# Patient Record
Sex: Female | Born: 1950
Health system: Southern US, Community
[De-identification: ages and names within clinical notes are randomized; demographics above are authoritative.]

## PROBLEM LIST (undated history)

## (undated) DIAGNOSIS — E669 Obesity, unspecified: Secondary | ICD-10-CM

## (undated) DIAGNOSIS — Z720 Tobacco use: Secondary | ICD-10-CM

## (undated) DIAGNOSIS — Z7189 Other specified counseling: Secondary | ICD-10-CM

## (undated) DIAGNOSIS — M549 Dorsalgia, unspecified: Secondary | ICD-10-CM

## (undated) DIAGNOSIS — K219 Gastro-esophageal reflux disease without esophagitis: Secondary | ICD-10-CM

## (undated) DIAGNOSIS — M4800 Spinal stenosis, site unspecified: Secondary | ICD-10-CM

## (undated) DIAGNOSIS — I878 Other specified disorders of veins: Secondary | ICD-10-CM

## (undated) DIAGNOSIS — R1310 Dysphagia, unspecified: Secondary | ICD-10-CM

## (undated) DIAGNOSIS — R51 Headache: Secondary | ICD-10-CM

## (undated) DIAGNOSIS — I2699 Other pulmonary embolism without acute cor pulmonale: Secondary | ICD-10-CM

## (undated) DIAGNOSIS — Z7901 Long term (current) use of anticoagulants: Secondary | ICD-10-CM

## (undated) DIAGNOSIS — C801 Malignant (primary) neoplasm, unspecified: Secondary | ICD-10-CM

## (undated) DIAGNOSIS — I480 Paroxysmal atrial fibrillation: Secondary | ICD-10-CM

## (undated) DIAGNOSIS — I82409 Acute embolism and thrombosis of unspecified deep veins of unspecified lower extremity: Secondary | ICD-10-CM

## (undated) DIAGNOSIS — R112 Nausea with vomiting, unspecified: Secondary | ICD-10-CM

## (undated) DIAGNOSIS — G4730 Sleep apnea, unspecified: Secondary | ICD-10-CM

## (undated) DIAGNOSIS — Z9889 Other specified postprocedural states: Secondary | ICD-10-CM

## (undated) DIAGNOSIS — F419 Anxiety disorder, unspecified: Secondary | ICD-10-CM

## (undated) DIAGNOSIS — M1990 Unspecified osteoarthritis, unspecified site: Secondary | ICD-10-CM

## (undated) DIAGNOSIS — IMO0001 Reserved for inherently not codable concepts without codable children: Secondary | ICD-10-CM

## (undated) DIAGNOSIS — G8929 Other chronic pain: Secondary | ICD-10-CM

## (undated) DIAGNOSIS — E2839 Other primary ovarian failure: Secondary | ICD-10-CM

## (undated) HISTORY — PX: TUBAL LIGATION: SHX77

## (undated) HISTORY — PX: APPENDECTOMY: SHX54

## (undated) HISTORY — DX: Dysphagia, unspecified: R13.10

## (undated) HISTORY — DX: Reserved for inherently not codable concepts without codable children: IMO0001

## (undated) HISTORY — DX: Other specified counseling: Z71.89

## (undated) HISTORY — DX: Headache: R51

## (undated) HISTORY — PX: CHOLECYSTECTOMY: SHX55

## (undated) HISTORY — DX: Gastro-esophageal reflux disease without esophagitis: K21.9

## (undated) HISTORY — PX: ROTATOR CUFF REPAIR: SHX139

## (undated) HISTORY — DX: Paroxysmal atrial fibrillation: I48.0

## (undated) HISTORY — PX: CARPAL TUNNEL RELEASE: SHX101

## (undated) HISTORY — DX: Other specified disorders of veins: I87.8

## (undated) HISTORY — PX: OVARIAN CYST REMOVAL: SHX89

## (undated) HISTORY — DX: Sleep apnea, unspecified: G47.30

## (undated) HISTORY — DX: Other primary ovarian failure: E28.39

## (undated) HISTORY — PX: ANKLE SURGERY: SHX546

---

## 1998-12-27 ENCOUNTER — Ambulatory Visit (HOSPITAL_COMMUNITY): Admission: RE | Admit: 1998-12-27 | Discharge: 1998-12-27 | Payer: Self-pay | Admitting: Orthopaedic Surgery

## 2000-10-19 ENCOUNTER — Ambulatory Visit (HOSPITAL_COMMUNITY): Admission: RE | Admit: 2000-10-19 | Discharge: 2000-10-19 | Payer: Self-pay | Admitting: Family Medicine

## 2000-10-19 ENCOUNTER — Encounter: Payer: Self-pay | Admitting: Family Medicine

## 2001-03-25 ENCOUNTER — Ambulatory Visit (HOSPITAL_COMMUNITY): Admission: RE | Admit: 2001-03-25 | Discharge: 2001-03-25 | Payer: Self-pay | Admitting: Family Medicine

## 2001-03-25 ENCOUNTER — Encounter: Payer: Self-pay | Admitting: Family Medicine

## 2001-04-08 ENCOUNTER — Ambulatory Visit (HOSPITAL_COMMUNITY): Admission: RE | Admit: 2001-04-08 | Discharge: 2001-04-08 | Payer: Self-pay | Admitting: Family Medicine

## 2001-04-08 ENCOUNTER — Encounter: Payer: Self-pay | Admitting: Family Medicine

## 2001-04-17 ENCOUNTER — Other Ambulatory Visit: Admission: RE | Admit: 2001-04-17 | Discharge: 2001-04-17 | Payer: Self-pay | Admitting: Radiology

## 2001-05-07 ENCOUNTER — Encounter: Admission: RE | Admit: 2001-05-07 | Discharge: 2001-05-07 | Payer: Self-pay | Admitting: General Surgery

## 2001-05-07 ENCOUNTER — Ambulatory Visit (HOSPITAL_BASED_OUTPATIENT_CLINIC_OR_DEPARTMENT_OTHER): Admission: RE | Admit: 2001-05-07 | Discharge: 2001-05-07 | Payer: Self-pay | Admitting: General Surgery

## 2001-05-07 ENCOUNTER — Encounter (INDEPENDENT_AMBULATORY_CARE_PROVIDER_SITE_OTHER): Payer: Self-pay | Admitting: *Deleted

## 2001-05-07 ENCOUNTER — Encounter: Payer: Self-pay | Admitting: General Surgery

## 2001-07-12 ENCOUNTER — Encounter (INDEPENDENT_AMBULATORY_CARE_PROVIDER_SITE_OTHER): Payer: Self-pay | Admitting: Specialist

## 2001-07-12 ENCOUNTER — Ambulatory Visit (HOSPITAL_BASED_OUTPATIENT_CLINIC_OR_DEPARTMENT_OTHER): Admission: RE | Admit: 2001-07-12 | Discharge: 2001-07-12 | Payer: Self-pay | Admitting: General Surgery

## 2001-09-12 ENCOUNTER — Encounter: Payer: Self-pay | Admitting: Family Medicine

## 2001-09-12 ENCOUNTER — Ambulatory Visit (HOSPITAL_COMMUNITY): Admission: RE | Admit: 2001-09-12 | Discharge: 2001-09-12 | Payer: Self-pay | Admitting: Family Medicine

## 2001-11-26 ENCOUNTER — Ambulatory Visit (HOSPITAL_COMMUNITY): Admission: RE | Admit: 2001-11-26 | Discharge: 2001-11-26 | Payer: Self-pay | Admitting: Internal Medicine

## 2001-12-11 ENCOUNTER — Encounter: Payer: Self-pay | Admitting: Orthopaedic Surgery

## 2001-12-11 ENCOUNTER — Ambulatory Visit (HOSPITAL_COMMUNITY): Admission: RE | Admit: 2001-12-11 | Discharge: 2001-12-11 | Payer: Self-pay | Admitting: Orthopaedic Surgery

## 2002-04-07 ENCOUNTER — Encounter: Payer: Self-pay | Admitting: Family Medicine

## 2002-04-07 ENCOUNTER — Ambulatory Visit (HOSPITAL_COMMUNITY): Admission: RE | Admit: 2002-04-07 | Discharge: 2002-04-07 | Payer: Self-pay | Admitting: Family Medicine

## 2003-01-22 ENCOUNTER — Ambulatory Visit (HOSPITAL_COMMUNITY): Admission: RE | Admit: 2003-01-22 | Discharge: 2003-01-22 | Payer: Self-pay | Admitting: Family Medicine

## 2003-01-22 ENCOUNTER — Encounter: Payer: Self-pay | Admitting: Family Medicine

## 2003-02-05 ENCOUNTER — Encounter (HOSPITAL_COMMUNITY): Admission: RE | Admit: 2003-02-05 | Discharge: 2003-03-07 | Payer: Self-pay | Admitting: Family Medicine

## 2003-05-08 ENCOUNTER — Ambulatory Visit (HOSPITAL_COMMUNITY): Admission: RE | Admit: 2003-05-08 | Discharge: 2003-05-08 | Payer: Self-pay | Admitting: Family Medicine

## 2004-10-04 ENCOUNTER — Ambulatory Visit (HOSPITAL_COMMUNITY): Admission: RE | Admit: 2004-10-04 | Discharge: 2004-10-04 | Payer: Self-pay | Admitting: Family Medicine

## 2005-06-21 ENCOUNTER — Ambulatory Visit (HOSPITAL_COMMUNITY): Admission: RE | Admit: 2005-06-21 | Discharge: 2005-06-21 | Payer: Self-pay | Admitting: Family Medicine

## 2006-06-15 ENCOUNTER — Encounter: Admission: RE | Admit: 2006-06-15 | Discharge: 2006-06-15 | Payer: Self-pay | Admitting: Orthopaedic Surgery

## 2006-10-03 ENCOUNTER — Encounter: Admission: RE | Admit: 2006-10-03 | Discharge: 2006-10-03 | Payer: Self-pay | Admitting: Orthopaedic Surgery

## 2006-10-19 ENCOUNTER — Encounter: Admission: RE | Admit: 2006-10-19 | Discharge: 2006-10-19 | Payer: Self-pay | Admitting: Orthopaedic Surgery

## 2006-10-30 ENCOUNTER — Ambulatory Visit (HOSPITAL_COMMUNITY): Admission: RE | Admit: 2006-10-30 | Discharge: 2006-10-30 | Payer: Self-pay | Admitting: Family Medicine

## 2006-11-12 ENCOUNTER — Inpatient Hospital Stay (HOSPITAL_COMMUNITY): Admission: RE | Admit: 2006-11-12 | Discharge: 2006-11-16 | Payer: Self-pay | Admitting: Family Medicine

## 2006-12-11 ENCOUNTER — Ambulatory Visit (HOSPITAL_COMMUNITY): Payer: Self-pay | Admitting: Oncology

## 2007-07-17 ENCOUNTER — Ambulatory Visit (HOSPITAL_COMMUNITY): Admission: RE | Admit: 2007-07-17 | Discharge: 2007-07-17 | Payer: Self-pay | Admitting: Otolaryngology

## 2007-10-09 ENCOUNTER — Ambulatory Visit (HOSPITAL_COMMUNITY): Admission: RE | Admit: 2007-10-09 | Discharge: 2007-10-09 | Payer: Self-pay | Admitting: Family Medicine

## 2007-11-15 ENCOUNTER — Ambulatory Visit (HOSPITAL_COMMUNITY): Admission: RE | Admit: 2007-11-15 | Discharge: 2007-11-15 | Payer: Self-pay | Admitting: Family Medicine

## 2010-09-13 NOTE — H&P (Signed)
Sarah [name], Sarah [name]               ACCOUNT NO.:  [id number]   MEDICAL RECORD NO.:  [id number]          PATIENT TYPE:  INP   LOCATION:  A206                          FACILITY:  [facility]   PHYSICIAN:  Donna Bernard, M.D.DATE OF BIRTH:  [birth year]   DATE OF ADMISSION:  11/12/2006  DATE OF DISCHARGE:  LH                              HISTORY & PHYSICAL   CHIEF COMPLAINT:  Leg pain.   SUBJECTIVE:  This patient is a 60 year old white female with a history  of arthritis, chronic left leg venous stasis, reflux, old DVT  progressing to pulmonary embolus at age 78, who presents to the office  the day of admission with ongoing leg symptoms.  The patient was seen a  couple weeks prior.  She had left leg swelling and tenderness at that  time.  A D-dimer was elevated.  The patient had an ultrasound done.  This showed no evidence of clot.  She was placed on antibiotics to cover  for the potential for cellulitis.  The patient returns to the office  today, states she is having further discomfort, the swelling does not  seem to be improved at all.  She has finished her course of antibiotics.  No chest pain, no shortness of breath, no cough.  The patient claims  compliance with all her medicines which include:  1. Darvocet one b.i.d. p.r.n. for pain.  2. Vicodin one q.6h. p.r.n. for pain.  3. Nexium 40 mg daily.  4. Relafen 750 one b.i.d.  5. Wellbutrin SR 150 one b.i.d.  6. Tums p.r.n.   PRIOR MEDICAL HISTORY:  Significant for chronic problems as noted above  plus reflux.   PRIOR SURGERIES:  1. Remote cholecystectomy.  2. Remote appendectomy.  3. Multiple ankle surgeries.   FAMILY HISTORY:  Positive for colon cancer, coronary artery disease.   ALLERGIES:  None.   SOCIAL:  The patient is a dialysis tech.  Does smoke approximately half  a pack a day.  No alcohol abuse.  Two children.   REVIEW OF SYSTEMS:  Otherwise negative.   BP 130/60.  Alert, no acute distress.  HEENT:  Normal.  NECK:   Supple.  LUNGS:  Clear.  No tachypnea.  HEART:  Regular rate and rhythm.  ABDOMEN:  Soft.  No tenderness, no rebound, no guarding.  LEFT LEG:  Impressive swelling, some distal erythema, moderate  tenderness posteriorly.  Denna Haggard' sign compromised by chronic ankle  tightness due to old fracture.   SIGNIFICANT LABORATORIES:  CBC, liver enzymes, MET-7, INR all normal.  Ultrasound:  DVT extending to midthigh.   IMPRESSION:  Deep venous thrombosis, progressive in nature, with history  of prior pulmonary embolus and likely hypercoagulable tendencies with  history of smoking, etcetera, along with prior deep venous thrombosis  many years ago.  I think the patient warrants inpatient management.   PLAN:  Lovenox injections, initiate Coumadin, strict bedrest, elevate  the leg.  Further orders as noted in the chart.      Donna Bernard, M.D.  Electronically Signed     WSL/MEDQ  D:  11/13/2006  T:  11/13/2006  Job:  [id number]

## 2010-09-13 NOTE — Discharge Summary (Signed)
NAMEHEIDE, Sarah [name]               ACCOUNT NO.:  [id number]   MEDICAL RECORD NO.:  [id number]           PATIENT TYPE:  INP   LOCATION:  A206                          FACILITY:  [facility]   PHYSICIAN:  Donna Bernard, M.D.DATE OF BIRTH:  [birth year]   DATE OF ADMISSION:  11/12/2006  DATE OF DISCHARGE:  07/18/2008LH                               DISCHARGE SUMMARY   FINAL DIAGNOSES:  1. Deep venous thrombosis.  2. History of pulmonary embolus.  3. Chronic arthritis.  4. Reflux.   FINAL DISPOSITION:  1. Patient discharged to home.  2. Maintain usual chronic medicines plus Coumadin 5 mg 1/2 daily.  3. Lovenox injection this evening.  4. Return to the office in several days for reevaluation and check of      INR.   INITIAL HISTORY AND PHYSICAL:  Please see H&P as dictated.   HOSPITAL COURSE:  This patient is a 60 year old white female with  history of prior pulmonary embolus who presented to the office the day  of admission with persistent leg swelling.  She had been seen a couple  weeks previous, had an elevated D-dimer at that time and negative  ultrasound.  The patient had ongoing pain and swelling.  We did the  proper studies, and DVT was evident.  The patient was admitted to  hospital.  She was started on Lovenox subcutaneous at appropriate doses.  Coumadin was also initiated.  Over the next several days with leg  elevation, the patient noted diminishment in overall swelling.  She  continued to improve in terms of discomfort in her leg.  The day of  discharge, the patient was approaching therapeutic level.  Therefore,  she was sent home with diagnosis and disposition as noted above.      Donna Bernard, M.D.  Electronically Signed     WSL/MEDQ  D:  12/19/2006  T:  12/20/2006  Job:  [id number]

## 2010-09-16 NOTE — Op Note (Signed)
[place]. [facility]  Patient:    Sarah [name], Sarah [name] Visit Number: [id number] MRN: [id number]          Service Type: OUT Location: RAD Attending Physician:  Harlow Asa Dictated by:   Lorne Skeens Hoxworth, M.D. Proc. Date: 07/12/01                             Operative Report  PREOPERATIVE DIAGNOSES:  Recurrent areolar abscess and skin sinus.  POSTOPERATIVE DIAGNOSES:  Recurrent areolar abscess and skin sinus.  SURGICAL PROCEDURE:  Excision of chronic areolar abscess.  SURGEON:  Lorne Skeens. Hoxworth, M.D.  ANESTHESIA:  Local with IV sedation.  BRIEF HISTORY:  [name] is a 60 year old white female who has undergone I&D and debridement of an areolar abscess about two months ago. She now has presented with recurrent abscess requiring I&D in the office and has a small draining sinus at the inferior aspect of the left areolar. Excision of this area has been recommended and accepted. The nature of the procedure, indications, and risk of bleeding, infection and need to sacrifice some areolar skin have been discussed and understood. She is now brought to the operating room for this procedure.  DESCRIPTION OF PROCEDURE:  The patient was brought to the operating room, placed in supine position on the operating table and IV sedation was administered. She received preoperative antibiotics. The left breast was sterilely prepped and draped. In the inferior aspect of the left areolar just underneath the nipple was a chronic small draining sinus tract. The skin underlying tissue and nipple areolar complex were anesthetized. A curvilinear excision of the sinus tract back to healthy appearing skin up to the edge of the nipple and down toward the bottom of the areolar was performed. Dissection was deepened into the subcutaneous tissue. Dissection was carried down sharply into normal soft tissue deeply. Superiorly the nipple was undermined slightly and the ducts  taken up towards the nipple as well. This appeared to remove all abnormal tissue. The cavity was irrigated with antibiotic solution. The skin was then closed with interrupted 4-0 nylon mattress sutures. Sponge, needle and instrument counts were correct. Dry sterile dressing was applied and the patient taken to the recovery room in good condition. Dictated by:   Lorne Skeens. Hoxworth, M.D. Attending Physician:  Harlow Asa DD:  07/12/01 TD:  07/15/01 Job: [id number]

## 2010-09-16 NOTE — Op Note (Signed)
   NAME:  Sarah [name], Sarah [name]                         ACCOUNT NO.:  [id number]   MEDICAL RECORD NO.:  [id number]                   PATIENT TYPE:  AMB   LOCATION:  DAY                                  FACILITY:  [facility]   PHYSICIAN:  Gerrit Friends. Rourk, M.D.               DATE OF BIRTH:  [birth year]   DATE OF PROCEDURE:  11/26/2001  DATE OF DISCHARGE:  11/26/2001                                 OPERATIVE REPORT   PROCEDURE:  Screening colonoscopy.   INDICATIONS FOR PROCEDURE:  The patient is a 60 year old lady devoid of any  lower GI tract symptoms whose mother had colorectal cancer.  She never had a  lower GI tract imaging.  Colonoscopy is now being done to further evaluate  her symptoms.  This approach has been discussed with the patient at length.  Potential risks, benefits, and alternatives have been explained and  questions answered.  She is agreeable.  Please see my handwritten H&P for  more information.   DESCRIPTION OF PROCEDURE:  Oxygen saturation, blood pressure, pulse were  monitored during the entire procedure.  Conscious sedation of Versed 2 mg,  IV Demerol 50 mg IV.  The instrument Olympus pediatric colonoscope.  Findings:  Digital rectal examination revealed no abnormalities.  Endoscopic  findings:  The prep was good.  Rectum:  Examination of the rectal mucosa  including retroflexed view in the anal verge revealed no abnormalities.   Colon:  Colonic mucosa was surveyed from the rectosigmoid junction through  the left, transverse, right colon, appendiceal orifice, ileocecal valve, and  cecum.  These structures were well seen and photographed.  The colonic  mucosa in the sigmoid appeared normal from the level of the cecum and  ileocecal valve.  The scope was slowly withdrawn.  All previously mentioned  mucosal surfaces were again seen, and again no abnormalities were observed.  The patient tolerated the procedure well __________ .   IMPRESSION:  Normal rectum, normal colon.   Repeat colonoscopy in five years.                                               Gerrit Friends. Rourk, M.D.    RMR/MEDQ  D:  11/26/2001  T:  11/28/2001  Job:  [id number]   cc:   Gerrit Friends. Rourk, M.D.

## 2010-09-16 NOTE — Op Note (Signed)
[place]. [facility]  Patient:    Sarah [name], Sarah [name] Visit Number: [id number] MRN: [id number]          Service Type: DSU Location: [facility] Attending Physician:  Delsa Bern Dictated by:   Lorne Skeens. Hoxworth, M.D. Proc. Date: 05/07/01 Admit Date:  05/07/2001                             Operative Report  PREOPERATIVE DIAGNOSIS:  Chronic breast abscess.  POSTOPERATIVE DIAGNOSIS:  Chronic breast abscess.  OPERATION PERFORMED:  Excision of left breast abscess.  SURGEON:  Lorne Skeens. Hoxworth, M.D.  ANESTHESIA:  General.  INDICATIONS FOR PROCEDURE:  The patient is a 60 year old white female who has had an area of tenderness and a palpable lump and intermittent erythema in the left breast around the areolar border at the 5 oclock position.  Imaging studies and fine needle aspiration has been performed which is consistent with abscess.  It has not responded well to antibiotics.  Excision of this area has been recommended and accepted.  The nature of the procedure, its indications and risks of bleeding, infection and recurrent abscess were discussed and understood.  Patient is now brought to the operating room for this procedure.  DESCRIPTION OF PROCEDURE:  The patient was brought to the operating room and placed in supine position on the operating table and general endotracheal anesthesia was induced.  The left breast was sterilely prepped and draped. There was about a 1.5 cm palpable slightly fluctuant mass just inside the areolar edge at the 5 oclock position of the left breast.  There was a small skin opening overlying this in the areola.  I made a curvilinear incision directly over the palpable area and encompassed the thinned skin overlying it with the opening in the excision.  Dissection was carried down to the subcutaneous tissues.  The abscess cavity was entered and was cultured.  The small overlying skin and the underlying abscess cavity  was then completely excised down into healthy-appearing subcutaneous tissues sharply and with cautery.  Hemostasis was obtained with cautery.  Either end of the incision was closed with a single 5-0 nylon suture.  The central portion of the incision was left open and the cavity packed with moist saline gauze.  The soft tissue had been infiltrated with Marcaine.  Sponge, needle and instrument counts were correct.  Dry sterile dressings were applied.  The patient was transferred to the recovery room in good condition.. Dictated by:   Lorne Skeens. Hoxworth, M.D. Attending Physician:  Delsa Bern DD:  05/07/01 TD:  05/07/01 Job: [id number]

## 2010-10-03 ENCOUNTER — Other Ambulatory Visit: Payer: Self-pay | Admitting: Family Medicine

## 2010-10-03 DIAGNOSIS — M5430 Sciatica, unspecified side: Secondary | ICD-10-CM

## 2010-10-07 ENCOUNTER — Ambulatory Visit (HOSPITAL_COMMUNITY)
Admission: RE | Admit: 2010-10-07 | Discharge: 2010-10-07 | Disposition: A | Discharge status: Home / Self Care | Payer: Medicare Other | Source: Ambulatory Visit | Attending: Family Medicine | Admitting: Family Medicine

## 2010-10-07 DIAGNOSIS — M5126 Other intervertebral disc displacement, lumbar region: Secondary | ICD-10-CM | POA: Insufficient documentation

## 2010-10-07 DIAGNOSIS — M79609 Pain in unspecified limb: Secondary | ICD-10-CM | POA: Insufficient documentation

## 2010-10-07 DIAGNOSIS — M545 Low back pain, unspecified: Secondary | ICD-10-CM | POA: Insufficient documentation

## 2010-10-07 DIAGNOSIS — M5430 Sciatica, unspecified side: Secondary | ICD-10-CM

## 2010-11-17 ENCOUNTER — Other Ambulatory Visit: Payer: Self-pay | Admitting: Neurosurgery

## 2010-11-17 DIAGNOSIS — M549 Dorsalgia, unspecified: Secondary | ICD-10-CM

## 2010-11-17 DIAGNOSIS — M79606 Pain in leg, unspecified: Secondary | ICD-10-CM

## 2010-11-23 ENCOUNTER — Ambulatory Visit
Admission: RE | Admit: 2010-11-23 | Discharge: 2010-11-23 | Disposition: A | Discharge status: Home / Self Care | Payer: Medicare Other | Source: Ambulatory Visit | Attending: Neurosurgery | Admitting: Neurosurgery

## 2010-11-23 DIAGNOSIS — M549 Dorsalgia, unspecified: Secondary | ICD-10-CM

## 2010-11-23 DIAGNOSIS — M79606 Pain in leg, unspecified: Secondary | ICD-10-CM

## 2010-11-23 MED ORDER — IOHEXOL 180 MG/ML  SOLN
1.0000 mL | Freq: Once | INTRAMUSCULAR | Status: AC | PRN
  Administered 2010-11-23: 1 mL via EPIDURAL

## 2010-11-23 MED ORDER — METHYLPREDNISOLONE ACETATE 40 MG/ML INJ SUSP (RADIOLOG
120.0000 mg | Freq: Once | INTRAMUSCULAR | Status: AC
  Administered 2010-11-23: 120 mg via EPIDURAL

## 2011-01-20 ENCOUNTER — Ambulatory Visit (HOSPITAL_COMMUNITY)
Admission: RE | Admit: 2011-01-20 | Discharge: 2011-01-20 | Disposition: A | Discharge status: Home / Self Care | Payer: Medicare Other | Source: Ambulatory Visit | Attending: Family Medicine | Admitting: Family Medicine

## 2011-01-20 ENCOUNTER — Other Ambulatory Visit: Payer: Self-pay | Admitting: Family Medicine

## 2011-01-20 DIAGNOSIS — M79606 Pain in leg, unspecified: Secondary | ICD-10-CM

## 2011-01-20 DIAGNOSIS — M7989 Other specified soft tissue disorders: Secondary | ICD-10-CM | POA: Insufficient documentation

## 2011-01-20 DIAGNOSIS — M79609 Pain in unspecified limb: Secondary | ICD-10-CM | POA: Insufficient documentation

## 2011-02-13 LAB — CBC
Hemoglobin: 13.3
MCHC: 34.2
RBC: 4.04
RDW: 13.4

## 2011-02-13 LAB — DIFFERENTIAL
Basophils Absolute: 0
Basophils Relative: 0
Lymphocytes Relative: 42
Neutro Abs: 2.3
Neutrophils Relative %: 48

## 2011-02-13 LAB — PROTIME-INR
INR: 0.9
INR: 1
INR: 1.2
INR: 1.7 — ABNORMAL HIGH
Prothrombin Time: 13.1
Prothrombin Time: 20.7 — ABNORMAL HIGH

## 2011-02-14 LAB — BASIC METABOLIC PANEL
BUN: 7
Calcium: 9
Creatinine, Ser: 0.82
GFR calc non Af Amer: >60
Glucose, Bld: 89

## 2011-02-14 LAB — HEPATIC FUNCTION PANEL
ALT: 24
Albumin: 3.6
Total Protein: 6.1

## 2011-02-14 LAB — DIFFERENTIAL
Basophils Absolute: 0
Eosinophils Relative: 1
Lymphocytes Relative: 28
Neutro Abs: 3.8
Neutrophils Relative %: 63

## 2011-02-14 LAB — CBC
HCT: 37.7
Platelets: 200
RDW: 13.5
WBC: 6

## 2011-05-09 DIAGNOSIS — I2699 Other pulmonary embolism without acute cor pulmonale: Secondary | ICD-10-CM | POA: Diagnosis not present

## 2011-05-09 DIAGNOSIS — I82409 Acute embolism and thrombosis of unspecified deep veins of unspecified lower extremity: Secondary | ICD-10-CM | POA: Diagnosis not present

## 2011-05-17 DIAGNOSIS — M5126 Other intervertebral disc displacement, lumbar region: Secondary | ICD-10-CM | POA: Diagnosis not present

## 2011-06-08 DIAGNOSIS — I824Z9 Acute embolism and thrombosis of unspecified deep veins of unspecified distal lower extremity: Secondary | ICD-10-CM | POA: Diagnosis not present

## 2011-07-06 DIAGNOSIS — I824Z9 Acute embolism and thrombosis of unspecified deep veins of unspecified distal lower extremity: Secondary | ICD-10-CM | POA: Diagnosis not present

## 2011-08-09 DIAGNOSIS — L570 Actinic keratosis: Secondary | ICD-10-CM | POA: Diagnosis not present

## 2011-08-09 DIAGNOSIS — I82409 Acute embolism and thrombosis of unspecified deep veins of unspecified lower extremity: Secondary | ICD-10-CM | POA: Diagnosis not present

## 2011-08-09 DIAGNOSIS — D235 Other benign neoplasm of skin of trunk: Secondary | ICD-10-CM | POA: Diagnosis not present

## 2011-08-09 DIAGNOSIS — D689 Coagulation defect, unspecified: Secondary | ICD-10-CM | POA: Diagnosis not present

## 2011-09-19 DIAGNOSIS — I82409 Acute embolism and thrombosis of unspecified deep veins of unspecified lower extremity: Secondary | ICD-10-CM | POA: Diagnosis not present

## 2011-10-26 DIAGNOSIS — I83009 Varicose veins of unspecified lower extremity with ulcer of unspecified site: Secondary | ICD-10-CM | POA: Diagnosis not present

## 2011-10-26 DIAGNOSIS — L97909 Non-pressure chronic ulcer of unspecified part of unspecified lower leg with unspecified severity: Secondary | ICD-10-CM | POA: Diagnosis not present

## 2011-10-26 DIAGNOSIS — G8929 Other chronic pain: Secondary | ICD-10-CM | POA: Diagnosis not present

## 2011-10-26 DIAGNOSIS — I82409 Acute embolism and thrombosis of unspecified deep veins of unspecified lower extremity: Secondary | ICD-10-CM | POA: Diagnosis not present

## 2011-10-26 DIAGNOSIS — M129 Arthropathy, unspecified: Secondary | ICD-10-CM | POA: Diagnosis not present

## 2011-10-26 DIAGNOSIS — K219 Gastro-esophageal reflux disease without esophagitis: Secondary | ICD-10-CM | POA: Diagnosis not present

## 2011-11-07 DIAGNOSIS — R7301 Impaired fasting glucose: Secondary | ICD-10-CM | POA: Diagnosis not present

## 2011-11-07 DIAGNOSIS — Z0000 Encounter for general adult medical examination without abnormal findings: Secondary | ICD-10-CM | POA: Diagnosis not present

## 2011-11-07 DIAGNOSIS — R5383 Other fatigue: Secondary | ICD-10-CM | POA: Diagnosis not present

## 2011-11-07 DIAGNOSIS — R5381 Other malaise: Secondary | ICD-10-CM | POA: Diagnosis not present

## 2011-11-27 DIAGNOSIS — I82409 Acute embolism and thrombosis of unspecified deep veins of unspecified lower extremity: Secondary | ICD-10-CM | POA: Diagnosis not present

## 2011-12-28 ENCOUNTER — Inpatient Hospital Stay (HOSPITAL_COMMUNITY)
Admission: EM | Admit: 2011-12-28 | Discharge: 2011-12-29 | DRG: 309 | Disposition: A | Discharge status: Home / Self Care | Payer: Medicare Other | Attending: Internal Medicine | Admitting: Internal Medicine

## 2011-12-28 ENCOUNTER — Encounter (HOSPITAL_COMMUNITY): Payer: Self-pay | Admitting: Emergency Medicine

## 2011-12-28 ENCOUNTER — Emergency Department (HOSPITAL_COMMUNITY): Payer: Medicare Other

## 2011-12-28 DIAGNOSIS — Z79899 Other long term (current) drug therapy: Secondary | ICD-10-CM | POA: Diagnosis not present

## 2011-12-28 DIAGNOSIS — K219 Gastro-esophageal reflux disease without esophagitis: Secondary | ICD-10-CM | POA: Diagnosis not present

## 2011-12-28 DIAGNOSIS — I4891 Unspecified atrial fibrillation: Principal | ICD-10-CM

## 2011-12-28 DIAGNOSIS — M129 Arthropathy, unspecified: Secondary | ICD-10-CM | POA: Diagnosis present

## 2011-12-28 DIAGNOSIS — Z86711 Personal history of pulmonary embolism: Secondary | ICD-10-CM | POA: Diagnosis not present

## 2011-12-28 DIAGNOSIS — Z86718 Personal history of other venous thrombosis and embolism: Secondary | ICD-10-CM | POA: Diagnosis not present

## 2011-12-28 DIAGNOSIS — I8390 Asymptomatic varicose veins of unspecified lower extremity: Secondary | ICD-10-CM | POA: Diagnosis present

## 2011-12-28 DIAGNOSIS — F17200 Nicotine dependence, unspecified, uncomplicated: Secondary | ICD-10-CM | POA: Diagnosis not present

## 2011-12-28 DIAGNOSIS — R072 Precordial pain: Secondary | ICD-10-CM | POA: Diagnosis present

## 2011-12-28 DIAGNOSIS — Z7901 Long term (current) use of anticoagulants: Secondary | ICD-10-CM

## 2011-12-28 DIAGNOSIS — Z6841 Body Mass Index (BMI) 40.0 and over, adult: Secondary | ICD-10-CM

## 2011-12-28 DIAGNOSIS — E669 Obesity, unspecified: Secondary | ICD-10-CM | POA: Diagnosis present

## 2011-12-28 DIAGNOSIS — F419 Anxiety disorder, unspecified: Secondary | ICD-10-CM

## 2011-12-28 DIAGNOSIS — Z720 Tobacco use: Secondary | ICD-10-CM

## 2011-12-28 DIAGNOSIS — R7989 Other specified abnormal findings of blood chemistry: Secondary | ICD-10-CM | POA: Diagnosis not present

## 2011-12-28 DIAGNOSIS — R079 Chest pain, unspecified: Secondary | ICD-10-CM | POA: Diagnosis not present

## 2011-12-28 DIAGNOSIS — F411 Generalized anxiety disorder: Secondary | ICD-10-CM | POA: Diagnosis not present

## 2011-12-28 DIAGNOSIS — Z23 Encounter for immunization: Secondary | ICD-10-CM

## 2011-12-28 DIAGNOSIS — M4800 Spinal stenosis, site unspecified: Secondary | ICD-10-CM | POA: Diagnosis present

## 2011-12-28 DIAGNOSIS — R000 Tachycardia, unspecified: Secondary | ICD-10-CM | POA: Diagnosis not present

## 2011-12-28 DIAGNOSIS — I517 Cardiomegaly: Secondary | ICD-10-CM | POA: Diagnosis not present

## 2011-12-28 DIAGNOSIS — I499 Cardiac arrhythmia, unspecified: Secondary | ICD-10-CM | POA: Diagnosis not present

## 2011-12-28 DIAGNOSIS — R0789 Other chest pain: Secondary | ICD-10-CM

## 2011-12-28 DIAGNOSIS — I82409 Acute embolism and thrombosis of unspecified deep veins of unspecified lower extremity: Secondary | ICD-10-CM | POA: Diagnosis not present

## 2011-12-28 HISTORY — DX: Tobacco use: Z72.0

## 2011-12-28 HISTORY — DX: Unspecified osteoarthritis, unspecified site: M19.90

## 2011-12-28 HISTORY — DX: Anxiety disorder, unspecified: F41.9

## 2011-12-28 HISTORY — DX: Spinal stenosis, site unspecified: M48.00

## 2011-12-28 HISTORY — DX: Acute embolism and thrombosis of unspecified deep veins of unspecified lower extremity: I82.409

## 2011-12-28 HISTORY — DX: Long term (current) use of anticoagulants: Z79.01

## 2011-12-28 HISTORY — DX: Other pulmonary embolism without acute cor pulmonale: I26.99

## 2011-12-28 HISTORY — DX: Gastro-esophageal reflux disease without esophagitis: K21.9

## 2011-12-28 HISTORY — DX: Dorsalgia, unspecified: M54.9

## 2011-12-28 HISTORY — DX: Other chronic pain: G89.29

## 2011-12-28 HISTORY — DX: Other specified disorders of veins: I87.8

## 2011-12-28 LAB — CBC WITH DIFFERENTIAL/PLATELET
Basophils Relative: 0 % (ref 0–1)
Eosinophils Absolute: 0.1 10*3/uL (ref 0.0–0.7)
Eosinophils Relative: 1 % (ref 0–5)
HCT: 44.7 % (ref 36.0–46.0)
Hemoglobin: 14.4 g/dL (ref 12.0–15.0)
Lymphs Abs: 1.6 10*3/uL (ref 0.7–4.0)
MCH: 30.9 pg (ref 26.0–34.0)
MCHC: 32.2 g/dL (ref 30.0–36.0)
MCV: 95.9 fL (ref 78.0–100.0)
Monocytes Absolute: 0.5 10*3/uL (ref 0.1–1.0)
Monocytes Relative: 6 % (ref 3–12)
Neutrophils Relative %: 71 % (ref 43–77)
RBC: 4.66 MIL/uL (ref 3.87–5.11)

## 2011-12-28 LAB — BASIC METABOLIC PANEL
BUN: 10 mg/dL (ref 6–23)
Creatinine, Ser: 1.08 mg/dL (ref 0.50–1.10)
GFR calc non Af Amer: 55 mL/min — ABNORMAL LOW (ref >90)
Glucose, Bld: 126 mg/dL — ABNORMAL HIGH (ref 70–99)
Potassium: 4.7 mEq/L (ref 3.5–5.1)

## 2011-12-28 LAB — CK TOTAL AND CKMB (NOT AT ARMC)
CK, MB: 2.3 ng/mL (ref 0.3–4.0)
Relative Index: INVALID (ref 0.0–2.5)
Total CK: 56 U/L (ref 7–177)

## 2011-12-28 LAB — MRSA PCR SCREENING: MRSA by PCR: NEGATIVE

## 2011-12-28 LAB — TROPONIN I: Troponin I: 0.34 ng/mL (ref <0.30)

## 2011-12-28 LAB — MAGNESIUM: Magnesium: 2.1 mg/dL (ref 1.5–2.5)

## 2011-12-28 LAB — PROTIME-INR: INR: 2.15 — ABNORMAL HIGH (ref 0.00–1.49)

## 2011-12-28 MED ORDER — PNEUMOCOCCAL VAC POLYVALENT 25 MCG/0.5ML IJ INJ
0.5000 mL | INJECTION | INTRAMUSCULAR | Status: AC
  Administered 2011-12-29: 0.5 mL via INTRAMUSCULAR
  Filled 2011-12-28: qty 0.5

## 2011-12-28 MED ORDER — DILTIAZEM HCL 100 MG IV SOLR
5.0000 mg/h | INTRAVENOUS | Status: DC
  Administered 2011-12-29: 5 mg/h via INTRAVENOUS
  Filled 2011-12-28: qty 100

## 2011-12-28 MED ORDER — DILTIAZEM HCL 100 MG IV SOLR
5.0000 mg/h | Freq: Once | INTRAVENOUS | Status: AC
  Administered 2011-12-28: 5 mg/h via INTRAVENOUS
  Filled 2011-12-28: qty 100

## 2011-12-28 MED ORDER — HYDROCODONE-ACETAMINOPHEN 5-325 MG PO TABS: 1.0000 | ORAL_TABLET | ORAL | Status: DC | PRN

## 2011-12-28 MED ORDER — POTASSIUM CHLORIDE IN NACL 20-0.9 MEQ/L-% IV SOLN
INTRAVENOUS | Status: DC
  Administered 2011-12-28: 19:00:00 via INTRAVENOUS

## 2011-12-28 MED ORDER — SODIUM CHLORIDE 0.9 % IV BOLUS (SEPSIS)
250.0000 mL | Freq: Once | INTRAVENOUS | Status: AC
  Administered 2011-12-28: 250 mL via INTRAVENOUS

## 2011-12-28 MED ORDER — NICOTINE 21 MG/24HR TD PT24
21.0000 mg | MEDICATED_PATCH | Freq: Every day | TRANSDERMAL | Status: DC
  Administered 2011-12-28 – 2011-12-29 (×2): 21 mg via TRANSDERMAL
  Filled 2011-12-28 (×2): qty 1

## 2011-12-28 MED ORDER — WARFARIN SODIUM 2.5 MG PO TABS: 2.5000 mg | ORAL_TABLET | ORAL | Status: DC

## 2011-12-28 MED ORDER — ACETAMINOPHEN 325 MG PO TABS: 650.0000 mg | ORAL_TABLET | Freq: Four times a day (QID) | ORAL | Status: DC | PRN

## 2011-12-28 MED ORDER — WARFARIN SODIUM 5 MG PO TABS
5.0000 mg | ORAL_TABLET | Freq: Once | ORAL | Status: AC
  Administered 2011-12-28: 5 mg via ORAL
  Filled 2011-12-28: qty 1

## 2011-12-28 MED ORDER — ONDANSETRON HCL 4 MG PO TABS: 4.0000 mg | ORAL_TABLET | Freq: Four times a day (QID) | ORAL | Status: DC | PRN

## 2011-12-28 MED ORDER — WARFARIN - PHARMACIST DOSING INPATIENT: Freq: Every day | Status: DC

## 2011-12-28 MED ORDER — PANTOPRAZOLE SODIUM 40 MG PO TBEC
40.0000 mg | DELAYED_RELEASE_TABLET | Freq: Every evening | ORAL | Status: DC
Start: 2011-12-28 — End: 2011-12-29
  Administered 2011-12-28: 40 mg via ORAL
  Filled 2011-12-28: qty 1

## 2011-12-28 MED ORDER — MORPHINE SULFATE 4 MG/ML IJ SOLN: 4.0000 mg | INTRAMUSCULAR | Status: DC | PRN

## 2011-12-28 MED ORDER — BUPROPION HCL ER (SR) 150 MG PO TB12
150.0000 mg | ORAL_TABLET | Freq: Two times a day (BID) | ORAL | Status: DC
  Administered 2011-12-28 – 2011-12-29 (×2): 150 mg via ORAL
  Filled 2011-12-28 (×8): qty 1

## 2011-12-28 MED ORDER — BUPROPION HCL ER (SR) 150 MG PO TB12
ORAL_TABLET | ORAL | Status: AC
  Filled 2011-12-28: qty 1

## 2011-12-28 MED ORDER — ONDANSETRON HCL 4 MG/2ML IJ SOLN: 4.0000 mg | Freq: Four times a day (QID) | INTRAMUSCULAR | Status: DC | PRN

## 2011-12-28 MED ORDER — LEVALBUTEROL HCL 0.63 MG/3ML IN NEBU: 0.6300 mg | INHALATION_SOLUTION | Freq: Four times a day (QID) | RESPIRATORY_TRACT | Status: DC | PRN

## 2011-12-28 MED ORDER — ACETAMINOPHEN 650 MG RE SUPP: 650.0000 mg | Freq: Four times a day (QID) | RECTAL | Status: DC | PRN

## 2011-12-28 MED ORDER — ALUM & MAG HYDROXIDE-SIMETH 200-200-20 MG/5ML PO SUSP: 30.0000 mL | Freq: Four times a day (QID) | ORAL | Status: DC | PRN

## 2011-12-28 NOTE — Progress Notes (Signed)
Paged MD critical troponin 0.34. No new orders received at this time.

## 2011-12-28 NOTE — ED Notes (Signed)
Report given to Pam, RN ICU. Ready to receive patient.  

## 2011-12-28 NOTE — H&P (Signed)
[facility] History and Physical  Sarah [name]:[id number] DOB: [birth year] DOA: 12/28/2011  Referring physician: Dr. Clarene Duke PCP: Harlow Asa, MD   Chief Complaint: Chest pain.  HPI: Sarah [name] is a 61 y.o. female with a history significant for recurrent left lower extremity DVT, pulmonary embolism, chronic warfarin therapy and spinal stenosis, who presents to the emergency department today with a chief complaint of chest pain. It started at approximately 12:45 PM while she was putting away groceries. She had been on the go all morning. She describes the pain as heartburn-like. It is located mid sternally. At the time that it started, it was moderate in intensity. It was associated with nausea but no vomiting. She also had lightheadedness. No associated diaphoresis, pleurisy, radiation of the pain, or palpitations. She is a former EMT. She checked her heart rate, and it was too rapid and too irregular for her to count. EMS was called. When they arrived, her heart rate was greater than 200 beats per minute and irregular.  The EMT gave her adenosine x3 which decreased her heart rate transiently. Finally, she was given 20 mg of diltiazem in route, which decreased her heart rate to the 130s. When she arrived to the emergency department, her heart rate was in the 130s to 160s. Currently, she has no complaints of chest pain or shortness of breath. She has no prior history of atrial fibrillation.  Her lab data are significant for a glucose of 126 (nonfasting), INR 2.15, and normal electrolytes. EKG #1 reveals a heart rate of 140 beats per minute and atrial fibrillation. EKG #2, following a bolus of Cardizem, reveals normal sinus rhythm with a heart rate of 66 beats per minute. She is being admitted for further evaluation and management.  Review of Systems: Chronic swelling in the left ankle, bilateral lower extremity varicose veins, occasional wheezing and arthritic pain. Otherwise review of  systems is negative.  Past Medical History  Diagnosis Date  . DVT (deep venous thrombosis)   . GERD (gastroesophageal reflux disease)   . Arthritis   . Lower extremity venous stasis     LLE, chronic  . Pulmonary embolism   . Spinal stenosis   . Chronic back pain   . Warfarin anticoagulation   . Chronic anxiety     Patient describes as stress  . Tobacco abuse    Past Surgical History  Procedure Date  . Ankle surgery     x2  . Cholecystectomy   . Carpal tunnel release   . Ovarian cyst removal   . Rotator cuff repair   . Tubal ligation   . Appendectomy    Social History: She is married. She has 2 children. She is a retired Museum/gallery exhibitions officer. She smokes cigarettes, one pack or more per day. She has been doing so for orders and 47 years. She denies alcohol and illicit drug use.  Allergies  Allergen Reactions  . Aspirin Nausea And Vomiting   Family history: Her mother died of colon cancer. Her father died of a stroke.  Prior to Admission medications   Medication Sig Start Date End Date Taking? Authorizing Provider  buPROPion (WELLBUTRIN SR) 150 MG 12 hr tablet Take 150 mg by mouth 2 (two) times daily.   Yes Historical Provider, MD  hydrocodone-acetaminophen (LORCET-HD) 5-500 MG per capsule Take 1 capsule by mouth every 6 (six) hours as needed. For pain   Yes Historical Provider, MD  pantoprazole (PROTONIX) 40 MG tablet Take 40 mg by mouth every evening.  Yes Historical Provider, MD  warfarin (COUMADIN) 5 MG tablet Take 2.5-5 mg by mouth as directed. *Take one-half tablet (2.5mg ) by mouth on Mondays and Fridays. Take one tablet (5mg ) on all other days   Yes Historical Provider, MD   Physical Exam: Filed Vitals:   12/28/11 1536 12/28/11 1600 12/28/11 1700 12/28/11 1730  BP: 106/81 101/56 88/34 101/54  Pulse: 159 133 63 67  Temp:      TempSrc:      Resp: 20 16 14 24   Height:      Weight:      SpO2: 92% 93% 95% 94%     General:  Pleasant overweight 61 year old Caucasian woman  sitting up in bed, in no acute distress.  Eyes: Pupils are equal, round, and reactive to light. Extraocular movements are intact. Conjunctivae are clear. Sclerae are white.  ENT: Oropharynx reveals mildly dry mucous membranes. No posterior exudates or erythema.  Neck: Supple, no adenopathy, no thyromegaly, no JVD.  Cardiovascular: S1, S2, with a soft systolic murmur.  Respiratory: Rare/occasional wheeze, breath sounds audible down to the bases. Breathing is nonlabored.  Abdomen: Obese, positive bowel sounds, soft, nontender, nondistended.  Skin: Venous stasis changes noted on the left lower leg. Bilateral varicosities noted around the ankles. Trace of pedal edema on the left leg, none on the right.  Musculoskeletal: No acute hot red joints. Arthritic changes noted in her left greater than right ankles and her knees.  Psychiatric: Alert and oriented x3. Pleasant affect. Speech is clear.  Neurologic: Cranial nerves II through XII are intact. Strength is 5 over 5 throughout. Sensation is intact.  Labs on Admission:  Basic Metabolic Panel:  Lab 12/28/11 1610  NA 140  K 4.7  CL 102  CO2 30  GLUCOSE 126*  BUN 10  CREATININE 1.08  CALCIUM 9.8  MG --  PHOS --   Liver Function Tests: No results found for this basename: AST:5,ALT:5,ALKPHOS:5,BILITOT:5,PROT:5,ALBUMIN:5 in the last 168 hours No results found for this basename: LIPASE:5,AMYLASE:5 in the last 168 hours No results found for this basename: AMMONIA:5 in the last 168 hours CBC:  Lab 12/28/11 1433  WBC 7.5  NEUTROABS 5.3  HGB 14.4  HCT 44.7  MCV 95.9  PLT 202   Cardiac Enzymes:  Lab 12/28/11 1433  CKTOTAL --  CKMB --  CKMBINDEX --  TROPONINI <0.30    BNP (last 3 results) No results found for this basename: PROBNP:3 in the last 8760 hours CBG: No results found for this basename: GLUCAP:5 in the last 168 hours  Radiological Exams on Admission: Dg Chest Portable 1 View  12/28/2011  *RADIOLOGY REPORT*   Clinical Data: Tachycardia  PORTABLE CHEST - 1 VIEW  Comparison: None.  Findings: The heart and pulmonary vascularity are within normal limits.  The lungs are free of acute infiltrate or sizable effusion.  No acute bony abnormality is seen.  IMPRESSION: No acute abnormality is noted.   Original Report Authenticated By: Phillips Odor, M.D.      Assessment/Plan Principal Problem:  *Atrial fibrillation with RVR Active Problems:  Chest pain, mid sternal  History of pulmonary embolism  Tobacco abuse  Chronic anxiety  GERD (gastroesophageal reflux disease)  Warfarin anticoagulation   65. 61 year old woman with a history significant for recurrent VTE, on chronic Coumadin therapy, chronic anxiety, and tobacco abuse, who presents with new onset atrial fibrillation with rapid ventricular response. She is already anticoagulated on Coumadin for chronic treatment of DVT/PE. Her rate is controlled on the Cardizem drip  that has been started. Her rhythm has converted to normal sinus rhythm. She is hemodynamically stable.     Plan: 1. Continue Cardizem drip in the step down unit. Will convert to oral dosing in the morning if she remains in sinus rhythm and if her heart rate is well controlled. 2. Continue warfarin therapy. 3. Tobacco cessation counseling. The patient was strongly advised to stop smoking. We'll place a nicotine patch. 4. We'll continue her chronic medications. 5. Will start gentle IV fluids. 6. For further evaluation, we will order a 2-D echocardiogram, cardiac enzymes, TSH, free T4, magnesium level, and phosphorus level. We'll check a followup EKG in the morning. 7. We'll consult cardiology in the morning.   Code Status: Full code Family Communication: Discussed with husband and daughter Disposition Plan: Discharge to home when clinically improved.  Time spent: One hour.  Elisha Cooksey [facility]   If 7PM-7AM, please contact night-coverage [email or web address] Password  [facility] 12/28/2011, 6:07 PM

## 2011-12-28 NOTE — ED Notes (Signed)
Cardizem drip lowered to 5 mg/hr per Dr Sherrie Mustache verbal order due to BP. Dr Sherrie Mustache at bedside for patient evaluation.

## 2011-12-28 NOTE — ED Provider Notes (Signed)
History     CSN: [id number]  Arrival date & time 12/28/11  1416   First MD Initiated Contact with Patient 12/28/11 1430      Chief Complaint  Patient presents with  . Chest Pain    HPI Pt was seen at 1435.  Per pt, c/o sudden onset and persistence of constant "dull" mid-sternal chest pain that began PTA.  Was associated with N/V.  Upon EMS arrival to scene, monitor with afib with RVR, rate 180's.  EMS gave IV adenosine without change, followed by IV cardizem with improvement of HR to 130-140's.  Pt states her chest discomfort and nausea are starting to improve.  Denies palpitations, no SOB/cough, no back pain, no abd pain.      Past Medical History  Diagnosis Date  . DVT (deep venous thrombosis)   . GERD (gastroesophageal reflux disease)   . Arthritis   . Lower extremity venous stasis     LLE, chronic  . Pulmonary embolism   . Spinal stenosis   . Chronic back pain     Past Surgical History  Procedure Date  . Ankle surgery     x2  . Cholecystectomy   . Carpal tunnel release   . Ovarian cyst removal   . Rotator cuff repair   . Tubal ligation   . Appendectomy     History  Substance Use Topics  . Smoking status: Current Everyday Smoker -- 1.0 packs/day    Types: Cigarettes  . Smokeless tobacco: Not on file  . Alcohol Use: No    Review of Systems ROS: Statement: All systems negative except as marked or noted in the HPI; Constitutional: Negative for fever and chills. ; ; Eyes: Negative for eye pain, redness and discharge. ; ; ENMT: Negative for ear pain, hoarseness, nasal congestion, sinus pressure and sore throat. ; ; Cardiovascular: +CP.  Negative for palpitations, diaphoresis, dyspnea and peripheral edema. ; ; Respiratory: Negative for cough, wheezing and stridor. ; ; Gastrointestinal: +N/V. Negative for diarrhea, abdominal pain, blood in stool, hematemesis, jaundice and rectal bleeding. . ; ; Genitourinary: Negative for dysuria, flank pain and hematuria. ; ;  Musculoskeletal: Negative for back pain and neck pain. Negative for swelling and trauma.; ; Skin: Negative for pruritus, rash, abrasions, blisters, bruising and skin lesion.; ; Neuro: Negative for headache, lightheadedness and neck stiffness. Negative for weakness, altered level of consciousness , altered mental status, extremity weakness, paresthesias, involuntary movement, seizure and syncope.       Allergies  Aspirin  Home Medications   Current Outpatient Rx  Name Route Sig Dispense Refill  . BUPROPION HCL ER (SR) 150 MG PO TB12 Oral Take 150 mg by mouth 2 (two) times daily.    Marland Kitchen HYDROCODONE-ACETAMINOPHEN 5-500 MG PO CAPS Oral Take 1 capsule by mouth every 6 (six) hours as needed. For pain    . PANTOPRAZOLE SODIUM 40 MG PO TBEC Oral Take 40 mg by mouth every evening.    . WARFARIN SODIUM 5 MG PO TABS Oral Take 2.5-5 mg by mouth as directed. *Take one-half tablet (2.5mg ) by mouth on Mondays and Fridays. Take one tablet (5mg ) on all other days      BP 126/69  Pulse 133  Temp 99.4 F (37.4 C) (Oral)  Resp 17  Ht 5\' 7"  (1.702 m)  Wt 254 lb (115.214 kg)  BMI 39.78 kg/m2  SpO2 95%  Physical Exam 1440: Physical examination:  Nursing notes reviewed; Vital signs and O2 SAT reviewed;  Constitutional: Well developed,  Well nourished, Well hydrated, In no acute distress; Head:  Normocephalic, atraumatic; Eyes: EOMI, PERRL, No scleral icterus; ENMT: Mouth and pharynx normal, Mucous membranes moist; Neck: Supple, Full range of motion, No lymphadenopathy; Cardiovascular: Tachycardia, irregular irregular rate and rhythm, No gallop; Respiratory: Breath sounds clear & equal bilaterally, No wheezes.  Speaking full sentences with ease, Normal respiratory effort/excursion; Chest: Nontender, Movement normal; Abdomen: Soft, Nontender, Nondistended, Normal bowel sounds;; Extremities: Pulses normal, No tenderness, +chronic LLE edema per hx.; Neuro: AA&Ox3, Major CN grossly intact.  Speech clear. No gross  focal motor or sensory deficits in extremities.; Skin: Color normal, Warm, Dry.    ED Course  Procedures   MDM  MDM Reviewed: nursing note, vitals and previous chart Interpretation: ECG, labs and x-ray Total time providing critical care: 30-74 minutes. This excludes time spent performing separately reportable procedures and services. Consults: admitting MD   CRITICAL CARE Performed by: Laray Anger Total critical care time: 35 Critical care time was exclusive of separately billable procedures and treating other patients. Critical care was necessary to treat or prevent imminent or life-threatening deterioration. Critical care was time spent personally by me on the following activities: development of treatment plan with patient and/or surrogate as well as nursing, discussions with consultants, evaluation of patient's response to treatment, examination of patient, obtaining history from patient or surrogate, ordering and performing treatments and interventions, ordering and review of laboratory studies, ordering and review of radiographic studies, pulse oximetry and re-evaluation of patient's condition.     Date: 12/28/2011  1st EKG on arrival  Rate: 140  Rhythm: atrial fibrillation  QRS Axis: normal  Intervals: normal  ST/T Wave abnormalities: normal  Conduction Disutrbances:none  Narrative Interpretation:   Old EKG Reviewed: none available.   Date: 12/28/2011  2nd EKG after IV Cardizem bolus and gtt  Rate: 66  Rhythm: normal sinus rhythm  QRS Axis: normal  Intervals: normal  ST/T Wave abnormalities: normal  Conduction Disutrbances:none  Narrative Interpretation:   Old EKG Reviewed: changes noted, now NSR compared to previous EKG today.  Results for orders placed during the hospital encounter of 12/28/11  CBC WITH DIFFERENTIAL      Component Value Range   WBC 7.5  4.0 - 10.5 K/uL   RBC 4.66  3.87 - 5.11 MIL/uL   Hemoglobin 14.4  12.0 - 15.0 g/dL   HCT 96.0  45.4 -  09.8 %   MCV 95.9  78.0 - 100.0 fL   MCH 30.9  26.0 - 34.0 pg   MCHC 32.2  30.0 - 36.0 g/dL   RDW 11.9  14.7 - 82.9 %   Platelets 202  150 - 400 K/uL   Neutrophils Relative 71  43 - 77 %   Neutro Abs 5.3  1.7 - 7.7 K/uL   Lymphocytes Relative 21  12 - 46 %   Lymphs Abs 1.6  0.7 - 4.0 K/uL   Monocytes Relative 6  3 - 12 %   Monocytes Absolute 0.5  0.1 - 1.0 K/uL   Eosinophils Relative 1  0 - 5 %   Eosinophils Absolute 0.1  0.0 - 0.7 K/uL   Basophils Relative 0  0 - 1 %   Basophils Absolute 0.0  0.0 - 0.1 K/uL  BASIC METABOLIC PANEL      Component Value Range   Sodium 140  135 - 145 mEq/L   Potassium 4.7  3.5 - 5.1 mEq/L   Chloride 102  96 - 112 mEq/L   CO2 30  19 - 32 mEq/L   Glucose, Bld 126 (*) 70 - 99 mg/dL   BUN 10  6 - 23 mg/dL   Creatinine, Ser 4.54  0.50 - 1.10 mg/dL   Calcium 9.8  8.4 - 09.8 mg/dL   GFR calc non Af Amer 55 (*) >90 mL/min   GFR calc Af Amer 63 (*) >90 mL/min  TROPONIN I      Component Value Range   Troponin I <0.30  <0.30 ng/mL  PROTIME-INR      Component Value Range   Prothrombin Time 24.4 (*) 11.6 - 15.2 seconds   INR 2.15 (*) 0.00 - 1.49    Dg Chest Portable 1 View 12/28/2011  *RADIOLOGY REPORT*  Clinical Data: Tachycardia  PORTABLE CHEST - 1 VIEW  Comparison: None.  Findings: The heart and pulmonary vascularity are within normal limits.  The lungs are free of acute infiltrate or sizable effusion.  No acute bony abnormality is seen.  IMPRESSION: No acute abnormality is noted.   Original Report Authenticated By: Phillips Odor, M.D.       1645:  Pt feels her symptoms have improved after IV cardizem bolus and gtt.  HR now 60's, monitor NSR.  Sitting up, eating ice, talking with family, resps easy, NAD.  No N/V while in the ED.  Dx testing d/w pt and family.  Questions answered.  Verb understanding, agreeable to admit.  T/C to [facility]. Sherrie Mustache, case discussed, including:  HPI, pertinent PM/SHx, VS/PE, dx testing, ED course and treatment:  Agreeable to  admit, requests to obtain stepdown bed to team 1.             Laray Anger, DO 12/29/11 [phone]

## 2011-12-28 NOTE — ED Notes (Signed)
Patient arrives via EMS with c/o substernal chest pain that started today. Patient noted to be in Afib per EMS. No prior history of Afib.  Adenosine x 3 given, 20 mg cardizem given in route to ED. HR 180s upon EMS arrival prior to medications. HR 140s after medications given.  Family history of a fib and SVT.

## 2011-12-29 ENCOUNTER — Other Ambulatory Visit: Payer: Self-pay | Admitting: *Deleted

## 2011-12-29 ENCOUNTER — Encounter (HOSPITAL_COMMUNITY): Payer: Self-pay | Admitting: Adult Health

## 2011-12-29 DIAGNOSIS — R079 Chest pain, unspecified: Secondary | ICD-10-CM

## 2011-12-29 DIAGNOSIS — R7989 Other specified abnormal findings of blood chemistry: Secondary | ICD-10-CM

## 2011-12-29 DIAGNOSIS — Z7901 Long term (current) use of anticoagulants: Secondary | ICD-10-CM

## 2011-12-29 DIAGNOSIS — I517 Cardiomegaly: Secondary | ICD-10-CM

## 2011-12-29 LAB — BASIC METABOLIC PANEL
GFR calc Af Amer: 85 mL/min — ABNORMAL LOW (ref >90)
GFR calc non Af Amer: 73 mL/min — ABNORMAL LOW (ref >90)
Glucose, Bld: 99 mg/dL (ref 70–99)
Potassium: 3.9 mEq/L (ref 3.5–5.1)
Sodium: 138 mEq/L (ref 135–145)

## 2011-12-29 LAB — CBC
Hemoglobin: 14.1 g/dL (ref 12.0–15.0)
RBC: 4.46 MIL/uL (ref 3.87–5.11)

## 2011-12-29 LAB — TROPONIN I
Troponin I: 0.32 ng/mL (ref <0.30)
Troponin I: <0.3 ng/mL (ref <0.30)

## 2011-12-29 MED ORDER — DILTIAZEM HCL ER COATED BEADS 120 MG PO CP24
120.0000 mg | ORAL_CAPSULE | Freq: Every day | ORAL | Status: DC
  Administered 2011-12-29: 120 mg via ORAL
  Filled 2011-12-29: qty 1

## 2011-12-29 MED ORDER — WARFARIN SODIUM 2.5 MG PO TABS: 2.5000 mg | ORAL_TABLET | Freq: Once | ORAL | Status: DC

## 2011-12-29 MED ORDER — SODIUM CHLORIDE 0.9 % IJ SOLN
INTRAMUSCULAR | Status: AC
  Filled 2011-12-29: qty 3

## 2011-12-29 MED ORDER — DILTIAZEM HCL ER COATED BEADS 120 MG PO CP24: 120.0000 mg | ORAL_CAPSULE | Freq: Every day | ORAL | Status: DC

## 2011-12-29 NOTE — Progress Notes (Signed)
*  PRELIMINARY RESULTS* Echocardiogram 2D Echocardiogram has been performed.  Sarah [name] 12/29/2011, 10:07 AM

## 2011-12-29 NOTE — Clinical Documentation Improvement (Signed)
BMI DOCUMENTATION CLARIFICATION QUERY  THIS DOCUMENT IS NOT A PERMANENT PART OF THE MEDICAL RECORD         12/29/11  Dear Dr. Lendell Caprice,  In an effort to better capture your patient's severity of illness, reflect appropriate length of stay and utilization of resources, a review of the patient medical record has revealed the following indicators.   Based on your clinical judgment, please clarify and document in a progress note and/or discharge summary the clinical condition associated with the following supporting information: In responding to this query please exercise your independent judgment.  The fact that a query is asked, does not imply that any particular answer is desired or expected.   According to the documented Height and Weight in CHL/EPIC, the patients BMI is greater than 40. If your clinical findings/judgment agrees with this,if possible could you please help clarify the suspected diagnosis in the progress note and discharge summary. THANK YOU!    [facility]ACT[facility]: A diagnosis of UNDERWEIGHT or MORBID OBESITY should have the BMI documented along with it.   Possible Clinical Conditions?  - Morbid Obesity  - Other condition (please document in the progress notes and/or discharge summary)  - Cannot Clinically determine at this time    Supporting Information:  Body mass index is 40.43 kg/(m^2). Filed Weights   12/28/11 1424 12/28/11 1730 12/29/11 0530  Weight: 254 lb (115.214 kg) 256 lb (116.121 kg) 258 lb 2.5 oz (117.1 kg)  Height: 5'7" 12/28/11     No additional documentation in chart upon review. SM    Thank You,  Saul Fordyce  Clinical Documentation Specialist: [phone] Pager  Health Information Management [place]

## 2011-12-29 NOTE — Progress Notes (Addendum)
Sarah [name]  Chart reviewed.  Subjective:  No chest pains, palpitations, dyspnea.  Objective: Filed Vitals:   12/29/11 0615 12/29/11 0630 12/29/11 0645 12/29/11 0722  BP: 109/54 116/53 106/55   Pulse: 63 62 63   Temp:    98 F (36.7 C)  TempSrc:    Oral  Resp: 22 22 26    Height:      Weight:      SpO2: 96% 95% 95%    Telemetry: Normal sinus rhythm.  Lungs: Clear to auscultation bilaterally without wheeze rhonchi or rales Cardiovascular regular rate rhythm without murmurs gallops rubs Abdomen obese soft nontender Extremities right leg without edema.  Left leg with nonpitting edema and chronic skin changes.  Troponin 0.34 Thyroid function test pending.  Assessment and plan Principal Problem:  *Atrial fibrillation with RVR Active Problems:  Elevated troponin  Chest pain, mid sternal  History of pulmonary embolism  Tobacco abuse  Chronic anxiety  GERD (gastroesophageal reflux disease)  Warfarin anticoagulation  Will change to oral Cardizem. Discontinue IV fluids. Await echocardiogram. Hopefully discharge later today. Troponin elevation likely related to the tachycardia/atrial fibrillation which has now resolved.  Crista Curb, M.D. [facility]

## 2011-12-29 NOTE — Care Management Note (Signed)
    Page 1 of 1   12/29/2011     1:06:58 PM   CARE MANAGEMENT NOTE 12/29/2011  Patient:  Sarah [name], Sarah [name]   Account Number:  [id number]  Date Initiated:  12/29/2011  Documentation initiated by:  Rosemary Holms  Subjective/Objective Assessment:   Pt admitted from home with A. Fib with RVR. lives w/ spouse.     Action/Plan:   DC home with medically stable   Anticipated DC Date:  12/29/2011   Anticipated DC Plan:  HOME/SELF CARE      DC Planning Services  CM consult      Choice offered to / List presented to:             Status of service:  Completed, signed off Medicare Important Message given?   (If response is "NO", the following Medicare IM given date fields will be blank) Date Medicare IM given:   Date Additional Medicare IM given:    Discharge Disposition:  HOME/SELF CARE  Per UR Regulation:    If discussed at Long Length of Stay Meetings, dates discussed:    Comments:  12/29/11 1200 Stepahnie Campo Leanord Hawking RN BSN CM

## 2011-12-29 NOTE — Consult Note (Signed)
ANTICOAGULATION CONSULT NOTE - Initial Consult  Pharmacy Consult for Warfarin Indication: atrial fibrillation  Allergies  Allergen Reactions  . Aspirin Nausea And Vomiting   Patient Measurements: Height: 5\' 7"  (170.2 cm) Weight: 258 lb 2.5 oz (117.1 kg) IBW/kg (Calculated) : 61.6   Vital Signs: Temp: 98 F (36.7 C) (08/30 0722) Temp src: Oral (08/30 0722) BP: 106/55 mmHg (08/30 0645) Pulse Rate: 63  (08/30 0645)  Labs:  Basename 12/29/11 0826 12/29/11 0221 12/28/11 2001 12/28/11 1433  HGB -- 14.1 -- 14.4  HCT -- 42.3 -- 44.7  PLT -- 200 -- 202  APTT -- -- -- --  LABPROT 25.0* -- -- 24.4*  INR 2.22* -- -- 2.15*  HEPARINUNFRC -- -- -- --  CREATININE -- 0.85 -- 1.08  CKTOTAL -- -- 56 --  CKMB -- -- 2.3 --  TROPONINI <0.30 0.32* 0.34* --   Estimated Creatinine Clearance: 93.1 ml/min (by C-G formula based on Cr of 0.85).  Medical History: Past Medical History  Diagnosis Date  . DVT (deep venous thrombosis)   . GERD (gastroesophageal reflux disease)   . Arthritis   . Lower extremity venous stasis     LLE, chronic  . Pulmonary embolism   . Spinal stenosis   . Chronic back pain   . Warfarin anticoagulation   . Chronic anxiety     Patient describes as stress  . Tobacco abuse    Medications:  Prescriptions prior to admission  Medication Sig Dispense Refill  . buPROPion (WELLBUTRIN SR) 150 MG 12 hr tablet Take 150 mg by mouth 2 (two) times daily.      . hydrocodone-acetaminophen (LORCET-HD) 5-500 MG per capsule Take 1 capsule by mouth every 6 (six) hours as needed. For pain      . pantoprazole (PROTONIX) 40 MG tablet Take 40 mg by mouth every evening.      . warfarin (COUMADIN) 5 MG tablet Take 2.5-5 mg by mouth as directed. *Take one-half tablet (2.5mg ) by mouth on Mondays and Fridays. Take one tablet (5mg ) on all other days       Assessment: 60yo female on chronic anticoagulation for AFib.  INR therapeutic.  Home regimen noted as above.  Goal of Therapy:  INR  2-3 Monitor platelets by anticoagulation protocol: Yes   Plan: Coumadin 2.5mg  po today x 1 INR daily  Lillyn Wieczorek A 12/29/2011,9:12 AM

## 2011-12-29 NOTE — Discharge Summary (Signed)
Physician Discharge Summary  Patient ID: CARRINA SCHOENBERGER MRN: [id number] DOB/AGE: 61-Jun-1952 61 y.o.  Admit date: 12/28/2011 Discharge date: 12/29/2011  Discharge Diagnoses:  Principal Problem:  *Atrial fibrillation with RVR, new Active Problems:  Elevated troponin  Chest pain, mid sternal  History of pulmonary embolism  Tobacco abuse  Chronic anxiety  GERD (gastroesophageal reflux disease)  Warfarin anticoagulation  Tests pending at the time of discharge: TSH.  Medication List  As of 12/29/2011 12:56 PM   TAKE these medications         buPROPion 150 MG 12 hr tablet   Commonly known as: WELLBUTRIN SR   Take 150 mg by mouth 2 (two) times daily.      diltiazem 120 MG 24 hr capsule   Commonly known as: CARDIZEM CD   Take 1 capsule (120 mg total) by mouth daily.      hydrocodone-acetaminophen 5-500 MG per capsule   Commonly known as: LORCET-HD   Take 1 capsule by mouth every 6 (six) hours as needed. For pain      pantoprazole 40 MG tablet   Commonly known as: PROTONIX   Take 40 mg by mouth every evening.      warfarin 5 MG tablet   Commonly known as: COUMADIN   Take 2.5-5 mg by mouth as directed. *Take one-half tablet (2.5mg ) by mouth on Mondays and Fridays. Take one tablet (5mg ) on all other days            Discharge Orders    Future Appointments: Provider: Department: Dept Phone: Center:   01/04/2012 11:00 AM [facility] [phone] [id number]   01/12/2012 2:00 PM Jodelle Gross, NP [facility] [phone] [id number]     Future Orders Please Complete By Expires   Diet - low sodium heart healthy      Activity as tolerated - No restrictions         Follow-up Information    Follow up with Nona Dell, MD. (his office will call you with appointment time)    Contact information:   [address] [address] [phone]       Follow up with Harlow Asa, MD. (to arrange  sleep study)    Contact information:   [address] [place] [address] [phone]         Disposition:  home  Discharged Condition: Stable  Consults:  Nona Dell.  Labs:   Results for orders placed during the hospital encounter of 12/28/11 (from the past 48 hour(s))  CBC WITH DIFFERENTIAL     Status: Normal   Collection Time   12/28/11  2:33 PM      Component Value Range Comment   WBC 7.5  4.0 - 10.5 K/uL    RBC 4.66  3.87 - 5.11 MIL/uL    Hemoglobin 14.4  12.0 - 15.0 g/dL    HCT 35.5  73.2 - 20.2 %    MCV 95.9  78.0 - 100.0 fL    MCH 30.9  26.0 - 34.0 pg    MCHC 32.2  30.0 - 36.0 g/dL    RDW 54.2  70.6 - 23.7 %    Platelets 202  150 - 400 K/uL    Neutrophils Relative 71  43 - 77 %    Neutro Abs 5.3  1.7 - 7.7 K/uL    Lymphocytes Relative 21  12 - 46 %    Lymphs Abs 1.6  0.7 - 4.0 K/uL  Monocytes Relative 6  3 - 12 %    Monocytes Absolute 0.5  0.1 - 1.0 K/uL    Eosinophils Relative 1  0 - 5 %    Eosinophils Absolute 0.1  0.0 - 0.7 K/uL    Basophils Relative 0  0 - 1 %    Basophils Absolute 0.0  0.0 - 0.1 K/uL   BASIC METABOLIC PANEL     Status: Abnormal   Collection Time   12/28/11  2:33 PM      Component Value Range Comment   Sodium 140  135 - 145 mEq/L    Potassium 4.7  3.5 - 5.1 mEq/L    Chloride 102  96 - 112 mEq/L    CO2 30  19 - 32 mEq/L    Glucose, Bld 126 (*) 70 - 99 mg/dL    BUN 10  6 - 23 mg/dL    Creatinine, Ser 1.61  0.50 - 1.10 mg/dL    Calcium 9.8  8.4 - 09.6 mg/dL    GFR calc non Af Amer 55 (*) >90 mL/min    GFR calc Af Amer 63 (*) >90 mL/min   TROPONIN I     Status: Normal   Collection Time   12/28/11  2:33 PM      Component Value Range Comment   Troponin I <0.30  <0.30 ng/mL   PROTIME-INR     Status: Abnormal   Collection Time   12/28/11  2:33 PM      Component Value Range Comment   Prothrombin Time 24.4 (*) 11.6 - 15.2 seconds    INR 2.15 (*) 0.00 - 1.49   MRSA PCR SCREENING     Status: Normal    Collection Time   12/28/11  6:18 PM      Component Value Range Comment   MRSA by PCR NEGATIVE  NEGATIVE   MAGNESIUM     Status: Normal   Collection Time   12/28/11  8:01 PM      Component Value Range Comment   Magnesium 2.1  1.5 - 2.5 mg/dL   PHOSPHORUS     Status: Normal   Collection Time   12/28/11  8:01 PM      Component Value Range Comment   Phosphorus 2.8  2.3 - 4.6 mg/dL   TROPONIN I     Status: Abnormal   Collection Time   12/28/11  8:01 PM      Component Value Range Comment   Troponin I 0.34 (*) <0.30 ng/mL   CK TOTAL AND CKMB     Status: Normal   Collection Time   12/28/11  8:01 PM      Component Value Range Comment   Total CK 56  7 - 177 U/L    CK, MB 2.3  0.3 - 4.0 ng/mL    Relative Index RELATIVE INDEX IS INVALID  0.0 - 2.5   BASIC METABOLIC PANEL     Status: Abnormal   Collection Time   12/29/11  2:21 AM      Component Value Range Comment   Sodium 138  135 - 145 mEq/L    Potassium 3.9  3.5 - 5.1 mEq/L DELTA CHECK NOTED   Chloride 103  96 - 112 mEq/L    CO2 27  19 - 32 mEq/L    Glucose, Bld 99  70 - 99 mg/dL    BUN 9  6 - 23 mg/dL    Creatinine, Ser 0.45  0.50 - 1.10 mg/dL  Calcium 9.0  8.4 - 10.5 mg/dL    GFR calc non Af Amer 73 (*) >90 mL/min    GFR calc Af Amer 85 (*) >90 mL/min   CBC     Status: Normal   Collection Time   12/29/11  2:21 AM      Component Value Range Comment   WBC 7.7  4.0 - 10.5 K/uL    RBC 4.46  3.87 - 5.11 MIL/uL    Hemoglobin 14.1  12.0 - 15.0 g/dL    HCT 16.1  09.6 - 04.5 %    MCV 94.8  78.0 - 100.0 fL    MCH 31.6  26.0 - 34.0 pg    MCHC 33.3  30.0 - 36.0 g/dL    RDW 40.9  81.1 - 91.4 %    Platelets 200  150 - 400 K/uL   TROPONIN I     Status: Abnormal   Collection Time   12/29/11  2:21 AM      Component Value Range Comment   Troponin I 0.32 (*) <0.30 ng/mL   TROPONIN I     Status: Normal   Collection Time   12/29/11  8:26 AM      Component Value Range Comment   Troponin I <0.30  <0.30 ng/mL   PROTIME-INR     Status:  Abnormal   Collection Time   12/29/11  8:26 AM      Component Value Range Comment   Prothrombin Time 25.0 (*) 11.6 - 15.2 seconds    INR 2.22 (*) 0.00 - 1.49    Diagnostics:  Dg Chest Portable 1 View  12/28/2011  *RADIOLOGY REPORT*  Clinical Data: Tachycardia  PORTABLE CHEST - 1 VIEW  Comparison: None.  Findings: The heart and pulmonary vascularity are within normal limits.  The lungs are free of acute infiltrate or sizable effusion.  No acute bony abnormality is seen.  IMPRESSION: No acute abnormality is noted.   Original Report Authenticated By: Phillips Odor, M.D.    Echocardiogram  - Left ventricle: The cavity size was normal. Wall thickness was increased in a pattern of mild LVH. Systolic function was vigorous. The estimated ejection fraction was in the range of 75% to 80%. Wall motion was normal; there were no regional wall motion abnormalities. Doppler parameters are consistent with abnormal left ventricular relaxation (grade 1 diastolic dysfunction). - Mitral valve: Trivial regurgitation. - Left atrium: The atrium was mildly dilated. - Right ventricle: Hypertrophy was present. Systolic function was normal. - Tricuspid valve: Trivial regurgitation. - Pulmonary arteries: Systolic pressure could not be accurately estimated. - Pericardium, extracardiac: There was no pericardial effusion.  Hospital Course: See H&P for complete admission details. Ms. Emelda Fear is a pleasant 61 year old white female who presented with chest pain. She took her heart rate at home and it was fast. When EMS arrived, her heart rate was over 200. She received adenosine and subsequently IV Cardizem. She has no previous history of heart problems. In the emergency room, she was noted to have a heart rate in the 140s, atrial fibrillation.  She was submitted to the hospitalist service overnight. She converted to normal sinus rhythm. Cardizem drip was transitioned to oral Cardizem. TSH is pending. Echocardiogram  shows normal ejection fraction. Cardiology was consulted and will perform an outpatient stress test. Her troponins were slightly elevated but likely related to tachycardia, not acute coronary syndrome. Patient is on Coumadin for previous history of thromboembolic disease. She has had no recurrence of symptoms or dysrhythmia.  She's been cleared by cardiology for discharge. She snores, is obese and would benefit from an outpatient polysomnogram which will be deferred to her primary care physician. Total time on the day of discharge greater than 30 minutes.    Discharge Exam:  See progress note from today.  SignedChristiane Ha 12/29/2011, 12:56 PM

## 2011-12-29 NOTE — Progress Notes (Signed)
UR Chart Review Completed  

## 2011-12-29 NOTE — Consult Note (Signed)
CARDIOLOGY CONSULT NOTE  Patient ID: Sarah [name] MRN: [id number] DOB/AGE: Sep 21, 1950 60 y.o.  Admit date: 12/28/2011 Referring Physician: [facility] Primary PhysicianLUKING,W S, MD Consulting Cardiologist: Ellis Parents) Diona Browner Reason for Consultation: New onset PAF with RVR and positive CE.  Principal Problem:  *Atrial fibrillation with RVR Active Problems:  Chest pain, mid sternal  History of pulmonary embolism  Tobacco abuse  Chronic anxiety  GERD (gastroesophageal reflux disease)  Warfarin anticoagulation  Elevated troponin  HPI: Sarah [name] is a very friendly 61 year old female patient who presented to the emergency room with A. fib RVR. She has no prior history of cardiac disease or workup. She was in her usual state of health, running a lot of errands yesterday. When she returned home and unloaded groceries she began to feel very weak, nauseated, and having indigestion symptoms. She is a Immunologist, and a retired Insurance account manager. She checked her pulse and found it to be very rapid and irregular. She called her daughter who also is an EMT, who also brought her brother-in-law who is a paramedic, to the patient's home and called 911. On EMS arrival the patient was found to be in A. fib RVR and was given adenosine 6 mg x2 and then 12 mg. There was a transient return to normal sinus rhythm but quickly returned to A. fib with RVR. She was given IV Cardizem bolus and brought to ER. The patient's blood pressure on arrival was 126/69 with a heart rate 133 beats per minute. Initial troponin 0.34. She was again given a 10 mg bolus of diltiazem and started on a drip. She did convert to normal sinus rhythm within 2 hours of infusion.    The patient has a history of tobacco abuse, DVT x2 with PE currently on Coumadin, GERD, and chronic anxiety. She is currently feeling well without recurrence of indigestion like symptoms. Heart rate has remained in normal sinus rhythm. Followup cardiac enzymes reveal  a troponin 0.34 and 0.32 respectively. Potassium currently is 3.9. She is not anemic. Her husband does state that she snores heavily at night but cannot confirm that she stops breathing. She has not been worked up for sleep apnea. TSH is pending.  Review of systems complete and found to be negative unless listed above   Past Medical History  Diagnosis Date  . DVT (deep venous thrombosis)   . GERD (gastroesophageal reflux disease)   . Arthritis   . Lower extremity venous stasis     LLE, chronic  . Pulmonary embolism   . Spinal stenosis   . Chronic back pain   . Warfarin anticoagulation   . Chronic anxiety     Patient describes as stress  . Tobacco abuse     Family History  Problem Relation Age of Onset  . Arrhythmia Mother   . Stroke Father     Deceased    History   Social History  . Marital Status: Married    Spouse Name: N/A    Number of Children: N/A  . Years of Education: N/A   Occupational History  . EMT     Volunteer  . Dialysis Tech     Retired   Social History Main Topics  . Smoking status: Current Everyday Smoker -- 1.0 packs/day for 40 years    Types: Cigarettes  . Smokeless tobacco: Not on file  . Alcohol Use: No  . Drug Use: Not on file  . Sexually Active: Not on file   Other Topics Concern  . Not  on file   Social History Narrative  . No narrative on file    Past Surgical History  Procedure Date  . Ankle surgery     x2  . Cholecystectomy   . Carpal tunnel release   . Ovarian cyst removal   . Rotator cuff repair   . Tubal ligation   . Appendectomy      Prescriptions prior to admission  Medication Sig Dispense Refill  . buPROPion (WELLBUTRIN SR) 150 MG 12 hr tablet Take 150 mg by mouth 2 (two) times daily.      . hydrocodone-acetaminophen (LORCET-HD) 5-500 MG per capsule Take 1 capsule by mouth every 6 (six) hours as needed. For pain      . pantoprazole (PROTONIX) 40 MG tablet Take 40 mg by mouth every evening.      . warfarin (COUMADIN)  5 MG tablet Take 2.5-5 mg by mouth as directed. *Take one-half tablet (2.5mg ) by mouth on Mondays and Fridays. Take one tablet (5mg ) on all other days        Physical Exam: Blood pressure 106/55, pulse 63, temperature 98 F (36.7 C), temperature source Oral, resp. rate 26, height 5\' 7"  (1.702 m), weight 258 lb 2.5 oz (117.1 kg), SpO2 95.00%.   General: Well developed, well nourished, in no acute distress, obese Head: Eyes PERRLA, No xanthomas.   Normal cephalic and atramatic  Lungs: Clear bilaterally to auscultation and percussion. Heart: HRRR S1 S2, without MRG.  Pulses are 2+ & equal.            No carotid bruit. No JVD.  No abdominal bruits. No femoral bruits. Abdomen: Bowel sounds are positive, abdomen soft and non-tender without masses or                  Hernia's noted. Msk:  Back normal, normal gait. Normal strength and tone for age. Extremities: No clubbing, cyanosis, left leg with mild chronic edema, warm to touch. .  DP +1 Neuro: Alert and oriented X 3. Psych:  Good affect, responds appropriately    Lab Results  Component Value Date   WBC 7.7 12/29/2011   HGB 14.1 12/29/2011   HCT 42.3 12/29/2011   MCV 94.8 12/29/2011   PLT 200 12/29/2011     Lab 12/29/11 0221  NA 138  K 3.9  CL 103  CO2 27  BUN 9  CREATININE 0.85  CALCIUM 9.0  PROT --  BILITOT --  ALKPHOS --  ALT --  AST --  GLUCOSE 99   Lab Results  Component Value Date   CKTOTAL 56 12/28/2011   CKMB 2.3 12/28/2011   TROPONINI 0.32* 12/29/2011        Radiology: Dg Chest Portable 1 View  12/28/2011  *RADIOLOGY REPORT*  Clinical Data: Tachycardia  PORTABLE CHEST - 1 VIEW  Comparison: None.  Findings: The heart and pulmonary vascularity are within normal limits.  The lungs are free of acute infiltrate or sizable effusion.  No acute bony abnormality is seen.  IMPRESSION: No acute abnormality is noted.   Original Report Authenticated By: Phillips Odor, M.D.    EKG:(Admission AFib with RVR rate of 140bpm)    Current : NSR rate of 60 bpm  ASSESSMENT AND PLAN:   1. Newly diagnosed atrial fibrillation: The patient was admitted with A. fib RVR and converted to normal sinus rhythm with rate control on Cardizem drip. Has been transitioned to by mouth Cardizem 120 mg po.  Blood pressure is low normal, will need  to be watched carefully. She admits to drinking a good bit of caffeine the day of admission, which she states she normally does not do, drinking decaffeinated coffee and tea normally. She states that her mother had arrhythmias which were often triggered by caffeine as well.      Borderline positive cardiac enzymes in flat pattern are probably related to tachycardia with mild demand ischemia. Possibility of followup stress testing is to be considered. She is already on chronic anticoagulation,  secondary to history of DVT with PE and will remain on Coumadin. There is no evidence of anemia. Her PT is 24.4 with an INR of 2.15. Echocardiogram has been ordered with more recommendations after results are obtained.    Incidentally, it is recommended that the patient have a sleep study with history of snoring, and questionable apnea during sleep hours. She has not had a sleep study in the past. With large body habitus and recent atrial fibrillation, this can be considered to avoid reoccurrence of atrial fibrillation as sleep apnea does contribute to this. This can be pursued as an outpatient at the discretion of her primary care physician.  2. Tobacco abuse: She is advised on smoking cessation, to include cardiovascular risk factors. Present at bedside states that he wants her to quit. She is seriously considering doing so. I have encouraged her in this endeavor.  Signed: Bettey Mare. Lyman Bishop NP [facility] 12/29/2011, 8:53 AM Co-Sign MD   Attending note:  Patient seen and examined. Reviewed records and database as recorded above. Presentation with newly diagnosed atrial fibrillation with rapid  ventricular response, symptomatic as noted. Rate was controlled with Cardizem with patient spontaneously converting to sinus rhythm where she has remained. She is already on Coumadin with history of DVT and pulmonary embolus. Minimal elevation in troponin, noted in flat pattern, is most likely associated with rapid heart rate and increased myocardial demand rather than true ACS. ECG reviewed with no acute ST segment changes in setting of atrial fibrillation or subsequent sinus rhythm.  She is comfortable on examination, afebrile with heart rate in the 60s, blood pressure 106/55. Weight approximately 250 pounds. Lungs are clear and nonlabored, cardiac exam with regular rate and rhythm, no gallop or rub. Additional lab work reviewed with normal potassium and renal function, normal hemoglobin and platelet count, no TSH obtained. Chest x-ray without acute abnormality.  We plan to followup on the echocardiogram. If LV function is normal and there are no other major valvular abnormalities, would anticipate continuing current course including Coumadin and oral Cardizem, with plan for a followup 2-day Lexiscan Myoview on medical therapy to exclude any significant degree of underlying ischemic heart disease. She can then followup with [place] in the office over the next few weeks. In addition, I would agree that a sleep study is indicated with recent diagnosis of atrial fibrillation and reported snoring. This can be arranged by her primary care physician.  Jonelle Sidle, M.D., F.A.C.C.

## 2012-01-02 DIAGNOSIS — R5381 Other malaise: Secondary | ICD-10-CM | POA: Diagnosis not present

## 2012-01-02 DIAGNOSIS — R5383 Other fatigue: Secondary | ICD-10-CM | POA: Diagnosis not present

## 2012-01-02 DIAGNOSIS — G4730 Sleep apnea, unspecified: Secondary | ICD-10-CM | POA: Diagnosis not present

## 2012-01-04 ENCOUNTER — Encounter (HOSPITAL_COMMUNITY): Payer: Self-pay | Admitting: Cardiology

## 2012-01-04 ENCOUNTER — Encounter (HOSPITAL_COMMUNITY)
Admission: RE | Admit: 2012-01-04 | Discharge: 2012-01-04 | Disposition: A | Discharge status: Home / Self Care | Payer: Medicare Other | Source: Ambulatory Visit | Attending: Adult Health | Admitting: Adult Health

## 2012-01-04 ENCOUNTER — Ambulatory Visit (INDEPENDENT_AMBULATORY_CARE_PROVIDER_SITE_OTHER): Payer: Medicare Other

## 2012-01-04 DIAGNOSIS — R079 Chest pain, unspecified: Secondary | ICD-10-CM

## 2012-01-04 DIAGNOSIS — I4891 Unspecified atrial fibrillation: Secondary | ICD-10-CM | POA: Diagnosis not present

## 2012-01-04 DIAGNOSIS — R0789 Other chest pain: Secondary | ICD-10-CM

## 2012-01-04 DIAGNOSIS — R072 Precordial pain: Secondary | ICD-10-CM | POA: Diagnosis not present

## 2012-01-04 MED ORDER — TECHNETIUM TC 99M TETROFOSMIN IV KIT
30.0000 | PACK | Freq: Once | INTRAVENOUS | Status: AC | PRN
  Administered 2012-01-04: 30 via INTRAVENOUS

## 2012-01-04 NOTE — Progress Notes (Signed)
Stress Lab Nurses Notes - Sarah [name] 01/04/2012 Reason for doing test: Arrhythmia/Palpitations and Chest Pain Type of test: [name] Nurse performing test: Parke Poisson, RN Nuclear Medicine Tech: Lyndel Pleasure Echo Tech: Not Applicable MD performing test: R. Dietrich Pates Family MD: [name] Test explained and consent signed: yes IV started: 22g jelco, Saline lock flushed and No redness or edema Symptoms: lightheaded & brief SOB Treatment/Intervention: None Reason test stopped: protocol completed After recovery IV was: Discontinued via X-ray tech and No redness or edema Patient to return to Nuc. Med at :11:45 Patient discharged: Home Patient's Condition upon discharge was: stable Comments: During test BP 128/60 & HR 90.  Recovery BP 124/60 & HR 78.  Symptoms resolved in recovery. [name]

## 2012-01-05 ENCOUNTER — Encounter (HOSPITAL_COMMUNITY)
Admission: RE | Admit: 2012-01-05 | Discharge: 2012-01-05 | Disposition: A | Discharge status: Home / Self Care | Payer: Medicare Other | Source: Ambulatory Visit | Attending: Adult Health | Admitting: Adult Health

## 2012-01-05 ENCOUNTER — Encounter (HOSPITAL_COMMUNITY): Payer: Self-pay

## 2012-01-05 DIAGNOSIS — I4891 Unspecified atrial fibrillation: Secondary | ICD-10-CM | POA: Insufficient documentation

## 2012-01-05 DIAGNOSIS — R079 Chest pain, unspecified: Secondary | ICD-10-CM | POA: Insufficient documentation

## 2012-01-05 MED ORDER — TECHNETIUM TC 99M TETROFOSMIN IV KIT
25.0000 | PACK | Freq: Once | INTRAVENOUS | Status: AC | PRN
  Administered 2012-01-05: 25 via INTRAVENOUS

## 2012-01-11 ENCOUNTER — Encounter: Payer: Self-pay | Admitting: Adult Health

## 2012-01-12 ENCOUNTER — Ambulatory Visit (INDEPENDENT_AMBULATORY_CARE_PROVIDER_SITE_OTHER): Payer: Medicare Other | Admitting: Adult Health

## 2012-01-12 ENCOUNTER — Encounter: Payer: Self-pay | Admitting: Adult Health

## 2012-01-12 VITALS — BP 128/72 | HR 86 | Ht 67.0 in | Wt 259.8 lb

## 2012-01-12 DIAGNOSIS — Z7901 Long term (current) use of anticoagulants: Secondary | ICD-10-CM | POA: Diagnosis not present

## 2012-01-12 DIAGNOSIS — I4891 Unspecified atrial fibrillation: Secondary | ICD-10-CM

## 2012-01-12 NOTE — Assessment & Plan Note (Signed)
Do to thromboembolic disease. She is followed by her primary care physician, Dr. Gerda Diss for dosage adjustments based upon PT/INR. Recommend Hemoccult stools periodically.

## 2012-01-12 NOTE — Progress Notes (Signed)
HPI: Sarah [name], is a 61 year old morbidly obese female patient of Dr. Dietrich Pates we are seeing on hospital followup after consultation for atrial fibrillation with RVR. The patient was given gentamicin and IV Cardizem and returned to normal sinus rhythm. She remains on Coumadin with a history of thromboembolic disease. She is followed by Dr. Gerda Diss for this. Post discharge the patient had a echocardiogram and a nuclear medicine stress Myoview. She is here to discuss results. She is without complaint of chest pain rapid heart rhythm dizziness or bleeding issues.  Allergies  Allergen Reactions  . Aspirin Nausea And Vomiting    Current Outpatient Prescriptions  Medication Sig Dispense Refill  . buPROPion (WELLBUTRIN SR) 150 MG 12 hr tablet Take 150 mg by mouth 2 (two) times daily.      Marland Kitchen diltiazem (CARDIZEM CD) 120 MG 24 hr capsule Take 1 capsule (120 mg total) by mouth daily.  30 capsule  0  . hydrocodone-acetaminophen (LORCET-HD) 5-500 MG per capsule Take 1 capsule by mouth every 6 (six) hours as needed. For pain      . pantoprazole (PROTONIX) 40 MG tablet Take 40 mg by mouth every evening.      . warfarin (COUMADIN) 5 MG tablet Take 2.5-5 mg by mouth as directed. *Take one-half tablet (2.5mg ) by mouth on Mondays and Fridays. Take one tablet (5mg ) on all other days        Past Medical History  Diagnosis Date  . DVT (deep venous thrombosis)   . GERD (gastroesophageal reflux disease)   . Arthritis   . Lower extremity venous stasis     LLE, chronic  . Pulmonary embolism   . Spinal stenosis   . Chronic back pain   . Warfarin anticoagulation   . Chronic anxiety     Patient describes as stress  . Tobacco abuse     Past Surgical History  Procedure Date  . Ankle surgery     x2  . Cholecystectomy   . Carpal tunnel release   . Ovarian cyst removal   . Rotator cuff repair   . Tubal ligation   . Appendectomy     Family History  Problem Relation Age of Onset  . Arrhythmia Mother    . Stroke Father     Deceased    History   Social History  . Marital Status: Married    Spouse Name: N/A    Number of Children: N/A  . Years of Education: N/A   Occupational History  . EMT     Volunteer  . Dialysis Tech     Retired   Social History Main Topics  . Smoking status: Current Every Day Smoker -- 1.0 packs/day for 40 years    Types: Cigarettes  . Smokeless tobacco: Not on file  . Alcohol Use: No  . Drug Use: Not on file  . Sexually Active: Not on file   Other Topics Concern  . Not on file   Social History Narrative  . No narrative on file    ZOX:WRUEAV of systems complete and found to be negative unless listed above   PHYSICAL EXAM BP 128/72  Pulse 86  Ht 5\' 7"  (1.702 m)  Wt 259 lb 12.8 oz (117.845 kg)  BMI 40.69 kg/m2  General: Well developed, well nourished, in no acute distress Lungs: Clear bilaterally to auscultation and percussion. Heart: HRRR S1 S2, without MRG.  Pulses are 2+ & equal.            No  carotid bruit. No JVD.  No abdominal bruits. No femoral bruits. Abdomen: Bowel sounds are positive, abdomen soft and non-tender without masses or                  Hernia's noted. Msk:  Back normal, normal gait. Normal strength and tone for age. Neuro: Alert and oriented X 3. Psych:  Good affect, responds appropriately    ASSESSMENT AND PLAN

## 2012-01-12 NOTE — Patient Instructions (Addendum)
Your physician recommends that you continue on your current medications as directed. Please refer to the Current Medication list given to you today.  Your physician wants you to follow-up in: 1 year with [facility]. You will receive a reminder letter in the mail two months in advance. If you don't receive a letter, please call our office to schedule the follow-up appointment.

## 2012-01-12 NOTE — Assessment & Plan Note (Signed)
Patient remains in normal sinus rhythm on Cardizem dose. She is tolerating the medication well without complaints. She has no dizziness or presyncope. Echocardiogram completed during hospitalization revealed systolic function vigorous with an estimated ejection fraction of 75-80% while in atrial fibrillation. She had grade 1 diastolic dysfunction. Left atrium was mildly dilated. RV hypertrophy was present with systolic function normal.  Nuclear pharmacologic stress myocardial study revealed normal LV systolic function with no stress-induced EKG abnormalities normal myocardial perfusion except for the present of breast attenuation artifact no other findings were found.  This is been explained to her. Questions have been answered. We will see her again in one year unless she becomes symptomatic. She will continue to follow with her primary care physician and we will be happy to see her sooner should he find that this is necessary.

## 2012-01-26 DIAGNOSIS — I82409 Acute embolism and thrombosis of unspecified deep veins of unspecified lower extremity: Secondary | ICD-10-CM | POA: Diagnosis not present

## 2012-01-26 DIAGNOSIS — I2699 Other pulmonary embolism without acute cor pulmonale: Secondary | ICD-10-CM | POA: Diagnosis not present

## 2012-02-01 ENCOUNTER — Ambulatory Visit: Payer: Medicare Other | Attending: Family Medicine | Admitting: Sleep Medicine

## 2012-02-01 DIAGNOSIS — G4733 Obstructive sleep apnea (adult) (pediatric): Secondary | ICD-10-CM | POA: Diagnosis not present

## 2012-02-01 DIAGNOSIS — G4700 Insomnia, unspecified: Secondary | ICD-10-CM

## 2012-02-01 DIAGNOSIS — G4710 Hypersomnia, unspecified: Secondary | ICD-10-CM | POA: Diagnosis not present

## 2012-02-01 DIAGNOSIS — Z6841 Body Mass Index (BMI) 40.0 and over, adult: Secondary | ICD-10-CM | POA: Insufficient documentation

## 2012-02-05 NOTE — Procedures (Signed)
[facility] Takyra Cantrall A. Gerilyn Pilgrim, MD     [email or web address]        NAMEQUINLYNN, CUTHBERT               ACCOUNT NO.:  [id number]  MEDICAL RECORD NO.:  [id number]          PATIENT TYPE:  OUT  LOCATION:  SLEEP LAB                     FACILITY:  [facility]  PHYSICIAN:  Ramesha Poster A. Gerilyn Pilgrim, M.D. DATE OF BIRTH:  1951-03-19  DATE OF STUDY:  02/01/2012                           NOCTURNAL POLYSOMNOGRAM  REFERRING PHYSICIAN:  Merlyn Albert  INDICATION:  This is a 61 year old who presents with loud snoring, witnessed apnea, and hypersomnia.  MEDICATIONS:  Wellbutrin, Coumadin, Protonix, Cardizem, hydrocodone.  EPWORTH SLEEPINESS SCALE:  9.  BMI 41.  ARCHITECTURAL SUMMARY:  This is a full night recording diagnostic study. The total recording time is 419 minutes.  Sleep efficiency low at 50%. Sleep latency 48 minutes.  REM latency 142 minutes.  Stage N1 90%, N2 62%, N3 0.5%, and REM sleep 19%.  RESPIRATORY SUMMARY:  Baseline oxygen saturation is 96, lowest saturation 85 during REM sleep.  Diagnostic AHI is 32 and RDI 35.  Most of the events occurred during the last 2 hours of sleep and therefore the patient could not be titrated.  The apneic events are somewhat worse during REM sleep with a REM index being 66.  LIMB MOVEMENT SUMMARY:  PLM index 5.4.  ELECTROCARDIOGRAM SUMMARY:  Average heart rate 69 with no significant dysrhythmias observed.  IMPRESSION:  Moderate obstructive sleep apnea syndrome worse during REM sleep.  The patient could not be titrated because most of the events occurring during the last 2 hours of sleep.  RECOMMENDATION:  Formal CPAP titration recording.  Thanks for this referral.   Prisilla Kocsis A. Gerilyn Pilgrim, M.D.    KAD/MEDQ  D:  02/05/2012 08:47:55  T:  02/05/2012 11:47:46  Job:  [id number]

## 2012-02-16 DIAGNOSIS — I82409 Acute embolism and thrombosis of unspecified deep veins of unspecified lower extremity: Secondary | ICD-10-CM | POA: Diagnosis not present

## 2012-02-26 DIAGNOSIS — G4730 Sleep apnea, unspecified: Secondary | ICD-10-CM | POA: Diagnosis not present

## 2012-02-26 DIAGNOSIS — Z23 Encounter for immunization: Secondary | ICD-10-CM | POA: Diagnosis not present

## 2012-03-15 DIAGNOSIS — I82409 Acute embolism and thrombosis of unspecified deep veins of unspecified lower extremity: Secondary | ICD-10-CM | POA: Diagnosis not present

## 2012-04-12 DIAGNOSIS — I824Z9 Acute embolism and thrombosis of unspecified deep veins of unspecified distal lower extremity: Secondary | ICD-10-CM | POA: Diagnosis not present

## 2012-05-11 ENCOUNTER — Encounter (HOSPITAL_COMMUNITY): Payer: Self-pay

## 2012-05-11 ENCOUNTER — Inpatient Hospital Stay (HOSPITAL_COMMUNITY)
Admission: EM | Admit: 2012-05-11 | Discharge: 2012-05-13 | DRG: 309 | Disposition: A | Discharge status: Home / Self Care | Payer: Medicare Other | Attending: Internal Medicine | Admitting: Internal Medicine

## 2012-05-11 ENCOUNTER — Emergency Department (HOSPITAL_COMMUNITY): Payer: Medicare Other

## 2012-05-11 DIAGNOSIS — F419 Anxiety disorder, unspecified: Secondary | ICD-10-CM

## 2012-05-11 DIAGNOSIS — I2782 Chronic pulmonary embolism: Secondary | ICD-10-CM | POA: Diagnosis present

## 2012-05-11 DIAGNOSIS — I4891 Unspecified atrial fibrillation: Secondary | ICD-10-CM | POA: Diagnosis not present

## 2012-05-11 DIAGNOSIS — Z86711 Personal history of pulmonary embolism: Secondary | ICD-10-CM | POA: Diagnosis not present

## 2012-05-11 DIAGNOSIS — F411 Generalized anxiety disorder: Secondary | ICD-10-CM

## 2012-05-11 DIAGNOSIS — M4800 Spinal stenosis, site unspecified: Secondary | ICD-10-CM | POA: Diagnosis present

## 2012-05-11 DIAGNOSIS — F17200 Nicotine dependence, unspecified, uncomplicated: Secondary | ICD-10-CM | POA: Diagnosis present

## 2012-05-11 DIAGNOSIS — Z79899 Other long term (current) drug therapy: Secondary | ICD-10-CM

## 2012-05-11 DIAGNOSIS — I82509 Chronic embolism and thrombosis of unspecified deep veins of unspecified lower extremity: Secondary | ICD-10-CM | POA: Diagnosis present

## 2012-05-11 DIAGNOSIS — Z720 Tobacco use: Secondary | ICD-10-CM

## 2012-05-11 DIAGNOSIS — K219 Gastro-esophageal reflux disease without esophagitis: Secondary | ICD-10-CM | POA: Diagnosis not present

## 2012-05-11 DIAGNOSIS — R0789 Other chest pain: Secondary | ICD-10-CM

## 2012-05-11 DIAGNOSIS — M549 Dorsalgia, unspecified: Secondary | ICD-10-CM | POA: Diagnosis present

## 2012-05-11 DIAGNOSIS — Z7901 Long term (current) use of anticoagulants: Secondary | ICD-10-CM

## 2012-05-11 DIAGNOSIS — R072 Precordial pain: Secondary | ICD-10-CM | POA: Diagnosis not present

## 2012-05-11 DIAGNOSIS — I872 Venous insufficiency (chronic) (peripheral): Secondary | ICD-10-CM | POA: Diagnosis present

## 2012-05-11 DIAGNOSIS — G8929 Other chronic pain: Secondary | ICD-10-CM | POA: Diagnosis present

## 2012-05-11 LAB — CBC WITH DIFFERENTIAL/PLATELET
Basophils Absolute: 0 10*3/uL (ref 0.0–0.1)
Basophils Relative: 1 % (ref 0–1)
Eosinophils Absolute: 0.1 10*3/uL (ref 0.0–0.7)
Eosinophils Relative: 1 % (ref 0–5)
MCH: 31.6 pg (ref 26.0–34.0)
MCHC: 33.5 g/dL (ref 30.0–36.0)
MCV: 94.4 fL (ref 78.0–100.0)
Platelets: 198 10*3/uL (ref 150–400)
RDW: 13.2 % (ref 11.5–15.5)
WBC: 5.2 10*3/uL (ref 4.0–10.5)

## 2012-05-11 LAB — TROPONIN I
Troponin I: <0.3 ng/mL (ref <0.30)
Troponin I: <0.3 ng/mL (ref <0.30)
Troponin I: <0.3 ng/mL (ref <0.30)

## 2012-05-11 LAB — BASIC METABOLIC PANEL
Calcium: 9.1 mg/dL (ref 8.4–10.5)
GFR calc non Af Amer: 75 mL/min — ABNORMAL LOW (ref >90)
Sodium: 137 mEq/L (ref 135–145)

## 2012-05-11 LAB — PROTIME-INR: INR: 3.73 — ABNORMAL HIGH (ref 0.00–1.49)

## 2012-05-11 MED ORDER — WARFARIN SODIUM 2.5 MG PO TABS: 2.5000 mg | ORAL_TABLET | Freq: Every day | ORAL | Status: DC

## 2012-05-11 MED ORDER — SODIUM CHLORIDE 0.9 % IJ SOLN
3.0000 mL | Freq: Two times a day (BID) | INTRAMUSCULAR | Status: DC
  Administered 2012-05-11 – 2012-05-12 (×2): 3 mL via INTRAVENOUS

## 2012-05-11 MED ORDER — HYDROCODONE-ACETAMINOPHEN 5-325 MG PO TABS: 1.0000 | ORAL_TABLET | ORAL | Status: DC | PRN

## 2012-05-11 MED ORDER — SODIUM CHLORIDE 0.9 % IV BOLUS (SEPSIS)
500.0000 mL | Freq: Once | INTRAVENOUS | Status: AC
  Administered 2012-05-11: 500 mL via INTRAVENOUS

## 2012-05-11 MED ORDER — ONDANSETRON HCL 4 MG/2ML IJ SOLN
4.0000 mg | Freq: Four times a day (QID) | INTRAMUSCULAR | Status: DC | PRN
  Administered 2012-05-12: 4 mg via INTRAVENOUS
  Filled 2012-05-11: qty 2

## 2012-05-11 MED ORDER — BUPROPION HCL ER (SR) 150 MG PO TB12
ORAL_TABLET | ORAL | Status: AC
  Filled 2012-05-11: qty 2

## 2012-05-11 MED ORDER — METOPROLOL TARTRATE 25 MG PO TABS
25.0000 mg | ORAL_TABLET | Freq: Two times a day (BID) | ORAL | Status: DC
  Administered 2012-05-11 (×2): 25 mg via ORAL
  Filled 2012-05-11 (×2): qty 1

## 2012-05-11 MED ORDER — ONDANSETRON HCL 4 MG PO TABS: 4.0000 mg | ORAL_TABLET | Freq: Four times a day (QID) | ORAL | Status: DC | PRN

## 2012-05-11 MED ORDER — DILTIAZEM HCL 50 MG/10ML IV SOLN
10.0000 mg | Freq: Once | INTRAVENOUS | Status: AC
  Administered 2012-05-11: 10 mg via INTRAVENOUS

## 2012-05-11 MED ORDER — BUPROPION HCL ER (SR) 150 MG PO TB12
150.0000 mg | ORAL_TABLET | Freq: Two times a day (BID) | ORAL | Status: DC
  Administered 2012-05-11 – 2012-05-13 (×4): 150 mg via ORAL
  Filled 2012-05-11 (×9): qty 1

## 2012-05-11 MED ORDER — PANTOPRAZOLE SODIUM 40 MG PO TBEC
40.0000 mg | DELAYED_RELEASE_TABLET | Freq: Every day | ORAL | Status: DC
  Administered 2012-05-12 – 2012-05-13 (×2): 40 mg via ORAL
  Filled 2012-05-11 (×2): qty 1

## 2012-05-11 MED ORDER — WARFARIN - PHARMACIST DOSING INPATIENT: Freq: Every day | Status: DC

## 2012-05-11 MED ORDER — DILTIAZEM HCL 100 MG IV SOLR
5.0000 mg/h | INTRAVENOUS | Status: DC
  Administered 2012-05-11: 10 mg/h via INTRAVENOUS
  Administered 2012-05-11: 5 mg/h via INTRAVENOUS

## 2012-05-11 NOTE — Progress Notes (Signed)
ANTICOAGULATION CONSULT NOTE - Initial Consult  Pharmacy Consult for Coumadin Indication: history of DVT/PE  Allergies  Allergen Reactions  . Aspirin Nausea And Vomiting    Patient Measurements: Height: 5\' 7"  (170.2 cm) Weight: 259 lb 7.7 oz (117.7 kg) IBW/kg (Calculated) : 61.6   Vital Signs: Temp: 99.1 F (37.3 C) (01/11 1603) Temp src: Oral (01/11 1603) BP: 136/124 mmHg (01/11 1700) Pulse Rate: 84  (01/11 1500)  Labs:  Basename 05/11/12 1648 05/11/12 1508 05/11/12 1138  HGB -- -- 14.7  HCT -- -- 43.9  PLT -- -- 198  APTT -- -- --  LABPROT 34.7* -- --  INR 3.73* -- --  HEPARINUNFRC -- -- --  CREATININE -- -- 0.83  CKTOTAL -- -- --  CKMB -- -- --  TROPONINI -- <0.30 <0.30    Estimated Creatinine Clearance: 94.4 ml/min (by C-G formula based on Cr of 0.83).   Medical History: Past Medical History  Diagnosis Date  . DVT (deep venous thrombosis)   . GERD (gastroesophageal reflux disease)   . Arthritis   . Lower extremity venous stasis     LLE, chronic  . Pulmonary embolism   . Spinal stenosis   . Chronic back pain   . Warfarin anticoagulation   . Chronic anxiety     Patient describes as stress  . Tobacco abuse   . Atrial fibrillation     Medications:  Scheduled:    . buPROPion  150 mg Oral BID  . [COMPLETED] diltiazem  10 mg Intravenous Once  . metoprolol tartrate  25 mg Oral BID  . pantoprazole  40 mg Oral Daily  . [COMPLETED] sodium chloride  500 mL Intravenous Once  . sodium chloride  3 mL Intravenous Q12H  . Warfarin - Pharmacist Dosing Inpatient   Does not apply q1800  . [DISCONTINUED] warfarin  2.5-5 mg Oral Daily    Assessment: 62 yo F on chronic warfarin 5mg  daily except 2.5mg  on MWF for hx of VTE.  INR is supra-therapeutic on admission. No bleeding noted.   Goal of Therapy:  INR 2-3   Plan:  1) Hold Coumadin tonight 2) Daily INR  Elson Clan 05/11/2012,5:36 PM

## 2012-05-11 NOTE — ED Provider Notes (Signed)
History    Scribed for Donnetta Hutching, MD, the patient was seen in room APA05/APA05. This chart was scribed by Katha Cabal.  CSN: [id number]  Arrival date & time 05/11/12  1111   First MD Initiated Contact with Patient 05/11/12 1132      Chief Complaint  Patient presents with  . Tachycardia    (Consider location/radiation/quality/duration/timing/severity/associated sxs/prior treatment) HPI Donnetta Hutching, MD entered patient's room at 11:36 AM   KAYELA HUMPHRES is a 62 y.o. female with a history of A Fib presents to the Emergency Department complaining of persistence of constant fast palpitations that begun about an prior to arrival while the patient was sitting on the couch.  Patient reports similar symptoms of previously in August and was hospitalized for 23 hours for atrial fibrillation.  Patient states that symptoms are similar to previous episode of atrial fibrillation.   Symptoms are not associated with shortness of breath or dizziness.    PCP Harlow Asa, MD [facility]       Past Medical History  Diagnosis Date  . DVT (deep venous thrombosis)   . GERD (gastroesophageal reflux disease)   . Arthritis   . Lower extremity venous stasis     LLE, chronic  . Pulmonary embolism   . Spinal stenosis   . Chronic back pain   . Warfarin anticoagulation   . Chronic anxiety     Patient describes as stress  . Tobacco abuse   . Atrial fibrillation     Past Surgical History  Procedure Date  . Ankle surgery     x2  . Cholecystectomy   . Carpal tunnel release   . Ovarian cyst removal   . Rotator cuff repair   . Tubal ligation   . Appendectomy     Family History  Problem Relation Age of Onset  . Arrhythmia Mother   . Stroke Father     Deceased    History  Substance Use Topics  . Smoking status: Current Every Day Smoker -- 1.0 packs/day for 40 years    Types: Cigarettes  . Smokeless tobacco: Not on file  . Alcohol Use: No    OB History    Grav Para Term  Preterm Abortions TAB SAB Ect Mult Living                  Review of Systems  All other systems reviewed and are negative.   Remaining review of systems negative except as noted in the HPI.   Allergies  Aspirin  Home Medications   Current Outpatient Rx  Name  Route  Sig  Dispense  Refill  . BUPROPION HCL ER (SR) 150 MG PO TB12   Oral   Take 150 mg by mouth 2 (two) times daily.         Marland Kitchen DILTIAZEM HCL ER COATED BEADS 120 MG PO CP24   Oral   Take 1 capsule (120 mg total) by mouth daily.   30 capsule   0   . HYDROCODONE-ACETAMINOPHEN 5-500 MG PO CAPS   Oral   Take 1 capsule by mouth every 6 (six) hours as needed. For pain         . PANTOPRAZOLE SODIUM 40 MG PO TBEC   Oral   Take 40 mg by mouth every evening.         . WARFARIN SODIUM 5 MG PO TABS   Oral   Take 2.5-5 mg by mouth as directed. *Take one-half tablet (2.5mg ) by  mouth on Mondays and Fridays. Take one tablet (5mg ) on all other days           BP 117/73  Pulse 142  Temp 97.9 F (36.6 C) (Oral)  Resp 17  SpO2 92%  Physical Exam  Nursing note and vitals reviewed. Constitutional: She is oriented to person, place, and time. She appears well-developed and well-nourished.  HENT:  Head: Normocephalic and atraumatic.  Eyes: Conjunctivae normal and EOM are normal. Pupils are equal, round, and reactive to light.  Neck: Normal range of motion. Neck supple.  Cardiovascular: Normal rate and normal heart sounds.  An irregularly irregular rhythm present.  Pulmonary/Chest: Effort normal and breath sounds normal.  Abdominal: Soft. Bowel sounds are normal.  Musculoskeletal: Normal range of motion.  Neurological: She is alert and oriented to person, place, and time.  Skin: Skin is warm and dry.  Psychiatric: She has a normal mood and affect.    ED Course  Procedures (including critical care time)    DIAGNOSTIC STUDIES: Oxygen Saturation is 92% on adequate by my interpretation.     COORDINATION OF  CARE: 11:41 AM  Physical exam complete.  Will order CXR and Troponin and continue to observe patient.   11:45 AM  Cardizem and IV fluids ordered.       LABS / RADIOLOGY:   Labs Reviewed  BASIC METABOLIC PANEL - Abnormal; Notable for the following:    Glucose, Bld 105 (*)     GFR calc non Af Amer 75 (*)     GFR calc Af Amer 86 (*)     All other components within normal limits  CBC WITH DIFFERENTIAL  TROPONIN I   Dg Chest Port 1 View  05/11/2012  *RADIOLOGY REPORT*  Clinical Data: Tachycardia, antral fibrillation  PORTABLE CHEST - 1 VIEW  Comparison: 12/28/2011  Findings: Monitor leads overlie the chest.  Stable heart size and vascularity.  Negative for CHF or pneumonia.  No effusion or pneumothorax.  Trachea is midline.  Previous right rotator cuff repair evident.  IMPRESSION: Stable exam.  No acute process.   Original Report Authenticated By: Judie Petit. Miles Costain, M.D.         IMPRESSION: No diagnosis found.   NEW MEDICATIONS: New Prescriptions   No medications on file      Date: 05/11/2012  Rate: 145  Rhythm: atrial fibrillation  QRS Axis: normal  Intervals: normal  ST/T Wave abnormalities: normal  Conduction Disutrbances:none  Narrative Interpretation:   Old EKG Reviewed: changes noted  CRITICAL CARE Performed by: Donnetta Hutching   Total critical care time: 30  Critical care time was exclusive of separately billable procedures and treating other patients.  Critical care was necessary to treat or prevent imminent or life-threatening deterioration.  Critical care was time spent personally by me on the following activities: development of treatment plan with patient and/or surrogate as well as nursing, discussions with consultants, evaluation of patient's response to treatment, examination of patient, obtaining history from patient or surrogate, ordering and performing treatments and interventions, ordering and review of laboratory studies, ordering and review of radiographic  studies, pulse oximetry and re-evaluation of patient's condition.      MDM  Patient has been in atrial fibrillation in the past, but it was corrected. Today rate is in the 140-150 range.  EKG shows atrial fib.  Start IV Cardizem.    Admit     I personally performed the services described in this documentation, which was scribed in my presence. The recorded information has  been reviewed and is accurate.   Donnetta Hutching, MD 05/11/12 1416

## 2012-05-11 NOTE — ED Notes (Signed)
Report given to Marcelino Duster, RN ICU. Ready to receive patient.

## 2012-05-11 NOTE — H&P (Signed)
[facility] History and Physical  Sarah [name] VWU:[id number] DOB: 02-16-1951 DOA: 05/11/2012  Referring physician: Dr. Donnetta Hutching, ER physician. PCP: Harlow Asa, MD  Specialists: Dr. Dietrich Pates, cardiology.  Chief Complaint: Palpitations, burning in her chest.  HPI: Sarah [name] is a 62 y.o. female describes similar symptoms that she had in August of last year, which consists of palpitations, irregular, associated with burning in her chest. These symptoms started at 10 AM this morning. There has been no diaphoresis associated with them or lightheadedness or loss of consciousness. In August of 2013 patient presented with similar symptoms and was found to have atrial fibrillation with rapid ventricular response. She cardioverted within 24 hours spontaneously and was able to be discharged home. She was seen in the outpatient cardiology clinic and further studies, including echocardiogram and nuclear stress test showed showed ejection fraction 75-80%, grade 1 diastolic dysfunction and mild dilatation of the left atrium and normal LV systolic function with no ischemia. She has been on anticoagulation with Coumadin for several years secondary to thromboembolic disease.  Review of Systems:   Apart from history of present illness other systems negative.  Past Medical History  Diagnosis Date  . DVT (deep venous thrombosis)   . GERD (gastroesophageal reflux disease)   . Arthritis   . Lower extremity venous stasis     LLE, chronic  . Pulmonary embolism   . Spinal stenosis   . Chronic back pain   . Warfarin anticoagulation   . Chronic anxiety     Patient describes as stress  . Tobacco abuse   . Atrial fibrillation    Past Surgical History  Procedure Date  . Ankle surgery     x2  . Cholecystectomy   . Carpal tunnel release   . Ovarian cyst removal   . Rotator cuff repair   . Tubal ligation   . Appendectomy    Social History:  She is married and lives with her husband. She  does not drink alcohol. She is a retired Museum/gallery exhibitions officer. She apparently smokes cigarettes one pack or more per day.  Allergies  Allergen Reactions  . Aspirin Nausea And Vomiting    Family History  Problem Relation Age of Onset  . Arrhythmia Mother   . Stroke Father     Deceased      Prior to Admission medications   Medication Sig Start Date End Date Taking? Authorizing Provider  buPROPion (WELLBUTRIN SR) 150 MG 12 hr tablet Take 150 mg by mouth 2 (two) times daily.   Yes Historical Provider, MD  diltiazem (CARDIZEM CD) 120 MG 24 hr capsule Take 1 capsule (120 mg total) by mouth daily. 12/29/11 12/28/12 Yes Christiane Ha, MD  hydrocodone-acetaminophen (LORCET-HD) 5-500 MG per capsule Take 1 capsule by mouth every 6 (six) hours as needed. For pain   Yes Historical Provider, MD  pantoprazole (PROTONIX) 40 MG tablet Take 40 mg by mouth daily.    Yes Historical Provider, MD  warfarin (COUMADIN) 5 MG tablet Take 2.5-5 mg by mouth daily. *Take one-half tablet (2.5mg ) by mouth on Mondays, Wednesday, and Fridays. Take one tablet (5mg ) on all other days   Yes Historical Provider, MD   Physical Exam: Filed Vitals:   05/11/12 1317 05/11/12 1330 05/11/12 1345 05/11/12 1400  BP: 87/63 105/67 115/80 108/64  Pulse: 138 150 80 75  Temp:      TempSrc:      Resp: 19 17 18 22   SpO2: 94% 93% 95% 95%  General:  She looks systemically well. She is not in any pain.  Eyes: No jaundice. No pallor.  ENT: No abnormalities.  Neck: No lymphadenopathy.  Cardiovascular: Heart sounds are irregularly irregular. There are no murmurs.  Respiratory: Lung fields are clear.  Abdomen: Soft, nontender.  Skin: No rash.  Musculoskeletal: No joint abnormalities.  Psychiatric: Appropriate affect.  Neurologic: Alert and orientated without any focal neurological signs.  Labs on Admission:  Basic Metabolic Panel:  Lab 05/11/12 4782  NA 137  K 3.8  CL 102  CO2 27  GLUCOSE 105*  BUN 9  CREATININE 0.83    CALCIUM 9.1  MG --  PHOS --       CBC:  Lab 05/11/12 1138  WBC 5.2  NEUTROABS 3.1  HGB 14.7  HCT 43.9  MCV 94.4  PLT 198   Cardiac Enzymes:  Lab 05/11/12 1138  CKTOTAL --  CKMB --  CKMBINDEX --  TROPONINI <0.30       Radiological Exams on Admission: Dg Chest Port 1 View  05/11/2012  *RADIOLOGY REPORT*  Clinical Data: Tachycardia, antral fibrillation  PORTABLE CHEST - 1 VIEW  Comparison: 12/28/2011  Findings: Monitor leads overlie the chest.  Stable heart size and vascularity.  Negative for CHF or pneumonia.  No effusion or pneumothorax.  Trachea is midline.  Previous right rotator cuff repair evident.  IMPRESSION: Stable exam.  No acute process.   Original Report Authenticated By: Judie Petit. Miles Costain, M.D.     EKG: Independently reviewed. Atrial fibrillation. No acute ST-T wave changes.  Assessment/Plan   1. Atrial fibrillation with rapid ventricular response. 2. History of thromboembolic disease, DVT and pulmonary embolism in the past. 3. Chronic anticoagulation with warfarin. 4. Chronic anxiety.  Plan: 1. Admit to step down unit. 2. Cardizem drip. 3. Add metoprolol orally. 4. Serial troponin levels. 5. Continue with Coumadin. Further recommendations will depend on patient's hospital progress.  Code Status: Full code.   Family Communication: Discussed plan with patient at bedside.   Disposition Plan: Home when medically stable.   Time spent: 45 minutes.  Wilson Singer [facility] Pager [phone].  If 7PM-7AM, please contact night-coverage [email or web address] Password [email or web address] 05/11/2012, 3:10 PM

## 2012-05-11 NOTE — ED Notes (Signed)
Pt reports approx 1 hour ago was sitting on the couch and felt heart racing, c/o pain in throat and neck.  Reports history of rapid afib last August.  Reports BP was 138/78.  Denies any dizziness or SOB.

## 2012-05-11 NOTE — ED Notes (Signed)
Patient ambulatory to restroom with steady gait. Urine specimen obtained. 

## 2012-05-12 LAB — CBC
MCH: 31.4 pg (ref 26.0–34.0)
MCHC: 32.9 g/dL (ref 30.0–36.0)
Platelets: 211 10*3/uL (ref 150–400)
RBC: 4.43 MIL/uL (ref 3.87–5.11)
RDW: 13.3 % (ref 11.5–15.5)

## 2012-05-12 LAB — COMPREHENSIVE METABOLIC PANEL
AST: 18 U/L (ref 0–37)
CO2: 32 mEq/L (ref 19–32)
Chloride: 103 mEq/L (ref 96–112)
Creatinine, Ser: 1.02 mg/dL (ref 0.50–1.10)
GFR calc non Af Amer: 58 mL/min — ABNORMAL LOW (ref >90)
Total Bilirubin: 0.3 mg/dL (ref 0.3–1.2)

## 2012-05-12 LAB — PROTIME-INR: Prothrombin Time: 22.1 seconds — ABNORMAL HIGH (ref 11.6–15.2)

## 2012-05-12 MED ORDER — WARFARIN SODIUM 5 MG PO TABS
5.0000 mg | ORAL_TABLET | Freq: Once | ORAL | Status: AC
  Administered 2012-05-12: 5 mg via ORAL
  Filled 2012-05-12: qty 1

## 2012-05-12 MED ORDER — GUAIFENESIN ER 600 MG PO TB12
600.0000 mg | ORAL_TABLET | Freq: Two times a day (BID) | ORAL | Status: DC | PRN
  Administered 2012-05-12: 600 mg via ORAL
  Filled 2012-05-12: qty 1

## 2012-05-12 MED ORDER — DILTIAZEM HCL ER COATED BEADS 180 MG PO CP24
180.0000 mg | ORAL_CAPSULE | Freq: Every day | ORAL | Status: DC
  Administered 2012-05-12 – 2012-05-13 (×2): 180 mg via ORAL
  Filled 2012-05-12 (×2): qty 1

## 2012-05-12 NOTE — Progress Notes (Signed)
ANTICOAGULATION CONSULT NOTE  Pharmacy Consult for Coumadin Indication: history of DVT/PE  Allergies  Allergen Reactions  . Aspirin Nausea And Vomiting    Patient Measurements: Height: 5\' 7"  (170.2 cm) Weight: 260 lb 2.3 oz (118 kg) IBW/kg (Calculated) : 61.6   Vital Signs: Temp: 98.5 F (36.9 C) (01/12 0400) Temp src: Oral (01/12 0400) BP: 98/67 mmHg (01/12 0600)  Labs:  Basename 05/12/12 0356 05/12/12 0355 05/11/12 2100 05/11/12 1648 05/11/12 1508 05/11/12 1138  HGB -- 13.9 -- -- -- 14.7  HCT -- 42.3 -- -- -- 43.9  PLT -- 211 -- -- -- 198  APTT -- -- -- -- -- --  LABPROT 22.1* -- -- 34.7* -- --  INR 2.03* -- -- 3.73* -- --  HEPARINUNFRC -- -- -- -- -- --  CREATININE -- 1.02 -- -- -- 0.83  CKTOTAL -- -- -- -- -- --  CKMB -- -- -- -- -- --  TROPONINI -- <0.30 <0.30 -- <0.30 --    Estimated Creatinine Clearance: 77 ml/min (by C-G formula based on Cr of 1.02).   Medical History: Past Medical History  Diagnosis Date  . DVT (deep venous thrombosis)   . GERD (gastroesophageal reflux disease)   . Arthritis   . Lower extremity venous stasis     LLE, chronic  . Pulmonary embolism   . Spinal stenosis   . Chronic back pain   . Warfarin anticoagulation   . Chronic anxiety     Patient describes as stress  . Tobacco abuse   . Atrial fibrillation     Medications:  Scheduled:     . buPROPion  150 mg Oral BID  . diltiazem  180 mg Oral Daily  . [COMPLETED] diltiazem  10 mg Intravenous Once  . pantoprazole  40 mg Oral Daily  . [COMPLETED] sodium chloride  500 mL Intravenous Once  . sodium chloride  3 mL Intravenous Q12H  . Warfarin - Pharmacist Dosing Inpatient   Does not apply q1800  . [DISCONTINUED] metoprolol tartrate  25 mg Oral BID  . [DISCONTINUED] warfarin  2.5-5 mg Oral Daily    Assessment: 62 yo F on chronic warfarin 5mg  daily except 2.5mg  on MWF for hx of VTE.  INR was supra-therapeutic on admission but within goal range today after dose held. No  bleeding noted.   Goal of Therapy:  INR 2-3   Plan:  1) Coumadin 5mg  po x1 tonight 2) Daily INR  Bode Pieper, [facility] 05/12/2012,8:49 AM

## 2012-05-12 NOTE — Progress Notes (Signed)
Chart reviewed.  Subjective: Feels nauseated. No chest pain or palpitations. No shortness of breath.  Objective: Vital signs in last 24 hours: Filed Vitals:   05/12/12 0300 05/12/12 0400 05/12/12 0500 05/12/12 0600  BP: 104/59 87/46 100/54 98/67  Pulse:      Temp:  98.5 F (36.9 C)    TempSrc:  Oral    Resp: 20 24 28 23   Height:      Weight:   118 kg (260 lb 2.3 oz)   SpO2:       Weight change:   Intake/Output Summary (Last 24 hours) at 05/12/12 0817 Last data filed at 05/12/12 0600  Gross per 24 hour  Intake 641.75 ml  Output      0 ml  Net 641.75 ml   Lungs clear to auscultation bilaterally without wheezes rhonchi or rales Cardiovascular irregularly irregular without murmurs gallops rubs Abdomen soft nontender nondistended Extremities no clubbing cyanosis or edema  Lab Results: Basic Metabolic Panel:  Lab 05/12/12 1308 05/11/12 1138  NA 141 137  K 4.0 3.8  CL 103 102  CO2 32 27  GLUCOSE 107* 105*  BUN 7 9  CREATININE 1.02 0.83  CALCIUM 8.9 9.1  MG -- --  PHOS -- --   Liver Function Tests:  Lab 05/12/12 0355  AST 18  ALT 20  ALKPHOS 76  BILITOT 0.3  PROT 6.4  ALBUMIN 3.4*   No results found for this basename: LIPASE:2,AMYLASE:2 in the last 168 hours No results found for this basename: AMMONIA:2 in the last 168 hours CBC:  Lab 05/12/12 0355 05/11/12 1138  WBC 6.5 5.2  NEUTROABS -- 3.1  HGB 13.9 14.7  HCT 42.3 43.9  MCV 95.5 94.4  PLT 211 198   Cardiac Enzymes:  Lab 05/12/12 0355 05/11/12 2100 05/11/12 1508  CKTOTAL -- -- --  CKMB -- -- --  CKMBINDEX -- -- --  TROPONINI <0.30 <0.30 <0.30  Thyroid Function Tests:  Lab 05/11/12 1508  TSH 1.202  T4TOTAL --  FREET4 --  T3FREE --  THYROIDAB --   Coagulation:  Lab 05/12/12 0356 05/11/12 1648  LABPROT 22.1* 34.7*  INR 2.03* 3.73*   Micro Results: Recent Results (from the past 240 hour(s))  MRSA PCR SCREENING     Status: Normal   Collection Time   05/11/12  4:02 PM      Component  Value Range Status Comment   MRSA by PCR NEGATIVE  NEGATIVE Final    Studies/Results: Dg Chest Port 1 View  05/11/2012  *RADIOLOGY REPORT*  Clinical Data: Tachycardia, antral fibrillation  PORTABLE CHEST - 1 VIEW  Comparison: 12/28/2011  Findings: Monitor leads overlie the chest.  Stable heart size and vascularity.  Negative for CHF or pneumonia.  No effusion or pneumothorax.  Trachea is midline.  Previous right rotator cuff repair evident.  IMPRESSION: Stable exam.  No acute process.   Original Report Authenticated By: Judie Petit. Miles Costain, M.D.    Scheduled Meds:   . buPROPion  150 mg Oral BID  . metoprolol tartrate  25 mg Oral BID  . pantoprazole  40 mg Oral Daily  . sodium chloride  3 mL Intravenous Q12H  . Warfarin - Pharmacist Dosing Inpatient   Does not apply q1800   Continuous Infusions:   . diltiazem (CARDIZEM) infusion 5 mg/hr (05/12/12 0600)   PRN Meds:.HYDROcodone-acetaminophen, ondansetron (ZOFRAN) IV, ondansetron Assessment/Plan: Active Problems:  Atrial fibrillation with RVR: Rate is now controlled. Will discontinue Cardizem drip. Stop metoprolol. Increase diltiazem to 180 mg  daily and monitor for further tachycardia or hypotension.  History of pulmonary embolism  Chronic anxiety  Warfarin anticoagulation Nausea: Zofran as needed.  Move to telemetry today if she remained stable   LOS: 1 day   Sarah [name] 05/12/2012, 8:17 AM

## 2012-05-12 NOTE — Progress Notes (Signed)
Report called to RN. Pt to be transferred to room 305 per MD order. Pt to be transferred via wheelchair with personal belongings. Pt currently finishing dinner, then will be transferred. Family at bedside.

## 2012-05-12 NOTE — Progress Notes (Signed)
Dr Lendell Caprice paged and made aware that pts BP and HR have been WNL. MD voiced this am that if pts VS stay stable should could transfer out of ICU.

## 2012-05-12 NOTE — Progress Notes (Signed)
Dr Lendell Caprice paged and notified that pt has a cough and is requesting something. States she normally takes Mucinex at home.

## 2012-05-13 DIAGNOSIS — K219 Gastro-esophageal reflux disease without esophagitis: Secondary | ICD-10-CM

## 2012-05-13 LAB — PROTIME-INR: INR: 1.73 — ABNORMAL HIGH (ref 0.00–1.49)

## 2012-05-13 MED ORDER — DILTIAZEM HCL ER COATED BEADS 180 MG PO CP24: 180.0000 mg | ORAL_CAPSULE | Freq: Every day | ORAL | Status: DC

## 2012-05-13 MED ORDER — WARFARIN SODIUM 5 MG PO TABS: 5.0000 mg | ORAL_TABLET | Freq: Once | ORAL | Status: DC

## 2012-05-13 NOTE — Progress Notes (Signed)
ANTICOAGULATION CONSULT NOTE  Pharmacy Consult for Coumadin Indication: history of DVT/PE  Allergies  Allergen Reactions  . Aspirin Nausea And Vomiting    Patient Measurements: Height: 5\' 7"  (170.2 cm) Weight: 260 lb 2.3 oz (118 kg) IBW/kg (Calculated) : 61.6   Vital Signs: Temp: 98.3 F (36.8 C) (01/13 0504) Temp src: Oral (01/13 0504) BP: 127/72 mmHg (01/13 0504) Pulse Rate: 80  (01/13 0504)  Labs:  Alvira Philips 05/13/12 0443 05/12/12 0356 05/12/12 0355 05/11/12 2100 05/11/12 1648 05/11/12 1508 05/11/12 1138  HGB -- -- 13.9 -- -- -- 14.7  HCT -- -- 42.3 -- -- -- 43.9  PLT -- -- 211 -- -- -- 198  APTT -- -- -- -- -- -- --  LABPROT 19.7* 22.1* -- -- 34.7* -- --  INR 1.73* 2.03* -- -- 3.73* -- --  HEPARINUNFRC -- -- -- -- -- -- --  CREATININE -- -- 1.02 -- -- -- 0.83  CKTOTAL -- -- -- -- -- -- --  CKMB -- -- -- -- -- -- --  TROPONINI -- -- <0.30 <0.30 -- <0.30 --    Estimated Creatinine Clearance: 77 ml/min (by C-G formula based on Cr of 1.02).   Medical History: Past Medical History  Diagnosis Date  . DVT (deep venous thrombosis)   . GERD (gastroesophageal reflux disease)   . Arthritis   . Lower extremity venous stasis     LLE, chronic  . Pulmonary embolism   . Spinal stenosis   . Chronic back pain   . Warfarin anticoagulation   . Chronic anxiety     Patient describes as stress  . Tobacco abuse   . Atrial fibrillation     Medications:  Scheduled:     . buPROPion  150 mg Oral BID  . diltiazem  180 mg Oral Daily  . pantoprazole  40 mg Oral Daily  . sodium chloride  3 mL Intravenous Q12H  . [COMPLETED] warfarin  5 mg Oral ONCE-1800  . Warfarin - Pharmacist Dosing Inpatient   Does not apply q1800  . [DISCONTINUED] metoprolol tartrate  25 mg Oral BID    Assessment: 62 yo F on chronic warfarin 5mg  daily except 2.5mg  on MWF for hx of VTE.  INR was supra-therapeutic on admission but within goal range today after dose held x1. INR is sub-therapeutic today.  No bleeding noted.   Goal of Therapy:  INR 2-3   Plan:  1) Coumadin 5mg  po x1 tonight 2) Daily INR  Elson Clan 05/13/2012,8:13 AM

## 2012-05-13 NOTE — Discharge Summary (Signed)
Physician Discharge Summary  Patient ID: Sarah [name] MRN: [id number] DOB/AGE: July 14, 1950 62 y.o.  Admit date: 05/11/2012 Discharge date: 05/13/2012  Discharge Diagnoses:  Active Problems:  Atrial fibrillation with RVR  History of pulmonary embolism  Chronic anxiety  Warfarin anticoagulation     Medication List     As of 05/13/2012  9:54 AM    TAKE these medications         buPROPion 150 MG 12 hr tablet   Commonly known as: WELLBUTRIN SR   Take 150 mg by mouth 2 (two) times daily.      diltiazem 180 MG 24 hr capsule   Commonly known as: CARDIZEM CD   Take 1 capsule (180 mg total) by mouth daily.      hydrocodone-acetaminophen 5-500 MG per capsule   Commonly known as: LORCET-HD   Take 1 capsule by mouth every 6 (six) hours as needed. For pain      pantoprazole 40 MG tablet   Commonly known as: PROTONIX   Take 40 mg by mouth daily.      warfarin 5 MG tablet   Commonly known as: COUMADIN   Take 2.5-5 mg by mouth daily. *Take one-half tablet (2.5mg ) by mouth on Mondays, Wednesday, and Fridays. Take one tablet (5mg ) on all other days        Disposition: 01-Home or Self Care  Discharged Condition: stable  Consults:  none  Labs:   Results for orders placed during the hospital encounter of 05/11/12 (from the past 48 hour(s))  CBC WITH DIFFERENTIAL     Status: Normal   Collection Time   05/11/12 11:38 AM      Component Value Range Comment   WBC 5.2  4.0 - 10.5 K/uL    RBC 4.65  3.87 - 5.11 MIL/uL    Hemoglobin 14.7  12.0 - 15.0 g/dL    HCT 32.4  40.1 - 02.7 %    MCV 94.4  78.0 - 100.0 fL    MCH 31.6  26.0 - 34.0 pg    MCHC 33.5  30.0 - 36.0 g/dL    RDW 25.3  66.4 - 40.3 %    Platelets 198  150 - 400 K/uL    Neutrophils Relative 59  43 - 77 %    Neutro Abs 3.1  1.7 - 7.7 K/uL    Lymphocytes Relative 30  12 - 46 %    Lymphs Abs 1.5  0.7 - 4.0 K/uL    Monocytes Relative 9  3 - 12 %    Monocytes Absolute 0.5  0.1 - 1.0 K/uL    Eosinophils Relative 1  0 - 5 %     Eosinophils Absolute 0.1  0.0 - 0.7 K/uL    Basophils Relative 1  0 - 1 %    Basophils Absolute 0.0  0.0 - 0.1 K/uL   BASIC METABOLIC PANEL     Status: Abnormal   Collection Time   05/11/12 11:38 AM      Component Value Range Comment   Sodium 137  135 - 145 mEq/L    Potassium 3.8  3.5 - 5.1 mEq/L    Chloride 102  96 - 112 mEq/L    CO2 27  19 - 32 mEq/L    Glucose, Bld 105 (*) 70 - 99 mg/dL    BUN 9  6 - 23 mg/dL    Creatinine, Ser 4.74  0.50 - 1.10 mg/dL    Calcium 9.1  8.4 - 25.9 mg/dL  GFR calc non Af Amer 75 (*) >90 mL/min    GFR calc Af Amer 86 (*) >90 mL/min   TROPONIN I     Status: Normal   Collection Time   05/11/12 11:38 AM      Component Value Range Comment   Troponin I <0.30  <0.30 ng/mL   TSH     Status: Normal   Collection Time   05/11/12  3:08 PM      Component Value Range Comment   TSH 1.202  0.350 - 4.500 uIU/mL   TROPONIN I     Status: Normal   Collection Time   05/11/12  3:08 PM      Component Value Range Comment   Troponin I <0.30  <0.30 ng/mL   MRSA PCR SCREENING     Status: Normal   Collection Time   05/11/12  4:02 PM      Component Value Range Comment   MRSA by PCR NEGATIVE  NEGATIVE   PROTIME-INR     Status: Abnormal   Collection Time   05/11/12  4:48 PM      Component Value Range Comment   Prothrombin Time 34.7 (*) 11.6 - 15.2 seconds    INR 3.73 (*) 0.00 - 1.49   TROPONIN I     Status: Normal   Collection Time   05/11/12  9:00 PM      Component Value Range Comment   Troponin I <0.30  <0.30 ng/mL   TROPONIN I     Status: Normal   Collection Time   05/12/12  3:55 AM      Component Value Range Comment   Troponin I <0.30  <0.30 ng/mL   COMPREHENSIVE METABOLIC PANEL     Status: Abnormal   Collection Time   05/12/12  3:55 AM      Component Value Range Comment   Sodium 141  135 - 145 mEq/L    Potassium 4.0  3.5 - 5.1 mEq/L    Chloride 103  96 - 112 mEq/L    CO2 32  19 - 32 mEq/L    Glucose, Bld 107 (*) 70 - 99 mg/dL    BUN 7  6 - 23 mg/dL      Creatinine, Ser 1.91  0.50 - 1.10 mg/dL    Calcium 8.9  8.4 - 47.8 mg/dL    Total Protein 6.4  6.0 - 8.3 g/dL    Albumin 3.4 (*) 3.5 - 5.2 g/dL    AST 18  0 - 37 U/L    ALT 20  0 - 35 U/L    Alkaline Phosphatase 76  39 - 117 U/L    Total Bilirubin 0.3  0.3 - 1.2 mg/dL    GFR calc non Af Amer 58 (*) >90 mL/min    GFR calc Af Amer 67 (*) >90 mL/min   CBC     Status: Normal   Collection Time   05/12/12  3:55 AM      Component Value Range Comment   WBC 6.5  4.0 - 10.5 K/uL    RBC 4.43  3.87 - 5.11 MIL/uL    Hemoglobin 13.9  12.0 - 15.0 g/dL    HCT 29.5  62.1 - 30.8 %    MCV 95.5  78.0 - 100.0 fL    MCH 31.4  26.0 - 34.0 pg    MCHC 32.9  30.0 - 36.0 g/dL    RDW 65.7  84.6 - 96.2 %    Platelets 211  150 - 400 K/uL   PROTIME-INR     Status: Abnormal   Collection Time   05/12/12  3:56 AM      Component Value Range Comment   Prothrombin Time 22.1 (*) 11.6 - 15.2 seconds    INR 2.03 (*) 0.00 - 1.49   PROTIME-INR     Status: Abnormal   Collection Time   05/13/12  4:43 AM      Component Value Range Comment   Prothrombin Time 19.7 (*) 11.6 - 15.2 seconds    INR 1.73 (*) 0.00 - 1.49     Diagnostics:  Dg Chest Port 1 View  05/11/2012  *RADIOLOGY REPORT*  Clinical Data: Tachycardia, antral fibrillation  PORTABLE CHEST - 1 VIEW  Comparison: 12/28/2011  Findings: Monitor leads overlie the chest.  Stable heart size and vascularity.  Negative for CHF or pneumonia.  No effusion or pneumothorax.  Trachea is midline.  Previous right rotator cuff repair evident.  IMPRESSION: Stable exam.  No acute process.   Original Report Authenticated By: Judie Petit. Shick, M.D.    EKG: atrial fibrillation  Full Code   Hospital Course: See H&P for complete admission details. The patient is a 62 year old white female with previous history of atrial fibrillation. She presented with palpitations. She was found to be in H. fibrillation with rapid ventricular response. She was started on a Cardizem drip. She had been on  long-acting Cardizem prior to admission. She converted back to sinus rhythm. She was converted back to oral long-acting Cardizem at higher dose. She had no chest pain or shortness of breath. She may followup with Dr. Dietrich Pates as an outpatient. She remains on Coumadin for thromboembolic disease. Total time on the day of discharge greater than 30 minutes.  Discharge Exam:  Blood pressure 127/72, pulse 80, temperature 98.3 F (36.8 C), temperature source Oral, resp. rate 18, height 5\' 7"  (1.702 m), weight 118 kg (260 lb 2.3 oz), SpO2 91.00%. Telemetry: Normal sinus rhythm  Lungs clear to auscultation bilaterally without wheeze rhonchi or rales Cardiovascular regular rate rhythm without murmurs gallops rubs  Signed: Jood Retana L 05/13/2012, 9:54 AM

## 2012-05-13 NOTE — Progress Notes (Signed)
Patient states understanding of discharge instructions, prescription given. 

## 2012-05-15 DIAGNOSIS — I82409 Acute embolism and thrombosis of unspecified deep veins of unspecified lower extremity: Secondary | ICD-10-CM | POA: Diagnosis not present

## 2012-05-15 DIAGNOSIS — D68318 Other hemorrhagic disorder due to intrinsic circulating anticoagulants, antibodies, or inhibitors: Secondary | ICD-10-CM | POA: Diagnosis not present

## 2012-05-15 DIAGNOSIS — A088 Other specified intestinal infections: Secondary | ICD-10-CM | POA: Diagnosis not present

## 2012-05-23 ENCOUNTER — Encounter: Payer: Self-pay | Admitting: Adult Health

## 2012-05-23 ENCOUNTER — Ambulatory Visit (INDEPENDENT_AMBULATORY_CARE_PROVIDER_SITE_OTHER): Payer: Medicare Other | Admitting: Adult Health

## 2012-05-23 VITALS — BP 110/60 | HR 69 | Ht 67.0 in | Wt 260.0 lb

## 2012-05-23 DIAGNOSIS — G4733 Obstructive sleep apnea (adult) (pediatric): Secondary | ICD-10-CM | POA: Diagnosis not present

## 2012-05-23 DIAGNOSIS — I10 Essential (primary) hypertension: Secondary | ICD-10-CM

## 2012-05-23 DIAGNOSIS — I4891 Unspecified atrial fibrillation: Secondary | ICD-10-CM

## 2012-05-23 DIAGNOSIS — F17200 Nicotine dependence, unspecified, uncomplicated: Secondary | ICD-10-CM

## 2012-05-23 DIAGNOSIS — I480 Paroxysmal atrial fibrillation: Secondary | ICD-10-CM

## 2012-05-23 DIAGNOSIS — Z720 Tobacco use: Secondary | ICD-10-CM

## 2012-05-23 NOTE — Assessment & Plan Note (Signed)
Recently diagnosed by Dr. Gerda Diss. She is to have CPAP fitted. Will watch BP once she is routinely using device for need to change dosing if she becomes hypotensive. Repeat echo in one year.

## 2012-05-23 NOTE — Progress Notes (Signed)
   HPI: Sarah [name] is a 62 y/o patient of Dr. Dietrich Pates we are seeing for ongoing assessment and treatment of PAF with RVR in the setting of pneumonia. She remains on coumadin with a history of thromboembolic disease followed by Dr. Gerda Diss. She has recently been diagnosed with OSA by Dr. Gerda Diss and is being planned for CPAP therapy. She comes today without complaints. She is medically compliant   Allergies  Allergen Reactions  . Aspirin Nausea And Vomiting    Current Outpatient Prescriptions  Medication Sig Dispense Refill  . buPROPion (WELLBUTRIN SR) 150 MG 12 hr tablet Take 150 mg by mouth 2 (two) times daily.      Marland Kitchen diltiazem (CARDIZEM CD) 180 MG 24 hr capsule Take 1 capsule (180 mg total) by mouth daily.  30 capsule  0  . hydrocodone-acetaminophen (LORCET-HD) 5-500 MG per capsule Take 1 capsule by mouth every 6 (six) hours as needed. For pain      . pantoprazole (PROTONIX) 40 MG tablet Take 40 mg by mouth daily.       Marland Kitchen warfarin (COUMADIN) 5 MG tablet Take 2.5-5 mg by mouth daily. *Take one-half tablet (2.5mg ) by mouth on Mondays, Wednesday, and Fridays. Take one tablet (5mg ) on all other days        Past Medical History  Diagnosis Date  . DVT (deep venous thrombosis)   . GERD (gastroesophageal reflux disease)   . Arthritis   . Lower extremity venous stasis     LLE, chronic  . Pulmonary embolism   . Spinal stenosis   . Chronic back pain   . Warfarin anticoagulation   . Chronic anxiety     Patient describes as stress  . Tobacco abuse   . Atrial fibrillation     Past Surgical History  Procedure Date  . Ankle surgery     x2  . Cholecystectomy   . Carpal tunnel release   . Ovarian cyst removal   . Rotator cuff repair   . Tubal ligation   . Appendectomy     UJW:JXBJYN of systems complete and found to be negative unless listed above   PHYSICAL EXAM BP 110/60  Pulse 69  Ht 5\' 7"  (1.702 m)  Wt 260 lb (117.935 kg)  BMI 40.72 kg/m2  General: Well developed, well  nourished, in no acute distress, obese Head: Eyes PERRLA, No xanthomas.   Normal cephalic and atramatic  Lungs: Clear bilaterally to auscultation and percussion. Heart: HRRR S1 S2, without MRG.  Pulses are 2+ & equal.            No carotid bruit. No JVD.  No abdominal bruits. No femoral bruits. Abdomen: Bowel sounds are positive, abdomen soft and non-tender without masses or                  Hernia's noted. Msk:  Back normal, normal gait. Normal strength and tone for age. Extremities: No clubbing, cyanosis or edema.  DP +1 Neuro: Alert and oriented X 3. Psych:  Good affect, responds appropriately   ASSESSMENT AND PLAN

## 2012-05-23 NOTE — Assessment & Plan Note (Signed)
Smoking cessation recommended 

## 2012-05-23 NOTE — Patient Instructions (Addendum)
Your physician recommends that you schedule a follow-up appointment in: 6 MONTHS WITH KL   

## 2012-05-23 NOTE — Progress Notes (Deleted)
Name: Sarah [name]    DOB: [birth year]  Age: 62 y.o.  MR#: [id number]       PCP:  Harlow Asa, MD      Insurance: @PAYORNAME @   CC:   No chief complaint on file.   VS BP 110/60  Pulse 69  Ht 5\' 7"  (1.702 m)  Wt 260 lb (117.935 kg)  BMI 40.72 kg/m2  Weights Current Weight  05/23/12 260 lb (117.935 kg)  05/12/12 260 lb 2.3 oz (118 kg)  01/12/12 259 lb 12.8 oz (117.845 kg)    Blood Pressure  BP Readings from Last 3 Encounters:  05/23/12 110/60  05/13/12 127/72  01/12/12 128/72     Admit date:  (Not on file) Last encounter with RMR:  01/12/2012   Allergy Allergies  Allergen Reactions  . Aspirin Nausea And Vomiting    Current Outpatient Prescriptions  Medication Sig Dispense Refill  . buPROPion (WELLBUTRIN SR) 150 MG 12 hr tablet Take 150 mg by mouth 2 (two) times daily.      Marland Kitchen diltiazem (CARDIZEM CD) 180 MG 24 hr capsule Take 1 capsule (180 mg total) by mouth daily.  30 capsule  0  . hydrocodone-acetaminophen (LORCET-HD) 5-500 MG per capsule Take 1 capsule by mouth every 6 (six) hours as needed. For pain      . pantoprazole (PROTONIX) 40 MG tablet Take 40 mg by mouth daily.       Marland Kitchen warfarin (COUMADIN) 5 MG tablet Take 2.5-5 mg by mouth daily. *Take one-half tablet (2.5mg ) by mouth on Mondays, Wednesday, and Fridays. Take one tablet (5mg ) on all other days        Discontinued Meds:   There are no discontinued medications.  Patient Active Problem List  Diagnosis  . Atrial fibrillation with RVR  . Chest pain, mid sternal  . History of pulmonary embolism  . Tobacco abuse  . Chronic anxiety  . GERD (gastroesophageal reflux disease)  . Warfarin anticoagulation  . Elevated troponin    LABS Admission on 05/11/2012, Discharged on 05/13/2012  Component Date Value  . WBC 05/11/2012 5.2   . RBC 05/11/2012 4.65   . Hemoglobin 05/11/2012 14.7   . HCT 05/11/2012 43.9   . MCV 05/11/2012 94.4   . [facility] 05/11/2012 31.6   . MCHC 05/11/2012 33.5   . RDW 05/11/2012 13.2   .  Platelets 05/11/2012 198   . Neutrophils Relative 05/11/2012 59   . Neutro Abs 05/11/2012 3.1   . Lymphocytes Relative 05/11/2012 30   . Lymphs Abs 05/11/2012 1.5   . Monocytes Relative 05/11/2012 9   . Monocytes Absolute 05/11/2012 0.5   . Eosinophils Relative 05/11/2012 1   . Eosinophils Absolute 05/11/2012 0.1   . Basophils Relative 05/11/2012 1   . Basophils Absolute 05/11/2012 0.0   . Sodium 05/11/2012 137   . Potassium 05/11/2012 3.8   . Chloride 05/11/2012 102   . CO2 05/11/2012 27   . Glucose, Bld 05/11/2012 105*  . BUN 05/11/2012 9   . Creatinine, Ser 05/11/2012 0.83   . Calcium 05/11/2012 9.1   . GFR calc non Af Amer 05/11/2012 75*  . GFR calc Af Amer 05/11/2012 86*  . Troponin I 05/11/2012 <0.30   . TSH 05/11/2012 1.202   . Troponin I 05/11/2012 <0.30   . Troponin I 05/11/2012 <0.30   . Troponin I 05/12/2012 <0.30   . MRSA by PCR 05/11/2012 NEGATIVE   . Prothrombin Time 05/11/2012 34.7*  . INR 05/11/2012 3.73*  .  Sodium 05/12/2012 141   . Potassium 05/12/2012 4.0   . Chloride 05/12/2012 103   . CO2 05/12/2012 32   . Glucose, Bld 05/12/2012 107*  . BUN 05/12/2012 7   . Creatinine, Ser 05/12/2012 1.02   . Calcium 05/12/2012 8.9   . Total Protein 05/12/2012 6.4   . Albumin 05/12/2012 3.4*  . AST 05/12/2012 18   . ALT 05/12/2012 20   . Alkaline Phosphatase 05/12/2012 76   . Total Bilirubin 05/12/2012 0.3   . GFR calc non Af Amer 05/12/2012 58*  . GFR calc Af Amer 05/12/2012 67*  . WBC 05/12/2012 6.5   . RBC 05/12/2012 4.43   . Hemoglobin 05/12/2012 13.9   . HCT 05/12/2012 42.3   . MCV 05/12/2012 95.5   . [facility] 05/12/2012 31.4   . MCHC 05/12/2012 32.9   . RDW 05/12/2012 13.3   . Platelets 05/12/2012 211   . Prothrombin Time 05/12/2012 22.1*  . INR 05/12/2012 2.03*  . Prothrombin Time 05/13/2012 19.7*  . INR 05/13/2012 1.73*     Results for this Opt Visit:     Results for orders placed during the hospital encounter of 05/11/12  CBC WITH DIFFERENTIAL        Component Value Range   WBC 5.2  4.0 - 10.5 K/uL   RBC 4.65  3.87 - 5.11 MIL/uL   Hemoglobin 14.7  12.0 - 15.0 g/dL   HCT 16.1  09.6 - 04.5 %   MCV 94.4  78.0 - 100.0 fL   MCH 31.6  26.0 - 34.0 pg   MCHC 33.5  30.0 - 36.0 g/dL   RDW 40.9  81.1 - 91.4 %   Platelets 198  150 - 400 K/uL   Neutrophils Relative 59  43 - 77 %   Neutro Abs 3.1  1.7 - 7.7 K/uL   Lymphocytes Relative 30  12 - 46 %   Lymphs Abs 1.5  0.7 - 4.0 K/uL   Monocytes Relative 9  3 - 12 %   Monocytes Absolute 0.5  0.1 - 1.0 K/uL   Eosinophils Relative 1  0 - 5 %   Eosinophils Absolute 0.1  0.0 - 0.7 K/uL   Basophils Relative 1  0 - 1 %   Basophils Absolute 0.0  0.0 - 0.1 K/uL  BASIC METABOLIC PANEL      Component Value Range   Sodium 137  135 - 145 mEq/L   Potassium 3.8  3.5 - 5.1 mEq/L   Chloride 102  96 - 112 mEq/L   CO2 27  19 - 32 mEq/L   Glucose, Bld 105 (*) 70 - 99 mg/dL   BUN 9  6 - 23 mg/dL   Creatinine, Ser 7.82  0.50 - 1.10 mg/dL   Calcium 9.1  8.4 - 95.6 mg/dL   GFR calc non Af Amer 75 (*) >90 mL/min   GFR calc Af Amer 86 (*) >90 mL/min  TROPONIN I      Component Value Range   Troponin I <0.30  <0.30 ng/mL  TSH      Component Value Range   TSH 1.202  0.350 - 4.500 uIU/mL  TROPONIN I      Component Value Range   Troponin I <0.30  <0.30 ng/mL  TROPONIN I      Component Value Range   Troponin I <0.30  <0.30 ng/mL  TROPONIN I      Component Value Range   Troponin I <0.30  <0.30 ng/mL  MRSA  PCR SCREENING      Component Value Range   MRSA by PCR NEGATIVE  NEGATIVE  PROTIME-INR      Component Value Range   Prothrombin Time 34.7 (*) 11.6 - 15.2 seconds   INR 3.73 (*) 0.00 - 1.49  COMPREHENSIVE METABOLIC PANEL      Component Value Range   Sodium 141  135 - 145 mEq/L   Potassium 4.0  3.5 - 5.1 mEq/L   Chloride 103  96 - 112 mEq/L   CO2 32  19 - 32 mEq/L   Glucose, Bld 107 (*) 70 - 99 mg/dL   BUN 7  6 - 23 mg/dL   Creatinine, Ser 4.09  0.50 - 1.10 mg/dL   Calcium 8.9  8.4 - 81.1  mg/dL   Total Protein 6.4  6.0 - 8.3 g/dL   Albumin 3.4 (*) 3.5 - 5.2 g/dL   AST 18  0 - 37 U/L   ALT 20  0 - 35 U/L   Alkaline Phosphatase 76  39 - 117 U/L   Total Bilirubin 0.3  0.3 - 1.2 mg/dL   GFR calc non Af Amer 58 (*) >90 mL/min   GFR calc Af Amer 67 (*) >90 mL/min  CBC      Component Value Range   WBC 6.5  4.0 - 10.5 K/uL   RBC 4.43  3.87 - 5.11 MIL/uL   Hemoglobin 13.9  12.0 - 15.0 g/dL   HCT 91.4  78.2 - 95.6 %   MCV 95.5  78.0 - 100.0 fL   MCH 31.4  26.0 - 34.0 pg   MCHC 32.9  30.0 - 36.0 g/dL   RDW 21.3  08.6 - 57.8 %   Platelets 211  150 - 400 K/uL  PROTIME-INR      Component Value Range   Prothrombin Time 22.1 (*) 11.6 - 15.2 seconds   INR 2.03 (*) 0.00 - 1.49  PROTIME-INR      Component Value Range   Prothrombin Time 19.7 (*) 11.6 - 15.2 seconds   INR 1.73 (*) 0.00 - 1.49    EKG Orders placed in visit on 05/23/12  . EKG 12-LEAD     Prior Assessment and Plan Problem List as of 05/23/2012          History of pulmonary embolism   Tobacco abuse   GERD (gastroesophageal reflux disease)   Warfarin anticoagulation   Last Assessment & Plan Note   01/12/2012 Office Visit Signed 01/12/2012  2:22 PM by Jodelle Gross, NP    Do to thromboembolic disease. She is followed by her primary care physician, Dr. Gerda Diss for dosage adjustments based upon PT/INR. Recommend Hemoccult stools periodically.    Atrial fibrillation with RVR   Last Assessment & Plan Note   01/12/2012 Office Visit Signed 01/12/2012  2:21 PM by Jodelle Gross, NP    Patient remains in normal sinus rhythm on Cardizem dose. She is tolerating the medication well without complaints. She has no dizziness or presyncope. Echocardiogram completed during hospitalization revealed systolic function vigorous with an estimated ejection fraction of 75-80% while in atrial fibrillation. She had grade 1 diastolic dysfunction. Left atrium was mildly dilated. RV hypertrophy was present with systolic function  normal.  Nuclear pharmacologic stress myocardial study revealed normal LV systolic function with no stress-induced EKG abnormalities normal myocardial perfusion except for the present of breast attenuation artifact no other findings were found.  This is been explained to her. Questions have been answered. We will  see her again in one year unless she becomes symptomatic. She will continue to follow with her primary care physician and we will be happy to see her sooner should he find that this is necessary.    Chest pain, mid sternal   Chronic anxiety   Elevated troponin       Imaging: Dg Chest Port 1 View  05/11/2012  *RADIOLOGY REPORT*  Clinical Data: Tachycardia, antral fibrillation  PORTABLE CHEST - 1 VIEW  Comparison: 12/28/2011  Findings: Monitor leads overlie the chest.  Stable heart size and vascularity.  Negative for CHF or pneumonia.  No effusion or pneumothorax.  Trachea is midline.  Previous right rotator cuff repair evident.  IMPRESSION: Stable exam.  No acute process.   Original Report Authenticated By: Judie Petit. Miles Costain, M.D.      [facility] Calculation: Score not calculated. Missing: Total Cholesterol, HDL

## 2012-05-23 NOTE — Assessment & Plan Note (Signed)
She has no recurrence of symptoms. She remains on diltiazem at 180 mg daily with good heart rate and BP  control. . She continues on coumadin due to history of PE. Will not stop this. Echo revealed Grade I diastolic dysfunction. Will repeat the echo in one year.

## 2012-06-17 DIAGNOSIS — I82409 Acute embolism and thrombosis of unspecified deep veins of unspecified lower extremity: Secondary | ICD-10-CM | POA: Diagnosis not present

## 2012-06-21 NOTE — Progress Notes (Signed)
UR Chart Review Completed  

## 2012-07-12 DIAGNOSIS — I4891 Unspecified atrial fibrillation: Secondary | ICD-10-CM | POA: Diagnosis not present

## 2012-07-12 DIAGNOSIS — I8310 Varicose veins of unspecified lower extremity with inflammation: Secondary | ICD-10-CM | POA: Diagnosis not present

## 2012-07-12 DIAGNOSIS — M545 Low back pain: Secondary | ICD-10-CM | POA: Diagnosis not present

## 2012-07-15 DIAGNOSIS — I82409 Acute embolism and thrombosis of unspecified deep veins of unspecified lower extremity: Secondary | ICD-10-CM | POA: Diagnosis not present

## 2012-08-03 ENCOUNTER — Encounter: Payer: Self-pay | Admitting: *Deleted

## 2012-08-03 ENCOUNTER — Other Ambulatory Visit: Payer: Self-pay | Admitting: *Deleted

## 2012-08-03 DIAGNOSIS — Z7901 Long term (current) use of anticoagulants: Secondary | ICD-10-CM | POA: Insufficient documentation

## 2012-08-06 ENCOUNTER — Other Ambulatory Visit: Payer: Self-pay | Admitting: Family Medicine

## 2012-08-15 ENCOUNTER — Ambulatory Visit (INDEPENDENT_AMBULATORY_CARE_PROVIDER_SITE_OTHER): Payer: Medicare Other | Admitting: Family Medicine

## 2012-08-15 ENCOUNTER — Encounter: Payer: Self-pay | Admitting: Family Medicine

## 2012-08-15 VITALS — BP 152/66 | HR 70 | Ht 66.0 in | Wt 269.4 lb

## 2012-08-15 DIAGNOSIS — R10817 Generalized abdominal tenderness: Secondary | ICD-10-CM

## 2012-08-15 DIAGNOSIS — R5381 Other malaise: Secondary | ICD-10-CM | POA: Diagnosis not present

## 2012-08-15 DIAGNOSIS — Z7901 Long term (current) use of anticoagulants: Secondary | ICD-10-CM

## 2012-08-15 NOTE — Progress Notes (Signed)
  Subjective:    Patient ID: Sarah [name], female    DOB: [birth year], 62 y.o.   MRN: [id number]  Gastrophageal Reflux She complains of abdominal pain and belching. This is a recurrent problem. The current episode started in the past 7 days. The problem occurs occasionally. The problem has been gradually worsening. The symptoms are aggravated by certain foods. Associated symptoms include fatigue. She has tried an antacid and a PPI for the symptoms. The treatment provided moderate relief. Past procedures do not include esophageal manometry.      Review of Systems  Constitutional: Positive for fatigue.  Gastrointestinal: Positive for abdominal pain.   Results for orders placed in visit on 08/15/12  POCT INR      Result Value Range   INR 2.8         Objective:   Physical Exam Acute distress. Vitals reviewed. HEENT normal. Lungs clear. Heart regular rate and rhythm. Abdominal exam benign.      Assessment & Plan:  Impression #1 reflux discussed. #2 chronic anticoagulation good control discussed. Plan. Maintain same dose of Coumadin. The reflux therapy discussed. WSL

## 2012-08-30 ENCOUNTER — Encounter (HOSPITAL_COMMUNITY): Payer: Self-pay | Admitting: *Deleted

## 2012-08-30 ENCOUNTER — Observation Stay (HOSPITAL_COMMUNITY)
Admission: EM | Admit: 2012-08-30 | Discharge: 2012-08-30 | Disposition: A | Discharge status: Home / Self Care | Payer: Medicare Other | Attending: Internal Medicine | Admitting: Internal Medicine

## 2012-08-30 DIAGNOSIS — I4891 Unspecified atrial fibrillation: Secondary | ICD-10-CM | POA: Diagnosis not present

## 2012-08-30 DIAGNOSIS — F419 Anxiety disorder, unspecified: Secondary | ICD-10-CM

## 2012-08-30 DIAGNOSIS — G4733 Obstructive sleep apnea (adult) (pediatric): Secondary | ICD-10-CM | POA: Diagnosis not present

## 2012-08-30 DIAGNOSIS — I480 Paroxysmal atrial fibrillation: Secondary | ICD-10-CM

## 2012-08-30 DIAGNOSIS — Z7901 Long term (current) use of anticoagulants: Secondary | ICD-10-CM | POA: Diagnosis not present

## 2012-08-30 DIAGNOSIS — Z720 Tobacco use: Secondary | ICD-10-CM

## 2012-08-30 DIAGNOSIS — I482 Chronic atrial fibrillation, unspecified: Secondary | ICD-10-CM | POA: Diagnosis present

## 2012-08-30 DIAGNOSIS — K219 Gastro-esophageal reflux disease without esophagitis: Secondary | ICD-10-CM | POA: Diagnosis present

## 2012-08-30 DIAGNOSIS — E876 Hypokalemia: Secondary | ICD-10-CM | POA: Diagnosis present

## 2012-08-30 DIAGNOSIS — E669 Obesity, unspecified: Secondary | ICD-10-CM | POA: Diagnosis present

## 2012-08-30 DIAGNOSIS — Z86711 Personal history of pulmonary embolism: Secondary | ICD-10-CM | POA: Diagnosis not present

## 2012-08-30 LAB — CBC WITH DIFFERENTIAL/PLATELET
Basophils Relative: 1 % (ref 0–1)
Eosinophils Absolute: 0.2 10*3/uL (ref 0.0–0.7)
Hemoglobin: 15.7 g/dL — ABNORMAL HIGH (ref 12.0–15.0)
MCH: 32.1 pg (ref 26.0–34.0)
MCHC: 34.6 g/dL (ref 30.0–36.0)
Monocytes Absolute: 0.7 10*3/uL (ref 0.1–1.0)
Monocytes Relative: 10 % (ref 3–12)
Neutrophils Relative %: 57 % (ref 43–77)

## 2012-08-30 LAB — COMPREHENSIVE METABOLIC PANEL
Albumin: 4.2 g/dL (ref 3.5–5.2)
BUN: 13 mg/dL (ref 6–23)
Calcium: 9.7 mg/dL (ref 8.4–10.5)
Creatinine, Ser: 1 mg/dL (ref 0.50–1.10)
Total Protein: 7.4 g/dL (ref 6.0–8.3)

## 2012-08-30 LAB — TROPONIN I: Troponin I: <0.3 ng/mL (ref <0.30)

## 2012-08-30 LAB — MRSA PCR SCREENING: MRSA by PCR: NEGATIVE

## 2012-08-30 LAB — PROTIME-INR: Prothrombin Time: 23.6 seconds — ABNORMAL HIGH (ref 11.6–15.2)

## 2012-08-30 MED ORDER — WARFARIN SODIUM 7.5 MG PO TABS
7.5000 mg | ORAL_TABLET | Freq: Every day | ORAL | Status: DC
Start: 2012-08-30 — End: 2012-08-30

## 2012-08-30 MED ORDER — DILTIAZEM LOAD VIA INFUSION
10.0000 mg | Freq: Once | INTRAVENOUS | Status: DC
  Filled 2012-08-30: qty 10

## 2012-08-30 MED ORDER — DILTIAZEM HCL ER COATED BEADS 180 MG PO CP24
180.0000 mg | ORAL_CAPSULE | Freq: Every day | ORAL | Status: DC
  Administered 2012-08-30: 180 mg via ORAL
  Filled 2012-08-30: qty 1

## 2012-08-30 MED ORDER — POTASSIUM CHLORIDE CRYS ER 20 MEQ PO TBCR
40.0000 meq | EXTENDED_RELEASE_TABLET | Freq: Three times a day (TID) | ORAL | Status: DC
  Administered 2012-08-30: 40 meq via ORAL
  Filled 2012-08-30: qty 2

## 2012-08-30 MED ORDER — BUPROPION HCL ER (SR) 150 MG PO TB12
150.0000 mg | ORAL_TABLET | Freq: Two times a day (BID) | ORAL | Status: DC
  Administered 2012-08-30: 150 mg via ORAL
  Filled 2012-08-30 (×7): qty 1

## 2012-08-30 MED ORDER — DILTIAZEM HCL 100 MG IV SOLR
5.0000 mg/h | INTRAVENOUS | Status: DC
  Administered 2012-08-30: 5 mg/h via INTRAVENOUS
  Filled 2012-08-30: qty 100

## 2012-08-30 MED ORDER — PANTOPRAZOLE SODIUM 40 MG PO TBEC
40.0000 mg | DELAYED_RELEASE_TABLET | Freq: Every day | ORAL | Status: DC
  Administered 2012-08-30: 40 mg via ORAL
  Filled 2012-08-30: qty 1

## 2012-08-30 MED ORDER — WARFARIN - PHYSICIAN DOSING INPATIENT: Freq: Every day | Status: DC

## 2012-08-30 MED ORDER — METOPROLOL TARTRATE 25 MG PO TABS: ORAL_TABLET | ORAL | Status: DC

## 2012-08-30 MED ORDER — HYDROCODONE-ACETAMINOPHEN 5-325 MG PO TABS: 1.0000 | ORAL_TABLET | Freq: Four times a day (QID) | ORAL | Status: DC | PRN

## 2012-08-30 NOTE — Progress Notes (Signed)
Discharge instructions given. Appointment made with cardiology on 09/03/12  At 1:40 pm. Vss. Hr 60's in nsr. nsl in rt arm d/c'd. D/c via w/c accompained by husband.

## 2012-08-30 NOTE — Progress Notes (Signed)
PT UP WALKING AROUND NURSES STATION X3. REMAINS IN NSR. NO SOB.

## 2012-08-30 NOTE — Consult Note (Signed)
CARDIOLOGY CONSULT NOTE   Patient ID: Sarah [name] MRN: [id number] DOB/AGE: Jan 05, 1951 62 y.o.  Admit date: 08/30/2012 Referring Physician: Crista Curb Primary Physician Harlow Asa, MD Primary Cardiologist: Bethesda Bing Reason for Consultation: Atrial fibrillation management.  HPI: Sarah [name] is a 62 year old patient admitted with recurrent atrial fibrillation with RVR, heart rate 140 beats per minute, with known history of paroxysmal atrial fibrillation, DVT, on anticoagulation therapy with Coumadin. The patient was in her usual state of health and awoke around 1 AM to use the bathroom but could not go back to sleep. Sat up for an hour, on return to bed she felt her heart racing with associated shortness of breath. She is very aware of her heart rhythm when it becomes abnormal. She therefore presented to the ER. She was placed on a Cardizem drip and converted to normal sinus rhythm within 3 hours of administration (06:57am per telemetry). Troponin was found to be negative, potassium was 3.4, magnesium 2.1. INR 2.21.  She was not found to be anemic TSH was checked in January of 2014 and found to be WNL.  The patient has had 3 admissions since August of 2013  due to recurrent atrial fibrillation with RVR. She has sleep study in October 2013 demonstrating obstructive sleep apnea. Now wears BiPAP and has been compliant.  Nuclear stress test completed in September 2013 was negative for ischemia. Most recent echocardiogram was completed in August of 2013 demonstrating a normal EF of 75-80%, grade 1 diastolic dysfunction, mild LVH, right ventricular hypertrophy was present with normal systolic function, left atrium was mildly dilated.  Review of systems complete and found to be negative unless listed above   Past Medical History  Diagnosis Date  . DVT (deep venous thrombosis) age 36  . GERD (gastroesophageal reflux disease)   . Arthritis   . Lower extremity venous stasis      LLE, chronic  . Pulmonary embolism   . Spinal stenosis   . Chronic back pain   . Warfarin anticoagulation   . Chronic anxiety     Patient describes as stress  . Tobacco abuse   . Atrial fibrillation   . Hypoestrogenism   . Headache   . Venous stasis   . Reflux   . Sleep apnea     Family History  Problem Relation Age of Onset  . Arrhythmia Mother   . Cancer Mother     colon  . Heart disease Mother   . Stroke Father     Deceased    History   Social History  . Marital Status: Married    Spouse Name: N/A    Number of Children: N/A  . Years of Education: N/A   Occupational History  . EMT     Volunteer  . Dialysis Tech     Retired   Social History Main Topics  . Smoking status: Current Every Day Smoker -- 1.00 packs/day for 40 years    Types: Cigarettes  . Smokeless tobacco: Not on file     Comment: smokes about 2 cigarettes a day now.   . Alcohol Use: No  . Drug Use: No  . Sexually Active: Not on file   Other Topics Concern  . Not on file   Social History Narrative  . No narrative on file    Past Surgical History  Procedure Laterality Date  . Ankle surgery      x2  . Cholecystectomy    . Carpal tunnel release    .  Ovarian cyst removal    . Rotator cuff repair    . Tubal ligation    . Appendectomy       Prescriptions prior to admission  Medication Sig Dispense Refill  . buPROPion (WELLBUTRIN SR) 150 MG 12 hr tablet Take 150 mg by mouth 2 (two) times daily.      . calcium carbonate (TUMS - DOSED IN MG ELEMENTAL CALCIUM) 500 MG chewable tablet Chew 1 tablet by mouth 3 (three) times daily as needed for heartburn.      . diltiazem (CARDIZEM CD) 180 MG 24 hr capsule Take 1 capsule (180 mg total) by mouth daily.  30 capsule  0  . HYDROcodone-acetaminophen (NORCO/VICODIN) 5-325 MG per tablet Take 1 tablet by mouth every 6 (six) hours as needed for pain.      . pantoprazole (PROTONIX) 40 MG tablet Take 40 mg by mouth daily.       Marland Kitchen warfarin (COUMADIN) 5 MG  tablet Take 2.5-5 mg by mouth daily. Takes 2.5 on Mon, Wed and Fri. Takes 5 mg on all other days.        Physical Exam: Blood pressure 100/55, pulse 136, temperature 98.2 F (36.8 C), temperature source Oral, resp. rate 18, height 5\' 8"  (1.727 m), weight 263 lb 0.1 oz (119.3 kg), SpO2 95.00%.   General: Well developed, well nourished, in no acute distress, obese. Head: Eyes PERRLA, No xanthomas.   Normal cephalic and atramatic  Lungs: Clear bilaterally to auscultation and percussion. Heart: HRRR S1 S2, without MRG.  Pulses are 2+ & equal.            No carotid bruit. No JVD.  Abdomen: Bowel sounds are positive, abdomen soft and non-tender.             Msk:  Back normal, normal gait. Normal strength and tone for age. Extremities: No clubbing, cyanosis or edema.  DP +1 Neuro: Alert and oriented X 3. Psych:  Good affect, responds appropriately  Labs:   Lab Results  Component Value Date   WBC 6.9 08/30/2012   HGB 15.7* 08/30/2012   HCT 45.4 08/30/2012   MCV 92.8 08/30/2012   PLT 214 08/30/2012    Recent Labs Lab 08/30/12 0332  NA 143  K 3.4*  CL 105  CO2 27  BUN 13  CREATININE 1.00  CALCIUM 9.7  PROT 7.4  BILITOT 0.3  ALKPHOS 107  ALT 25  AST 20  GLUCOSE 117*   Lab Results  Component Value Date   CKTOTAL 56 12/28/2011   CKMB 2.3 12/28/2011   TROPONINI <0.30 08/30/2012     Radiology:CXR results are pending  EKG: Atrial fibrillation with nonspecific ST-T changes in the inferolateral leads, resolved by subsequent tracing in sinus rhythm.  ASSESSMENT AND PLAN:   1. Atrial fibrillation with RVR: Now converted to normal sinus rhythm on IV Cardizem drip and has been converted to by mouth Cardizem 180 mg daily. The patient was last seen in our office in January 2014 status post discharge for atrial fibrillation with RVR at that time. She was in normal sinus rhythm at that time on Cardizem 180 mg daily. Blood pressure was 110/60 with a heart rate of 69. Therefore Cardizem dose was not  adjusted.  No evidence on nuclear study completed in September 2013 for ischemia and LVEF normal with only mild LVH by echocardiogram. At this time, would continue Cardizem and Coumadin. Patient wishes to return home, Dr. Lendell Caprice would like to discharge her after approval of  cardiology. She will need close followup as an outpatient and consideration for an antiarrhythmic.  2. Hypokalemia: Admission potassium 3.4. She has been given by mouth potassium replacement. She is not on potassium replacement at home. Review of home medications, does not reveal any potassium depleting medications. She is on a PPI, Protonix 40 mg daily which can deplete magnesium, but this was found to be normal on admission.  3. OSA: She continues on BiPAP at home and has been compliant with this.  Signed: Bettey Mare. Lyman Bishop NP [facility] 08/30/2012, 8:57 AM Co-Sign MD   Attending note:   Patient seen and examined. Modified above note by Ms. Lawrence NP. Sarah [name] has paroxysmal atrial fibrillation, 3 episodes since August 2013, low thromboembolic risk score, but on Coumadin with history of DVT and pulmonary embolus. She has had a negative ischemic workup without major evidence of structural heart disease, normal LVEF only mild LVH by echocardiogram. She has easily converted back to sinus rhythm with rate control.  No clear evidence of ACS, nonspecific ST-T wave changes while in rapid AF, normal ECG in sinus rhythm. She is hemodynamically stable, would like to go home today. Plan will be early office followup to discuss antiarrhythmic, likely consider initiating flecainide either as a standing dose or "pill in the pocket" technique. Otherwise maintain current regimen, compliance with CPAP.  Jonelle Sidle, M.D., F.A.C.C.

## 2012-08-30 NOTE — ED Notes (Signed)
Patient ambulatory to bathroom; gait steady.  Patient continues to deny chest pain.  HR increased to 144.

## 2012-08-30 NOTE — Discharge Summary (Signed)
Physician Discharge Summary  Patient ID: TYKERRIA MCCUBBINS MRN: [id number] DOB/AGE: 11/17/1950 62 y.o.  Admit date: 08/30/2012 Discharge date: 08/30/2012  Discharge Diagnoses:  Principal Problem:   Atrial fibrillation with RVR Active Problems:   History of pulmonary embolism   GERD (gastroesophageal reflux disease)   OSA (obstructive sleep apnea)   Long term (current) use of anticoagulants   Obesity, unspecified   Hypokalemia     Medication List    TAKE these medications       buPROPion 150 MG 12 hr tablet  Commonly known as:  WELLBUTRIN SR  Take 150 mg by mouth 2 (two) times daily.     calcium carbonate 500 MG chewable tablet  Commonly known as:  TUMS - dosed in mg elemental calcium  Chew 1 tablet by mouth 3 (three) times daily as needed for heartburn.     diltiazem 180 MG 24 hr capsule  Commonly known as:  CARDIZEM CD  Take 1 capsule (180 mg total) by mouth daily.     HYDROcodone-acetaminophen 5-325 MG per tablet  Commonly known as:  NORCO/VICODIN  Take 1 tablet by mouth every 6 (six) hours as needed for pain.     metoprolol tartrate 25 MG tablet  Commonly known as:  LOPRESSOR  1 tablet as needed for heart racing     pantoprazole 40 MG tablet  Commonly known as:  PROTONIX  Take 40 mg by mouth daily.     warfarin 5 MG tablet  Commonly known as:  COUMADIN  Take 2.5-5 mg by mouth daily. Takes 2.5 on Mon, Wed and Fri. Takes 5 mg on all other days.            Discharge Orders   Future Appointments Provider Department Dept Phone   09/17/2012 9:00 AM [facility] [phone]   Future Orders Complete By Expires     Activity as tolerated - No restrictions  As directed     Diet - low sodium heart healthy  As directed        Follow-up Information   Follow up with Rio Dell Bing, MD.   Contact information:   [address] [place] [address] [phone]       Disposition: 01-Home or Self Care  Discharged Condition:  Stable  Consults: Treatment Team:  Jonelle Sidle, MD  Labs:   Results for orders placed during the hospital encounter of 08/30/12 (from the past 48 hour(s))  CBC WITH DIFFERENTIAL     Status: Abnormal   Collection Time    08/30/12  3:32 AM      Result Value Range   WBC 6.9  4.0 - 10.5 K/uL   RBC 4.89  3.87 - 5.11 MIL/uL   Hemoglobin 15.7 (*) 12.0 - 15.0 g/dL   HCT 69.6  29.5 - 28.4 %   MCV 92.8  78.0 - 100.0 fL   MCH 32.1  26.0 - 34.0 pg   MCHC 34.6  30.0 - 36.0 g/dL   RDW 13.2  44.0 - 10.2 %   Platelets 214  150 - 400 K/uL   Neutrophils Relative 57  43 - 77 %   Neutro Abs 4.0  1.7 - 7.7 K/uL   Lymphocytes Relative 30  12 - 46 %   Lymphs Abs 2.1  0.7 - 4.0 K/uL   Monocytes Relative 10  3 - 12 %   Monocytes Absolute 0.7  0.1 - 1.0 K/uL   Eosinophils Relative 2  0 - 5 %  Eosinophils Absolute 0.2  0.0 - 0.7 K/uL   Basophils Relative 1  0 - 1 %   Basophils Absolute 0.1  0.0 - 0.1 K/uL  COMPREHENSIVE METABOLIC PANEL     Status: Abnormal   Collection Time    08/30/12  3:32 AM      Result Value Range   Sodium 143  135 - 145 mEq/L   Potassium 3.4 (*) 3.5 - 5.1 mEq/L   Chloride 105  96 - 112 mEq/L   CO2 27  19 - 32 mEq/L   Glucose, Bld 117 (*) 70 - 99 mg/dL   BUN 13  6 - 23 mg/dL   Creatinine, Ser 1.61  0.50 - 1.10 mg/dL   Calcium 9.7  8.4 - 09.6 mg/dL   Total Protein 7.4  6.0 - 8.3 g/dL   Albumin 4.2  3.5 - 5.2 g/dL   AST 20  0 - 37 U/L   ALT 25  0 - 35 U/L   Alkaline Phosphatase 107  39 - 117 U/L   Total Bilirubin 0.3  0.3 - 1.2 mg/dL   GFR calc non Af Amer 60 (*) >90 mL/min   GFR calc Af Amer 69 (*) >90 mL/min   Comment:            The eGFR has been calculated     using the CKD EPI equation.     This calculation has not been     validated in all clinical     situations.     eGFR's persistently     <90 mL/min signify     possible Chronic Kidney Disease.  TROPONIN I     Status: None   Collection Time    08/30/12  3:32 AM      Result Value Range   Troponin I  <0.30  <0.30 ng/mL   Comment:            Due to the release kinetics of cTnI,     a negative result within the first hours     of the onset of symptoms does not rule out     myocardial infarction with certainty.     If myocardial infarction is still suspected,     repeat the test at appropriate intervals.  PROTIME-INR     Status: Abnormal   Collection Time    08/30/12  3:32 AM      Result Value Range   Prothrombin Time 23.6 (*) 11.6 - 15.2 seconds   INR 2.21 (*) 0.00 - 1.49  MAGNESIUM     Status: None   Collection Time    08/30/12  4:32 AM      Result Value Range   Magnesium 2.1  1.5 - 2.5 mg/dL  MRSA PCR SCREENING     Status: None   Collection Time    08/30/12  6:44 AM      Result Value Range   MRSA by PCR NEGATIVE  NEGATIVE   Comment:            The GeneXpert MRSA Assay (FDA     approved for NASAL specimens     only), is one component of a     comprehensive MRSA colonization     surveillance program. It is not     intended to diagnose MRSA     infection nor to guide or     monitor treatment for     MRSA infections.    Diagnostics:  No results found.  EKG: Atrial fibrillation with rapid ventricular response and nonspecific ST and T-wave changes.  Prior   Hospital Course: See H&P for complete admission details. Ms. Emelda Fear is a 62 year old white female with several admissions over the past 6 months for palpitations and atrial fibrillation with rapid ventricular response. She was started on a Cardizem drip. She quickly converted to normal sinus rhythm. Her potassium was borderline low at 3.4 and this was repleted. Cardiology was consulted and recommended discharge and close outpatient followup. Patient will be sent home with her usual medications plus metoprolol to take as needed for palpitations. Patient has had extensive workup on previous hospitalizations. She was artery on Coumadin for history of thromboembolic disease.  Discharge Exam:  Blood pressure 124/72, pulse  136, temperature 98.2 F (36.8 C), temperature source Oral, resp. rate 20, height 5\' 8"  (1.727 m), weight 119.3 kg (263 lb 0.1 oz), SpO2 95.00%.  General: Comfortable. Lungs clear to auscultation bilaterally without wheeze rhonchi or rales Cardiovascular regular rate rhythm without murmurs gallops rub  Signed: Aum Caggiano L 08/30/2012, 12:50 PM

## 2012-08-30 NOTE — Progress Notes (Signed)
Pt converted from A-fib to NSR @ 0657. Cardizem reduced from 15mg /hr to 7.5mg /hr. A copy of the EKG strip at time of conversion is in the chart.

## 2012-08-30 NOTE — ED Notes (Signed)
Pt reporting she woke with "rapid irregular heartbeat".  Reports she has history of same, last episode in January.

## 2012-08-30 NOTE — H&P (Signed)
Hospital Admission Note Date: 08/30/2012  Patient name: Sarah [name] Medical record number: [id number] Date of birth: 1951/04/08 Age: 62 y.o. Gender: female PCP: Harlow Asa, MD  Attending physician: Christiane Ha, MD  Chief Complaint: palpitations  History of Present Illness:  Sarah [name] is an 62 y.o. female who presents for the third time in about 9 months with palpitations, atrial fibrillation with rapid ventricular response. In the past, she has converted quickly on a Cardizem drip. Patient was started on a Cardizem drip this morning. The patient was accepted by Dr. Orvan Falconer who spoke with Dr. Judd Lien. Patient is currently in the step down on Cardizem drip. She is converted to normal sinus rhythm. She had no chest pain. She has been compliant with medications. She was previously on Coumadin for history of venous thromboembolism. She has taken no new medications, prescription or over-the-counter. Her caffeine intake is minimal. She has no chest pain. She was recently diagnosed with obstructive sleep apnea and remains on BiPAP nightly. She was hoping this would help with her recurrence of atrial fibrillation. Her ejection fraction is normal. During the last hospitalization, her Cardizem was increased. She has followed up with [facility] and has been otherwise doing well.  Past Medical History  Diagnosis Date  . DVT (deep venous thrombosis) age 17  . GERD (gastroesophageal reflux disease)   . Arthritis   . Lower extremity venous stasis     LLE, chronic  . Pulmonary embolism   . Spinal stenosis   . Chronic back pain   . Warfarin anticoagulation   . Chronic anxiety     Patient describes as stress  . Tobacco abuse   . Atrial fibrillation   . Hypoestrogenism   . Headache   . Venous stasis   . Reflux   . Sleep apnea     Meds: Prescriptions prior to admission  Medication Sig Dispense Refill  . buPROPion (WELLBUTRIN SR) 150 MG 12 hr tablet Take 150 mg by mouth 2 (two)  times daily.      . Calcium Carbonate Antacid (TUMS PO) Take by mouth. Take 3-4 every day as needed      . diltiazem (CARDIZEM CD) 180 MG 24 hr capsule Take 1 capsule (180 mg total) by mouth daily.  30 capsule  0  . HYDROcodone-acetaminophen (NORCO/VICODIN) 5-325 MG per tablet Take 1 tablet by mouth every 6 (six) hours as needed for pain.      . pantoprazole (PROTONIX) 40 MG tablet Take 40 mg by mouth daily.       Marland Kitchen warfarin (COUMADIN) 5 MG tablet TAKE 1&1/2 TABLETS EVERYDAY  45 tablet  2    Allergies: Aspirin History   Social History  . Marital Status: Married    Spouse Name: N/A    Number of Children: N/A  . Years of Education: N/A   Occupational History  . EMT     Volunteer  . Dialysis Tech     Retired   Social History Main Topics  . Smoking status: Current Every Day Smoker -- 1.00 packs/day for 40 years    Types: Cigarettes  . Smokeless tobacco: Not on file     Comment: smokes about 2 cigarettes a day now.   . Alcohol Use: No  . Drug Use: No  . Sexually Active: Not on file   Other Topics Concern  . Not on file   Social History Narrative  . No narrative on file   Family History  Problem Relation Age  of Onset  . Arrhythmia Mother   . Cancer Mother     colon  . Heart disease Mother   . Stroke Father     Deceased   Past Surgical History  Procedure Laterality Date  . Ankle surgery      x2  . Cholecystectomy    . Carpal tunnel release    . Ovarian cyst removal    . Rotator cuff repair    . Tubal ligation    . Appendectomy      Review of Systems: Systems reviewed and as per HPI, otherwise negative.  Physical Exam: Blood pressure 100/55, pulse 136, temperature 98.2 F (36.8 C), temperature source Oral, resp. rate 18, height 5\' 8"  (1.727 m), weight 119.3 kg (263 lb 0.1 oz), SpO2 95.00%. BP 100/55  Pulse 136  Temp(Src) 98.2 F (36.8 C) (Oral)  Resp 18  Ht 5\' 8"  (1.727 m)  Wt 119.3 kg (263 lb 0.1 oz)  BMI 40 kg/m2  SpO2 95%  General Appearance:     Alert, cooperative, no distress, appears stated age, obese  Head:    Normocephalic, without obvious abnormality, atraumatic  Eyes:    PERRL, conjunctiva/corneas clear, EOM's intact, fundi    benign, both eyes     Nose:   Nares normal, septum midline, mucosa normal, no drainage    or sinus tenderness  Throat:   Lips, mucosa, and tongue normal; teeth and gums normal  Neck:   Supple, symmetrical, trachea midline, no adenopathy;    thyroid:  no enlargement/tenderness/nodules; no carotid   bruit or JVD  Back:     Symmetric, no curvature, ROM normal, no CVA tenderness  Lungs:     Clear to auscultation bilaterally, respirations unlabored  Chest Wall:    No tenderness or deformity   Heart:    Regular rate and rhythm, S1 and S2 normal, no murmur, rub   or gallop     Abdomen:     Soft, non-tender, bowel sounds active all four quadrants,    no masses, no organomegaly  Genitalia:   deferred   Rectal:   deferred   Extremities:   Extremities normal, atraumatic, no cyanosis or edema  Pulses:   2+ and symmetric all extremities  Skin:   Skin color, texture, turgor normal, no rashes or lesions  Lymph nodes:   Cervical, supraclavicular, and axillary nodes normal  Neurologic:   CNII-XII intact, normal strength, sensation and reflexes    throughout    Psychiatric: Normal affect  Lab results: Basic Metabolic Panel:  Recent Labs  45/40/98 0332 08/30/12 0432  NA 143  --   K 3.4*  --   CL 105  --   CO2 27  --   GLUCOSE 117*  --   BUN 13  --   CREATININE 1.00  --   CALCIUM 9.7  --   MG  --  2.1   Liver Function Tests:  Recent Labs  08/30/12 0332  AST 20  ALT 25  ALKPHOS 107  BILITOT 0.3  PROT 7.4  ALBUMIN 4.2   No results found for this basename: LIPASE, AMYLASE,  in the last 72 hours No results found for this basename: AMMONIA,  in the last 72 hours CBC:  Recent Labs  08/30/12 0332  WBC 6.9  NEUTROABS 4.0  HGB 15.7*  HCT 45.4  MCV 92.8  PLT 214   Cardiac  Enzymes:  Recent Labs  08/30/12 0332  TROPONINI <0.30    Recent Labs  08/30/12  0332  LABPROT 23.6*  INR 2.21*   Urine Drug Screen: Drugs of Abuse  No results found for this basename: labopia, cocainscrnur, labbenz, amphetmu, thcu, labbarb     EKG:  Atrial fibrillation with rapid ventricular response Nonspecific ST abnormality  Imaging results:  No results found.  Assessment & Plan: Principal Problem:   Atrial fibrillation with RVR, recurrent. Potassium slightly low. Will replete. Magnesium is normal. Will stop the Cardizem drip and resume her oral long-acting Cardizem. This is patient's third admission within the past year. Cardiology will consult and make recommendations. If stable, she can do later on today. Active Problems:   History of pulmonary embolism, Coumadin therapeutic.   GERD (gastroesophageal reflux disease)   OSA (obstructive sleep apnea)   Long term (current) use of anticoagulants   Obesity, unspecified   Hypokalemia   Krishika Bugge L 08/30/2012, 8:36 AM

## 2012-08-30 NOTE — ED Provider Notes (Signed)
History     CSN: [id number]  Arrival date & time 08/30/12  0321   First MD Initiated Contact with Patient 08/30/12 [phone]      Chief Complaint  Patient presents with  . Irregular Heart Beat    (Consider location/radiation/quality/duration/timing/severity/associated sxs/prior treatment) HPI Comments: Patient with history of paroxysmal afib.  Woke this morning with palpitations, rapid irregular heartbeat.  No chest pain or shortness of breath.  She denies caffeine intake.  She has been admitted twice for the same in the past and workup has been negative.  She has seen cardiology for this and no cause has been found.  She is on cardizem for this.  Patient is a 62 y.o. female presenting with palpitations. The history is provided by the patient.  Palpitations  This is a recurrent problem. The current episode started less than 1 hour ago. The problem occurs constantly. The problem has not changed since onset.The problem is associated with an unknown factor. On average, each episode lasts 1 hour. Associated symptoms include irregular heartbeat. Pertinent negatives include no diaphoresis, no fever and no shortness of breath. She has tried nothing for the symptoms. The treatment provided no relief. There are no known risk factors.    Past Medical History  Diagnosis Date  . DVT (deep venous thrombosis) age 73  . GERD (gastroesophageal reflux disease)   . Arthritis   . Lower extremity venous stasis     LLE, chronic  . Pulmonary embolism   . Spinal stenosis   . Chronic back pain   . Warfarin anticoagulation   . Chronic anxiety     Patient describes as stress  . Tobacco abuse   . Atrial fibrillation   . Hypoestrogenism   . Headache   . Venous stasis   . Reflux   . Sleep apnea     Past Surgical History  Procedure Laterality Date  . Ankle surgery      x2  . Cholecystectomy    . Carpal tunnel release    . Ovarian cyst removal    . Rotator cuff repair    . Tubal ligation    .  Appendectomy      Family History  Problem Relation Age of Onset  . Arrhythmia Mother   . Cancer Mother     colon  . Heart disease Mother   . Stroke Father     Deceased    History  Substance Use Topics  . Smoking status: Current Every Day Smoker -- 1.00 packs/day for 40 years    Types: Cigarettes  . Smokeless tobacco: Not on file     Comment: smokes about 2 cigarettes a day now.   . Alcohol Use: No    OB History   Grav Para Term Preterm Abortions TAB SAB Ect Mult Living                  Review of Systems  Constitutional: Negative for fever and diaphoresis.  Respiratory: Negative for shortness of breath.   Cardiovascular: Positive for palpitations.  All other systems reviewed and are negative.    Allergies  Aspirin  Home Medications   Current Outpatient Rx  Name  Route  Sig  Dispense  Refill  . buPROPion (WELLBUTRIN SR) 150 MG 12 hr tablet   Oral   Take 150 mg by mouth 2 (two) times daily.         . Calcium Carbonate Antacid (TUMS PO)   Oral   Take by mouth. Take  3-4 every day as needed         . diltiazem (CARDIZEM CD) 180 MG 24 hr capsule   Oral   Take 1 capsule (180 mg total) by mouth daily.   30 capsule   0   . HYDROcodone-acetaminophen (NORCO/VICODIN) 5-325 MG per tablet   Oral   Take 1 tablet by mouth every 6 (six) hours as needed for pain.         . pantoprazole (PROTONIX) 40 MG tablet   Oral   Take 40 mg by mouth daily.          Marland Kitchen warfarin (COUMADIN) 5 MG tablet      TAKE 1&1/2 TABLETS EVERYDAY   45 tablet   2     BP 134/43  Pulse 140  Temp(Src) 98.3 F (36.8 C) (Oral)  Resp 18  Ht 5\' 7"  (1.702 m)  Wt 260 lb (117.935 kg)  BMI 40.71 kg/m2  SpO2 96%  Physical Exam  Nursing note and vitals reviewed. Constitutional: She is oriented to person, place, and time. She appears well-developed and well-nourished. No distress.  HENT:  Head: Normocephalic and atraumatic.  Neck: Normal range of motion. Neck supple.   Cardiovascular:  No murmur heard. The heart is irregularly irregular and rapid.    Pulmonary/Chest: Effort normal and breath sounds normal. No respiratory distress. She has no wheezes.  Abdominal: Soft. Bowel sounds are normal. She exhibits no distension. There is no tenderness.  Musculoskeletal: Normal range of motion. She exhibits no edema.  Neurological: She is alert and oriented to person, place, and time.  Skin: Skin is warm and dry. She is not diaphoretic.    ED Course  Procedures (including critical care time)  Labs Reviewed  CBC WITH DIFFERENTIAL  COMPREHENSIVE METABOLIC PANEL  TROPONIN I  PROTIME-INR   No results found.   No diagnosis found.   Date: 08/30/2012  Rate: 130  Rhythm: atrial fibrillation  QRS Axis: normal  Intervals: normal  ST/T Wave abnormalities: normal  Conduction Disutrbances:none  Narrative Interpretation:   Old EKG Reviewed: unchanged    MDM  Patient presents with recurrent afib.  She was given cardizem, but has not slowed down much.  She is now on a drip at a rate of 15 but remains with rapid ventricular response.  Labs are unremarkable.  I have spoken with Dr. Orvan Falconer who agrees to admit.     CRITICAL CARE Performed by: Geoffery Lyons   Total critical care time: 30 minutes  Critical care time was exclusive of separately billable procedures and treating other patients.  Critical care was necessary to treat or prevent imminent or life-threatening deterioration.  Critical care was time spent personally by me on the following activities: development of treatment plan with patient and/or surrogate as well as nursing, discussions with consultants, evaluation of patient's response to treatment, examination of patient, obtaining history from patient or surrogate, ordering and performing treatments and interventions, ordering and review of laboratory studies, ordering and review of radiographic studies, pulse oximetry and re-evaluation of  patient's condition.         Geoffery Lyons, MD 08/30/12 [phone]

## 2012-09-03 ENCOUNTER — Ambulatory Visit (INDEPENDENT_AMBULATORY_CARE_PROVIDER_SITE_OTHER): Payer: Medicare Other | Admitting: Adult Health

## 2012-09-03 ENCOUNTER — Encounter: Payer: Self-pay | Admitting: Adult Health

## 2012-09-03 VITALS — BP 108/60 | HR 74 | Ht 68.0 in | Wt 264.0 lb

## 2012-09-03 DIAGNOSIS — I10 Essential (primary) hypertension: Secondary | ICD-10-CM

## 2012-09-03 DIAGNOSIS — I4891 Unspecified atrial fibrillation: Secondary | ICD-10-CM | POA: Diagnosis not present

## 2012-09-03 DIAGNOSIS — F17200 Nicotine dependence, unspecified, uncomplicated: Secondary | ICD-10-CM | POA: Diagnosis not present

## 2012-09-03 DIAGNOSIS — Z720 Tobacco use: Secondary | ICD-10-CM

## 2012-09-03 MED ORDER — FLECAINIDE ACETATE 50 MG PO TABS: 50.0000 mg | ORAL_TABLET | Freq: Two times a day (BID) | ORAL | Status: DC

## 2012-09-03 NOTE — Assessment & Plan Note (Signed)
She remains in normal sinus rhythm. After discussed with Dr. Diona Browner while she was in the hospital  it was decided then to place her on flecainide 50 mg by mouth twice a day. I will see her again in a couple of weeks with an EKG. Consider stress test on followup although with low-dose gout need to do so per Dr. Diona Browner.

## 2012-09-03 NOTE — Patient Instructions (Addendum)
Your physician has recommended you make the following change in your medication: START FLECAINIDE ( 50 MG ) TWICE A DAY  Your physician recommends that you schedule a follow-up appointment in: 2 WEEKS WITH KATHRYN LAWRENCE,NP WITH A FOLLOW UP EKG AND DISCUSS STRESS TEST

## 2012-09-03 NOTE — Assessment & Plan Note (Signed)
She has stopped smoking. Has no plans to restart. I have encouraged her in this endeavor.

## 2012-09-03 NOTE — Progress Notes (Signed)
HPI Sarah [name] is an [age] year old patient of Dr. Dietrich Pates we are seeing for ongoing assessment treatment of PAF with RVR in the setting of pneumonia. She was last seen in the office in January 2014. Chest remains on Coumadin with a history of thromboembolic disease followed by Dr.Luking. She has recently been diagnosed with obstructive sleep apnea and is on CPAP.   She has not had any further palpitations. She is very well aware of any irregular rhythm.   Allergies  Allergen Reactions  . Aspirin Nausea And Vomiting    Current Outpatient Prescriptions  Medication Sig Dispense Refill  . buPROPion (WELLBUTRIN SR) 150 MG 12 hr tablet Take 150 mg by mouth 2 (two) times daily.      . calcium carbonate (TUMS - DOSED IN MG ELEMENTAL CALCIUM) 500 MG chewable tablet Chew 1 tablet by mouth 3 (three) times daily as needed for heartburn.      . diltiazem (CARDIZEM CD) 180 MG 24 hr capsule Take 1 capsule (180 mg total) by mouth daily.  30 capsule  0  . HYDROcodone-acetaminophen (NORCO/VICODIN) 5-325 MG per tablet Take 1 tablet by mouth every 6 (six) hours as needed for pain.      . metoprolol (LOPRESSOR) 25 MG tablet 1 tablet as needed for heart racing  10 tablet  0  . pantoprazole (PROTONIX) 40 MG tablet Take 40 mg by mouth daily.       Marland Kitchen warfarin (COUMADIN) 5 MG tablet Take 2.5-5 mg by mouth daily. Takes 2.5 on Mon, Wed and Fri. Takes 5 mg on all other days.       No current facility-administered medications for this visit.    Past Medical History  Diagnosis Date  . DVT (deep venous thrombosis) age 62  . GERD (gastroesophageal reflux disease)   . Arthritis   . Lower extremity venous stasis     LLE, chronic  . Pulmonary embolism   . Spinal stenosis   . Chronic back pain   . Warfarin anticoagulation   . Chronic anxiety     Patient describes as stress  . Tobacco abuse   . Atrial fibrillation   . Hypoestrogenism   . Headache   . Venous stasis   . Reflux   . Sleep apnea     Past  Surgical History  Procedure Laterality Date  . Ankle surgery      x2  . Cholecystectomy    . Carpal tunnel release    . Ovarian cyst removal    . Rotator cuff repair    . Tubal ligation    . Appendectomy      QMV:HQIONG of systems complete and found to be negative unless listed above  PHYSICAL EXAM BP 108/60  Pulse 74  Ht 5\' 8"  (1.727 m)  Wt 264 lb (119.75 kg)  BMI 40.15 kg/m2 General: Well developed, well nourished, in no acute distress, obese.  Head: Eyes PERRLA, No xanthomas.   Normal cephalic and atramatic  Lungs: Clear bilaterally to auscultation and percussion. Heart: HRRR S1 S2, without MRG.  Pulses are 2+ & equal.            No carotid bruit. No JVD.  No abdominal bruits. No femoral bruits. Abdomen: Bowel sounds are positive, abdomen soft and non-tender without masses or                  Hernia's noted. Msk:  Back normal, normal gait. Normal strength and tone for age. Extremities: No clubbing, cyanosis  or edema.  DP +1 Neuro: Alert and oriented X 3. Psych:  Good affect, responds appropriately  EKG:NSR  ASSESSMENT AND PLAN

## 2012-09-09 ENCOUNTER — Other Ambulatory Visit: Payer: Self-pay | Admitting: Family Medicine

## 2012-09-17 ENCOUNTER — Ambulatory Visit (INDEPENDENT_AMBULATORY_CARE_PROVIDER_SITE_OTHER): Payer: Medicare Other | Admitting: *Deleted

## 2012-09-17 ENCOUNTER — Encounter: Payer: Self-pay | Admitting: Adult Health

## 2012-09-17 ENCOUNTER — Ambulatory Visit (INDEPENDENT_AMBULATORY_CARE_PROVIDER_SITE_OTHER): Payer: Medicare Other | Admitting: Adult Health

## 2012-09-17 VITALS — BP 118/50 | HR 74 | Ht 67.0 in | Wt 264.0 lb

## 2012-09-17 DIAGNOSIS — R079 Chest pain, unspecified: Secondary | ICD-10-CM

## 2012-09-17 DIAGNOSIS — Z7901 Long term (current) use of anticoagulants: Secondary | ICD-10-CM

## 2012-09-17 DIAGNOSIS — I4891 Unspecified atrial fibrillation: Secondary | ICD-10-CM | POA: Diagnosis not present

## 2012-09-17 NOTE — Patient Instructions (Addendum)
The current medical regimen is effective;  continue present plan and medications.  Follow up in 6 months with Dr Joni Reining.  You will receive a letter in the mail 2 months before you are due.  Please call us when you receive this letter to schedule your follow up appointment.

## 2012-09-17 NOTE — Assessment & Plan Note (Signed)
Continue treatment regimen ?

## 2012-09-17 NOTE — Progress Notes (Signed)
HPI [name] is a 62 year old patient we are following for ongoing assessment and treatment of atrial fibrillation. She was last seen in the office 09/03/2012 after being placed on flecainide during recent hospitalization for atrial fibrillation with RVR. She comes today for ongoing evaluation and discussion and need for stress test with new medication.   She is tolerating the medication without recurrence of tachyarrhthmia's. No chest pain or dyspnea. She remains active with her grandchildren.   Allergies  Allergen Reactions  . Aspirin Nausea And Vomiting    Current Outpatient Prescriptions  Medication Sig Dispense Refill  . buPROPion (WELLBUTRIN SR) 150 MG 12 hr tablet TAKE 1 TABLET TWICE DAILY  60 tablet  6  . calcium carbonate (TUMS - DOSED IN MG ELEMENTAL CALCIUM) 500 MG chewable tablet Chew 1 tablet by mouth 3 (three) times daily as needed for heartburn.      . diltiazem (CARDIZEM CD) 180 MG 24 hr capsule Take 1 capsule (180 mg total) by mouth daily.  30 capsule  0  . flecainide (TAMBOCOR) 50 MG tablet Take 1 tablet (50 mg total) by mouth 2 (two) times daily.  60 tablet  3  . HYDROcodone-acetaminophen (NORCO/VICODIN) 5-325 MG per tablet Take 1 tablet by mouth every 6 (six) hours as needed for pain.      . pantoprazole (PROTONIX) 40 MG tablet TAKE 1 TABLET EVERY MORNING  30 tablet  2  . warfarin (COUMADIN) 5 MG tablet Take 2.5-5 mg by mouth daily. Takes 2.5 on Mon, Wed and Fri. Takes 5 mg on all other days.      . metoprolol (LOPRESSOR) 25 MG tablet 1 tablet as needed for heart racing  10 tablet  0   No current facility-administered medications for this visit.    Past Medical History  Diagnosis Date  . DVT (deep venous thrombosis) age 80  . GERD (gastroesophageal reflux disease)   . Arthritis   . Lower extremity venous stasis     LLE, chronic  . Pulmonary embolism   . Spinal stenosis   . Chronic back pain   . Warfarin anticoagulation   . Chronic anxiety     Patient  describes as stress  . Tobacco abuse   . Atrial fibrillation   . Hypoestrogenism   . Headache   . Venous stasis   . Reflux   . Sleep apnea     Past Surgical History  Procedure Laterality Date  . Ankle surgery      x2  . Cholecystectomy    . Carpal tunnel release    . Ovarian cyst removal    . Rotator cuff repair    . Tubal ligation    . Appendectomy      ZOX:WRUEAV of systems complete and found to be negative unless listed above  PHYSICAL EXAM BP 118/50  Pulse 74  Ht 5\' 7"  (1.702 m)  Wt 264 lb (119.75 kg)  BMI 41.34 kg/m2 General: Well developed, well nourished, in no acute distress Head: Eyes PERRLA, No xanthomas.   Normal cephalic and atramatic  Lungs: Clear bilaterally to auscultation and percussion. Heart: HRRR S1 S2, without MRG.  Pulses are 2+ & equal.            No carotid bruit. No JVD.  No abdominal bruits. No femoral bruits. Abdomen: Bowel sounds are positive, abdomen soft and non-tender without masses or                  Hernia's noted. Msk:  Back normal, normal gait. Normal strength and tone for age. Extremities: No clubbing, cyanosis or edema.  DP +1 Neuro: Alert and oriented X 3. Psych:  Good affect, responds appropriately  EKG:NSR  ASSESSMENT AND PLAN

## 2012-09-17 NOTE — Assessment & Plan Note (Signed)
She has had no recurrence of these symptoms. Will continue flecainide. She refuses stress test as she cannot walk on treadmill due to bad back. Due to low dose, will continue to monitor her response. She will be seen in 6 months unless symptomatic.

## 2012-09-28 ENCOUNTER — Other Ambulatory Visit: Payer: Self-pay | Admitting: *Deleted

## 2012-09-28 DIAGNOSIS — Z7901 Long term (current) use of anticoagulants: Secondary | ICD-10-CM

## 2012-09-30 DIAGNOSIS — M48061 Spinal stenosis, lumbar region without neurogenic claudication: Secondary | ICD-10-CM | POA: Diagnosis not present

## 2012-09-30 DIAGNOSIS — Z79899 Other long term (current) drug therapy: Secondary | ICD-10-CM | POA: Diagnosis not present

## 2012-09-30 DIAGNOSIS — G894 Chronic pain syndrome: Secondary | ICD-10-CM | POA: Diagnosis not present

## 2012-10-02 ENCOUNTER — Ambulatory Visit (INDEPENDENT_AMBULATORY_CARE_PROVIDER_SITE_OTHER): Payer: Medicare Other | Admitting: Family Medicine

## 2012-10-02 ENCOUNTER — Encounter: Payer: Self-pay | Admitting: Family Medicine

## 2012-10-02 VITALS — BP 138/79 | HR 68 | Wt 267.2 lb

## 2012-10-02 DIAGNOSIS — Z86711 Personal history of pulmonary embolism: Secondary | ICD-10-CM

## 2012-10-02 DIAGNOSIS — F419 Anxiety disorder, unspecified: Secondary | ICD-10-CM

## 2012-10-02 DIAGNOSIS — Z7901 Long term (current) use of anticoagulants: Secondary | ICD-10-CM | POA: Diagnosis not present

## 2012-10-02 DIAGNOSIS — G4733 Obstructive sleep apnea (adult) (pediatric): Secondary | ICD-10-CM | POA: Diagnosis not present

## 2012-10-02 DIAGNOSIS — F411 Generalized anxiety disorder: Secondary | ICD-10-CM | POA: Diagnosis not present

## 2012-10-07 NOTE — Progress Notes (Signed)
  Subjective:    Patient ID: Sarah [name], female    DOB: [birth year], 62 y.o.   MRN: [id number]  HPI Discussion. She she he having therapeutic steroid injections 2 to chronic neuropathic back pain. In order to do this, her specialist has says she has to stop the Coumadin. Patient has a history of DVT x2 in the past. Unfortunately she still smokes.  Patient is on chronic anti-coagulation. She maintains her INR well. Review of Systems No chest pain no abdominal pain no shortness of breath no leg pain.    Objective:   Physical Exam Alert no acute distress. HEENT normal. Lungs clear. Heart regular rate and rhythm. Ankles without edema.       Assessment & Plan:  Impression coagulase and status. Very long discussion held. Do to history of DVT x2 in the past. Along with the fact that specialist recommended lifelong therapy, we will recommend bridge therapy. Multiple questions answered. 25 minutes spent with patient most in discussion. Plan Will authorize tests. Will need bridge therapy. This involves stopping the Coumadin 5 days before the test. At that point we will initiate therapeutic low molecular weight heparin. This will be maintained until 24 hours before the injection. Low molecular weight heparin will be resumed 24 hours after the injection and Coumadin will be initiated with appropriate bridge therapy. WSL

## 2012-10-08 ENCOUNTER — Other Ambulatory Visit: Payer: Self-pay | Admitting: *Deleted

## 2012-10-08 ENCOUNTER — Telehealth: Payer: Self-pay | Admitting: Family Medicine

## 2012-10-08 DIAGNOSIS — I4891 Unspecified atrial fibrillation: Secondary | ICD-10-CM

## 2012-10-08 DIAGNOSIS — I10 Essential (primary) hypertension: Secondary | ICD-10-CM

## 2012-10-08 MED ORDER — FLECAINIDE ACETATE 50 MG PO TABS: 50.0000 mg | ORAL_TABLET | Freq: Two times a day (BID) | ORAL | Status: DC

## 2012-10-08 NOTE — Telephone Encounter (Signed)
Patient wants to know when you want her to start Lovanox. Dr Eduard Clos is going to do the injections for her on June 17,2014 for her back.

## 2012-10-09 ENCOUNTER — Telehealth: Payer: Self-pay | Admitting: *Deleted

## 2012-10-09 DIAGNOSIS — I10 Essential (primary) hypertension: Secondary | ICD-10-CM

## 2012-10-09 DIAGNOSIS — I4891 Unspecified atrial fibrillation: Secondary | ICD-10-CM

## 2012-10-09 MED ORDER — PANTOPRAZOLE SODIUM 40 MG PO TBEC: 40.0000 mg | DELAYED_RELEASE_TABLET | Freq: Every day | ORAL | Status: DC

## 2012-10-09 MED ORDER — FLECAINIDE ACETATE 50 MG PO TABS: 50.0000 mg | ORAL_TABLET | Freq: Two times a day (BID) | ORAL | Status: DC

## 2012-10-09 MED ORDER — WARFARIN SODIUM 5 MG PO TABS: ORAL_TABLET | ORAL | Status: DC

## 2012-10-09 MED ORDER — DILTIAZEM HCL ER COATED BEADS 180 MG PO CP24: 180.0000 mg | ORAL_CAPSULE | Freq: Every day | ORAL | Status: DC

## 2012-10-09 MED ORDER — BUPROPION HCL ER (SR) 150 MG PO TB12: 150.0000 mg | ORAL_TABLET | Freq: Two times a day (BID) | ORAL | Status: DC

## 2012-10-09 NOTE — Telephone Encounter (Signed)
Medication sent via escribe.  

## 2012-10-09 NOTE — Telephone Encounter (Signed)
Discussed with patient and lovenox called into [facility].

## 2012-10-09 NOTE — Telephone Encounter (Signed)
Start lovenox 120 mg q 12 hrs tomorrow a m. stop taking coumadin tomorrow. The day before surgery, take lovenox that morning then stop. The day following surgery, resume twice per day loveno and daily coumadin and come see [place] around 2 days after the surgery.    Rx: Lovenox 120 mg #20 doses

## 2012-10-17 DIAGNOSIS — M48062 Spinal stenosis, lumbar region with neurogenic claudication: Secondary | ICD-10-CM | POA: Diagnosis not present

## 2012-10-17 DIAGNOSIS — G894 Chronic pain syndrome: Secondary | ICD-10-CM | POA: Diagnosis not present

## 2012-10-17 DIAGNOSIS — IMO0002 Reserved for concepts with insufficient information to code with codable children: Secondary | ICD-10-CM | POA: Diagnosis not present

## 2012-10-17 DIAGNOSIS — M545 Low back pain: Secondary | ICD-10-CM | POA: Diagnosis not present

## 2012-10-18 ENCOUNTER — Ambulatory Visit: Payer: Medicare Other

## 2012-10-18 DIAGNOSIS — Z7901 Long term (current) use of anticoagulants: Secondary | ICD-10-CM | POA: Diagnosis not present

## 2012-10-19 LAB — PROTIME-INR: INR: 1.06 (ref <1.50)

## 2012-10-23 ENCOUNTER — Telehealth: Payer: Self-pay | Admitting: *Deleted

## 2012-10-23 ENCOUNTER — Other Ambulatory Visit: Payer: Self-pay | Admitting: *Deleted

## 2012-10-23 DIAGNOSIS — Z7901 Long term (current) use of anticoagulants: Secondary | ICD-10-CM

## 2012-10-23 NOTE — Telephone Encounter (Signed)
Called home and mobile numbers, neither phone number available. Left message on husbands cell, to have pt call office.

## 2012-10-25 DIAGNOSIS — Z7901 Long term (current) use of anticoagulants: Secondary | ICD-10-CM | POA: Diagnosis not present

## 2012-10-26 LAB — PROTIME-INR: INR: 1.42 (ref <1.50)

## 2012-11-08 ENCOUNTER — Other Ambulatory Visit: Payer: Self-pay | Admitting: Family Medicine

## 2012-11-08 DIAGNOSIS — Z7901 Long term (current) use of anticoagulants: Secondary | ICD-10-CM | POA: Diagnosis not present

## 2012-11-09 LAB — PROTIME-INR: Prothrombin Time: 22.5 seconds — ABNORMAL HIGH (ref 11.6–15.2)

## 2012-11-21 ENCOUNTER — Telehealth: Payer: Self-pay | Admitting: Family Medicine

## 2012-11-21 NOTE — Telephone Encounter (Signed)
Please call patient regarding her medication.  She has an appointment for injection soon.  States it is complicated.  Thanks. :)

## 2012-11-21 NOTE — Telephone Encounter (Signed)
Pt is going to have another injection in her back. She has not scheduled yet. She wants to know what dose and schedule of lovenox she should start on before procedure

## 2012-11-21 NOTE — Telephone Encounter (Signed)
Steve to see 

## 2012-11-22 NOTE — Telephone Encounter (Signed)
Calton Golds, nurse to research exactly how we did this last time, do same regimen--either in ov notes or telephone notes

## 2012-11-23 NOTE — Telephone Encounter (Signed)
No record of this in office or telephone notes. Please see INR log in chart (page is folded). Please advise.

## 2012-11-25 NOTE — Telephone Encounter (Signed)
Lovenox 80mg  as directed last filled 7/12

## 2012-11-25 NOTE — Telephone Encounter (Signed)
Call [facility] and see what the last dose of twice daily lovenox was.  Refill this.  Look at pt instruction sheet from our last office visit together to see how we recommended it.

## 2012-12-19 ENCOUNTER — Ambulatory Visit (INDEPENDENT_AMBULATORY_CARE_PROVIDER_SITE_OTHER): Payer: Medicare Other | Admitting: Family Medicine

## 2012-12-19 ENCOUNTER — Encounter: Payer: Self-pay | Admitting: Family Medicine

## 2012-12-19 VITALS — BP 122/78 | Ht 67.0 in | Wt 269.0 lb

## 2012-12-19 DIAGNOSIS — K219 Gastro-esophageal reflux disease without esophagitis: Secondary | ICD-10-CM

## 2012-12-19 DIAGNOSIS — I4891 Unspecified atrial fibrillation: Secondary | ICD-10-CM | POA: Diagnosis not present

## 2012-12-19 DIAGNOSIS — G4733 Obstructive sleep apnea (adult) (pediatric): Secondary | ICD-10-CM | POA: Diagnosis not present

## 2012-12-19 DIAGNOSIS — Z7901 Long term (current) use of anticoagulants: Secondary | ICD-10-CM

## 2012-12-19 DIAGNOSIS — Z86711 Personal history of pulmonary embolism: Secondary | ICD-10-CM

## 2012-12-19 LAB — POCT INR: INR: 3

## 2012-12-19 MED ORDER — HYDROCODONE-ACETAMINOPHEN 5-325 MG PO TABS: 1.5000 | ORAL_TABLET | Freq: Every day | ORAL | Status: DC

## 2012-12-19 NOTE — Progress Notes (Signed)
  Subjective:    Patient ID: Sarah [name], female    DOB: Aug 19, 1950, 62 y.o.   MRN: [id number]  HPIHere for a check up. Pt has no concerns. INR today 3.0. She takes 5mg  everyday except 1/2 tab on Monday and Wednesday.   Not exercising much at all  Watching diet fairly well,  wellbutrin no helping a lot.  Results for orders placed in visit on 12/19/12  POCT INR      Result Value Range   INR 3.0      Review of Systems No chest pain no back pain no abdominal pain. Chronic pain from arthritis. Uses hydrocodone when necessary. Notices no rapid heart rate.    Objective:   Physical Exam  Alert no acute distress. Lungs clear. Heart irregular but controlled rhythm. HEENT normal. Significant crepitations. Ankles without significant edema.      Assessment & Plan:  Impression arthritis severe and ongoing. #2 atrial fib good control. #3 chronic anticoagulation INR in good rate. #4 history reflux. Plan hydrocodone refilled. Maintain same dose Coumadin. Diet exercise discussed. Patient may need further procedures with further bridge therapy discussed. 25 minutes spent most in discussion. WSL

## 2012-12-19 NOTE — Patient Instructions (Addendum)
Five d before surg/injec, stop coumadin  That day, start twice per day injec of 120 mg lovenox.   24 hr before injec, stop lovenox.  24 hrs after inject, resume lovenox and coumadin  'ck inr three or four d after starting coum

## 2013-01-14 ENCOUNTER — Ambulatory Visit (INDEPENDENT_AMBULATORY_CARE_PROVIDER_SITE_OTHER): Payer: Medicare Other | Admitting: *Deleted

## 2013-01-14 DIAGNOSIS — Z7901 Long term (current) use of anticoagulants: Secondary | ICD-10-CM | POA: Diagnosis not present

## 2013-01-14 LAB — POCT INR: INR: 1

## 2013-01-15 DIAGNOSIS — M545 Low back pain: Secondary | ICD-10-CM | POA: Diagnosis not present

## 2013-01-15 DIAGNOSIS — M48062 Spinal stenosis, lumbar region with neurogenic claudication: Secondary | ICD-10-CM | POA: Diagnosis not present

## 2013-01-15 DIAGNOSIS — G894 Chronic pain syndrome: Secondary | ICD-10-CM | POA: Diagnosis not present

## 2013-01-15 DIAGNOSIS — IMO0002 Reserved for concepts with insufficient information to code with codable children: Secondary | ICD-10-CM | POA: Diagnosis not present

## 2013-01-21 ENCOUNTER — Ambulatory Visit (INDEPENDENT_AMBULATORY_CARE_PROVIDER_SITE_OTHER): Payer: Medicare Other | Admitting: *Deleted

## 2013-01-21 DIAGNOSIS — Z7901 Long term (current) use of anticoagulants: Secondary | ICD-10-CM | POA: Diagnosis not present

## 2013-01-31 ENCOUNTER — Other Ambulatory Visit: Payer: Self-pay | Admitting: Family Medicine

## 2013-02-04 ENCOUNTER — Ambulatory Visit (INDEPENDENT_AMBULATORY_CARE_PROVIDER_SITE_OTHER): Payer: Medicare Other

## 2013-02-04 DIAGNOSIS — Z7901 Long term (current) use of anticoagulants: Secondary | ICD-10-CM

## 2013-02-04 NOTE — Patient Instructions (Signed)
Continue same treatment, recheck in one month

## 2013-02-10 ENCOUNTER — Other Ambulatory Visit: Payer: Self-pay | Admitting: Family Medicine

## 2013-03-06 ENCOUNTER — Other Ambulatory Visit: Payer: Self-pay

## 2013-03-07 ENCOUNTER — Ambulatory Visit (INDEPENDENT_AMBULATORY_CARE_PROVIDER_SITE_OTHER): Payer: Medicare Other | Admitting: *Deleted

## 2013-03-07 DIAGNOSIS — Z23 Encounter for immunization: Secondary | ICD-10-CM | POA: Diagnosis not present

## 2013-03-07 DIAGNOSIS — Z7901 Long term (current) use of anticoagulants: Secondary | ICD-10-CM

## 2013-03-11 ENCOUNTER — Ambulatory Visit (INDEPENDENT_AMBULATORY_CARE_PROVIDER_SITE_OTHER): Payer: Medicare Other | Admitting: Adult Health

## 2013-03-11 ENCOUNTER — Encounter: Payer: Self-pay | Admitting: Adult Health

## 2013-03-11 VITALS — BP 122/58 | HR 83 | Ht 67.0 in | Wt 262.0 lb

## 2013-03-11 DIAGNOSIS — F17200 Nicotine dependence, unspecified, uncomplicated: Secondary | ICD-10-CM

## 2013-03-11 DIAGNOSIS — R079 Chest pain, unspecified: Secondary | ICD-10-CM

## 2013-03-11 DIAGNOSIS — Z86711 Personal history of pulmonary embolism: Secondary | ICD-10-CM | POA: Diagnosis not present

## 2013-03-11 DIAGNOSIS — I4891 Unspecified atrial fibrillation: Secondary | ICD-10-CM | POA: Diagnosis not present

## 2013-03-11 DIAGNOSIS — Z720 Tobacco use: Secondary | ICD-10-CM

## 2013-03-11 NOTE — Progress Notes (Signed)
Name: Sarah [name]    DOB: 21-Oct-1950  Age: 62 y.o.  MR#: [id number]       PCP:  Harlow Asa, MD      Insurance: Payor: MEDICARE / Plan: MEDICARE PART A AND B / Product Type: *No Product type* /   CC:    Chief Complaint  Patient presents with  . Atrial Fibrillation    VS Filed Vitals:   03/11/13 1413  BP: 122/58  Pulse: 83  Height: 5\' 7"  (1.702 m)  Weight: 262 lb (118.842 kg)    Weights Current Weight  03/11/13 262 lb (118.842 kg)  12/19/12 269 lb (122.018 kg)  10/02/12 267 lb 3.2 oz (121.201 kg)    Blood Pressure  BP Readings from Last 3 Encounters:  03/11/13 122/58  12/19/12 122/78  10/02/12 138/79     Admit date:  (Not on file) Last encounter with RMR:  09/17/2012   Allergy Aspirin  Current Outpatient Prescriptions  Medication Sig Dispense Refill  . buPROPion (WELLBUTRIN SR) 150 MG 12 hr tablet TAKE 1 TABLET TWICE DAILY  180 tablet  1  . calcium carbonate (TUMS - DOSED IN MG ELEMENTAL CALCIUM) 500 MG chewable tablet Chew 1 tablet by mouth 3 (three) times daily as needed for heartburn.      . diclofenac (VOLTAREN) 75 MG EC tablet       . Diclofenac (ZORVOLEX) 35 MG CAPS Take by mouth.      . diltiazem (CARDIZEM CD) 180 MG 24 hr capsule Take 1 capsule (180 mg total) by mouth daily.  90 capsule  0  . flecainide (TAMBOCOR) 50 MG tablet Take 1 tablet (50 mg total) by mouth 2 (two) times daily.  60 tablet  6  . HYDROcodone-acetaminophen (NORCO/VICODIN) 5-325 MG per tablet Take 1.5 tablets by mouth at bedtime.  45 tablet  5  . metoprolol (LOPRESSOR) 25 MG tablet 1 tablet as needed for heart racing  10 tablet  0  . pantoprazole (PROTONIX) 40 MG tablet TAKE 1 TABLET EVERY MORNING  90 tablet  1  . Topiramate ER (TROKENDI XR) 50 MG CP24 Take by mouth.      . warfarin (COUMADIN) 5 MG tablet TAKE ONE EVERY DAY AS DIRECTED  90 tablet  1   No current facility-administered medications for this visit.    Discontinued Meds:   There are no discontinued medications.  Patient  Active Problem List   Diagnosis Date Noted  . Atrial fibrillation with RVR 08/30/2012  . Obesity, unspecified 08/30/2012  . Hypokalemia 08/30/2012  . Long term (current) use of anticoagulants 08/03/2012  . OSA (obstructive sleep apnea) 05/23/2012  . History of pulmonary embolism 12/28/2011  . Tobacco abuse 12/28/2011  . Chronic anxiety 12/28/2011  . GERD (gastroesophageal reflux disease) 12/28/2011  . Warfarin anticoagulation 12/28/2011    LABS    Component Value Date/Time   NA 143 08/30/2012 0332   NA 141 05/12/2012 0355   NA 137 05/11/2012 1138   K 3.4* 08/30/2012 0332   K 4.0 05/12/2012 0355   K 3.8 05/11/2012 1138   CL 105 08/30/2012 0332   CL 103 05/12/2012 0355   CL 102 05/11/2012 1138   CO2 27 08/30/2012 0332   CO2 32 05/12/2012 0355   CO2 27 05/11/2012 1138   GLUCOSE 117* 08/30/2012 0332   GLUCOSE 107* 05/12/2012 0355   GLUCOSE 105* 05/11/2012 1138   BUN 13 08/30/2012 0332   BUN 7 05/12/2012 0355   BUN 9 05/11/2012 1138   CREATININE  1.00 08/30/2012 0332   CREATININE 1.02 05/12/2012 0355   CREATININE 0.83 05/11/2012 1138   CALCIUM 9.7 08/30/2012 0332   CALCIUM 8.9 05/12/2012 0355   CALCIUM 9.1 05/11/2012 1138   GFRNONAA 60* 08/30/2012 0332   GFRNONAA 58* 05/12/2012 0355   GFRNONAA 75* 05/11/2012 1138   GFRAA 69* 08/30/2012 0332   GFRAA 67* 05/12/2012 0355   GFRAA 86* 05/11/2012 1138   CMP     Component Value Date/Time   NA 143 08/30/2012 0332   K 3.4* 08/30/2012 0332   CL 105 08/30/2012 0332   CO2 27 08/30/2012 0332   GLUCOSE 117* 08/30/2012 0332   BUN 13 08/30/2012 0332   CREATININE 1.00 08/30/2012 0332   CALCIUM 9.7 08/30/2012 0332   PROT 7.4 08/30/2012 0332   ALBUMIN 4.2 08/30/2012 0332   AST 20 08/30/2012 0332   ALT 25 08/30/2012 0332   ALKPHOS 107 08/30/2012 0332   BILITOT 0.3 08/30/2012 0332   GFRNONAA 60* 08/30/2012 0332   GFRAA 69* 08/30/2012 0332       Component Value Date/Time   WBC 6.9 08/30/2012 0332   WBC 6.5 05/12/2012 0355   WBC 5.2 05/11/2012 1138   HGB 15.7* 08/30/2012 0332   HGB 13.9 05/12/2012  0355   HGB 14.7 05/11/2012 1138   HCT 45.4 08/30/2012 0332   HCT 42.3 05/12/2012 0355   HCT 43.9 05/11/2012 1138   MCV 92.8 08/30/2012 0332   MCV 95.5 05/12/2012 0355   MCV 94.4 05/11/2012 1138    Lipid Panel  No results found for this basename: chol, trig, hdl, cholhdl, vldl, ldlcalc    ABG No results found for this basename: phart, pco2, pco2art, po2, po2art, hco3, tco2, acidbasedef, o2sat     Lab Results  Component Value Date   TSH 1.202 05/11/2012   BNP (last 3 results) No results found for this basename: PROBNP,  in the last 8760 hours Cardiac Panel (last 3 results) No results found for this basename: CKTOTAL, CKMB, TROPONINI, RELINDX,  in the last 72 hours  Iron/TIBC/Ferritin No results found for this basename: iron, tibc, ferritin     EKG Orders placed in visit on 09/17/12  . EKG 12-LEAD     Prior Assessment and Plan Problem List as of 03/11/2013   History of pulmonary embolism   Tobacco abuse   Last Assessment & Plan   09/03/2012 Office Visit Written 09/03/2012  2:29 PM by Jodelle Gross, NP     She has stopped smoking. Has no plans to restart. I have encouraged her in this endeavor.    GERD (gastroesophageal reflux disease)   Warfarin anticoagulation   Last Assessment & Plan   09/17/2012 Office Visit Written 09/17/2012 11:44 AM by Jodelle Gross, NP     Continue treatment regimen.    Chronic anxiety   OSA (obstructive sleep apnea)   Last Assessment & Plan   05/23/2012 Office Visit Written 05/23/2012  3:16 PM by Jodelle Gross, NP     Recently diagnosed by Dr. Gerda Diss. She is to have CPAP fitted. Will watch BP once she is routinely using device for need to change dosing if she becomes hypotensive. Repeat echo in one year.    Long term (current) use of anticoagulants   Atrial fibrillation with RVR   Last Assessment & Plan   09/17/2012 Office Visit Written 09/17/2012 11:43 AM by Jodelle Gross, NP     She has had no recurrence of these symptoms. Will  continue flecainide. She refuses  stress test as she cannot walk on treadmill due to bad back. Due to low dose, will continue to monitor her response. She will be seen in 6 months unless symptomatic.    Obesity, unspecified   Hypokalemia       Imaging: No results found.

## 2013-03-11 NOTE — Progress Notes (Signed)
HPI: [name] is a 62 year old patient orally of Dr. Dietrich Pates, we are following for ongoing assessment and management of atrial fibrillation. Last seen in the office on 09/17/2012 after being placed on flecainide during recent hospitalization for atrial fibrillation with RVR. She refused followup stress test, as she states she could not walk on the treadmill due to back problems.   She is without complaints of racing heart rate palpitations or DOE. She is doing well and is medically compliant.   Allergies  Allergen Reactions  . Aspirin Nausea And Vomiting    Current Outpatient Prescriptions  Medication Sig Dispense Refill  . buPROPion (WELLBUTRIN SR) 150 MG 12 hr tablet TAKE 1 TABLET TWICE DAILY  180 tablet  1  . calcium carbonate (TUMS - DOSED IN MG ELEMENTAL CALCIUM) 500 MG chewable tablet Chew 1 tablet by mouth 3 (three) times daily as needed for heartburn.      . diclofenac (VOLTAREN) 75 MG EC tablet       . Diclofenac (ZORVOLEX) 35 MG CAPS Take by mouth.      . diltiazem (CARDIZEM CD) 180 MG 24 hr capsule Take 1 capsule (180 mg total) by mouth daily.  90 capsule  0  . flecainide (TAMBOCOR) 50 MG tablet Take 1 tablet (50 mg total) by mouth 2 (two) times daily.  60 tablet  6  . HYDROcodone-acetaminophen (NORCO/VICODIN) 5-325 MG per tablet Take 1.5 tablets by mouth at bedtime.  45 tablet  5  . metoprolol (LOPRESSOR) 25 MG tablet 1 tablet as needed for heart racing  10 tablet  0  . pantoprazole (PROTONIX) 40 MG tablet TAKE 1 TABLET EVERY MORNING  90 tablet  1  . Topiramate ER (TROKENDI XR) 50 MG CP24 Take by mouth.      . warfarin (COUMADIN) 5 MG tablet TAKE ONE EVERY DAY AS DIRECTED  90 tablet  1   No current facility-administered medications for this visit.    Past Medical History  Diagnosis Date  . DVT (deep venous thrombosis) age 51  . GERD (gastroesophageal reflux disease)   . Arthritis   . Lower extremity venous stasis     LLE, chronic  . Pulmonary embolism   .  Spinal stenosis   . Chronic back pain   . Warfarin anticoagulation   . Chronic anxiety     Patient describes as stress  . Tobacco abuse   . Atrial fibrillation   . Hypoestrogenism   . Headache(784.0)   . Venous stasis   . Reflux   . Sleep apnea     Past Surgical History  Procedure Laterality Date  . Ankle surgery      x2  . Cholecystectomy    . Carpal tunnel release    . Ovarian cyst removal    . Rotator cuff repair    . Tubal ligation    . Appendectomy      ZOX:[id number] of systems complete and found to be negative unless listed above  PHYSICAL EXAM BP 122/58  Pulse 83  Ht 5\' 7"  (1.702 m)  Wt 262 lb (118.842 kg)  BMI 41.03 kg/m2  General: Well developed, well nourished, in no acute distress Head: Eyes PERRLA, No xanthomas.   Normal cephalic and atramatic  Lungs: Clear bilaterally to auscultation and percussion. Heart: HRRR S1 S2, without MRG.  Pulses are 2+ & equal.            No carotid bruit. No JVD.  No abdominal bruits. No femoral bruits. Abdomen:  Bowel sounds are positive, abdomen soft and non-tender without masses or                  Hernia's noted. Msk:  Back normal, normal gait. Normal strength and tone for age. Extremities: No clubbing, cyanosis or edema.  DP +1 Neuro: Alert and oriented X 3. Psych:  Good affect, responds appropriately  EKG: NSR rate of 70 bpm.   ASSESSMENT AND PLAN

## 2013-03-11 NOTE — Progress Notes (Signed)
Name: Sarah [name]    DOB: [birth year]  Age: 62 y.o.  MR#: [id number]       PCP:  Harlow Asa, MD      Insurance: Payor: MEDICARE / Plan: MEDICARE PART A AND B / Product Type: *No Product type* /   CC:    Chief Complaint  Patient presents with  . Atrial Fibrillation    VS Filed Vitals:   03/11/13 1413  BP: 122/58  Pulse: 83  Height: 5\' 7"  (1.702 m)  Weight: 262 lb (118.842 kg)    Weights Current Weight  03/11/13 262 lb (118.842 kg)  12/19/12 269 lb (122.018 kg)  10/02/12 267 lb 3.2 oz (121.201 kg)    Blood Pressure  BP Readings from Last 3 Encounters:  03/11/13 122/58  12/19/12 122/78  10/02/12 138/79     Admit date:  (Not on file) Last encounter with RMR:  09/17/2012   Allergy Aspirin  Current Outpatient Prescriptions  Medication Sig Dispense Refill  . buPROPion (WELLBUTRIN SR) 150 MG 12 hr tablet TAKE 1 TABLET TWICE DAILY  180 tablet  1  . calcium carbonate (TUMS - DOSED IN MG ELEMENTAL CALCIUM) 500 MG chewable tablet Chew 1 tablet by mouth 3 (three) times daily as needed for heartburn.      . diclofenac (VOLTAREN) 75 MG EC tablet       . Diclofenac (ZORVOLEX) 35 MG CAPS Take by mouth.      . diltiazem (CARDIZEM CD) 180 MG 24 hr capsule Take 1 capsule (180 mg total) by mouth daily.  90 capsule  0  . flecainide (TAMBOCOR) 50 MG tablet Take 1 tablet (50 mg total) by mouth 2 (two) times daily.  60 tablet  6  . HYDROcodone-acetaminophen (NORCO/VICODIN) 5-325 MG per tablet Take 1.5 tablets by mouth at bedtime.  45 tablet  5  . metoprolol (LOPRESSOR) 25 MG tablet 1 tablet as needed for heart racing  10 tablet  0  . pantoprazole (PROTONIX) 40 MG tablet TAKE 1 TABLET EVERY MORNING  90 tablet  1  . Topiramate ER (TROKENDI XR) 50 MG CP24 Take by mouth.      . warfarin (COUMADIN) 5 MG tablet TAKE ONE EVERY DAY AS DIRECTED  90 tablet  1   No current facility-administered medications for this visit.    Discontinued Meds:   There are no discontinued medications.  Patient  Active Problem List   Diagnosis Date Noted  . Atrial fibrillation with RVR 08/30/2012  . Obesity, unspecified 08/30/2012  . Hypokalemia 08/30/2012  . Long term (current) use of anticoagulants 08/03/2012  . OSA (obstructive sleep apnea) 05/23/2012  . History of pulmonary embolism 12/28/2011  . Tobacco abuse 12/28/2011  . Chronic anxiety 12/28/2011  . GERD (gastroesophageal reflux disease) 12/28/2011  . Warfarin anticoagulation 12/28/2011    LABS    Component Value Date/Time   NA 143 08/30/2012 0332   NA 141 05/12/2012 0355   NA 137 05/11/2012 1138   K 3.4* 08/30/2012 0332   K 4.0 05/12/2012 0355   K 3.8 05/11/2012 1138   CL 105 08/30/2012 0332   CL 103 05/12/2012 0355   CL 102 05/11/2012 1138   CO2 27 08/30/2012 0332   CO2 32 05/12/2012 0355   CO2 27 05/11/2012 1138   GLUCOSE 117* 08/30/2012 0332   GLUCOSE 107* 05/12/2012 0355   GLUCOSE 105* 05/11/2012 1138   BUN 13 08/30/2012 0332   BUN 7 05/12/2012 0355   BUN 9 05/11/2012 1138   CREATININE  1.00 08/30/2012 0332   CREATININE 1.02 05/12/2012 0355   CREATININE 0.83 05/11/2012 1138   CALCIUM 9.7 08/30/2012 0332   CALCIUM 8.9 05/12/2012 0355   CALCIUM 9.1 05/11/2012 1138   GFRNONAA 60* 08/30/2012 0332   GFRNONAA 58* 05/12/2012 0355   GFRNONAA 75* 05/11/2012 1138   GFRAA 69* 08/30/2012 0332   GFRAA 67* 05/12/2012 0355   GFRAA 86* 05/11/2012 1138   CMP     Component Value Date/Time   NA 143 08/30/2012 0332   K 3.4* 08/30/2012 0332   CL 105 08/30/2012 0332   CO2 27 08/30/2012 0332   GLUCOSE 117* 08/30/2012 0332   BUN 13 08/30/2012 0332   CREATININE 1.00 08/30/2012 0332   CALCIUM 9.7 08/30/2012 0332   PROT 7.4 08/30/2012 0332   ALBUMIN 4.2 08/30/2012 0332   AST 20 08/30/2012 0332   ALT 25 08/30/2012 0332   ALKPHOS 107 08/30/2012 0332   BILITOT 0.3 08/30/2012 0332   GFRNONAA 60* 08/30/2012 0332   GFRAA 69* 08/30/2012 0332       Component Value Date/Time   WBC 6.9 08/30/2012 0332   WBC 6.5 05/12/2012 0355   WBC 5.2 05/11/2012 1138   HGB 15.7* 08/30/2012 0332   HGB 13.9 05/12/2012  0355   HGB 14.7 05/11/2012 1138   HCT 45.4 08/30/2012 0332   HCT 42.3 05/12/2012 0355   HCT 43.9 05/11/2012 1138   MCV 92.8 08/30/2012 0332   MCV 95.5 05/12/2012 0355   MCV 94.4 05/11/2012 1138    Lipid Panel  No results found for this basename: chol, trig, hdl, cholhdl, vldl, ldlcalc    ABG No results found for this basename: phart, pco2, pco2art, po2, po2art, hco3, tco2, acidbasedef, o2sat     Lab Results  Component Value Date   TSH 1.202 05/11/2012   BNP (last 3 results) No results found for this basename: PROBNP,  in the last 8760 hours Cardiac Panel (last 3 results) No results found for this basename: CKTOTAL, CKMB, TROPONINI, RELINDX,  in the last 72 hours  Iron/TIBC/Ferritin No results found for this basename: iron, tibc, ferritin     EKG Orders placed in visit on 09/17/12  . EKG 12-LEAD     Prior Assessment and Plan Problem List as of 03/11/2013   History of pulmonary embolism   Tobacco abuse   Last Assessment & Plan   09/03/2012 Office Visit Written 09/03/2012  2:29 PM by Jodelle Gross, NP     She has stopped smoking. Has no plans to restart. I have encouraged her in this endeavor.    Chronic anxiety   GERD (gastroesophageal reflux disease)   Warfarin anticoagulation   Last Assessment & Plan   09/17/2012 Office Visit Written 09/17/2012 11:44 AM by Jodelle Gross, NP     Continue treatment regimen.    OSA (obstructive sleep apnea)   Last Assessment & Plan   05/23/2012 Office Visit Written 05/23/2012  3:16 PM by Jodelle Gross, NP     Recently diagnosed by Dr. Gerda Diss. She is to have CPAP fitted. Will watch BP once she is routinely using device for need to change dosing if she becomes hypotensive. Repeat echo in one year.    Long term (current) use of anticoagulants   Atrial fibrillation with RVR   Last Assessment & Plan   09/17/2012 Office Visit Written 09/17/2012 11:43 AM by Jodelle Gross, NP     She has had no recurrence of these symptoms. Will  continue flecainide. She refuses  stress test as she cannot walk on treadmill due to bad back. Due to low dose, will continue to monitor her response. She will be seen in 6 months unless symptomatic.    Obesity, unspecified   Hypokalemia       Imaging: No results found.

## 2013-03-11 NOTE — Addendum Note (Signed)
Addended by: Marlyn Corporal A on: 03/11/2013 05:25 PM   Modules accepted: Orders

## 2013-03-11 NOTE — Assessment & Plan Note (Signed)
Smoking cessation is strongly recommended.

## 2013-03-11 NOTE — Assessment & Plan Note (Signed)
Continues on coumadin. No changes to medication regimen.  Follow INR and coumadin dosing.

## 2013-03-11 NOTE — Patient Instructions (Signed)
Your physician recommends that you continue on your current medications as directed. Please refer to the Current Medication list given to you today.  Your physician wants you to follow-up in: 6 months. You will receive a reminder letter in the mail two months in advance. If you don't receive a letter, please call our office to schedule the follow-up appointment.  

## 2013-03-11 NOTE — Assessment & Plan Note (Signed)
Continues in NSR without complaints of palpitations. Continue diltiazem as directed.

## 2013-04-08 ENCOUNTER — Ambulatory Visit (INDEPENDENT_AMBULATORY_CARE_PROVIDER_SITE_OTHER): Payer: Medicare Other | Admitting: *Deleted

## 2013-04-08 DIAGNOSIS — Z7901 Long term (current) use of anticoagulants: Secondary | ICD-10-CM

## 2013-04-08 LAB — POCT INR: INR: 3.8

## 2013-04-22 ENCOUNTER — Ambulatory Visit (INDEPENDENT_AMBULATORY_CARE_PROVIDER_SITE_OTHER): Payer: Medicare Other | Admitting: *Deleted

## 2013-04-22 DIAGNOSIS — Z7901 Long term (current) use of anticoagulants: Secondary | ICD-10-CM | POA: Diagnosis not present

## 2013-04-22 LAB — POCT INR: INR: 2.4

## 2013-04-22 NOTE — Patient Instructions (Signed)
2.5mg Wednesday, Friday, and Sunday. 5mg all other days. Recheck in 1 month.  

## 2013-04-28 ENCOUNTER — Telehealth: Payer: Self-pay | Admitting: Family Medicine

## 2013-04-28 MED ORDER — HYDROCODONE-ACETAMINOPHEN 5-325 MG PO TABS: 1.5000 | ORAL_TABLET | Freq: Every day | ORAL | Status: DC

## 2013-04-28 NOTE — Telephone Encounter (Signed)
Pt needs refill on her Hydrocodone, please call when done [phone]

## 2013-04-28 NOTE — Telephone Encounter (Signed)
Rx printed and up front for patient pick up. Patient notified. 

## 2013-04-28 NOTE — Telephone Encounter (Signed)
Ok times one mo, will need ov soon

## 2013-05-05 ENCOUNTER — Telehealth: Payer: Self-pay | Admitting: Family Medicine

## 2013-05-05 NOTE — Telephone Encounter (Signed)
Patient notified and faxed

## 2013-05-05 NOTE — Telephone Encounter (Signed)
Patients dentist wants to pull patients abscessed tooth tomorrow, but is requesting that she skips tonight's coumadin dose. Patient needs a verbal okay to let dentist know and a written order faxed to (Fax) [phone] giving the OK for patient to skip this coumadin dose.

## 2013-05-05 NOTE — Telephone Encounter (Signed)
Ok wis

## 2013-05-23 ENCOUNTER — Ambulatory Visit (INDEPENDENT_AMBULATORY_CARE_PROVIDER_SITE_OTHER): Payer: Medicare Other | Admitting: *Deleted

## 2013-05-23 DIAGNOSIS — Z7901 Long term (current) use of anticoagulants: Secondary | ICD-10-CM | POA: Diagnosis not present

## 2013-05-23 LAB — POCT INR: INR: 3

## 2013-05-23 NOTE — Patient Instructions (Signed)
2.5mg  Wednesday, Friday, and Sunday. 5mg  all other days. Recheck in 1 month.

## 2013-05-27 ENCOUNTER — Telehealth: Payer: Self-pay | Admitting: Family Medicine

## 2013-05-27 ENCOUNTER — Other Ambulatory Visit: Payer: Self-pay | Admitting: *Deleted

## 2013-05-27 ENCOUNTER — Other Ambulatory Visit: Payer: Self-pay | Admitting: Adult Health

## 2013-05-27 MED ORDER — HYDROCODONE-ACETAMINOPHEN 5-325 MG PO TABS: 1.5000 | ORAL_TABLET | Freq: Every day | ORAL | Status: DC

## 2013-05-27 NOTE — Telephone Encounter (Signed)
Ok times one, will need ov before further

## 2013-05-27 NOTE — Telephone Encounter (Signed)
Patient needs refill on hydrocodone 5/325.

## 2013-05-27 NOTE — Telephone Encounter (Signed)
Script ready for pickup. Pt has an appt in feb. Pt notified.

## 2013-06-23 ENCOUNTER — Ambulatory Visit (INDEPENDENT_AMBULATORY_CARE_PROVIDER_SITE_OTHER): Payer: Medicare Other | Admitting: Family Medicine

## 2013-06-23 ENCOUNTER — Encounter: Payer: Self-pay | Admitting: Family Medicine

## 2013-06-23 VITALS — BP 132/80 | Ht 67.0 in | Wt 270.0 lb

## 2013-06-23 DIAGNOSIS — K219 Gastro-esophageal reflux disease without esophagitis: Secondary | ICD-10-CM

## 2013-06-23 DIAGNOSIS — M1990 Unspecified osteoarthritis, unspecified site: Secondary | ICD-10-CM | POA: Insufficient documentation

## 2013-06-23 DIAGNOSIS — I4891 Unspecified atrial fibrillation: Secondary | ICD-10-CM

## 2013-06-23 DIAGNOSIS — Z7901 Long term (current) use of anticoagulants: Secondary | ICD-10-CM | POA: Diagnosis not present

## 2013-06-23 DIAGNOSIS — M129 Arthropathy, unspecified: Secondary | ICD-10-CM

## 2013-06-23 DIAGNOSIS — Z86711 Personal history of pulmonary embolism: Secondary | ICD-10-CM | POA: Diagnosis not present

## 2013-06-23 LAB — POCT INR: INR: 2.6

## 2013-06-23 MED ORDER — HYDROCODONE-ACETAMINOPHEN 5-325 MG PO TABS: 1.5000 | ORAL_TABLET | Freq: Every day | ORAL | Status: DC

## 2013-06-23 NOTE — Progress Notes (Signed)
   Subjective:    Patient ID: Sarah [name], female    DOB: August 24, 1950, 63 y.o.   MRN: [id number]  Atrial Fibrillation Presents for follow-up visit. Past medical history includes atrial fibrillation.  INR 2.6 today. Patient taking 5mg  coumadin on Monday, tues, thurs, sat and 1/2 tablet on wed, frid, and Sunday.  Patient reports no concerns today. Patient tries not to Miss any doses. Results for orders placed in visit on 06/23/13  POCT INR      Result Value Ref Range   INR 2.6     Tries not to midd ythr fodr  Takes pain med at night, wrking fair to help sleep.  Saw card around oct or nov, went well,   Maintain the usual meds. Reflux overall is stable as long as takes with medicine.  No further spells of rapid heart rate.  Ongoing difficulty with arthritis pain. Primarily in the spine and major joints. Nighttime hydrocodone helps the patient rest.  Reports chronic anxiety overall is stable. Compliant with the Wellbutrin for this.    Review of Systems No chest pain no headache no new back pain no abdominal pain no change in bowel habits ROS otherwise negative    Objective:   Physical Exam  Alert HEENT normal. Blood pressure good on repeat. Lungs clear. Heart rhythm currently regular. Low back some pain to percussion. Ankles without edema.      Assessment & Plan:  Impression 1 chronic anticoagulation discussed numbers good. #2 reflux clinically stable. #3 atrial fibrillation also stable. #4 chronic pain. Discussed. Patient needs hydrocodone nightly. No obvious side effects from medicine. #5 chronic smoker discussed encouraged to stop plan meds refilled. Maintain same dose of Coumadin. Warning signs discussed. Need CEA rules discussed. Monthly INRs. Office visit every 3 months. WSL

## 2013-06-23 NOTE — Patient Instructions (Signed)
Continue the same dose. Recheck in one month.

## 2013-06-23 NOTE — Progress Notes (Deleted)
   Subjective:    Patient ID: Sarah [name], female    DOB: March 15, 1951, 63 y.o.   MRN: [id number]  Anxiety Presents for follow-up visit.   Compliance with medications is 76-100%.      Review of Systems     Objective:   Physical Exam        Assessment & Plan:

## 2013-07-22 ENCOUNTER — Ambulatory Visit (INDEPENDENT_AMBULATORY_CARE_PROVIDER_SITE_OTHER): Payer: Medicare Other

## 2013-07-22 DIAGNOSIS — Z7901 Long term (current) use of anticoagulants: Secondary | ICD-10-CM

## 2013-07-22 LAB — POCT INR: INR: 2.8

## 2013-07-24 ENCOUNTER — Telehealth: Payer: Self-pay | Admitting: Adult Health

## 2013-07-24 NOTE — Telephone Encounter (Signed)
Please see paper in refill bin / tgs  °

## 2013-07-25 MED ORDER — FLECAINIDE ACETATE 50 MG PO TABS: 50.0000 mg | ORAL_TABLET | Freq: Two times a day (BID) | ORAL | Status: DC

## 2013-07-25 NOTE — Telephone Encounter (Signed)
90 day supply flecainide filled

## 2013-08-22 ENCOUNTER — Ambulatory Visit (INDEPENDENT_AMBULATORY_CARE_PROVIDER_SITE_OTHER): Payer: Medicare Other | Admitting: *Deleted

## 2013-08-22 DIAGNOSIS — Z7901 Long term (current) use of anticoagulants: Secondary | ICD-10-CM

## 2013-08-22 LAB — POCT INR: INR: 2.4

## 2013-08-22 NOTE — Patient Instructions (Signed)
Continue the same dose. Take one half tablet sun, wed, Friday and one tablet all other days. Recheck in one month.

## 2013-09-06 ENCOUNTER — Other Ambulatory Visit: Payer: Self-pay | Admitting: Family Medicine

## 2013-09-09 ENCOUNTER — Other Ambulatory Visit: Payer: Self-pay | Admitting: Family Medicine

## 2013-09-23 ENCOUNTER — Encounter: Payer: Self-pay | Admitting: Family Medicine

## 2013-09-23 ENCOUNTER — Ambulatory Visit (INDEPENDENT_AMBULATORY_CARE_PROVIDER_SITE_OTHER): Payer: Medicare Other | Admitting: Family Medicine

## 2013-09-23 VITALS — BP 130/70 | Ht 67.0 in | Wt 270.0 lb

## 2013-09-23 DIAGNOSIS — G4733 Obstructive sleep apnea (adult) (pediatric): Secondary | ICD-10-CM

## 2013-09-23 DIAGNOSIS — Z7901 Long term (current) use of anticoagulants: Secondary | ICD-10-CM

## 2013-09-23 DIAGNOSIS — I4891 Unspecified atrial fibrillation: Secondary | ICD-10-CM

## 2013-09-23 DIAGNOSIS — Z86711 Personal history of pulmonary embolism: Secondary | ICD-10-CM

## 2013-09-23 DIAGNOSIS — M1990 Unspecified osteoarthritis, unspecified site: Secondary | ICD-10-CM

## 2013-09-23 DIAGNOSIS — M129 Arthropathy, unspecified: Secondary | ICD-10-CM

## 2013-09-23 LAB — POCT INR: INR: 4.7

## 2013-09-23 MED ORDER — HYDROCODONE-ACETAMINOPHEN 5-325 MG PO TABS: 1.0000 | ORAL_TABLET | Freq: Two times a day (BID) | ORAL | Status: DC | PRN

## 2013-09-23 NOTE — Progress Notes (Signed)
   Subjective:    Patient ID: Sarah [name], female    DOB: 06-Jan-1951, 63 y.o.   MRN: [id number]  HPI Patient is here today for her follow up visit on her arthritis.   Patient needs new RX for her CPAP machine. Needs ongoing rx for masks and tubing. Definitely helps  Exercise not so good.  Ongoing chronic back pain, saw dr Ace Gins had tow inject, helped a little or not  Minimal rad to the leg  And right foot discomfort  Patient's INR today is 4.7. Please fill in coumadin sheet on front of chart.  Patient states that her back pain is worsening and she would like to discuss this today.    Review of Systems No chest pain and back pain no abdominal pain no change about habits no blood in stool ROS otherwise negative    Objective:   Physical Exam Alert no acute distress. Positive low back pain to percussion. Significant arthritis changes throughout. Trace edema. Heart rhythm irregular but in good control.       Assessment & Plan:  Impression 1 chronic anticoagulation review INR too high. #2 A. fib clinically stable. #3 chronic severe arthritis. #4 reflux clinically stable. #5 sleep apnea responding well to machine. Plan diet exercise discussed. Maintain same medications. Pain medications refilled. Coumadin adjusted. CPAP tubing prescribed.

## 2013-10-07 ENCOUNTER — Ambulatory Visit (INDEPENDENT_AMBULATORY_CARE_PROVIDER_SITE_OTHER): Payer: Medicare Other | Admitting: *Deleted

## 2013-10-07 DIAGNOSIS — Z7901 Long term (current) use of anticoagulants: Secondary | ICD-10-CM

## 2013-10-07 LAB — POCT INR: INR: 3

## 2013-10-07 NOTE — Patient Instructions (Signed)
1/2 tab on Sun, Tues, Wed, Fri and 1 tab all other days

## 2013-10-07 NOTE — Addendum Note (Signed)
Addended byCharolotte Capuchin D on: 10/07/2013 01:29 PM   Modules accepted: Level of Service

## 2013-10-24 ENCOUNTER — Other Ambulatory Visit: Payer: Self-pay | Admitting: Family Medicine

## 2013-11-04 ENCOUNTER — Ambulatory Visit (INDEPENDENT_AMBULATORY_CARE_PROVIDER_SITE_OTHER): Payer: Medicare Other | Admitting: *Deleted

## 2013-11-04 DIAGNOSIS — Z7901 Long term (current) use of anticoagulants: Secondary | ICD-10-CM

## 2013-11-04 LAB — POCT INR: INR: 2.5

## 2013-11-04 NOTE — Addendum Note (Signed)
Addended byCharolotte Capuchin D on: 11/04/2013 10:40 AM   Modules accepted: Level of Service

## 2013-11-04 NOTE — Patient Instructions (Signed)
1/2 tab on Sun, Tues, Wed, Fri and 1 tab all other days. Recheck in 4 weeks.

## 2013-12-02 ENCOUNTER — Ambulatory Visit (INDEPENDENT_AMBULATORY_CARE_PROVIDER_SITE_OTHER): Payer: Medicare Other | Admitting: *Deleted

## 2013-12-02 DIAGNOSIS — Z7901 Long term (current) use of anticoagulants: Secondary | ICD-10-CM | POA: Diagnosis not present

## 2013-12-02 LAB — POCT INR: INR: 3.9

## 2013-12-09 ENCOUNTER — Telehealth: Payer: Self-pay | Admitting: Family Medicine

## 2013-12-09 NOTE — Telephone Encounter (Signed)
Patient said she forgot the instructions on her coumadin and would like someone to call her back.

## 2013-12-09 NOTE — Telephone Encounter (Signed)
Discussed with pt

## 2014-01-01 ENCOUNTER — Ambulatory Visit (INDEPENDENT_AMBULATORY_CARE_PROVIDER_SITE_OTHER): Payer: Medicare Other | Admitting: Family Medicine

## 2014-01-01 ENCOUNTER — Encounter: Payer: Self-pay | Admitting: Family Medicine

## 2014-01-01 VITALS — BP 132/70 | Ht 67.0 in | Wt 271.2 lb

## 2014-01-01 DIAGNOSIS — Z7901 Long term (current) use of anticoagulants: Secondary | ICD-10-CM | POA: Diagnosis not present

## 2014-01-01 DIAGNOSIS — G4733 Obstructive sleep apnea (adult) (pediatric): Secondary | ICD-10-CM | POA: Diagnosis not present

## 2014-01-01 DIAGNOSIS — M129 Arthropathy, unspecified: Secondary | ICD-10-CM | POA: Diagnosis not present

## 2014-01-01 DIAGNOSIS — K219 Gastro-esophageal reflux disease without esophagitis: Secondary | ICD-10-CM | POA: Diagnosis not present

## 2014-01-01 DIAGNOSIS — M1990 Unspecified osteoarthritis, unspecified site: Secondary | ICD-10-CM

## 2014-01-01 MED ORDER — HYDROCODONE-ACETAMINOPHEN 5-325 MG PO TABS: 1.0000 | ORAL_TABLET | Freq: Two times a day (BID) | ORAL | Status: DC | PRN

## 2014-01-01 NOTE — Progress Notes (Signed)
   Subjective:    Patient ID: Sarah [name], female    DOB: Jul 08, 1950, 63 y.o.   MRN: [id number]  HPI Patient is here for her follow up visit on atrial fibrillation/medication check up. Patient is doing well. INR today is 2.4. Patient states that she has no other concerns at this time.   Sleep ap device reg, still helping. No obvious side effects from it.  Pain about the same, still needs med. Occasional constipation pain medicine. Due to severe arthritis feels he definitely needs it.  Not exercising , likely due to both maladies and motiv,  Compliant with coumadin,   Only one spell of rapid heart rate recently.   Review of Systems No headache no chest pain no back pain no shortness breath no abdominal pain ROS otherwise negative    Objective:   Physical Exam  Alert no apparent distress. Lungs clear. Heart regular rate and rhythm. HEENT normal. Diffuse changes of arthritis. Ankles without edema obesity noted vital stable.      Assessment & Plan:  Impression 1 chronic anticoagulation INR excellent discussed #2 chronic pain ongoing need for meds discussed #3 sleep apnea ongoing. #4 atrial fibrillation #5 sleep apnea stable clinically stable plan diet exercise discussed in encourage. Narcotics prescribed for 3 months. Maintain same INR dosage. WSL

## 2014-01-02 ENCOUNTER — Ambulatory Visit: Payer: Self-pay | Admitting: Family Medicine

## 2014-01-30 ENCOUNTER — Ambulatory Visit (INDEPENDENT_AMBULATORY_CARE_PROVIDER_SITE_OTHER): Payer: Medicare Other

## 2014-01-30 ENCOUNTER — Ambulatory Visit: Payer: Medicare Other

## 2014-01-30 DIAGNOSIS — Z7901 Long term (current) use of anticoagulants: Secondary | ICD-10-CM | POA: Diagnosis not present

## 2014-01-30 LAB — POCT INR: INR: 2.4

## 2014-02-27 ENCOUNTER — Ambulatory Visit (INDEPENDENT_AMBULATORY_CARE_PROVIDER_SITE_OTHER): Payer: Medicare Other | Admitting: *Deleted

## 2014-02-27 DIAGNOSIS — Z7901 Long term (current) use of anticoagulants: Secondary | ICD-10-CM | POA: Diagnosis not present

## 2014-02-27 DIAGNOSIS — Z23 Encounter for immunization: Secondary | ICD-10-CM | POA: Diagnosis not present

## 2014-02-27 LAB — POCT INR: INR: 2.1

## 2014-02-27 NOTE — Patient Instructions (Signed)
Take one tablet on Monday and Saturday and take one half tablet all other days. Recheck in 4 weeks.

## 2014-03-25 ENCOUNTER — Ambulatory Visit (INDEPENDENT_AMBULATORY_CARE_PROVIDER_SITE_OTHER): Payer: Medicare Other | Admitting: Family Medicine

## 2014-03-25 ENCOUNTER — Encounter: Payer: Self-pay | Admitting: Family Medicine

## 2014-03-25 VITALS — BP 142/82 | Ht 67.0 in | Wt 273.0 lb

## 2014-03-25 DIAGNOSIS — M1990 Unspecified osteoarthritis, unspecified site: Secondary | ICD-10-CM | POA: Diagnosis not present

## 2014-03-25 DIAGNOSIS — Z7901 Long term (current) use of anticoagulants: Secondary | ICD-10-CM

## 2014-03-25 DIAGNOSIS — K219 Gastro-esophageal reflux disease without esophagitis: Secondary | ICD-10-CM | POA: Diagnosis not present

## 2014-03-25 LAB — POCT INR: INR: 3.1

## 2014-03-25 MED ORDER — HYDROCODONE-ACETAMINOPHEN 5-325 MG PO TABS: 1.0000 | ORAL_TABLET | Freq: Two times a day (BID) | ORAL | Status: DC | PRN

## 2014-03-25 MED ORDER — AMOXICILLIN-POT CLAVULANATE 875-125 MG PO TABS: 1.0000 | ORAL_TABLET | Freq: Two times a day (BID) | ORAL | Status: AC

## 2014-03-25 NOTE — Progress Notes (Signed)
   Subjective:    Patient ID: Sarah [name], female    DOB: 1951-03-08, 63 y.o.   MRN: [id number]  HPI Patient arrives for a follow up on atrial fib/coumadin use.   Patient also having problems with sinus congestion for 3 weeks.started as a cold, with head congestion and bad cough. guny production.  Sore throat in the morn  Chronic pain meds and compliant  Takes the pain meds primarily at night  INr slightly elev at 3.1   Patient also follows up for her chronic pain medication. States definitely needs it. History of chronic back pain. Pain medicine allows her to function. No obvious side effects with it. Compliant with it.  1 spell rapid heart rate last month. No chest pain or shortness of breath.  Review of Systems No headache no chest pain no abdominal pain no change in bowel habits no blood in stool ROS otherwise negative    Objective:   Physical Exam  Alert no acute distress. Mild malaise. HEENT moderate nasal congestion frontal tenderness. Pharynx slight erythema neck supple. Lungs clear. Heart regular in rhythm.      Assessment & Plan:  Impression 1 acute rhinosinusitis. #2 chronic pain discussed #3 atrial fibrillation discussed #4 chronic anticoagulation INR slightly high at 3.1. Normally would not change, however initiating antibiotics discussed plan Augmentin twice a day 10 days. Pain medications refilled. Maintain all other medicines. Diet exercise discussed. Knocked down Coumadin tablet to just one half on Saturdays. Recheck latter part of next week. Chronic visit in 3 months. WSL

## 2014-03-27 ENCOUNTER — Ambulatory Visit: Payer: Medicare Other

## 2014-04-03 ENCOUNTER — Ambulatory Visit (INDEPENDENT_AMBULATORY_CARE_PROVIDER_SITE_OTHER): Payer: Medicare Other

## 2014-04-03 DIAGNOSIS — Z7901 Long term (current) use of anticoagulants: Secondary | ICD-10-CM

## 2014-04-03 LAB — POCT INR: INR: 2

## 2014-04-16 ENCOUNTER — Other Ambulatory Visit: Payer: Self-pay | Admitting: *Deleted

## 2014-04-16 MED ORDER — CIPROFLOXACIN HCL 250 MG PO TABS: 250.0000 mg | ORAL_TABLET | Freq: Two times a day (BID) | ORAL | Status: DC

## 2014-04-30 ENCOUNTER — Ambulatory Visit (INDEPENDENT_AMBULATORY_CARE_PROVIDER_SITE_OTHER): Payer: Medicare Other

## 2014-04-30 DIAGNOSIS — Z7901 Long term (current) use of anticoagulants: Secondary | ICD-10-CM | POA: Diagnosis not present

## 2014-04-30 LAB — POCT INR: INR: 2.5

## 2014-05-02 ENCOUNTER — Other Ambulatory Visit: Payer: Self-pay | Admitting: Family Medicine

## 2014-05-04 ENCOUNTER — Other Ambulatory Visit: Payer: Self-pay | Admitting: Family Medicine

## 2014-05-06 ENCOUNTER — Other Ambulatory Visit: Payer: Self-pay | Admitting: Adult Health

## 2014-05-26 ENCOUNTER — Telehealth: Payer: Self-pay | Admitting: Family Medicine

## 2014-05-26 NOTE — Telephone Encounter (Signed)
Rx faxed as requested

## 2014-05-26 NOTE — Telephone Encounter (Signed)
Pt called requesting a script to be [facility]ed [facility] For her to be able to get her cpap supplies. The fax number is [phone].

## 2014-05-29 ENCOUNTER — Ambulatory Visit (INDEPENDENT_AMBULATORY_CARE_PROVIDER_SITE_OTHER): Payer: Medicare Other | Admitting: *Deleted

## 2014-05-29 DIAGNOSIS — Z7901 Long term (current) use of anticoagulants: Secondary | ICD-10-CM

## 2014-05-29 LAB — POCT INR: INR: 1.9

## 2014-06-25 ENCOUNTER — Ambulatory Visit (INDEPENDENT_AMBULATORY_CARE_PROVIDER_SITE_OTHER): Payer: Medicare Other | Admitting: Family Medicine

## 2014-06-25 ENCOUNTER — Encounter: Payer: Self-pay | Admitting: Family Medicine

## 2014-06-25 VITALS — Ht 67.0 in | Wt 281.0 lb

## 2014-06-25 DIAGNOSIS — M1990 Unspecified osteoarthritis, unspecified site: Secondary | ICD-10-CM

## 2014-06-25 DIAGNOSIS — Z7901 Long term (current) use of anticoagulants: Secondary | ICD-10-CM

## 2014-06-25 DIAGNOSIS — K219 Gastro-esophageal reflux disease without esophagitis: Secondary | ICD-10-CM

## 2014-06-25 LAB — POCT INR: INR: 2

## 2014-06-25 MED ORDER — HYDROCODONE-ACETAMINOPHEN 5-325 MG PO TABS: 1.0000 | ORAL_TABLET | Freq: Two times a day (BID) | ORAL | Status: DC | PRN

## 2014-06-25 NOTE — Patient Instructions (Signed)

## 2014-06-25 NOTE — Progress Notes (Signed)
   Subjective:    Patient ID: Sarah [name], female    DOB: [birth year], 64 y.o.   MRN: [id number] Patient arrives office with multiple concerns   HPI Pt is here today for INR check 2.0. Patient claims compliance with Coumadin. No obvious side effects. No symptoms of DVT no excess bleeding.  This patient was seen today for chronic pain  The medication list was reviewed and updated.   -Compliance with pain medication: Yes  The patient was advised the importance of maintaining medication and not using illegal substances with these.  Refills needed: Yes, last filled 05/31/14  The patient was educated that we can provide 3 monthly scripts for their medication, it is their responsibility to follow the instructions.  Side effects or complications from medications: No  Patient is aware that pain medications are meant to minimize the severity of the pain to allow their pain levels to improve to allow for better function. They are aware of that pain medications cannot totally remove their pain.  Due for UDT ( at least once per year) :      Patient notes ongoing challenges with reflux and heartburn. States definitely needs medication for that.  Notes progressive back pain. Primarily lumbar region. Worse with certain motions.   Review of Systems No headache no chest pain ongoing back pain no change in bowel habits no blood in stool ROS otherwise negative    Objective:   Physical Exam  Alert vital stable HET normal neck supple lungs clear. Heart rare rhythm low back tender to palpation negative straight leg raise abdominal exam benign      Assessment & Plan:  Impression chronic anticoagulation stable #2 intermittent atrial fibrillation clinically stable #3 reflux stable along with medicines. #4 chronic pain ongoing somewhat worse plan medications refilled. Diet exercise discussed. Preventative checkup scheduled. WSL

## 2014-07-21 ENCOUNTER — Other Ambulatory Visit: Payer: Self-pay | Admitting: Adult Health

## 2014-07-24 ENCOUNTER — Ambulatory Visit (INDEPENDENT_AMBULATORY_CARE_PROVIDER_SITE_OTHER): Payer: Medicare Other | Admitting: Nurse Practitioner

## 2014-07-24 ENCOUNTER — Encounter: Payer: Self-pay | Admitting: Nurse Practitioner

## 2014-07-24 VITALS — BP 132/80 | Ht 67.0 in | Wt 274.8 lb

## 2014-07-24 DIAGNOSIS — Z01419 Encounter for gynecological examination (general) (routine) without abnormal findings: Secondary | ICD-10-CM

## 2014-07-24 DIAGNOSIS — Z124 Encounter for screening for malignant neoplasm of cervix: Secondary | ICD-10-CM

## 2014-07-24 DIAGNOSIS — E785 Hyperlipidemia, unspecified: Secondary | ICD-10-CM | POA: Diagnosis not present

## 2014-07-24 DIAGNOSIS — R5383 Other fatigue: Secondary | ICD-10-CM

## 2014-07-24 DIAGNOSIS — Z0000 Encounter for general adult medical examination without abnormal findings: Secondary | ICD-10-CM | POA: Diagnosis not present

## 2014-07-24 DIAGNOSIS — Z7901 Long term (current) use of anticoagulants: Secondary | ICD-10-CM

## 2014-07-24 DIAGNOSIS — J302 Other seasonal allergic rhinitis: Secondary | ICD-10-CM

## 2014-07-24 LAB — POCT INR: INR: 1.9

## 2014-07-25 LAB — BASIC METABOLIC PANEL
BUN/Creatinine Ratio: 10 — ABNORMAL LOW (ref 11–26)
BUN: 10 mg/dL (ref 8–27)
CALCIUM: 9.2 mg/dL (ref 8.7–10.3)
CO2: 23 mmol/L (ref 18–29)
Chloride: 102 mmol/L (ref 97–108)
Creatinine, Ser: 1.02 mg/dL — ABNORMAL HIGH (ref 0.57–1.00)
GFR calc Af Amer: 68 mL/min/{1.73_m2} (ref >59)
GFR, EST NON AFRICAN AMERICAN: 59 mL/min/{1.73_m2} — AB (ref >59)
Glucose: 90 mg/dL (ref 65–99)
Potassium: 4.2 mmol/L (ref 3.5–5.2)
SODIUM: 142 mmol/L (ref 134–144)

## 2014-07-25 LAB — LIPID PANEL
CHOL/HDL RATIO: 3 ratio (ref 0.0–4.4)
CHOLESTEROL TOTAL: 183 mg/dL (ref 100–199)
HDL: 61 mg/dL (ref >39)
LDL Calculated: 94 mg/dL (ref 0–99)
Triglycerides: 138 mg/dL (ref 0–149)
VLDL CHOLESTEROL CAL: 28 mg/dL (ref 5–40)

## 2014-07-25 LAB — HEPATIC FUNCTION PANEL
ALBUMIN: 4.4 g/dL (ref 3.6–4.8)
ALT: 18 IU/L (ref 0–32)
AST: 18 IU/L (ref 0–40)
Alkaline Phosphatase: 112 IU/L (ref 39–117)
Bilirubin Total: 0.3 mg/dL (ref 0.0–1.2)
Bilirubin, Direct: 0.09 mg/dL (ref 0.00–0.40)
TOTAL PROTEIN: 7 g/dL (ref 6.0–8.5)

## 2014-07-25 LAB — TSH: TSH: 1.9 u[IU]/mL (ref 0.450–4.500)

## 2014-07-27 ENCOUNTER — Encounter: Payer: Self-pay | Admitting: Nurse Practitioner

## 2014-07-27 NOTE — Progress Notes (Signed)
Subjective:    Patient ID: Sarah [name], female    DOB: [birth year], 64 y.o.   MRN: [id number]  HPI presents for her wellness exam. Married, same sexual partner. No vaginal bleeding or pelvic pain. Postmenopausal. Regular vision and dental exams. No regular activity. Has not done very well with her diet.    Review of Systems  Constitutional: Positive for fatigue. Negative for fever, activity change and appetite change.  HENT: Negative for dental problem, ear pain, sinus pressure and sore throat.   Respiratory: Positive for cough and wheezing. Negative for chest tightness and shortness of breath.        Cough over the past week, producing clear sputum. Rare wheezing.  Cardiovascular: Negative for chest pain.  Gastrointestinal: Negative for nausea, vomiting, abdominal pain, diarrhea, constipation and abdominal distention.  Genitourinary: Negative for dysuria, urgency, frequency, vaginal discharge, enuresis, difficulty urinating, genital sores, menstrual problem and pelvic pain.       Objective:   Physical Exam  Constitutional: She is oriented to person, place, and time. She appears well-developed. No distress.  HENT:  Right Ear: External ear normal.  Left Ear: External ear normal.  Mouth/Throat: Oropharynx is clear and moist. No oropharyngeal exudate.  TMs clear effusion, no erythema. Pharynx clear. Neck supple with mild anterior adenopathy.  Neck: Normal range of motion. Neck supple. No tracheal deviation present. No thyromegaly present.  Cardiovascular: Normal rate, regular rhythm and normal heart sounds.  Exam reveals no gallop.   No murmur heard. Pulmonary/Chest: Effort normal and breath sounds normal. She has no wheezes.  Abdominal: Soft. She exhibits no distension. There is no tenderness.  Genitourinary: Vagina normal and uterus normal. No vaginal discharge found.  External GU no rashes or lesions. Vagina slightly pale, no discharge. Cervix normal in appearance. No CMT.  Bimanual exam no tenderness or obvious masses, limited due to abdominal girth. Rectal exam no masses, no stool for Hemoccult.  Musculoskeletal: She exhibits no edema.  Lymphadenopathy:    She has cervical adenopathy.  Neurological: She is alert and oriented to person, place, and time.  Skin: Skin is warm and dry. No rash noted.  Psychiatric: She has a normal mood and affect. Her behavior is normal.  Vitals reviewed.  significant central obesity. Breast exam: No masses; axillae no adenopathy. INR 1.9.         Assessment & Plan:   Problem List Items Addressed This Visit      Other   Long term current use of anticoagulant therapy    Other Visit Diagnoses    Well woman exam    -  Primary    Relevant Orders    Pap IG w/ reflex to HPV when ASC-U    Lipid panel (Completed)    Hepatic function panel (Completed)    Basic metabolic panel (Completed)    TSH (Completed)    Screening for cervical cancer        Relevant Orders    Pap IG w/ reflex to HPV when ASC-U    Other fatigue        Relevant Orders    Basic metabolic panel (Completed)    TSH (Completed)    Hyperlipidemia        Relevant Orders    Lipid panel (Completed)    Other seasonal allergic rhinitis          Recommend healthy diet regular activity and weight loss. Given prescription for Zostavax. Strongly consider mammogram. Plans to get her colonoscopy this she her,  her mother had colon cancer. Is overdue for another screening. Continue current dose of Coumadin. Return in about 1 month (around 08/24/2014) for INR. Next physical in 1 year.

## 2014-07-28 LAB — PAP IG W/ RFLX HPV ASCU: PAP Smear Comment: 0

## 2014-08-21 ENCOUNTER — Ambulatory Visit: Payer: Medicare Other

## 2014-08-25 ENCOUNTER — Ambulatory Visit (INDEPENDENT_AMBULATORY_CARE_PROVIDER_SITE_OTHER): Payer: Medicare Other | Admitting: *Deleted

## 2014-08-25 DIAGNOSIS — Z7901 Long term (current) use of anticoagulants: Secondary | ICD-10-CM | POA: Diagnosis not present

## 2014-08-25 LAB — POCT INR: INR: 1.9

## 2014-09-21 ENCOUNTER — Other Ambulatory Visit: Payer: Self-pay | Admitting: Adult Health

## 2014-09-23 ENCOUNTER — Ambulatory Visit: Payer: Medicare Other | Admitting: Family Medicine

## 2014-09-24 ENCOUNTER — Other Ambulatory Visit: Payer: Self-pay | Admitting: Adult Health

## 2014-09-25 ENCOUNTER — Ambulatory Visit (INDEPENDENT_AMBULATORY_CARE_PROVIDER_SITE_OTHER): Payer: Medicare Other | Admitting: Family Medicine

## 2014-09-25 ENCOUNTER — Encounter: Payer: Self-pay | Admitting: Family Medicine

## 2014-09-25 VITALS — BP 128/74 | Ht 65.0 in | Wt 270.0 lb

## 2014-09-25 DIAGNOSIS — M1990 Unspecified osteoarthritis, unspecified site: Secondary | ICD-10-CM

## 2014-09-25 DIAGNOSIS — Z7901 Long term (current) use of anticoagulants: Secondary | ICD-10-CM | POA: Diagnosis not present

## 2014-09-25 DIAGNOSIS — Z79899 Other long term (current) drug therapy: Secondary | ICD-10-CM | POA: Diagnosis not present

## 2014-09-25 DIAGNOSIS — I4891 Unspecified atrial fibrillation: Secondary | ICD-10-CM | POA: Diagnosis not present

## 2014-09-25 LAB — POCT INR: INR: 1.8

## 2014-09-25 MED ORDER — FLECAINIDE ACETATE 50 MG PO TABS: ORAL_TABLET | ORAL | Status: DC

## 2014-09-25 MED ORDER — HYDROCODONE-ACETAMINOPHEN 5-325 MG PO TABS: 1.0000 | ORAL_TABLET | Freq: Two times a day (BID) | ORAL | Status: DC | PRN

## 2014-09-25 NOTE — Progress Notes (Signed)
   Subjective:    Patient ID: Sarah [name], female    DOB: 1950-06-18, 64 y.o.   MRN: [id number]  HPI This patient was seen today for chronic pain  The medication list was reviewed and updated.   -Compliance with pain medication: yes  The patient was advised the importance of maintaining medication and not using illegal substances with these.  Refills needed: yes  The patient was educated that we can provide 3 monthly scripts for their medication, it is their responsibility to follow the instructions.  Side effects or complications from medications: none  Patient is aware that pain medications are meant to minimize the severity of the pain to allow their pain levels to improve to allow for better function. They are aware of that pain medications cannot totally remove their pain.  Due for UDT ( at least once per year) : urine drug screen today  Needs handwritten script for flecainide. Receives this from the cardiologist. With the last prescription a recommended needs follow-up before further prescribed.   [facility] has a store with compr stockings. Wears venous stockings faithfully.  Patient claims compliance with Coumadin. Does not miss a dose. No excess bleeding.   o0  Review of Systems Positive chronic joint pain hip pain back pain. No headache no chest pain no shortness of breath    Objective:   Physical Exam  Alert vitals stable no acute distress HEENT normal. Lungs clear. Heart regular in rhythm. Low back tender to palpation. Legs bilateral venous stasis changes C INR      Assessment & Plan:  Impression chronic anticoagulation Coumadin adjusted #2 chronic pain discussed #3 atrial fibrillation currently stable on flecainide plan flecainide reviewed patient encouraged about with cardiologist him. Coumadin adjusted. Pain medications written. New pain contract given. Urine screen performed WSL

## 2014-09-25 NOTE — Patient Instructions (Signed)
Coumadin. Take one tablet on Wednesday and Saturday and take one half tablet all other days. Recheck in 4 weeks

## 2014-10-02 LAB — TOXASSURE SELECT 13 (MW), URINE: PDF: 0

## 2014-10-19 ENCOUNTER — Ambulatory Visit (INDEPENDENT_AMBULATORY_CARE_PROVIDER_SITE_OTHER): Payer: Medicare Other | Admitting: Adult Health

## 2014-10-19 ENCOUNTER — Other Ambulatory Visit: Payer: Self-pay | Admitting: *Deleted

## 2014-10-19 ENCOUNTER — Encounter: Payer: Self-pay | Admitting: Adult Health

## 2014-10-19 VITALS — BP 126/66 | HR 71 | Ht 67.0 in | Wt 276.0 lb

## 2014-10-19 DIAGNOSIS — I482 Chronic atrial fibrillation, unspecified: Secondary | ICD-10-CM

## 2014-10-19 LAB — EKG 12-LEAD

## 2014-10-19 MED ORDER — FLECAINIDE ACETATE 50 MG PO TABS: ORAL_TABLET | ORAL | Status: DC

## 2014-10-19 NOTE — Progress Notes (Deleted)
Name: Sarah [name]    DOB: [birth year]  Age: 64 y.o.  MR#: [id number]       PCP:  Mickie Hillier, MD      Insurance: Payor: MEDICARE / Plan: MEDICARE PART A AND B / Product Type: *No Product type* /   CC:    Chief Complaint  Patient presents with  . Atrial Fibrillation    VS Filed Vitals:   10/19/14 1351  BP: 126/66  Pulse: 71  Height: '5\' 7"'$  (1.702 m)  Weight: 276 lb (125.193 kg)  SpO2: 95%    Weights Current Weight  10/19/14 276 lb (125.193 kg)  09/25/14 270 lb (122.471 kg)  07/24/14 274 lb 12.8 oz (124.648 kg)    Blood Pressure  BP Readings from Last 3 Encounters:  10/19/14 126/66  09/25/14 128/74  07/24/14 132/80     Admit date:  (Not on file) Last encounter with RMR:  09/24/2014   Allergy Aspirin  Current Outpatient Prescriptions  Medication Sig Dispense Refill  . buPROPion (WELLBUTRIN SR) 150 MG 12 hr tablet TAKE 1 TABLET TWICE DAILY 180 tablet 1  . calcium carbonate (TUMS - DOSED IN MG ELEMENTAL CALCIUM) 500 MG chewable tablet Chew 1 tablet by mouth 3 (three) times daily as needed for heartburn.    . diltiazem (CARDIZEM CD) 180 MG 24 hr capsule TAKE ONE CAPSULE BY MOUTH EVERY DAY 90 capsule 1  . flecainide (TAMBOCOR) 50 MG tablet TAKE 1 TABLET (50 MG TOTAL) BY MOUTH 2 (TWO) TIMES DAILY. 60 tablet 0  . HYDROcodone-acetaminophen (NORCO/VICODIN) 5-325 MG per tablet Take 1 tablet by mouth 2 (two) times daily as needed for moderate pain. 60 tablet 0  . metoprolol (LOPRESSOR) 25 MG tablet 1 tablet as needed for heart racing 10 tablet 0  . pantoprazole (PROTONIX) 40 MG tablet TAKE 1 TABLET EVERY MORNING 90 tablet 1  . warfarin (COUMADIN) 5 MG tablet TAKE ONE EVERY DAY AS DIRECTED 90 tablet 1   No current facility-administered medications for this visit.    Discontinued Meds:   There are no discontinued medications.  Patient Active Problem List   Diagnosis Date Noted  . Arthritis 06/23/2013  . Atrial fibrillation with RVR 08/30/2012  . Obesity, unspecified  08/30/2012  . Hypokalemia 08/30/2012  . Long term current use of anticoagulant therapy 08/03/2012  . OSA (obstructive sleep apnea) 05/23/2012  . History of pulmonary embolism 12/28/2011  . Tobacco abuse 12/28/2011  . Chronic anxiety 12/28/2011  . GERD (gastroesophageal reflux disease) 12/28/2011  . Warfarin anticoagulation 12/28/2011    LABS    Component Value Date/Time   NA 142 07/24/2014 1003   NA 143 08/30/2012 0332   NA 141 05/12/2012 0355   NA 137 05/11/2012 1138   K 4.2 07/24/2014 1003   K 3.4* 08/30/2012 0332   K 4.0 05/12/2012 0355   CL 102 07/24/2014 1003   CL 105 08/30/2012 0332   CL 103 05/12/2012 0355   CO2 23 07/24/2014 1003   CO2 27 08/30/2012 0332   CO2 32 05/12/2012 0355   GLUCOSE 90 07/24/2014 1003   GLUCOSE 117* 08/30/2012 0332   GLUCOSE 107* 05/12/2012 0355   GLUCOSE 105* 05/11/2012 1138   BUN 10 07/24/2014 1003   BUN 13 08/30/2012 0332   BUN 7 05/12/2012 0355   BUN 9 05/11/2012 1138   CREATININE 1.02* 07/24/2014 1003   CREATININE 1.00 08/30/2012 0332   CREATININE 1.02 05/12/2012 0355   CALCIUM 9.2 07/24/2014 1003   CALCIUM 9.7 08/30/2012 0332  CALCIUM 8.9 05/12/2012 0355   GFRNONAA 59* 07/24/2014 1003   GFRNONAA 60* 08/30/2012 0332   GFRNONAA 58* 05/12/2012 0355   GFRAA 68 07/24/2014 1003   GFRAA 69* 08/30/2012 0332   GFRAA 67* 05/12/2012 0355   CMP     Component Value Date/Time   NA 142 07/24/2014 1003   NA 143 08/30/2012 0332   K 4.2 07/24/2014 1003   CL 102 07/24/2014 1003   CO2 23 07/24/2014 1003   GLUCOSE 90 07/24/2014 1003   GLUCOSE 117* 08/30/2012 0332   BUN 10 07/24/2014 1003   BUN 13 08/30/2012 0332   CREATININE 1.02* 07/24/2014 1003   CALCIUM 9.2 07/24/2014 1003   PROT 7.0 07/24/2014 1003   PROT 7.4 08/30/2012 0332   ALBUMIN 4.2 08/30/2012 0332   AST 18 07/24/2014 1003   ALT 18 07/24/2014 1003   ALKPHOS 112 07/24/2014 1003   BILITOT 0.3 07/24/2014 1003   BILITOT 0.3 08/30/2012 0332   GFRNONAA 59* 07/24/2014 1003    GFRAA 68 07/24/2014 1003       Component Value Date/Time   WBC 6.9 08/30/2012 0332   WBC 6.5 05/12/2012 0355   WBC 5.2 05/11/2012 1138   HGB 15.7* 08/30/2012 0332   HGB 13.9 05/12/2012 0355   HGB 14.7 05/11/2012 1138   HCT 45.4 08/30/2012 0332   HCT 42.3 05/12/2012 0355   HCT 43.9 05/11/2012 1138   MCV 92.8 08/30/2012 0332   MCV 95.5 05/12/2012 0355   MCV 94.4 05/11/2012 1138    Lipid Panel     Component Value Date/Time   CHOL 183 07/24/2014 1003   TRIG 138 07/24/2014 1003   HDL 61 07/24/2014 1003   CHOLHDL 3.0 07/24/2014 1003   LDLCALC 94 07/24/2014 1003    ABG No results found for: PHART, PCO2ART, PO2ART, HCO3, TCO2, ACIDBASEDEF, O2SAT   Lab Results  Component Value Date   TSH 1.900 07/24/2014   BNP (last 3 results) No results for input(s): BNP in the last 8760 hours.  ProBNP (last 3 results) No results for input(s): PROBNP in the last 8760 hours.  Cardiac Panel (last 3 results) No results for input(s): CKTOTAL, CKMB, TROPONINI, RELINDX in the last 72 hours.  Iron/TIBC/Ferritin/ %Sat No results found for: IRON, TIBC, FERRITIN, IRONPCTSAT   EKG Orders placed or performed in visit on 10/19/14  . EKG 12-Lead     Prior Assessment and Plan Problem List as of 10/19/2014      Cardiovascular and Mediastinum   Atrial fibrillation with RVR   Last Assessment & Plan 03/11/2013 Office Visit Written 03/11/2013  2:37 PM by Lendon Colonel, NP    Continues in NSR without complaints of palpitations. Continue diltiazem as directed.         Respiratory   OSA (obstructive sleep apnea)   Last Assessment & Plan 05/23/2012 Office Visit Written 05/23/2012  3:16 PM by Lendon Colonel, NP    Recently diagnosed by Dr. Wolfgang Phoenix. She is to have CPAP fitted. Will watch BP once she is routinely using device for need to change dosing if she becomes hypotensive. Repeat echo in one year.        Digestive   GERD (gastroesophageal reflux disease)     Musculoskeletal and  Integument   Arthritis     Other   History of pulmonary embolism   Last Assessment & Plan 03/11/2013 Office Visit Written 03/11/2013  2:36 PM by Lendon Colonel, NP    Continues on coumadin. No changes to medication  regimen.  Follow INR and coumadin dosing.      Tobacco abuse   Last Assessment & Plan 03/11/2013 Office Visit Written 03/11/2013  2:38 PM by Lendon Colonel, NP    Smoking cessation is strongly recommended.       Warfarin anticoagulation   Last Assessment & Plan 09/17/2012 Office Visit Written 09/17/2012 11:44 AM by Lendon Colonel, NP    Continue treatment regimen.      Chronic anxiety   Long term current use of anticoagulant therapy   Obesity, unspecified   Hypokalemia       Imaging: No results found.

## 2014-10-19 NOTE — Progress Notes (Signed)
Cardiology Office Note   Date:  10/19/2014   ID:  Sarah [name], DOB 1951/01/01, MRN [id number]  PCP:  Mickie Hillier, MD  Cardiologist:  To be est/ Jory Sims, NP   Chief Complaint  Patient presents with  . Atrial Fibrillation      History of Present Illness: Sarah [name] is a 64 y.o. female who presents for assessment and management of atrial flutter ablation, former patient of Dr. Jae Dire VASC Score of 2 (female, hypertension). Presents after not being seen since November of 2014.  Other history includes obesity,hypertension, obstructive sleep apnea, tobacco abuse.  She without any cardiac complaints.  She has not been hospitalized, had any surgeries or ER visits.  She was seen by her primary care physician, who was reluctant to refill flecainide until seen by cardiology. She denies any rapid heart rhythm, skipping, shortness of breath or chest pain.  She is followed by Sarah [name] for Coumadin dose, saying and management.   Past Medical History  Diagnosis Date  . DVT (deep venous thrombosis) age 48  . GERD (gastroesophageal reflux disease)   . Arthritis   . Lower extremity venous stasis     LLE, chronic  . Pulmonary embolism   . Spinal stenosis   . Chronic back pain   . Warfarin anticoagulation   . Chronic anxiety     Patient describes as stress  . Tobacco abuse   . Atrial fibrillation   . Hypoestrogenism   . Headache(784.0)   . Venous stasis   . Reflux   . Sleep apnea     Past Surgical History  Procedure Laterality Date  . Ankle surgery      x2  . Cholecystectomy    . Carpal tunnel release    . Ovarian cyst removal    . Rotator cuff repair    . Tubal ligation    . Appendectomy       Current Outpatient Prescriptions  Medication Sig Dispense Refill  . buPROPion (WELLBUTRIN SR) 150 MG 12 hr tablet TAKE 1 TABLET TWICE DAILY 180 tablet 1  . calcium carbonate (TUMS - DOSED IN MG ELEMENTAL CALCIUM) 500 MG chewable tablet Chew 1 tablet by mouth 3  (three) times daily as needed for heartburn.    . diltiazem (CARDIZEM CD) 180 MG 24 hr capsule TAKE ONE CAPSULE BY MOUTH EVERY DAY 90 capsule 1  . flecainide (TAMBOCOR) 50 MG tablet TAKE 1 TABLET (50 MG TOTAL) BY MOUTH 2 (TWO) TIMES DAILY. 60 tablet 0  . HYDROcodone-acetaminophen (NORCO/VICODIN) 5-325 MG per tablet Take 1 tablet by mouth 2 (two) times daily as needed for moderate pain. 60 tablet 0  . metoprolol (LOPRESSOR) 25 MG tablet 1 tablet as needed for heart racing 10 tablet 0  . pantoprazole (PROTONIX) 40 MG tablet TAKE 1 TABLET EVERY MORNING 90 tablet 1  . warfarin (COUMADIN) 5 MG tablet TAKE ONE EVERY DAY AS DIRECTED 90 tablet 1   No current facility-administered medications for this visit.    Allergies:   Aspirin    Social History:  The patient  reports that she has been smoking Cigarettes.  She has a 40 pack-year smoking history. She does not have any smokeless tobacco history on file. She reports that she does not drink alcohol or use illicit drugs.   Family History:  The patient's family history includes Arrhythmia in her mother; Cancer in her mother; Heart disease in her mother; Stroke in her father.    ROS: .   All other  systems are reviewed and negative.Unless otherwise mentioned in H&P above.   PHYSICAL EXAM: VS:  There were no vitals taken for this visit. , BMI There is no weight on file to calculate BMI. GEN: Well nourished, well developed, in no acute distress HEENT: normal Neck: no JVD, carotid bruits, or masses Cardiac: RRR; no murmurs, rubs, or gallops,no edema  Respiratory:  clear to auscultation bilaterally, normal work of breathing GI: soft, nontender, nondistended, + BS MS: no deformity or atrophy Skin: warm and dry, no rash Neuro:  Strength and sensation are intact Psych: euthymic mood, full affect   EKG:  The ekg ordered today demonstrates NSR, rate of 63 bpm. QT interval 414 ms, QTc 419; PR 184.    Recent Labs: 07/24/2014: ALT 18; BUN 10;  Creatinine, Ser 1.02*; Potassium 4.2; Sodium 142; TSH 1.900    Lipid Panel    Component Value Date/Time   CHOL 183 07/24/2014 1003   TRIG 138 07/24/2014 1003   HDL 61 07/24/2014 1003   CHOLHDL 3.0 07/24/2014 1003   LDLCALC 94 07/24/2014 1003      Wt Readings from Last 3 Encounters:  09/25/14 270 lb (122.471 kg)  07/24/14 274 lb 12.8 oz (124.648 kg)  06/25/14 281 lb (127.461 kg)      Other studies Reviewed: Additional studies/ records that were reviewed today include: None. Review of the above records demonstrates: N/A   ASSESSMENT AND PLAN:  1. Atrial fibrillation: The patient's heart rate is well-controlled.  She takes diltiazem 180 mg daily, flecainide 50 mg by mouth twice a day.  Her Toprol 25 mg when necessary rapid heart rhythm, and remains on Coumadin.  CHADS VASC score of 2. She is given refills on flecainide.  We will see her again in one year.  2. Hypertension: blood pressure is well-controlled.  No changes on medications.    Current medicines are reviewed at length with the patient today.    Labs/ tests ordered today include: None No orders of the defined types were placed in this encounter.     Disposition:   FU with 1 year unless symptomatic. Will NEED TO BE ESTABLISHED!  Signed, Jory Sims, NP  10/19/2014 7:30 AM    [facility] [address] [place], [place] [address] Phone: [phone]; Fax: [phone]

## 2014-10-19 NOTE — Patient Instructions (Signed)
Your physician wants you to follow-up in: 1 Year. You will receive a reminder letter in the mail two months in advance. If you don't receive a letter, please call our office to schedule the follow-up appointment.  Your physician recommends that you continue on your current medications as directed. Please refer to the Current Medication list given to you today.  Thank you for choosing [facility]!

## 2014-10-24 ENCOUNTER — Other Ambulatory Visit: Payer: Self-pay | Admitting: Family Medicine

## 2014-10-26 ENCOUNTER — Other Ambulatory Visit: Payer: Self-pay | Admitting: Family Medicine

## 2014-10-27 ENCOUNTER — Ambulatory Visit (INDEPENDENT_AMBULATORY_CARE_PROVIDER_SITE_OTHER): Payer: Medicare Other

## 2014-10-27 DIAGNOSIS — Z7901 Long term (current) use of anticoagulants: Secondary | ICD-10-CM | POA: Diagnosis not present

## 2014-10-27 LAB — POCT INR: INR: 2.5

## 2014-11-09 ENCOUNTER — Other Ambulatory Visit: Payer: Self-pay | Admitting: Family Medicine

## 2014-11-24 ENCOUNTER — Ambulatory Visit (INDEPENDENT_AMBULATORY_CARE_PROVIDER_SITE_OTHER): Payer: Medicare Other | Admitting: *Deleted

## 2014-11-24 DIAGNOSIS — Z7901 Long term (current) use of anticoagulants: Secondary | ICD-10-CM | POA: Diagnosis not present

## 2014-11-24 LAB — POCT INR: INR: 2.6

## 2014-12-28 ENCOUNTER — Ambulatory Visit (INDEPENDENT_AMBULATORY_CARE_PROVIDER_SITE_OTHER): Payer: Medicare Other | Admitting: Family Medicine

## 2014-12-28 ENCOUNTER — Encounter: Payer: Self-pay | Admitting: Family Medicine

## 2014-12-28 VITALS — BP 128/70 | Ht 65.0 in | Wt 270.0 lb

## 2014-12-28 DIAGNOSIS — Z7901 Long term (current) use of anticoagulants: Secondary | ICD-10-CM

## 2014-12-28 LAB — POCT INR: INR: 3

## 2014-12-28 MED ORDER — HYDROCODONE-ACETAMINOPHEN 5-325 MG PO TABS: 1.0000 | ORAL_TABLET | Freq: Two times a day (BID) | ORAL | Status: DC | PRN

## 2014-12-28 NOTE — Patient Instructions (Signed)
Coumadin '5mg'$  take one half tablet every day except on Wednesdays and saturdays take one whole tablet. Recheck in 4 weeks

## 2014-12-28 NOTE — Progress Notes (Signed)
   Subjective:    Patient ID: Sarah [name], female    DOB: 1951/02/13, 63 y.o.   MRN: [id number]  HPI This patient was seen today for chronic pain  The medication list was reviewed and updated.   -Compliance with pain medication: takes as prescribed. Takes for back pain. Med helps somewhat.   The patient was advised the importance of maintaining medication and not using illegal substances with these.  Refills needed: yes  The patient was educated that we can provide 3 monthly scripts for their medication, it is their responsibility to follow the instructions.  Side effects or complications from medications: none  Patient is aware that pain medications are meant to minimize the severity of the pain to allow their pain levels to improve to allow for better function. They are aware of that pain medications cannot totally remove their pain.  Due for UDT ( at least once per year) : was done at last visit.   INR 3.0.   Constant bk pain, no constip as far as meds  Taking   INR at 3.0 , holding its own,  Patient compliant with medication for atrial fibrillation and blood pressure. Overall handling it well. Watching her salt intake. Also sees a cardiologist.  Compliant with reflux medicine. No obvious side effects. Expect  States depression overall is stable  Review of Systems No headache no chest pain positive chronic back pain no change in bowel habits no blood in stool    Objective:   Physical Exam  Alert vitals stable. Lungs clear. Heart rhythm under control ankles without edema  C INR results and recommendations    Assessment & Plan:  Impression 1 chronic pain discussed #2 hypertension good control #3 atrial fibrillation these control #4 depression stable #5 anticoagulation Coumadin assess and adjusted plan as per orders. Diet exercise discussed. Pain medications written out. Recheck in several months. WSL

## 2015-01-28 ENCOUNTER — Ambulatory Visit (INDEPENDENT_AMBULATORY_CARE_PROVIDER_SITE_OTHER): Payer: Medicare Other | Admitting: *Deleted

## 2015-01-28 DIAGNOSIS — Z7901 Long term (current) use of anticoagulants: Secondary | ICD-10-CM

## 2015-01-28 DIAGNOSIS — Z23 Encounter for immunization: Secondary | ICD-10-CM

## 2015-01-28 LAB — POCT INR: INR: 2.4

## 2015-02-25 ENCOUNTER — Ambulatory Visit (INDEPENDENT_AMBULATORY_CARE_PROVIDER_SITE_OTHER): Payer: Medicare Other | Admitting: *Deleted

## 2015-02-25 DIAGNOSIS — Z7901 Long term (current) use of anticoagulants: Secondary | ICD-10-CM | POA: Diagnosis not present

## 2015-02-25 LAB — POCT INR: INR: 2.7

## 2015-03-06 ENCOUNTER — Other Ambulatory Visit: Payer: Self-pay | Admitting: Family Medicine

## 2015-03-23 ENCOUNTER — Encounter: Payer: Self-pay | Admitting: Family Medicine

## 2015-03-23 ENCOUNTER — Ambulatory Visit (INDEPENDENT_AMBULATORY_CARE_PROVIDER_SITE_OTHER): Payer: Medicare Other | Admitting: Family Medicine

## 2015-03-23 VITALS — BP 126/74 | Ht 65.0 in

## 2015-03-23 DIAGNOSIS — M1990 Unspecified osteoarthritis, unspecified site: Secondary | ICD-10-CM | POA: Diagnosis not present

## 2015-03-23 DIAGNOSIS — Z7901 Long term (current) use of anticoagulants: Secondary | ICD-10-CM

## 2015-03-23 DIAGNOSIS — K219 Gastro-esophageal reflux disease without esophagitis: Secondary | ICD-10-CM | POA: Diagnosis not present

## 2015-03-23 DIAGNOSIS — G4733 Obstructive sleep apnea (adult) (pediatric): Secondary | ICD-10-CM

## 2015-03-23 DIAGNOSIS — E785 Hyperlipidemia, unspecified: Secondary | ICD-10-CM

## 2015-03-23 DIAGNOSIS — I4891 Unspecified atrial fibrillation: Secondary | ICD-10-CM

## 2015-03-23 LAB — POCT INR: INR: 3.2

## 2015-03-23 MED ORDER — HYDROCODONE-ACETAMINOPHEN 5-325 MG PO TABS: 1.0000 | ORAL_TABLET | Freq: Two times a day (BID) | ORAL | Status: DC | PRN

## 2015-03-23 NOTE — Patient Instructions (Signed)
Coumadin '5mg'$ . Take one half tablet every day except on Wednesday and Saturday take one whole tablet. Recheck in 4 weeks.

## 2015-03-23 NOTE — Progress Notes (Signed)
   Subjective:    Patient ID: NUR RABOLD, female    DOB: 14-Feb-1951, 64 y.o.   MRN: [id number]  Atrial Fibrillation Presents for follow-up visit. Past medical history includes atrial fibrillation.  INR today 3.2.  Pt states no concerns or problems today.  No headache no chest pain  Exercise going ok, not much   cks b p elsewhere  hydrocod helps some, but not a lot  Patient arrives office stating back continues to her. Hydrocodone helped some but not a lot.   Compliant with medication for atrial fibrillation. Generally does not miss a dose heart rates generally regular some exercise intolerance with this.  Ongoing challenges with reflux compliant with medication also states needs medicine without medicine  Patient notes complete compliance with INR medicine  Review of Systems No headache no chest pain chronic back pain no abdominal pain no change in bowel habits    Objective:   Physical Exam Alert vitals stable HEENT normal lungs clear. Heart regular rate and rhythm. Low back tender to palpation ankles mild bilateral edema knees substantial crepitations       Assessment & Plan:  Impression 1 chronic anticoagulation 3.2 INR slightly elevated discuss will maintain same #2 venous stasis clinically stable #3 arthritis with severe pain ongoing. Needs chronic meds meds refilled. #4 atrial fibrillation clinically stable sticking with meds plan 25 minutes spent most in discussion pain meds written. Other medications refilled. Diet exercise discussed WSL

## 2015-04-19 ENCOUNTER — Other Ambulatory Visit: Payer: Self-pay | Admitting: Family Medicine

## 2015-04-20 ENCOUNTER — Ambulatory Visit (INDEPENDENT_AMBULATORY_CARE_PROVIDER_SITE_OTHER): Payer: Medicare Other

## 2015-04-20 DIAGNOSIS — Z7901 Long term (current) use of anticoagulants: Secondary | ICD-10-CM

## 2015-04-20 LAB — POCT INR: INR: 2.3

## 2015-05-06 ENCOUNTER — Telehealth: Payer: Self-pay | Admitting: Family Medicine

## 2015-05-06 MED ORDER — METOPROLOL TARTRATE 25 MG PO TABS: ORAL_TABLET | ORAL | Status: DC

## 2015-05-06 NOTE — Telephone Encounter (Signed)
Patient notified that refill was sent to pharmacy.

## 2015-05-06 NOTE — Telephone Encounter (Signed)
Ok 6 mo

## 2015-05-06 NOTE — Telephone Encounter (Signed)
metoprolol (LOPRESSOR) 25 MG tablet  Pt would like a refill on this med please, [facility]

## 2015-05-18 ENCOUNTER — Ambulatory Visit (INDEPENDENT_AMBULATORY_CARE_PROVIDER_SITE_OTHER): Payer: Medicare Other

## 2015-05-18 DIAGNOSIS — Z7901 Long term (current) use of anticoagulants: Secondary | ICD-10-CM

## 2015-05-18 LAB — POCT INR: INR: 2

## 2015-06-15 ENCOUNTER — Ambulatory Visit (INDEPENDENT_AMBULATORY_CARE_PROVIDER_SITE_OTHER): Payer: Medicare Other | Admitting: Family Medicine

## 2015-06-15 ENCOUNTER — Encounter: Payer: Self-pay | Admitting: Family Medicine

## 2015-06-15 VITALS — BP 132/80 | Ht 65.0 in | Wt 281.0 lb

## 2015-06-15 DIAGNOSIS — M1990 Unspecified osteoarthritis, unspecified site: Secondary | ICD-10-CM | POA: Diagnosis not present

## 2015-06-15 DIAGNOSIS — F419 Anxiety disorder, unspecified: Secondary | ICD-10-CM

## 2015-06-15 DIAGNOSIS — I4891 Unspecified atrial fibrillation: Secondary | ICD-10-CM | POA: Diagnosis not present

## 2015-06-15 DIAGNOSIS — Z7901 Long term (current) use of anticoagulants: Secondary | ICD-10-CM

## 2015-06-15 LAB — POCT INR: INR: 2.2

## 2015-06-15 MED ORDER — HYDROCODONE-ACETAMINOPHEN 5-325 MG PO TABS: 1.0000 | ORAL_TABLET | Freq: Two times a day (BID) | ORAL | Status: DC | PRN

## 2015-06-15 NOTE — Progress Notes (Signed)
   Subjective:    Patient ID: Sarah [name], female    DOB: May 23, 1950, 65 y.o.   MRN: [id number]  HPI This patient was seen today for chronic pain  The medication list was reviewed and updated.   -Compliance with medication: yes  - Number patient states they take daily: two a day  -when was the last dose patient took? yesterday  The patient was advised the importance of maintaining medication and not using illegal substances with these.  Refills needed: yes  The patient was educated that we can provide 3 monthly scripts for their medication, it is their responsibility to follow the instructions.  Side effects or complications from medications: none  Patient is aware that pain medications are meant to minimize the severity of the pain to allow their pain levels to improve to allow for better function. They are aware of that pain medications cannot totally remove their pain.  Due for UDT ( at least once per year) : May 2016  INR today 2.2. Patient claims compliance with Coumadin medicine review today. No obvious side effects. Does not miss a dose.  Compliant with antidepressant medicine. No obvious side effects. States overall still helping her mood. Next  Not exercising much   Low back pain tends to radiate into right leg   Review of Systems No headache no chest pain no abdominal pain chronic back pain no change in bowel habits ROS otherwise negative    Objective:   Physical Exam Alert no apparent distress. Lungs clear. Heart rare irregular but controlledrhythm H&T normal negative straight leg raise. Low back tender to palpation       Assessment & Plan:  Impression 1H Earl fibrillation with good control. #2 chronic anticoagulation coming good dosing at this current INR discussed #3 depression clinically stable #4 chronic pain with ongoing need for meds discussed and refilledplan all meds refilled diet exercise discussed recheck in several months encouraged to stop  smoking WSL

## 2015-06-15 NOTE — Patient Instructions (Signed)
Coumadin '5mg'$ . Take one half tablet every day except on wed and sat take one whole tablet. Recheck INR in  Weeks.

## 2015-06-18 ENCOUNTER — Other Ambulatory Visit: Payer: Self-pay | Admitting: Family Medicine

## 2015-07-13 ENCOUNTER — Ambulatory Visit (INDEPENDENT_AMBULATORY_CARE_PROVIDER_SITE_OTHER): Payer: Medicare Other

## 2015-07-13 DIAGNOSIS — Z7901 Long term (current) use of anticoagulants: Secondary | ICD-10-CM

## 2015-07-13 LAB — POCT INR: INR: 2.8

## 2015-07-23 DIAGNOSIS — H43393 Other vitreous opacities, bilateral: Secondary | ICD-10-CM | POA: Diagnosis not present

## 2015-07-23 DIAGNOSIS — H16223 Keratoconjunctivitis sicca, not specified as Sjogren's, bilateral: Secondary | ICD-10-CM | POA: Diagnosis not present

## 2015-07-23 DIAGNOSIS — H2513 Age-related nuclear cataract, bilateral: Secondary | ICD-10-CM | POA: Diagnosis not present

## 2015-08-11 ENCOUNTER — Ambulatory Visit (INDEPENDENT_AMBULATORY_CARE_PROVIDER_SITE_OTHER): Payer: Medicare Other | Admitting: *Deleted

## 2015-08-11 DIAGNOSIS — Z7901 Long term (current) use of anticoagulants: Secondary | ICD-10-CM | POA: Diagnosis not present

## 2015-08-11 LAB — POCT INR: INR: 2.3

## 2015-08-18 ENCOUNTER — Other Ambulatory Visit: Payer: Self-pay | Admitting: Family Medicine

## 2015-08-24 ENCOUNTER — Encounter: Payer: Self-pay | Admitting: Cardiology

## 2015-08-27 ENCOUNTER — Encounter: Payer: Self-pay | Admitting: Family Medicine

## 2015-08-27 ENCOUNTER — Ambulatory Visit (INDEPENDENT_AMBULATORY_CARE_PROVIDER_SITE_OTHER): Payer: Medicare Other | Admitting: Family Medicine

## 2015-08-27 VITALS — BP 130/82 | Temp 98.2°F | Ht 65.0 in | Wt 281.0 lb

## 2015-08-27 DIAGNOSIS — H9201 Otalgia, right ear: Secondary | ICD-10-CM | POA: Diagnosis not present

## 2015-08-27 DIAGNOSIS — R6884 Jaw pain: Secondary | ICD-10-CM

## 2015-08-27 MED ORDER — HYDROCODONE-ACETAMINOPHEN 10-325 MG PO TABS: 1.0000 | ORAL_TABLET | ORAL | Status: DC | PRN

## 2015-08-27 NOTE — Progress Notes (Signed)
   Subjective:    Patient ID: Sarah [name], female    DOB: 1950-11-03, 65 y.o.   MRN: [id number]  Otalgia  There is pain in the right ear. This is a new problem. The current episode started in the past 7 days. Associated symptoms include headaches and a sore throat. Associated symptoms comments: fever.  Pate patient relates ear pain on the right side and upper jaw pain she wonders if it could be a tooth she denies high fever chills sweats she denies nausea vomiting she does relate some low-grade fever.    Review of Systems  HENT: Positive for ear pain and sore throat.   Neurological: Positive for headaches.       Objective:   Physical Exam  Throat appears normal neck no masses eardrums normal sinus nontender lungs are clear hearts irregular but rate control      Assessment & Plan:  Right ear pain possible tooth pain I don't find any evidence of bacterial component with her being on Coumadin I do not 1 a put on antibiotics currently I would recommend that we go ahead with a higher dose of pain medication for the weekend she will see the dentist on Monday she will report to [place] if the dentist does not find a reason for her jaw and ear pain then she will need to have some further testing done patient will call us back on Monday

## 2015-09-10 ENCOUNTER — Encounter: Payer: Self-pay | Admitting: Family Medicine

## 2015-09-10 ENCOUNTER — Ambulatory Visit (INDEPENDENT_AMBULATORY_CARE_PROVIDER_SITE_OTHER): Payer: Medicare Other | Admitting: Family Medicine

## 2015-09-10 VITALS — BP 128/70 | Ht 65.0 in | Wt 274.0 lb

## 2015-09-10 DIAGNOSIS — K219 Gastro-esophageal reflux disease without esophagitis: Secondary | ICD-10-CM

## 2015-09-10 DIAGNOSIS — I4891 Unspecified atrial fibrillation: Secondary | ICD-10-CM

## 2015-09-10 DIAGNOSIS — Z7901 Long term (current) use of anticoagulants: Secondary | ICD-10-CM

## 2015-09-10 DIAGNOSIS — F419 Anxiety disorder, unspecified: Secondary | ICD-10-CM | POA: Diagnosis not present

## 2015-09-10 LAB — POCT INR: INR: 2.3

## 2015-09-10 MED ORDER — HYDROCODONE-ACETAMINOPHEN 5-325 MG PO TABS: 1.0000 | ORAL_TABLET | Freq: Two times a day (BID) | ORAL | Status: DC | PRN

## 2015-09-10 NOTE — Patient Instructions (Signed)
Coumadin '5mg'$  tablets. Take one half tablet every day except on Wednesday and saturdays take one whole tablet. Recheck in 6 weeks.

## 2015-09-10 NOTE — Progress Notes (Signed)
   Subjective:    Patient ID: Sarah [name], female    DOB: 12-Nov-1950, 65 y.o.   MRN: [id number]  HPI This patient was seen today for chronic pain. Takes for back pain.   The medication list was reviewed and updated.   -Compliance with medication: yes  - Number patient states they take daily: one bid  -when was the last dose patient took? Yesterday  The patient was advised the importance of maintaining medication and not using illegal substances with these.  Refills needed: yes  The patient was educated that we can provide 3 monthly scripts for their medication, it is their responsibility to follow the instructions.  Side effects or complications from medications: none  Patient is aware that pain medications are meant to minimize the severity of the pain to allow their pain levels to improve to allow for better function. They are aware of that pain medications cannot totally remove their pain.  Due for UDT ( at least once per year) : done 09/25/14  INR 2.3. Patient states compliant with Coumadin. Does not miss a dose. He generally 1 maintain this level. No excessive bleeding.  Atrial fibrillation medicine. Watching salt intake. Does not miss a dose no obvious side effects.    Reflux clinically stable states definitely needs her Protonix  Depression clinically stable no obvious side effects from meds        Review of Systems No headache, no major weight loss or weight gain, no chest pain no back pain abdominal pain no change in bowel habits complete ROS otherwise negative     Objective:   Physical Exam  Alert vital stable blood pressure excellent on repeat HEENT normal lungs clear heart rare rhythm low back tender to percussion abdomen benign  INR analyzed. Recheck      Assessment & Plan:  Impression 1 chronic pain with definitely needs from on meds no excessive side effects 2 atrial fibrillation good control maintain same meds meds reviewed anticoagulation  level excellent plan recheck in 6 weeks for INR start checking every 6 weeks since numbers so stable. Pain medication refill. Office visit in diet exercise discussed WSL

## 2015-10-14 ENCOUNTER — Encounter: Payer: Self-pay | Admitting: Cardiology

## 2015-10-14 ENCOUNTER — Other Ambulatory Visit: Payer: Self-pay | Admitting: Family Medicine

## 2015-10-14 ENCOUNTER — Ambulatory Visit (INDEPENDENT_AMBULATORY_CARE_PROVIDER_SITE_OTHER): Payer: Medicare Other | Admitting: Cardiology

## 2015-10-14 VITALS — BP 128/58 | HR 82 | Ht 67.0 in | Wt 268.0 lb

## 2015-10-14 DIAGNOSIS — Z86711 Personal history of pulmonary embolism: Secondary | ICD-10-CM

## 2015-10-14 DIAGNOSIS — I480 Paroxysmal atrial fibrillation: Secondary | ICD-10-CM | POA: Diagnosis not present

## 2015-10-14 MED ORDER — FLECAINIDE ACETATE 50 MG PO TABS: ORAL_TABLET | ORAL | Status: DC

## 2015-10-14 NOTE — Patient Instructions (Signed)
Your physician wants you to follow-up in: 1 year with Dr McDowell You will receive a reminder letter in the mail two months in advance. If you don't receive a letter, please call our office to schedule the follow-up appointment.    Your physician recommends that you continue on your current medications as directed. Please refer to the Current Medication list given to you today.     If you need a refill on your cardiac medications before your next appointment, please call your pharmacy.     Thank you for choosing [facility] !        

## 2015-10-14 NOTE — Progress Notes (Signed)
Cardiology Office Note  Date: 10/14/2015   ID: Sarah [name], DOB 1950-05-15, MRN [id number]  PCP: Mickie Hillier, MD  Primary Cardiologist: Rozann Lesches, MD   Chief Complaint  Patient presents with  . Atrial Fibrillation    History of Present Illness: Sarah [name] is a 65 y.o. female last seen by Ms. Lawrence NP in June 2016. She is a former patient of Dr. Lattie Haw, this is my first meeting with her in the office. I reviewed her records and updated the chart. She has a history of PAF that has been well controlled over time on flecainide, Cardizem CD, and Coumadin. She states that she had a brief episode around the end of last year that resolved after a few hours. She has never required an electrical cardioversion by her recollection.  She is on Coumadin, followed by Dr. Wolfgang Phoenix. History includes prior diagnosis of pulmonary embolus and also paroxysmal atrial fibrillation. She does not report any unusual bleeding problems.  I reviewed her ECG today which shows normal sinus rhythm with QRS duration 96 ms and normal QTC.  We discussed her medications. She reports compliance. Metoprolol is only used for breakthrough palpitations.  Past Medical History  Diagnosis Date  . DVT (deep venous thrombosis) ([place]) age 69  . GERD (gastroesophageal reflux disease)   . Arthritis   . Lower extremity venous stasis     LLE, chronic  . Pulmonary embolism ([place])   . Spinal stenosis   . Chronic back pain   . Warfarin anticoagulation   . Chronic anxiety     Patient describes as stress  . Tobacco abuse   . Atrial fibrillation ([place])   . Hypoestrogenism   . Headache(784.0)   . Venous stasis   . Reflux   . Sleep apnea     Current Outpatient Prescriptions  Medication Sig Dispense Refill  . buPROPion (WELLBUTRIN SR) 150 MG 12 hr tablet TAKE 1 TABLET TWICE DAILY 180 tablet 0  . calcium carbonate (TUMS - DOSED IN MG ELEMENTAL CALCIUM) 500 MG chewable tablet Chew 1 tablet by mouth 3 (three)  times daily as needed for heartburn.    . diltiazem (CARDIZEM CD) 180 MG 24 hr capsule TAKE ONE CAPSULE BY MOUTH EVERY DAY 90 capsule 1  . flecainide (TAMBOCOR) 50 MG tablet TAKE 1 TABLET (50 MG TOTAL) BY MOUTH 2 (TWO) TIMES DAILY. 60 tablet 11  . HYDROcodone-acetaminophen (NORCO/VICODIN) 5-325 MG tablet Take 1 tablet by mouth 2 (two) times daily as needed for moderate pain. 60 tablet 0  . metoprolol tartrate (LOPRESSOR) 25 MG tablet 1 tablet as needed for heart racing 10 tablet 5  . pantoprazole (PROTONIX) 40 MG tablet TAKE 1 TABLET EVERY MORNING 90 tablet 1  . warfarin (COUMADIN) 5 MG tablet TAKE 1 TABLET BY MOUTH EVERY DAY AS DIRECTED 90 tablet 1   No current facility-administered medications for this visit.   Allergies:  Aspirin   Social History: The patient  reports that she has been smoking Cigarettes.  She has a 40 pack-year smoking history. She does not have any smokeless tobacco history on file. She reports that she does not drink alcohol or use illicit drugs.   ROS:  Please see the history of present illness. Otherwise, complete review of systems is positive for arthritic symptoms.  All other systems are reviewed and negative.   Physical Exam: VS:  BP 128/58 mmHg  Pulse 82  Ht '5\' 7"'$  (1.702 m)  Wt 268 lb (121.564 kg)  BMI  41.96 kg/m2  SpO2 91%, BMI Body mass index is 41.96 kg/(m^2).  Wt Readings from Last 3 Encounters:  10/14/15 268 lb (121.564 kg)  09/10/15 274 lb (124.286 kg)  08/27/15 281 lb (127.461 kg)    General: Morbid obesity obese woman, appears comfortable at rest. HEENT: Conjunctiva and lids normal, oropharynx clear. Neck: Supple, no elevated JVP or carotid bruits, no thyromegaly. Lungs: Clear to auscultation, nonlabored breathing at rest. Cardiac: Regular rate and rhythm, no S3 or significant systolic murmur, no pericardial rub. Abdomen: Soft, nontender, bowel sounds present, no guarding or rebound. Extremities: Trace ankle edema, distal pulses 2+. Skin: Warm  and dry. Musculoskeletal: No kyphosis. Neuropsychiatric: Alert and oriented x3, affect grossly appropriate.  ECG: I personally reviewed the tracing from 10/19/2014 which showed sinus rhythm with borderline low voltage, QRS duration 98 ms, normal QTC.  Recent Labwork:    Component Value Date/Time   CHOL 183 07/24/2014 1003   TRIG 138 07/24/2014 1003   HDL 61 07/24/2014 1003   CHOLHDL 3.0 07/24/2014 1003   LDLCALC 94 07/24/2014 1003    Other Studies Reviewed Today:  Echocardiogram 12/29/2011: Study Conclusions  - Left ventricle: The cavity size was normal. Wall thickness was increased in a pattern of mild LVH. Systolic function was vigorous. The estimated ejection fraction was in the range of 75% to 80%. Wall motion was normal; there were no regional wall motion abnormalities. Doppler parameters are consistent with abnormal left ventricular relaxation (grade 1 diastolic dysfunction). - Mitral valve: Trivial regurgitation. - Left atrium: The atrium was mildly dilated. - Right ventricle: Hypertrophy was present. Systolic function was normal. - Tricuspid valve: Trivial regurgitation. - Pulmonary arteries: Systolic pressure could not be accurately estimated. - Pericardium, extracardiac: There was no pericardial effusion.  Assessment and Plan:  1. Paroxysmal atrial fibrillation, symptomatically well controlled on current regimen as outlined above. ECG reviewed and stable. She follows with Dr. Wolfgang Phoenix regularly, recommend follow-up lab work this year including CBC and BMET. Otherwise will continue observation as before.  2. Prior history of pulmonary embolus and DVT. No obvious recurrences.  Current medicines were reviewed with the patient today.   Orders Placed This Encounter  Procedures  . EKG 12-Lead    Disposition: Follow-up with me in one year.  Signed, Satira Sark, MD, [facility] 10/14/2015 11:23 AM    [place] [facility] [address] [place], [place] [address] Phone: [phone]; Fax: [phone]

## 2015-10-19 ENCOUNTER — Ambulatory Visit: Payer: PRIVATE HEALTH INSURANCE | Admitting: Cardiology

## 2015-10-22 ENCOUNTER — Ambulatory Visit (INDEPENDENT_AMBULATORY_CARE_PROVIDER_SITE_OTHER): Payer: Medicare Other

## 2015-10-22 DIAGNOSIS — Z7901 Long term (current) use of anticoagulants: Secondary | ICD-10-CM | POA: Diagnosis not present

## 2015-10-22 LAB — POCT INR: INR: 3.3

## 2015-10-22 NOTE — Patient Instructions (Signed)
Take 1 whole tablet on Wednesday and 1/2 tablet all other days.

## 2015-11-19 ENCOUNTER — Ambulatory Visit (INDEPENDENT_AMBULATORY_CARE_PROVIDER_SITE_OTHER): Payer: Medicare Other

## 2015-11-19 DIAGNOSIS — Z5181 Encounter for therapeutic drug level monitoring: Secondary | ICD-10-CM

## 2015-11-19 DIAGNOSIS — Z7901 Long term (current) use of anticoagulants: Secondary | ICD-10-CM | POA: Diagnosis not present

## 2015-11-19 LAB — POCT INR: INR: 2.8

## 2015-11-19 NOTE — Patient Instructions (Signed)
Take 1/2 by mouth all days except Wednesday take 1 whole tablet by mouth.

## 2015-12-13 ENCOUNTER — Ambulatory Visit: Payer: Medicare Other | Admitting: Family Medicine

## 2015-12-15 ENCOUNTER — Encounter: Payer: Self-pay | Admitting: Family Medicine

## 2015-12-15 ENCOUNTER — Ambulatory Visit (INDEPENDENT_AMBULATORY_CARE_PROVIDER_SITE_OTHER): Payer: Medicare Other | Admitting: Family Medicine

## 2015-12-15 VITALS — BP 128/60 | Ht 67.0 in | Wt 266.5 lb

## 2015-12-15 DIAGNOSIS — K219 Gastro-esophageal reflux disease without esophagitis: Secondary | ICD-10-CM | POA: Diagnosis not present

## 2015-12-15 DIAGNOSIS — Z7901 Long term (current) use of anticoagulants: Secondary | ICD-10-CM

## 2015-12-15 DIAGNOSIS — F419 Anxiety disorder, unspecified: Secondary | ICD-10-CM

## 2015-12-15 DIAGNOSIS — M1990 Unspecified osteoarthritis, unspecified site: Secondary | ICD-10-CM | POA: Diagnosis not present

## 2015-12-15 DIAGNOSIS — Z79891 Long term (current) use of opiate analgesic: Secondary | ICD-10-CM

## 2015-12-15 DIAGNOSIS — E785 Hyperlipidemia, unspecified: Secondary | ICD-10-CM | POA: Diagnosis not present

## 2015-12-15 LAB — POCT INR: INR: 2.3

## 2015-12-15 MED ORDER — HYDROCODONE-ACETAMINOPHEN 5-325 MG PO TABS: 1.0000 | ORAL_TABLET | Freq: Two times a day (BID) | ORAL | 0 refills | Status: DC | PRN

## 2015-12-15 MED ORDER — SUCRALFATE 1 G PO TABS: 1.0000 g | ORAL_TABLET | Freq: Two times a day (BID) | ORAL | 1 refills | Status: DC

## 2015-12-15 NOTE — Patient Instructions (Signed)
Take 1 whole tablet on Wednesdays and Saturdays. Take 1/2 tablet on other days

## 2015-12-15 NOTE — Progress Notes (Signed)
   Subjective:    Patient ID: Sarah [name], female    DOB: 09-28-1950, 65 y.o.   MRN: [id number]  HPI This patient was seen today for chronic pain  The medication list was reviewed and updated.   -Compliance with medication: Yes  - Number patient states they take daily: Patient states takes 2 tablets daily.  -when was the last dose patient took? Last dose taken last night at 2100  The patient was advised the importance of maintaining medication and not using illegal substances with these.  Refills needed: Yes   The patient was educated that we can provide 3 monthly scripts for their medication, it is their responsibility to follow the instructions.  Side effects or complications from medications: Patient states no side effects.   Patient is aware that pain medications are meant to minimize the severity of the pain to allow their pain levels to improve to allow for better function. They are aware of that pain medications cannot totally remove their pain.  Due for UDT ( at least once per year) : Due today   Has concerns of acid reflux worsening. Metallic tast at night, wakes upo with sore throat, vomiting at night. usez protonix reg , eses it some. Compliant with Protonix flares come and go  Compliant with a chronic anxiety/depression medication. No obvious side effects. Does not miss a dose. States definitely still helps  Day symtoms, and feels churnbing in the abdomen   Pain is primarily in the back although somewhat in the joints. Next  Claims full compliance with Coumadin. Does not miss a dose no excess bleeding Worse in thr eeve   Results for orders placed or performed in visit on 12/15/15  POCT INR  Result Value Ref Range   INR 2.3    Not exercising as much , not   Review of Systems No headache, no major weight loss or weight gain, no chest pain no back pain abdominal pain no change in bowel habits complete ROS otherwise negative     Objective:   Physical  Exam Alert vitals stable lungs clear. Heart regular rate and rhythm. H&T normal. Abdomen benign low back positive tender to percussion.       Assessment & Plan:  Impression 1 chronic anticoagulation good control to maintain same #2 chronic pain with definite need for medications side effects benefits compliance discussed #3 depression/anxiety overall stable on meds maintain same #4 worsening reflux add Carafate would prefer not to increase Prilosec plan as per orders diet exercise discussed encouraged to stop smoking recheck in several months WSL

## 2015-12-17 ENCOUNTER — Ambulatory Visit: Payer: Medicare Other

## 2015-12-24 LAB — TOXASSURE SELECT 13 (MW), URINE: PDF: 0

## 2016-01-11 ENCOUNTER — Other Ambulatory Visit: Payer: Self-pay | Admitting: Family Medicine

## 2016-01-12 ENCOUNTER — Ambulatory Visit (INDEPENDENT_AMBULATORY_CARE_PROVIDER_SITE_OTHER): Payer: Medicare Other

## 2016-01-12 DIAGNOSIS — Z7901 Long term (current) use of anticoagulants: Secondary | ICD-10-CM

## 2016-01-12 LAB — POCT INR: INR: 2.5

## 2016-02-09 ENCOUNTER — Ambulatory Visit (INDEPENDENT_AMBULATORY_CARE_PROVIDER_SITE_OTHER): Payer: Medicare Other

## 2016-02-09 DIAGNOSIS — Z23 Encounter for immunization: Secondary | ICD-10-CM

## 2016-02-09 DIAGNOSIS — Z7901 Long term (current) use of anticoagulants: Secondary | ICD-10-CM | POA: Diagnosis not present

## 2016-02-09 LAB — POCT INR: INR: 2.3

## 2016-02-22 ENCOUNTER — Other Ambulatory Visit: Payer: Self-pay | Admitting: Family Medicine

## 2016-02-22 ENCOUNTER — Other Ambulatory Visit: Payer: Self-pay | Admitting: *Deleted

## 2016-02-22 MED ORDER — SUCRALFATE 1 G PO TABS: 1.0000 g | ORAL_TABLET | Freq: Two times a day (BID) | ORAL | 1 refills | Status: DC

## 2016-02-22 NOTE — Telephone Encounter (Signed)
Ok six mo worth 

## 2016-03-09 ENCOUNTER — Encounter: Payer: Self-pay | Admitting: Family Medicine

## 2016-03-09 ENCOUNTER — Ambulatory Visit (INDEPENDENT_AMBULATORY_CARE_PROVIDER_SITE_OTHER): Payer: Medicare Other | Admitting: Family Medicine

## 2016-03-09 VITALS — BP 128/70 | Ht 67.0 in | Wt 266.1 lb

## 2016-03-09 DIAGNOSIS — M1990 Unspecified osteoarthritis, unspecified site: Secondary | ICD-10-CM

## 2016-03-09 DIAGNOSIS — F419 Anxiety disorder, unspecified: Secondary | ICD-10-CM

## 2016-03-09 DIAGNOSIS — I4891 Unspecified atrial fibrillation: Secondary | ICD-10-CM

## 2016-03-09 DIAGNOSIS — Z7901 Long term (current) use of anticoagulants: Secondary | ICD-10-CM | POA: Diagnosis not present

## 2016-03-09 LAB — POCT INR: INR: 2.8

## 2016-03-09 MED ORDER — HYDROCODONE-ACETAMINOPHEN 5-325 MG PO TABS: 1.0000 | ORAL_TABLET | Freq: Two times a day (BID) | ORAL | 0 refills | Status: DC | PRN

## 2016-03-09 NOTE — Patient Instructions (Signed)
Take 1/2 tablet all days except Wednesday

## 2016-03-09 NOTE — Progress Notes (Signed)
   Subjective:    Patient ID: Sarah [name], female    DOB: 1950/11/07, 65 y.o.   MRN: [id number]  HPI This patient was seen today for chronic pain  The medication list was reviewed and updated.   -Compliance with medication: yes, takes  - Number patient states they take daily: takes daily   -when was the last dose patient took? Last night.  The patient was advised the importance of maintaining medication and not using illegal substances with these.  Refills needed: Yes   The patient was educated that we can provide 3 monthly scripts for their medication, it is their responsibility to follow the instructions.  Side effects or complications from medications: None   Patient is aware that pain medications are meant to minimize the severity of the pain to allow their pain levels to improve to allow for better function. They are aware of that pain medications cannot totally remove their pain.  Due for UDT ( at least once per year) :  August 2017  Patient states no other concerns this visit.   Chronic antiocoag compliant Blood pressure medicine and blood pressure levels reviewed today with patient. Compliant with blood pressure medicine. States does not miss a dose. No obvious side effects. Blood pressure generally good when checked elsewhere. Watching salt intake.  Not much on the exercise   Patient notes ongoing compliance with antidepressant medication. No obvious side effects. Reports does not miss a dose. Overall continues to help depression substantially. No thoughts of homicide or suicide. Would like to maintain medication.        Review of Systems No headache, no major weight loss or weight gain, no chest pain no back pain abdominal pain no change in bowel habits complete ROS otherwise negative     Objective:   Physical Exam Alert vitals stable, NAD. Blood pressure good on repeat. HEENT normal. Lungs clear. Heart Not regular but controlled regular   rhythm. Significant crepitations both knees positive low back pain negative straight leg raise today.      Assessment & Plan:  Impression chronic back pain with ongoing need for meds discussed #2 atrial fibrillation good control on current meds discussed maintain same #3 chronic anticoagulation ongoing discussed maintain same dose of Coumadin. Chronic pain meds refilled. Diet exercise discussed. Flu shot discussed

## 2016-04-06 ENCOUNTER — Ambulatory Visit (INDEPENDENT_AMBULATORY_CARE_PROVIDER_SITE_OTHER): Payer: Medicare Other

## 2016-04-06 ENCOUNTER — Telehealth: Payer: Self-pay | Admitting: Family Medicine

## 2016-04-06 DIAGNOSIS — Z7901 Long term (current) use of anticoagulants: Secondary | ICD-10-CM

## 2016-04-06 LAB — POCT INR: INR: 2.1

## 2016-04-06 NOTE — Telephone Encounter (Signed)
Patient's INR looks good she was instructed to keep as is and follow-up in 4 weeks. Erroneous message put with INR. We will try to have that deleted so I can put correct message.

## 2016-04-06 NOTE — Telephone Encounter (Signed)
Nurse's-please make sure the patient knows to keep on the current dose of Coumadin. Recheck in 4 weeks. Erroneously I dictated a message on this patient's chart in and is trying to be removed regarding holding Coumadin for 2 days

## 2016-04-07 NOTE — Telephone Encounter (Signed)
Patient verbalized understanding  

## 2016-04-07 NOTE — Telephone Encounter (Signed)
Spoke with patient and informed her per Dr.Scott Luking- To continue on same dose of coumadin and recheck in 4 weeks.

## 2016-04-15 ENCOUNTER — Other Ambulatory Visit: Payer: Self-pay | Admitting: Family Medicine

## 2016-04-20 ENCOUNTER — Other Ambulatory Visit: Payer: Self-pay | Admitting: Family Medicine

## 2016-05-04 ENCOUNTER — Ambulatory Visit (INDEPENDENT_AMBULATORY_CARE_PROVIDER_SITE_OTHER): Payer: Medicare Other

## 2016-05-04 DIAGNOSIS — Z7901 Long term (current) use of anticoagulants: Secondary | ICD-10-CM

## 2016-05-04 LAB — POCT INR: INR: 2.2

## 2016-05-06 ENCOUNTER — Other Ambulatory Visit: Payer: Self-pay | Admitting: Family Medicine

## 2016-05-12 ENCOUNTER — Telehealth: Payer: Self-pay | Admitting: Family Medicine

## 2016-05-12 NOTE — Telephone Encounter (Signed)
Please do a prescription these can be signed then sent on

## 2016-05-12 NOTE — Telephone Encounter (Signed)
I do not see these have been sent by this office. Please advise.

## 2016-05-12 NOTE — Telephone Encounter (Signed)
Pt needs a Rx faxed to [facility] for to get some new CPAP supplies   Ph# [phone] Fx# [phone]   Please call pt when done

## 2016-05-25 NOTE — Telephone Encounter (Signed)
[facility] to clarify how order needs to be written  Malena Catholic in [place] -- Order written & given to Dr. Richardson Landry for signature & to forward to me to be faxed  Order is good for 1 year for her supplies per Medicare guidelines   Fax# [phone] Ph$ [phone]

## 2016-05-29 NOTE — Telephone Encounter (Signed)
CPAP supply Rx faxed to [facility] and called to notify pt that order was sent, pt verbalized understanding

## 2016-06-08 ENCOUNTER — Ambulatory Visit (INDEPENDENT_AMBULATORY_CARE_PROVIDER_SITE_OTHER): Payer: Medicare Other | Admitting: Family Medicine

## 2016-06-08 ENCOUNTER — Encounter: Payer: Self-pay | Admitting: Family Medicine

## 2016-06-08 VITALS — BP 118/84 | Ht 67.0 in | Wt 264.2 lb

## 2016-06-08 DIAGNOSIS — E7800 Pure hypercholesterolemia, unspecified: Secondary | ICD-10-CM

## 2016-06-08 DIAGNOSIS — I4891 Unspecified atrial fibrillation: Secondary | ICD-10-CM

## 2016-06-08 DIAGNOSIS — Z7901 Long term (current) use of anticoagulants: Secondary | ICD-10-CM | POA: Diagnosis not present

## 2016-06-08 DIAGNOSIS — F419 Anxiety disorder, unspecified: Secondary | ICD-10-CM | POA: Diagnosis not present

## 2016-06-08 DIAGNOSIS — M1990 Unspecified osteoarthritis, unspecified site: Secondary | ICD-10-CM | POA: Diagnosis not present

## 2016-06-08 LAB — POCT INR: INR: 2.2

## 2016-06-08 MED ORDER — HYDROCODONE-ACETAMINOPHEN 5-325 MG PO TABS: 1.0000 | ORAL_TABLET | Freq: Two times a day (BID) | ORAL | 0 refills | Status: DC | PRN

## 2016-06-08 NOTE — Progress Notes (Signed)
   Subjective:    Patient ID: Sarah [name], female    DOB: [birth year], 66 y.o.   MRN: [id number]  HPI This patient was seen today for chronic pain  The medication list was reviewed and updated.   -Compliance with medication: yes  - Number patient states they take daily: 2  -when was the last dose patient took? yesterday  The patient was advised the importance of maintaining medication and not using illegal substances with these.  Refills needed: yes  The patient was educated that we can provide 3 monthly scripts for their medication, it is their responsibility to follow the instructions.  Side effects or complications from medications: none  Patient is aware that pain medications are meant to minimize the severity of the pain to allow their pain levels to improve to allow for better function. They are aware of that pain medications cannot totally remove their pain.  Due for UDT ( at least once per year) : 11/2015  Results for orders placed or performed in visit on 06/08/16  POCT INR  Result Value Ref Range   INR 2.2       INR 2.2  Headache for the past two weeks. Bad episode of vertigo, unsteady, with it, still residual symptoms plus nausea. Dull frontal heafdache,  Diffuse in nature, no cong or stuffiness   shootin pain left ear, Off-and-on associated with headache. Overall dizziness now has pretty much resolved.  Compliant with chronic anticoagulation. Takes his for history of recurrent clots along with atrial fibrillation. Does not miss a dose.     Review of Systems No headache, no major weight loss or weight gain, no chest pain no back pain abdominal pain no change in bowel habits complete ROS otherwise negative     Objective:   Physical Exam Alert vitals stable, NAD. Blood pressure good on repeat. HEENT normal. Lungs clear. Heart regular rate and rhythm. Neuro exam intact fundi discharge neck supple TMs fine no focal neurological deficits         Assessment & Plan:  Impression nonspecific headaches 2 weeks duration with some associated dizziness though difficult to tell since patient does also have chronic dizziness #2 chronic pain discussed with ongoing need for meds meds refilled #3 chronic anticoagulation discussed with ongoing need for meds INR good at 2. to continue same treatment. Follow-up in 3 months call in 2 weeks if headache persists we'll set up a scan I don't think she needs one at this time. Also recommend preventive wellness women's exam with Hoyle Sauer

## 2016-06-09 ENCOUNTER — Ambulatory Visit: Payer: Medicare Other | Admitting: Family Medicine

## 2016-06-16 ENCOUNTER — Other Ambulatory Visit: Payer: Self-pay | Admitting: Family Medicine

## 2016-07-01 ENCOUNTER — Other Ambulatory Visit: Payer: Self-pay | Admitting: Family Medicine

## 2016-07-06 ENCOUNTER — Ambulatory Visit (INDEPENDENT_AMBULATORY_CARE_PROVIDER_SITE_OTHER): Payer: Medicare Other

## 2016-07-06 DIAGNOSIS — Z7901 Long term (current) use of anticoagulants: Secondary | ICD-10-CM

## 2016-07-06 LAB — POCT INR: INR: 2.5

## 2016-07-06 NOTE — Patient Instructions (Signed)
Take 1 whole tablet on wednesdays and 1/2 all other days.

## 2016-08-01 ENCOUNTER — Telehealth: Payer: Self-pay | Admitting: Family Medicine

## 2016-08-01 MED ORDER — SUCRALFATE 1 G PO TABS: 1.0000 g | ORAL_TABLET | Freq: Two times a day (BID) | ORAL | 5 refills | Status: DC

## 2016-08-01 NOTE — Telephone Encounter (Signed)
Pt is needing refills on sucralfate (CARAFATE) 1 g tablet    [facility]

## 2016-08-01 NOTE — Telephone Encounter (Signed)
Ok six ref 

## 2016-08-01 NOTE — Telephone Encounter (Signed)
Spoke with patient and informed her per Dr.Steve Luking- 6 month of refills was sent into [facility] on [address]. Patient verbalized understanding.

## 2016-08-03 ENCOUNTER — Ambulatory Visit (INDEPENDENT_AMBULATORY_CARE_PROVIDER_SITE_OTHER): Payer: Medicare Other

## 2016-08-03 DIAGNOSIS — Z7901 Long term (current) use of anticoagulants: Secondary | ICD-10-CM

## 2016-08-03 LAB — POCT INR: INR: 3

## 2016-08-03 NOTE — Patient Instructions (Signed)
Take 1/2 tablet all days except Wednesdays take 1 tablet. Return in 4 weeks.

## 2016-08-24 ENCOUNTER — Ambulatory Visit (HOSPITAL_COMMUNITY)
Admission: RE | Admit: 2016-08-24 | Discharge: 2016-08-24 | Disposition: A | Discharge status: Home / Self Care | Payer: Medicare Other | Source: Ambulatory Visit | Attending: Nurse Practitioner | Admitting: Nurse Practitioner

## 2016-08-24 ENCOUNTER — Encounter: Payer: Self-pay | Admitting: Nurse Practitioner

## 2016-08-24 ENCOUNTER — Ambulatory Visit (INDEPENDENT_AMBULATORY_CARE_PROVIDER_SITE_OTHER): Payer: Medicare Other | Admitting: Nurse Practitioner

## 2016-08-24 ENCOUNTER — Telehealth: Payer: Self-pay | Admitting: *Deleted

## 2016-08-24 VITALS — BP 122/72 | Ht 67.0 in | Wt 263.0 lb

## 2016-08-24 DIAGNOSIS — R918 Other nonspecific abnormal finding of lung field: Secondary | ICD-10-CM

## 2016-08-24 DIAGNOSIS — J90 Pleural effusion, not elsewhere classified: Secondary | ICD-10-CM | POA: Diagnosis not present

## 2016-08-24 DIAGNOSIS — R1012 Left upper quadrant pain: Secondary | ICD-10-CM | POA: Insufficient documentation

## 2016-08-24 DIAGNOSIS — R062 Wheezing: Secondary | ICD-10-CM | POA: Diagnosis not present

## 2016-08-24 DIAGNOSIS — J9811 Atelectasis: Secondary | ICD-10-CM | POA: Diagnosis not present

## 2016-08-24 NOTE — Telephone Encounter (Signed)
Incoming call from radiology. Call report on cxr. Report in epic

## 2016-08-24 NOTE — Progress Notes (Signed)
Subjective:  Presents for c/o LUQ abd pain for the past 2 weeks. Occurs everyday, mainly later in the day. Radiates to posterior axillary line. No fever. No specific triggers. Sharp pain in abd that changes to an ache near the back area. Interrupts sleep at times. No relief with position change. Slight relief with Vicodin. No urinary symptoms. Constipation. No blood in her stools. Nausea, no vomiting. On Protonix, reflux stable. Uses Carafate at night about once a week for breakthrough GERD. No CP/ischemic type pain or unusual SOB. Started smoking at age 66.   Objective:   BP 122/72   Ht '5\' 7"'$  (1.702 m)   Wt 263 lb (119.3 kg)   BMI 41.19 kg/m  NAD. Alert, oriented. Lungs one faint expiratory wheeze posterior LUL; mildly diminished BS but otherwise clear. No tachypnea. Heart RRR. Abdomen obese, non distended with active BS. Distinct mid LUQ tenderness to palpation. Radiates slightly into anterior axillary line. No pain with palpation of left lower anterior chest wall. Chest xray shows 9.3 cm mass LML.    Assessment:  Left upper quadrant pain - Plan: US Abdomen Complete, DG Chest 2 View, CBC with Differential/Platelet, Basic metabolic panel, Lipase, Hepatic function panel  Wheezing  Mass of left lung - Plan: CT Chest W Contrast    Plan:  CT scan of chest pending. Labs and [place] pending. Warning signs reviewed. Call or go to ED in the meantime if worse.

## 2016-08-25 LAB — CBC WITH DIFFERENTIAL/PLATELET
BASOS ABS: 0 10*3/uL (ref 0.0–0.2)
Basos: 0 %
EOS (ABSOLUTE): 0.1 10*3/uL (ref 0.0–0.4)
Eos: 1 %
HEMOGLOBIN: 12.8 g/dL (ref 11.1–15.9)
Hematocrit: 40.1 % (ref 34.0–46.6)
IMMATURE GRANS (ABS): 0.1 10*3/uL (ref 0.0–0.1)
Immature Granulocytes: 1 %
LYMPHS: 15 %
Lymphocytes Absolute: 1.3 10*3/uL (ref 0.7–3.1)
MCH: 26.9 pg (ref 26.6–33.0)
MCHC: 31.9 g/dL (ref 31.5–35.7)
MCV: 84 fL (ref 79–97)
MONOCYTES: 10 %
Monocytes Absolute: 0.9 10*3/uL (ref 0.1–0.9)
NEUTROS PCT: 73 %
Neutrophils Absolute: 6.3 10*3/uL (ref 1.4–7.0)
PLATELETS: 347 10*3/uL (ref 150–379)
RBC: 4.75 x10E6/uL (ref 3.77–5.28)
RDW: 15.8 % — ABNORMAL HIGH (ref 12.3–15.4)
WBC: 8.7 10*3/uL (ref 3.4–10.8)

## 2016-08-25 LAB — HEPATIC FUNCTION PANEL
ALT: 15 IU/L (ref 0–32)
AST: 15 IU/L (ref 0–40)
Albumin: 3.9 g/dL (ref 3.6–4.8)
Alkaline Phosphatase: 129 IU/L — ABNORMAL HIGH (ref 39–117)
BILIRUBIN, DIRECT: 0.08 mg/dL (ref 0.00–0.40)
Bilirubin Total: 0.3 mg/dL (ref 0.0–1.2)
TOTAL PROTEIN: 7 g/dL (ref 6.0–8.5)

## 2016-08-25 LAB — BASIC METABOLIC PANEL
BUN / CREAT RATIO: 8 — AB (ref 12–28)
BUN: 8 mg/dL (ref 8–27)
CHLORIDE: 100 mmol/L (ref 96–106)
CO2: 29 mmol/L (ref 18–29)
CREATININE: 1.05 mg/dL — AB (ref 0.57–1.00)
Calcium: 9.4 mg/dL (ref 8.7–10.3)
GFR calc Af Amer: 64 mL/min/{1.73_m2} (ref >59)
GFR calc non Af Amer: 56 mL/min/{1.73_m2} — ABNORMAL LOW (ref >59)
GLUCOSE: 102 mg/dL — AB (ref 65–99)
Potassium: 4.4 mmol/L (ref 3.5–5.2)
Sodium: 143 mmol/L (ref 134–144)

## 2016-08-25 LAB — LIPASE: Lipase: 31 U/L (ref 14–72)

## 2016-08-25 NOTE — Telephone Encounter (Signed)
done

## 2016-08-29 ENCOUNTER — Ambulatory Visit (HOSPITAL_COMMUNITY)
Admission: RE | Admit: 2016-08-29 | Discharge: 2016-08-29 | Disposition: A | Discharge status: Home / Self Care | Payer: Medicare Other | Source: Ambulatory Visit | Attending: Nurse Practitioner | Admitting: Nurse Practitioner

## 2016-08-29 DIAGNOSIS — Z9049 Acquired absence of other specified parts of digestive tract: Secondary | ICD-10-CM | POA: Insufficient documentation

## 2016-08-29 DIAGNOSIS — R1012 Left upper quadrant pain: Secondary | ICD-10-CM | POA: Diagnosis not present

## 2016-08-29 DIAGNOSIS — K769 Liver disease, unspecified: Secondary | ICD-10-CM | POA: Insufficient documentation

## 2016-08-30 ENCOUNTER — Ambulatory Visit (HOSPITAL_COMMUNITY)
Admission: RE | Admit: 2016-08-30 | Discharge: 2016-08-30 | Disposition: A | Discharge status: Home / Self Care | Payer: Medicare Other | Source: Ambulatory Visit | Attending: Nurse Practitioner | Admitting: Nurse Practitioner

## 2016-08-30 DIAGNOSIS — J90 Pleural effusion, not elsewhere classified: Secondary | ICD-10-CM | POA: Insufficient documentation

## 2016-08-30 DIAGNOSIS — J984 Other disorders of lung: Secondary | ICD-10-CM | POA: Insufficient documentation

## 2016-08-30 DIAGNOSIS — J9819 Other pulmonary collapse: Secondary | ICD-10-CM | POA: Diagnosis not present

## 2016-08-30 DIAGNOSIS — R918 Other nonspecific abnormal finding of lung field: Secondary | ICD-10-CM | POA: Insufficient documentation

## 2016-08-30 MED ORDER — IOPAMIDOL (ISOVUE-300) INJECTION 61%: 100.0000 mL | Freq: Once | INTRAVENOUS | Status: DC | PRN

## 2016-08-30 MED ORDER — IOPAMIDOL (ISOVUE-300) INJECTION 61%
75.0000 mL | Freq: Once | INTRAVENOUS | Status: AC | PRN
  Administered 2016-08-30: 75 mL via INTRAVENOUS

## 2016-09-01 ENCOUNTER — Other Ambulatory Visit: Payer: Self-pay

## 2016-09-01 ENCOUNTER — Ambulatory Visit (INDEPENDENT_AMBULATORY_CARE_PROVIDER_SITE_OTHER): Payer: Medicare Other | Admitting: Family Medicine

## 2016-09-01 DIAGNOSIS — R918 Other nonspecific abnormal finding of lung field: Secondary | ICD-10-CM | POA: Diagnosis not present

## 2016-09-01 MED ORDER — HYDROCODONE-ACETAMINOPHEN 7.5-325 MG PO TABS: ORAL_TABLET | ORAL | 0 refills | Status: DC

## 2016-09-01 MED ORDER — BUPROPION HCL ER (SR) 150 MG PO TB12: ORAL_TABLET | ORAL | 3 refills | Status: DC

## 2016-09-01 NOTE — Progress Notes (Signed)
   Subjective:    Patient ID: Sarah [name], female    DOB: 16-Feb-1951, 66 y.o.   MRN: [id number]  HPI Patient arrives to the office for protracted discussion regarding results of her CT scan.  Please see prior notes.  Of note patient has been having some left-sided chest discomfort over the past 4-6 weeks. Hard to pinpoint. Both anterior and posterior chest. Also a bit more of a cough in recent weeks. Generally nonproductive. No hemoptysis no weight loss. No excessive fatigue  Patient had an x-ray which revealed an abnormality in one onto do this chest scan  Patient gives smoking history since age 46 through 10 day   Patient arrive to discuss results of recent CT scan.  Review of Systems No headache, no major weight loss or weight gain, no chest pain no back pain abdominal pain no change in bowel habits complete ROS otherwise negative     Objective:   Physical Exam Alert and oriented, vitals reviewed and stable, NAD ENT-TM's and ext canals WNL bilat via otoscopic exam Soft palate, tonsils and post pharynx WNL via oropharyngeal exam Neck-symmetric, no masses; thyroid nonpalpable and nontender Pulmonary-no tachypnea or accessory muscle use; Clear without wheezes via auscultation Card--no abnrml murmurs, rhythm reg and rate WNL Carotid pulses symmetric, without bruits        Assessment & Plan:  Impression left pulmonary mass centrally located with bronchus obstruction partial collapse of left lower lobe accompanied by enlargement of lymph nodes. Unfortunately this is some ominous scan. Discussed with patient the fairly high likelihood that this represents lung cancer. Need to move on with prompt diagnosis. Discussed with patient. Will make a referral to the cardiothoracic team at [facility], discussed with patient these are top-notch individuals and will do a good job for her  Greater than 50% of this 25 minute face to face visit was spent in counseling and discussion and  coordination of care regarding the above diagnosis/diagnosies

## 2016-09-05 ENCOUNTER — Ambulatory Visit (INDEPENDENT_AMBULATORY_CARE_PROVIDER_SITE_OTHER): Payer: Medicare Other | Admitting: Family Medicine

## 2016-09-05 ENCOUNTER — Encounter: Payer: Self-pay | Admitting: Family Medicine

## 2016-09-05 VITALS — BP 128/70 | Ht 67.0 in | Wt 256.0 lb

## 2016-09-05 DIAGNOSIS — R918 Other nonspecific abnormal finding of lung field: Secondary | ICD-10-CM | POA: Diagnosis not present

## 2016-09-05 DIAGNOSIS — Z7901 Long term (current) use of anticoagulants: Secondary | ICD-10-CM | POA: Diagnosis not present

## 2016-09-05 DIAGNOSIS — R1012 Left upper quadrant pain: Secondary | ICD-10-CM

## 2016-09-05 DIAGNOSIS — I4891 Unspecified atrial fibrillation: Secondary | ICD-10-CM | POA: Diagnosis not present

## 2016-09-05 LAB — POCT INR: INR: 4.4

## 2016-09-05 MED ORDER — HYDROCODONE-ACETAMINOPHEN 7.5-325 MG PO TABS: ORAL_TABLET | ORAL | 0 refills | Status: DC

## 2016-09-05 NOTE — Progress Notes (Signed)
   Subjective:    Patient ID: Sarah [name], female    DOB: [birth year], 66 y.o.   MRN: [id number] Patient arrives office for multiple reasons HPI This patient was seen today for chronic pain  The medication list was reviewed and updated.   -Compliance with medication: yes  - Number patient states they take daily: akes 7.5 maybe twice a day. Takes '5mg'$  2 -3 times a day.  -when was the last dose patient took? today   The patient was advised the importance of maintaining medication and not using illegal substances with these.  Refills needed: yes  The patient was educated that we can provide 3 monthly scripts for their medication, it is their responsibility to follow the instructions.  Side effects or complications from medications: nausea several times since increasing dose of pain med.   Patient is aware that pain medications are meant to minimize the severity of the pain to allow their pain levels to improve to allow for better function. They are aware of that pain medications cannot totally remove their pain.  Due for UDT ( at least once per year) : last one august 2017  INR 4.4.     Further discussion held regarding patient's presumed lung carcinoma. Multiple questions answered  Review of Systems No headache, no major weight loss or weight gain, no chest pain no back pain abdominal pain no change in bowel habits complete ROS otherwise negative     Objective:   Physical Exam  Alert and oriented, vitals reviewed and stable, NAD ENT-TM's and ext canals WNL bilat via otoscopic exam Soft palate, tonsils and post pharynx WNL via oropharyngeal exam Neck-symmetric, no masses; thyroid nonpalpable and nontender Pulmonary-no tachypnea or accessory muscle use; Clear without wheezes via auscultation Card--no abnrml murmurs, rhythm reg and rate WNL Carotid pulses symmetric, without bruits       Assessment & Plan:  Impression chronic pain with element of arthritis now worsening  with deep thoracic pain secondary to presumed carcinoma. Increase hydrocodone patient to maintain rationale discussed #2 anticoagulation INR to high discussed meds adjusted #3 depression understandable flare. Although patient describing an overall good philosophy plan maintain higher dose of hydrocodone. Images Coumadin. Many questions answered.  Greater than 50% of this 25 minute face to face visit was spent in counseling and discussion and coordination of care regarding the above diagnosis/diagnosies

## 2016-09-07 ENCOUNTER — Encounter: Payer: Self-pay | Admitting: Thoracic Surgery (Cardiothoracic Vascular Surgery)

## 2016-09-07 ENCOUNTER — Institutional Professional Consult (permissible substitution) (INDEPENDENT_AMBULATORY_CARE_PROVIDER_SITE_OTHER): Payer: Medicare Other | Admitting: Thoracic Surgery (Cardiothoracic Vascular Surgery)

## 2016-09-07 VITALS — BP 144/81 | HR 68 | Resp 20 | Ht 65.0 in | Wt 256.0 lb

## 2016-09-07 DIAGNOSIS — R918 Other nonspecific abnormal finding of lung field: Secondary | ICD-10-CM | POA: Diagnosis not present

## 2016-09-07 NOTE — Progress Notes (Signed)
PCP is Luking, Grace Bushy, MD Referring Provider is [name], MD  Chief Complaint  Patient presents with  . Lung Mass    Surgical eval, Chest CT 08/30/16    HPI: 66 yo woman sent for consultation re: left lower lobe mass  Sarah [name] is a 66 yo woman who has a past history of morbid obesity, ongoing tobacco abuse (100 py/ 1 ppd currently), paroxysmal atrial fibrillation, DVT, PE, chronic venous stasis, OSA requiring CPAP, GERD, chronic back pain and anxiety. She recently developed left upper quadrant pain. She described this as a sharp pain that begins in her left upper quadrant just below her costal margin and radiates to the back. As part of her evaluation a chest x-ray was done. It showed a left lung mass. A CT of the chest was done, which showed a 7.5 cm left lower lobe mass with partial left lower lobe atelectasis. There were some prominent but are not pathologically enlarged mediastinal lymph nodes. The left lower lobe bronchus did appear to be completely occluded. There was an adrenal mass on the right, but it was felt to be a possible adenoma.  Her physical activities are relatively limited. She has morbid obesity and chronic back pain. She also has chronic venous stasis of her legs due to previous DVT. 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 She started smoking at age 54 and smoked 2 packs per day for most of her adult life. She currently is smoking a pack a day. She denies any chest pain, pressure, or tightness other than the left upper quadrant pain noted above. She does get short of breath with walking up any incline. She's had a dry cough. Denies hemoptysis. She has noted some mild wheezing recently.  Zubrod Score: At the time of surgery this patient's most appropriate activity status/level should be described as: '[]'$     0    Normal activity, no symptoms '[x]'$     1    Restricted in physical strenuous activity but ambulatory, able to do out light work '[]'$     2     Ambulatory and capable of self care, unable to do work activities, up and about >50 % of waking hours                              '[]'$     3    Only limited self care, in bed greater than 50% of waking hours '[]'$     4    Completely disabled, no self care, confined to bed or chair '[]'$     5    Moribund  Past Medical History:  Diagnosis Date  . Arthritis   . Chronic anxiety    Patient describes as stress  . Chronic back pain   . DVT (deep venous thrombosis) ([place]) age 41  . GERD (gastroesophageal reflux disease)   . Headache(784.0)   . Hypoestrogenism   . Lower extremity venous stasis    LLE, chronic  . Paroxysmal atrial fibrillation ([facility])   . Pulmonary embolism ([place])   . Reflux   . Sleep apnea   . Spinal stenosis   . Tobacco abuse   . Venous stasis   . Warfarin anticoagulation     Past Surgical History:  Procedure Laterality Date  . ANKLE SURGERY     x2  . APPENDECTOMY    . CARPAL TUNNEL RELEASE    . CHOLECYSTECTOMY    .  OVARIAN CYST REMOVAL    . ROTATOR CUFF REPAIR    . TUBAL LIGATION      Family History  Problem Relation Age of Onset  . Arrhythmia Mother   . Cancer Mother        colon  . Heart disease Mother   . Stroke Father        Deceased    Social History Social History  Substance Use Topics  . Smoking status: Current Every Day Smoker    Packs/day: 1.00    Years: 40.00    Types: Cigarettes  . Smokeless tobacco: Never Used     Comment: smokes about 2 cigarettes a day now.   . Alcohol use No    Current Outpatient Prescriptions  Medication Sig Dispense Refill  . buPROPion (WELLBUTRIN SR) 150 MG 12 hr tablet TAKE 1 TABLET BY MOUTH TWICE DAILY **KEEP APPT ON 06/09/16** 180 tablet 3  . calcium carbonate (TUMS - DOSED IN MG ELEMENTAL CALCIUM) 500 MG chewable tablet Chew 1 tablet by mouth 3 (three) times daily as needed for heartburn.    . diltiazem (CARDIZEM CD) 180 MG 24 hr capsule TAKE ONE CAPSULE BY MOUTH EVERY DAY 90 capsule 1  . flecainide (TAMBOCOR) 50  MG tablet TAKE 1 TABLET (50 MG TOTAL) BY MOUTH 2 (TWO) TIMES DAILY. 60 tablet 11  . HYDROcodone-acetaminophen (NORCO) 7.5-325 MG tablet One tablet up to four times a day as needed for pain 120 tablet 0  . metoprolol tartrate (LOPRESSOR) 25 MG tablet 1 tablet as needed for heart racing 10 tablet 5  . pantoprazole (PROTONIX) 40 MG tablet TAKE 1 TABLET EVERY MORNING 90 tablet 1  . sucralfate (CARAFATE) 1 g tablet Take 1 tablet (1 g total) by mouth 2 (two) times daily. 60 tablet 5  . warfarin (COUMADIN) 5 MG tablet TAKE 1 TABLET BY MOUTH EVERY DAY AS DIRECTED (Patient taking differently: 2.5 mg daily) 90 tablet 1   No current facility-administered medications for this visit.     Allergies  Allergen Reactions  . Aspirin Nausea And Vomiting    Review of Systems  Constitutional: Negative for activity change, chills, fever and unexpected weight change.  HENT: Negative for trouble swallowing and voice change.   Eyes: Negative for visual disturbance.  Respiratory: Positive for cough, shortness of breath and wheezing.   Cardiovascular: Positive for leg swelling. Negative for chest pain and palpitations.  Gastrointestinal: Positive for abdominal pain (LUQ). Negative for blood in stool.  Genitourinary: Negative for dysuria and hematuria.  Musculoskeletal: Positive for arthralgias and joint swelling.  Neurological: Negative for dizziness, seizures, syncope and weakness.  Hematological: Negative for adenopathy. Bruises/bleeds easily (Coumadin).  All other systems reviewed and are negative.   BP (!) 144/81   Pulse 68   Resp 20   Ht '5\' 5"'$  (1.651 m)   Wt 256 lb (116.1 kg)   SpO2 93% Comment: RA  BMI 42.60 kg/m  Physical Exam  Constitutional: She is oriented to person, place, and time. She appears well-developed and well-nourished. No distress.  Morbidly obese  HENT:  Head: Normocephalic and atraumatic.  Mouth/Throat: No oropharyngeal exudate.  Eyes: Conjunctivae and EOM are normal. Pupils  are equal, round, and reactive to light. No scleral icterus.  Neck: Neck supple. No thyromegaly present.  Cardiovascular: Normal rate, regular rhythm and normal heart sounds.   No murmur heard. Pulmonary/Chest: No respiratory distress. She has wheezes (left > right).  Abdominal: Soft. She exhibits no distension. There is no tenderness.  Musculoskeletal:  She exhibits edema and deformity (chronic venous stasis changes on left).  Lymphadenopathy:    She has no cervical adenopathy.  Neurological: She is alert and oriented to person, place, and time. She exhibits normal muscle tone. Coordination normal.  Skin: Skin is warm and dry.  Vitals reviewed.    Diagnostic Tests: CT CHEST WITH CONTRAST  TECHNIQUE: Multidetector CT imaging of the chest was performed during intravenous contrast administration.  CONTRAST:  59m ISOVUE-300 IOPAMIDOL (ISOVUE-300) INJECTION 61%  COMPARISON:  Chest x-ray 08/24/2016.  FINDINGS: Cardiovascular: Heart is normal size. Aorta is normal caliber. Scattered aortic arch calcifications.  Mediastinum/Nodes: Small borderline sized mediastinal lymph nodes. Prevascular lymph nodes have a short axis diameter of 6 mm. Similarly sized subcarinal and right paratracheal lymph nodes. No visible hilar adenopathy although the left hilum is not well visualized due to surrounding soft tissue/mass. No axillary adenopathy.  Lungs/Pleura: Probable large central mass in the left hilum. Associated collapse of the left lower lobe. The central mass is approximately 7.5 x 7.6 cm on image 60. Obstruction of the left lower lobe bronchi noted. Trace right pleural effusion. Linear scarring in the right middle lobe. Right lung otherwise clear. Biapical scarring.  Upper Abdomen: 2.7 cm nodule in the right adrenal gland which is low-density on this enhanced study and likely reflects adenoma. No acute findings in the upper abdomen.  Musculoskeletal: Chest wall soft tissues  are unremarkable. No acute bony abnormality.  IMPRESSION: Large central mass in the central left lung/left lower lobe with obstruction of the left lower lobe bronchi and left lower lobe collapse. Findings most compatible with central left lung cancer. Recommend cardiothoracic consultation and tissue sampling. PET CT may be beneficial to better characterize the central left lung mass.  Trace left pleural effusion.  Low-density mass in the right adrenal gland. Given its relatively low-density on this contrasted study, I favor this represents adenoma, but recommend attention on follow-up imaging and/or PET CT.   Electronically Signed   By: KRolm BaptiseM.D.   On: 08/31/2016 08:27 I personally reviewed the CT chest and concur with the findings noted above  Impression: Mrs. PWrenis a 66year old woman with multiple medical issues including morbid obesity, sleep apnea, paroxysmal atrial fibrillation, history of DVT and PE, and chronic venous stasis of her lower extremities. She has a long history of heavy tobacco abuse. She initially presented with left upper quadrant pain. During that workup she was found to have a large left lower lobe mass with obstructive atelectasis. Her pain may well be referred pain from pleural irritation.  I long discussion with Sarah [name], her husband, and her daughter. I reviewed the films with them. We discussed the differential diagnosis, which in this case, realistically, is only a new primary bronchogenic carcinoma. We discussed the need for completing diagnostic and staging workup.  I think the best way to establish a diagnosis her case is with bronchoscopy and endobronchial ultrasound for biopsy. The mass itself should be visible on bronchoscopy. They do understand that there is no guarantee a diagnosis will be made as sometimes large fast-growing tumors will have extensive necrosis. I informed him of the general nature of the procedure. We would  plan to do this in the operating room under general anesthesia. We will plan to do it on an outpatient basis. They understand this is endoscopic in nature. I reviewed the indications, risks, benefits, and alternatives. They understand the risks include those of general anesthesia. They understand procedure specific risks include failure to  make a diagnosis, pneumothorax, bleeding, as well as the possibility of other unforeseeable complications such as MI, stroke, blood clot, in rare cases death. She accepts the risks and wishes to proceed.  We also need to do a PET/CT and an MR of the brain to complete her staging workup. We will go ahead and schedule those.  I don't think this will and up being resectable, and would not even consider the possibility of resection if she continues to smoke. Just in case we will check pulmonary function testing to see if that is even an option.  Her Coumadin dose was recently decreased to 2.5 mg a day due to her INR being elevated at 4.4. She currently is holding her Coumadin until she gets her INR rechecked on Monday. I do not want to bridge her with Lovenox with her INR being that elevated. She will follow-up with Dr. Wolfgang Phoenix on Monday after her INR to determine whether she needs to be bridged prior to her procedure on Thursday.  Plan:  PET/CT MR brain PFTs with and without bronchodilators Bronchoscopy and EBUS on Thursday 5/17 Hold coumadin until follow up with Dr. Wolfgang Phoenix on Monday  Melrose Nakayama, MD [facility] [phone]

## 2016-09-08 ENCOUNTER — Other Ambulatory Visit: Payer: Self-pay | Admitting: *Deleted

## 2016-09-08 ENCOUNTER — Telehealth: Payer: Self-pay | Admitting: Family Medicine

## 2016-09-08 ENCOUNTER — Telehealth: Payer: Self-pay | Admitting: *Deleted

## 2016-09-08 DIAGNOSIS — R918 Other nonspecific abnormal finding of lung field: Secondary | ICD-10-CM

## 2016-09-08 MED ORDER — ENOXAPARIN SODIUM 120 MG/0.8ML ~~LOC~~ SOLN: SUBCUTANEOUS | 0 refills | Status: DC

## 2016-09-08 NOTE — Telephone Encounter (Signed)
Can call pt on [phone], or [phone]. Pt and [facility] notified we waiting on prior auth

## 2016-09-08 NOTE — Telephone Encounter (Signed)
PET scan and bronchoscopy scheduled for next Thursday, May 17th. She would like to know what to do about her coumadin.

## 2016-09-08 NOTE — Telephone Encounter (Signed)
Await PA on lovenox.

## 2016-09-08 NOTE — Telephone Encounter (Signed)
Discussed with pt. Pt verbalized understanding. Med sent to pharm.

## 2016-09-08 NOTE — Telephone Encounter (Signed)
My understanding per pt's note may have already stopped coum adin, if not, go ahead and stop to honor surgeon's wishes. Sunday thru wed morn, use lovenox 115 mg q twelve hours. Last dose wed morn (important)  Get inr tue or wed to document decline to accepatable levels  Resume coumadin and lovenox morn after procedure  Get inr the following mon d in office, call in 8 d worth of lovenox  Let pt know this is an aggravation but reduces risk of life threatening clot

## 2016-09-11 NOTE — Telephone Encounter (Signed)
Spoke with patient and verified that she picked up her prescription of lovanox with weekend and was able to start it on Sunday as directed. Pt verified that she started it and was only taking 115 mg of the 120 mg prescription.

## 2016-09-11 NOTE — Telephone Encounter (Signed)
PA never came through fax over the weekend ( plz follow up )

## 2016-09-11 NOTE — Telephone Encounter (Signed)
Spoke with [employer] Prior Authorization Approved. Authorization Number [id number]. Authorized from 09/08/16 to 10/09/16. Called pharmacy ([facility], [place], [place]) they are closed. Will re- attempt and try to speak with patient to verify pick up.

## 2016-09-12 ENCOUNTER — Other Ambulatory Visit: Payer: Self-pay | Admitting: Cardiology

## 2016-09-12 ENCOUNTER — Ambulatory Visit (HOSPITAL_COMMUNITY)
Admission: RE | Admit: 2016-09-12 | Discharge: 2016-09-12 | Disposition: A | Discharge status: Home / Self Care | Payer: Medicare Other | Source: Ambulatory Visit | Attending: Thoracic Surgery (Cardiothoracic Vascular Surgery) | Admitting: Thoracic Surgery (Cardiothoracic Vascular Surgery)

## 2016-09-12 DIAGNOSIS — I6782 Cerebral ischemia: Secondary | ICD-10-CM | POA: Insufficient documentation

## 2016-09-12 DIAGNOSIS — R51 Headache: Secondary | ICD-10-CM | POA: Insufficient documentation

## 2016-09-12 DIAGNOSIS — R918 Other nonspecific abnormal finding of lung field: Secondary | ICD-10-CM | POA: Diagnosis not present

## 2016-09-12 DIAGNOSIS — G319 Degenerative disease of nervous system, unspecified: Secondary | ICD-10-CM | POA: Diagnosis not present

## 2016-09-12 MED ORDER — GADOBENATE DIMEGLUMINE 529 MG/ML IV SOLN
20.0000 mL | Freq: Once | INTRAVENOUS | Status: AC | PRN
  Administered 2016-09-12: 20 mL via INTRAVENOUS

## 2016-09-13 ENCOUNTER — Encounter (HOSPITAL_COMMUNITY): Payer: Self-pay | Admitting: *Deleted

## 2016-09-13 ENCOUNTER — Ambulatory Visit
Admission: RE | Admit: 2016-09-13 | Discharge: 2016-09-13 | Disposition: A | Discharge status: Home / Self Care | Payer: Medicare Other | Source: Ambulatory Visit | Attending: Thoracic Surgery (Cardiothoracic Vascular Surgery) | Admitting: Thoracic Surgery (Cardiothoracic Vascular Surgery)

## 2016-09-13 ENCOUNTER — Telehealth: Payer: Self-pay | Admitting: Family Medicine

## 2016-09-13 ENCOUNTER — Ambulatory Visit (INDEPENDENT_AMBULATORY_CARE_PROVIDER_SITE_OTHER): Payer: Medicare Other

## 2016-09-13 ENCOUNTER — Ambulatory Visit (HOSPITAL_COMMUNITY)
Admission: RE | Admit: 2016-09-13 | Discharge: 2016-09-13 | Disposition: A | Discharge status: Home / Self Care | Payer: Medicare Other | Source: Ambulatory Visit | Attending: Thoracic Surgery (Cardiothoracic Vascular Surgery) | Admitting: Thoracic Surgery (Cardiothoracic Vascular Surgery)

## 2016-09-13 DIAGNOSIS — R918 Other nonspecific abnormal finding of lung field: Secondary | ICD-10-CM | POA: Insufficient documentation

## 2016-09-13 DIAGNOSIS — Z79899 Other long term (current) drug therapy: Secondary | ICD-10-CM | POA: Diagnosis not present

## 2016-09-13 DIAGNOSIS — Z7901 Long term (current) use of anticoagulants: Secondary | ICD-10-CM

## 2016-09-13 DIAGNOSIS — I700 Atherosclerosis of aorta: Secondary | ICD-10-CM | POA: Insufficient documentation

## 2016-09-13 LAB — POCT INR: INR: 1.1

## 2016-09-13 LAB — GLUCOSE, CAPILLARY: GLUCOSE-CAPILLARY: 98 mg/dL (ref 65–99)

## 2016-09-13 IMAGING — PT NM PET TUM IMG INITIAL (PI) SKULL BASE T - THIGH
2 series · 4 of 4 positions shown · non-contrast
Comparison: Chest CT 08/30/2016

CLINICAL DATA: Initial treatment strategy for left lower lobe lung
mass.

EXAM:
NUCLEAR MEDICINE PET SKULL BASE TO THIGH
TECHNIQUE: 13.1 mCi F-18 FDG was injected intravenously. Full-ring PET imaging
was performed from the skull base to thigh after the radiotracer. CT
data was obtained and used for attenuation correction and anatomic
localization.
FASTING BLOOD GLUCOSE:  Value: 98 mg/dl

[Series 1038: results mm oncology reading · 5.0mm · 0.88mm/px · 3 of 3 slices shown (1 of 2)]
[im 1/3]
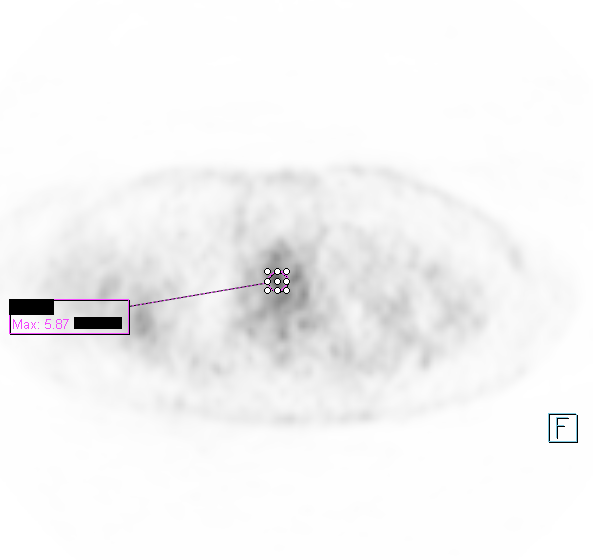
[im 2/3]
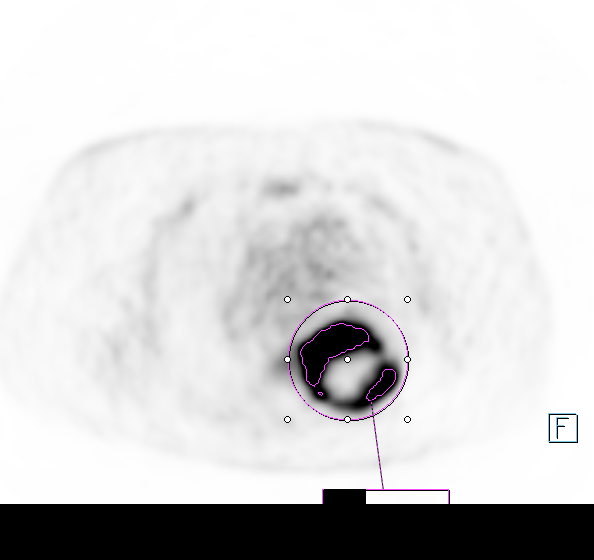
[im 3/3]
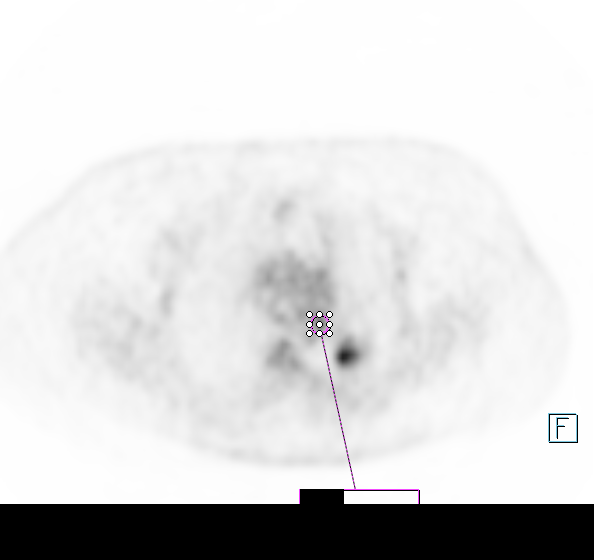

[Series 1040: results mm oncology reading · 5.0mm · 0.78mm/px · 1 of 1 slices shown (2 of 2)]
[im 1/1]
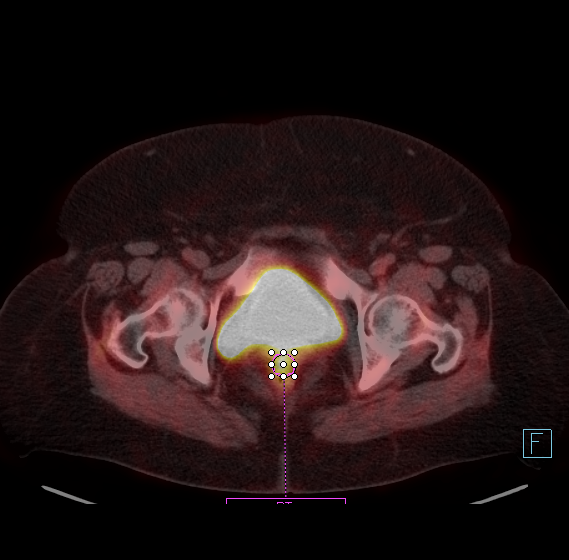

[4 of 4 positions shown; findings below may reference images not displayed]

FINDINGS: NECK

No hypermetabolic lymph nodes in the neck. Probable dilated
perivascular space below the right lentiform nucleus as shown on
recent MRI.

Mildly asymmetric activity along the right posterior glottis,
maximum SUV 5.9. A similar area on the left side has maximum SUV
4.0.

CHEST

Centrally necrotic left lower lobe mass about 8.9 cm in diameter,
maximum SUV 24.7. Calcifications in the adjacent truncated
tracheobronchial tree possibly due to inspissated mucus. I am unsure
whether this mass crosses the major fissure. There is some faint
nodularity posteriorly in the left upper lobe measuring about 0.6 by
0.3 cm, not visibly hypermetabolic but also below sensitive PET-CT
size thresholds.

A small suspected lymph node along the lateral margin of the
descending arch measures 4 mm in short axis on image 88/ 3 and has a
maximum SUV in of 4.7, mildly above background blood pool activity.

There is potentially some nodularity of tumor extending into the
sixth intercostal space on images 101-2 of series 3. This is not
entirely certain and does not constitute definitive chest wall
invasion. No adjacent bony findings.

Scarring or atelectasis in the right upper lobe and right middle
lobe medially.

ABDOMEN/PELVIS

No abnormal hypermetabolic activity within the liver, pancreas,
adrenal glands, or spleen. No hypermetabolic lymph nodes in the
abdomen or pelvis.

Physiologic activity in the right colon. Aortoiliac atherosclerotic
vascular disease.

Prior cholecystectomy.

Ill-defined accentuated activity in the vicinity of the cervix and
upper vagina, maximum SUV approximately 8.7. I am uncertain whether
some of this represents motion artifact related to bladder activity.

SKELETON

No focal hypermetabolic activity to suggest skeletal metastasis.
IMPRESSION: 1. Large centrally necrotic left lower lobe mass, 8.9 cm diameter,
maximum SUV 24.7. Although primarily in the right lower lobe I am
unsure whether this mass invades across the major fissure.
2. Small suspected neoplastic lymph node along the lateral margin of
the descending aortic arch, only 4 mm in short axis but mildly
hypermetabolic.
3. Questionable extension of tumor nodularity into the sixth
intercostal space, although not definitive for chest wall invasion.
4. Small nodule posteriorly in the left upper lobe 0.6 by 0.3 cm,
not visibly hypermetabolic but also below sensitive PET-CT size
thresholds.
5. No findings of metastatic disease to the abdomen, pelvis, or
skeleton. The mass does not appear to directly invade the adjacent
left posterior ribs.
6. Ill-defined accentuated activity in the vicinity of the cervix
and upper vagina, maximum SUV 8.7. Although possibly artifactual,
consider gynecology referral to exclude the unlikely possibility of
cervical malignancy.
7.  Aortic Atherosclerosis ([id number]).
8. Mildly asymmetric activity along the right upper posterior
glottis without definite CT correlate, probably incidental.

## 2016-09-13 MED ORDER — FLUDEOXYGLUCOSE F - 18 (FDG) INJECTION
13.1200 | Freq: Once | INTRAVENOUS | Status: AC | PRN
  Administered 2016-09-13: 13.12 via INTRAVENOUS

## 2016-09-13 MED ORDER — ALBUTEROL SULFATE (2.5 MG/3ML) 0.083% IN NEBU
2.5000 mg | INHALATION_SOLUTION | Freq: Once | RESPIRATORY_TRACT | Status: AC
  Administered 2016-09-13: 2.5 mg via RESPIRATORY_TRACT

## 2016-09-13 NOTE — Telephone Encounter (Signed)
I did call the patient made sure that she was aware not to take Lovenox on Wednesday evening she will have her procedure on Thursday the specialist will guide her on when to restart Lovenox and Coumadin she will recheck INR on Monday

## 2016-09-13 NOTE — Progress Notes (Signed)
Anesthesia Chart Review:  Pt is a same day work up.   Pt is a 66 year old female scheduled for video bronchoscopy with endobronchial ultrasound on 09/14/2016 with Modesto Charon, M.D.  - PCP is Sallee Lange, MD - Cardiologist is Rozann Lesches, MD, last office visit 10/14/15; 1 year f/u recommended.   PMH includes: PAF, PE, DVT, OSA, GERD. Current smoker. BMI 42.5.  Medications include: Diltiazem, Lovenox, flecainide, and metoprolol, Protonix, Coumadin.  Pt stopped coumadin 09/08/16; started lovenox 09/10/16  Labs will be obtained DOS.   CT chest 08/31/16:  - Large central mass in the central left lung/left lower lobe with obstruction of the left lower lobe bronchi and left lower lobe collapse. Findings most compatible with central left lung cancer. - Trace left pleural effusion. - Low-density mass in the right adrenal gland. Given its relatively low-density on this contrasted study, I favor this represents adenoma, but recommend attention on follow-up imaging and/or PET CT.  CXR 08/24/16:  1. Large 9.3 cm mass versus infiltrate noted over the left mid lung posteriorly. This could represent a malignancy. Contrast-enhanced chest CT suggest for further evaluation. 2. Mild lingular subsegmental atelectasis. Small left pleural effusion.  EKG 10/14/15: Sinus Rhythm. Low voltage in precordial leads. Prominent R(V1) -nonspecific.   Nuclear stress test 01/05/12:  - Negative pharmacologic stress nuclear myocardial study revealing normal left ventricular systolic function, no stress induced EKG abnormalities and normal myocardial perfusion except for the presence of breast attenuation artifact.  Other findings as noted.  Echo 12/29/11:  - Left ventricle: The cavity size was normal. Wall thicknesswas increased in a pattern of mild LVH. Systolic functionwas vigorous. The estimated ejection fraction was in therange of 75% to 80%. Wall motion was normal; there were noregional wall motion abnormalities.  Doppler parameters areconsistent with abnormal left ventricular relaxation(grade 1 diastolic dysfunction). - Mitral valve: Trivial regurgitation. - Left atrium: The atrium was mildly dilated. - Right ventricle: Hypertrophy was present. Systolic function was normal. - Tricuspid valve: Trivial regurgitation. - Pulmonary arteries: Systolic pressure could not be accurately estimated. - Pericardium, extracardiac: There was no pericardial effusion.  If labs acceptable DOS, I anticipate pt can proceed as scheduled.   Willeen Cass, FNP-BC [facility]/Anesthesiology Phone: [phone] 09/13/2016 11:13 AM

## 2016-09-13 NOTE — Progress Notes (Signed)
Pt denies SOB and chest pain but is under the care of Dr. Domenic Polite, Cardiology. Pt denies having a cardiac cath. Pt made aware to stop taking  Aspirin ( allergic), vitamins, fish oil and herbal medications. Do not take any NSAIDs ie: Ibuprofen, Advil, Naproxen, BC and Goody Powder or any medication containing Aspirin. Pt stated that last dose of Coumadin was Monday and last dose of Lovenox was this am as instructed by MD. Pt verbalized understanding of all pre-op instructions. Anesthesia asked to review pt history.

## 2016-09-13 NOTE — Telephone Encounter (Signed)
Patient's INR today is 1.1. She is currently on Lovenox for procedure scheduled for tomorrow with Dr. Roxan Hockey for a Bronchoscopy. Please advise when next INR and when patient is to start/ stop Lovenox and Coumadin.

## 2016-09-13 NOTE — Patient Instructions (Signed)
Hold Coumadin and Lovenox on 09/13/16. Resume on Friday if permitted by Surgeon on tomorrow and recheck INR on 09/18/16

## 2016-09-13 NOTE — Telephone Encounter (Signed)
Patient may be reached at her home number [phone] or [phone]

## 2016-09-14 ENCOUNTER — Encounter (HOSPITAL_COMMUNITY)
Admission: RE | Disposition: A | Discharge status: Home / Self Care | Payer: Self-pay | Source: Ambulatory Visit | Attending: Thoracic Surgery (Cardiothoracic Vascular Surgery)

## 2016-09-14 ENCOUNTER — Ambulatory Visit (HOSPITAL_COMMUNITY): Payer: Medicare Other | Admitting: Emergency Medicine

## 2016-09-14 ENCOUNTER — Ambulatory Visit (HOSPITAL_COMMUNITY)
Admission: RE | Admit: 2016-09-14 | Discharge: 2016-09-14 | Disposition: A | Discharge status: Home / Self Care | Payer: Medicare Other | Source: Ambulatory Visit | Attending: Thoracic Surgery (Cardiothoracic Vascular Surgery) | Admitting: Thoracic Surgery (Cardiothoracic Vascular Surgery)

## 2016-09-14 ENCOUNTER — Encounter (HOSPITAL_COMMUNITY): Payer: Self-pay | Admitting: *Deleted

## 2016-09-14 ENCOUNTER — Ambulatory Visit (HOSPITAL_COMMUNITY): Payer: Medicare Other

## 2016-09-14 DIAGNOSIS — Z86718 Personal history of other venous thrombosis and embolism: Secondary | ICD-10-CM | POA: Insufficient documentation

## 2016-09-14 DIAGNOSIS — G8929 Other chronic pain: Secondary | ICD-10-CM | POA: Insufficient documentation

## 2016-09-14 DIAGNOSIS — F17210 Nicotine dependence, cigarettes, uncomplicated: Secondary | ICD-10-CM | POA: Diagnosis not present

## 2016-09-14 DIAGNOSIS — K219 Gastro-esophageal reflux disease without esophagitis: Secondary | ICD-10-CM | POA: Diagnosis not present

## 2016-09-14 DIAGNOSIS — R918 Other nonspecific abnormal finding of lung field: Secondary | ICD-10-CM | POA: Diagnosis not present

## 2016-09-14 DIAGNOSIS — F419 Anxiety disorder, unspecified: Secondary | ICD-10-CM | POA: Insufficient documentation

## 2016-09-14 DIAGNOSIS — C3432 Malignant neoplasm of lower lobe, left bronchus or lung: Secondary | ICD-10-CM | POA: Diagnosis not present

## 2016-09-14 DIAGNOSIS — Z87891 Personal history of nicotine dependence: Secondary | ICD-10-CM | POA: Insufficient documentation

## 2016-09-14 DIAGNOSIS — Z01818 Encounter for other preprocedural examination: Secondary | ICD-10-CM | POA: Diagnosis not present

## 2016-09-14 DIAGNOSIS — R222 Localized swelling, mass and lump, trunk: Secondary | ICD-10-CM | POA: Diagnosis not present

## 2016-09-14 DIAGNOSIS — M549 Dorsalgia, unspecified: Secondary | ICD-10-CM | POA: Insufficient documentation

## 2016-09-14 DIAGNOSIS — Z7901 Long term (current) use of anticoagulants: Secondary | ICD-10-CM | POA: Insufficient documentation

## 2016-09-14 DIAGNOSIS — I480 Paroxysmal atrial fibrillation: Secondary | ICD-10-CM | POA: Diagnosis not present

## 2016-09-14 DIAGNOSIS — Z86711 Personal history of pulmonary embolism: Secondary | ICD-10-CM | POA: Insufficient documentation

## 2016-09-14 DIAGNOSIS — M1990 Unspecified osteoarthritis, unspecified site: Secondary | ICD-10-CM | POA: Insufficient documentation

## 2016-09-14 DIAGNOSIS — Z6841 Body Mass Index (BMI) 40.0 and over, adult: Secondary | ICD-10-CM | POA: Diagnosis not present

## 2016-09-14 DIAGNOSIS — C3402 Malignant neoplasm of left main bronchus: Secondary | ICD-10-CM | POA: Diagnosis not present

## 2016-09-14 DIAGNOSIS — J9811 Atelectasis: Secondary | ICD-10-CM | POA: Diagnosis not present

## 2016-09-14 DIAGNOSIS — G4733 Obstructive sleep apnea (adult) (pediatric): Secondary | ICD-10-CM | POA: Diagnosis not present

## 2016-09-14 DIAGNOSIS — E876 Hypokalemia: Secondary | ICD-10-CM | POA: Diagnosis not present

## 2016-09-14 HISTORY — DX: Obesity, unspecified: E66.9

## 2016-09-14 HISTORY — PX: VIDEO BRONCHOSCOPY WITH ENDOBRONCHIAL ULTRASOUND: SHX6177

## 2016-09-14 HISTORY — DX: Other specified postprocedural states: Z98.890

## 2016-09-14 HISTORY — DX: Nausea with vomiting, unspecified: R11.2

## 2016-09-14 LAB — PULMONARY FUNCTION TEST
DL/VA % pred: 65 %
DL/VA: 3.24 ml/min/mmHg/L
DLCO COR % PRED: 45 %
DLCO COR: 11.6 ml/min/mmHg
DLCO unc % pred: 45 %
DLCO unc: 11.6 ml/min/mmHg
FEF 25-75 POST: 0.92 L/s
FEF 25-75 Pre: 0.81 L/sec
FEF2575-%Change-Post: 14 %
FEF2575-%PRED-POST: 42 %
FEF2575-%Pred-Pre: 37 %
FEV1-%CHANGE-POST: 4 %
FEV1-%PRED-POST: 60 %
FEV1-%Pred-Pre: 58 %
FEV1-Post: 1.51 L
FEV1-Pre: 1.45 L
FEV1FVC-%Change-Post: -4 %
FEV1FVC-%PRED-PRE: 78 %
FEV6-%Change-Post: 8 %
FEV6-%Pred-Post: 82 %
FEV6-%Pred-Pre: 76 %
FEV6-Post: 2.6 L
FEV6-Pre: 2.39 L
FEV6FVC-%Change-Post: 0 %
FEV6FVC-%Pred-Post: 103 %
FEV6FVC-%Pred-Pre: 104 %
FVC-%Change-Post: 9 %
FVC-%PRED-POST: 79 %
FVC-%PRED-PRE: 73 %
FVC-Post: 2.62 L
FVC-Pre: 2.39 L
POST FEV1/FVC RATIO: 58 %
PRE FEV6/FVC RATIO: 100 %
Post FEV6/FVC ratio: 100 %
Pre FEV1/FVC ratio: 61 %
RV % pred: 86 %
RV: 1.86 L
TLC % pred: 89 %
TLC: 4.64 L

## 2016-09-14 LAB — COMPREHENSIVE METABOLIC PANEL
ALBUMIN: 3.2 g/dL — AB (ref 3.5–5.0)
ALK PHOS: 115 U/L (ref 38–126)
ALT: 28 U/L (ref 14–54)
AST: 39 U/L (ref 15–41)
Anion gap: 11 (ref 5–15)
BUN: 5 mg/dL — AB (ref 6–20)
CHLORIDE: 100 mmol/L — AB (ref 101–111)
CO2: 28 mmol/L (ref 22–32)
CREATININE: 0.85 mg/dL (ref 0.44–1.00)
Calcium: 9 mg/dL (ref 8.9–10.3)
GFR calc non Af Amer: >60 mL/min (ref >60)
GLUCOSE: 106 mg/dL — AB (ref 65–99)
Potassium: 3.3 mmol/L — ABNORMAL LOW (ref 3.5–5.1)
SODIUM: 139 mmol/L (ref 135–145)
Total Bilirubin: 0.5 mg/dL (ref 0.3–1.2)
Total Protein: 7.2 g/dL (ref 6.5–8.1)

## 2016-09-14 LAB — CBC
HCT: 40.8 % (ref 36.0–46.0)
HEMOGLOBIN: 12.8 g/dL (ref 12.0–15.0)
MCH: 27 pg (ref 26.0–34.0)
MCHC: 31.4 g/dL (ref 30.0–36.0)
MCV: 86.1 fL (ref 78.0–100.0)
Platelets: 305 10*3/uL (ref 150–400)
RBC: 4.74 MIL/uL (ref 3.87–5.11)
RDW: 15.8 % — ABNORMAL HIGH (ref 11.5–15.5)
WBC: 8.7 10*3/uL (ref 4.0–10.5)

## 2016-09-14 LAB — APTT: aPTT: 30 seconds (ref 24–36)

## 2016-09-14 LAB — PROTIME-INR
INR: 0.99
Prothrombin Time: 13.1 seconds (ref 11.4–15.2)

## 2016-09-14 SURGERY — BRONCHOSCOPY, WITH EBUS
Anesthesia: General

## 2016-09-14 MED ORDER — ROCURONIUM BROMIDE 10 MG/ML (PF) SYRINGE
PREFILLED_SYRINGE | INTRAVENOUS | Status: AC
  Filled 2016-09-14: qty 5

## 2016-09-14 MED ORDER — 0.9 % SODIUM CHLORIDE (POUR BTL) OPTIME
TOPICAL | Status: DC | PRN
  Administered 2016-09-14: 1000 mL

## 2016-09-14 MED ORDER — MIDAZOLAM HCL 5 MG/5ML IJ SOLN
INTRAMUSCULAR | Status: DC | PRN
  Administered 2016-09-14: 2 mg via INTRAVENOUS

## 2016-09-14 MED ORDER — FENTANYL CITRATE (PF) 100 MCG/2ML IJ SOLN: 25.0000 ug | INTRAMUSCULAR | Status: DC | PRN

## 2016-09-14 MED ORDER — LIDOCAINE 2% (20 MG/ML) 5 ML SYRINGE
INTRAMUSCULAR | Status: AC
  Filled 2016-09-14: qty 5

## 2016-09-14 MED ORDER — OXYCODONE HCL 5 MG PO TABS
5.0000 mg | ORAL_TABLET | Freq: Once | ORAL | Status: AC | PRN
  Administered 2016-09-14: 5 mg via ORAL

## 2016-09-14 MED ORDER — ROCURONIUM BROMIDE 100 MG/10ML IV SOLN
INTRAVENOUS | Status: DC | PRN
  Administered 2016-09-14: 40 mg via INTRAVENOUS

## 2016-09-14 MED ORDER — MIDAZOLAM HCL 2 MG/2ML IJ SOLN
INTRAMUSCULAR | Status: AC
  Filled 2016-09-14: qty 2

## 2016-09-14 MED ORDER — FENTANYL CITRATE (PF) 250 MCG/5ML IJ SOLN
INTRAMUSCULAR | Status: AC
  Filled 2016-09-14: qty 5

## 2016-09-14 MED ORDER — SUGAMMADEX SODIUM 200 MG/2ML IV SOLN
INTRAVENOUS | Status: AC
  Filled 2016-09-14: qty 4

## 2016-09-14 MED ORDER — LIDOCAINE HCL 4 % EX SOLN
CUTANEOUS | Status: DC | PRN
  Administered 2016-09-14: 4 mL via TOPICAL

## 2016-09-14 MED ORDER — ONDANSETRON HCL 4 MG/2ML IJ SOLN
INTRAMUSCULAR | Status: DC | PRN
  Administered 2016-09-14: 4 mg via INTRAVENOUS

## 2016-09-14 MED ORDER — FENTANYL CITRATE (PF) 250 MCG/5ML IJ SOLN
INTRAMUSCULAR | Status: DC | PRN
  Administered 2016-09-14 (×3): 50 ug via INTRAVENOUS

## 2016-09-14 MED ORDER — OXYCODONE HCL 5 MG/5ML PO SOLN: 5.0000 mg | Freq: Once | ORAL | Status: AC | PRN

## 2016-09-14 MED ORDER — LIDOCAINE HCL (PF) 2 % IJ SOLN
INTRAMUSCULAR | Status: DC | PRN
  Administered 2016-09-14: 60 mg via INTRADERMAL

## 2016-09-14 MED ORDER — LIDOCAINE HCL (CARDIAC) 20 MG/ML IV SOLN
INTRAVENOUS | Status: DC | PRN
  Administered 2016-09-14: 20 mg via INTRATRACHEAL

## 2016-09-14 MED ORDER — EPINEPHRINE PF 1 MG/ML IJ SOLN
INTRAMUSCULAR | Status: AC
  Filled 2016-09-14: qty 1

## 2016-09-14 MED ORDER — OXYCODONE HCL 5 MG PO TABS
ORAL_TABLET | ORAL | Status: AC
  Filled 2016-09-14: qty 1

## 2016-09-14 MED ORDER — SUCCINYLCHOLINE CHLORIDE 20 MG/ML IJ SOLN
INTRAMUSCULAR | Status: DC | PRN
  Administered 2016-09-14: 120 mg via INTRAVENOUS

## 2016-09-14 MED ORDER — EPINEPHRINE PF 1 MG/ML IJ SOLN
INTRAMUSCULAR | Status: DC | PRN
  Administered 2016-09-14: 1 mg

## 2016-09-14 MED ORDER — SUGAMMADEX SODIUM 200 MG/2ML IV SOLN
INTRAVENOUS | Status: DC | PRN
  Administered 2016-09-14: 300 mg via INTRAVENOUS

## 2016-09-14 MED ORDER — PROPOFOL 10 MG/ML IV BOLUS
INTRAVENOUS | Status: AC
  Filled 2016-09-14: qty 20

## 2016-09-14 MED ORDER — PHENYLEPHRINE HCL 10 MG/ML IJ SOLN
INTRAMUSCULAR | Status: DC | PRN
  Administered 2016-09-14: 30 ug/min via INTRAVENOUS

## 2016-09-14 MED ORDER — LACTATED RINGERS IV SOLN
INTRAVENOUS | Status: DC
  Administered 2016-09-14 (×2): via INTRAVENOUS

## 2016-09-14 MED ORDER — PROPOFOL 10 MG/ML IV BOLUS
INTRAVENOUS | Status: DC | PRN
  Administered 2016-09-14: 130 mg via INTRAVENOUS

## 2016-09-14 SURGICAL SUPPLY — 31 items
BRUSH CYTOL CELLEBRITY 1.5X140 (MISCELLANEOUS) ×3 IMPLANT
CANISTER SUCT 3000ML PPV (MISCELLANEOUS) ×3 IMPLANT
CONT SPEC 4OZ CLIKSEAL STRL BL (MISCELLANEOUS) ×3 IMPLANT
COTTONBALL LRG STERILE PKG (GAUZE/BANDAGES/DRESSINGS) IMPLANT
COVER BACK TABLE 60X90IN (DRAPES) ×3 IMPLANT
COVER DOME SNAP 22 D (MISCELLANEOUS) ×3 IMPLANT
FILTER STRAW FLUID ASPIR (MISCELLANEOUS) IMPLANT
FORCEPS BIOP RJ4 1.8 (CUTTING FORCEPS) ×3 IMPLANT
GAUZE SPONGE 4X4 12PLY STRL LF (GAUZE/BANDAGES/DRESSINGS) ×3 IMPLANT
GLOVE SURG SIGNA 7.5 PF LTX (GLOVE) ×3 IMPLANT
GOWN STRL REUS W/ TWL XL LVL3 (GOWN DISPOSABLE) ×1 IMPLANT
GOWN STRL REUS W/TWL XL LVL3 (GOWN DISPOSABLE) ×2
KIT CLEAN ENDO COMPLIANCE (KITS) ×6 IMPLANT
KIT ROOM TURNOVER OR (KITS) ×3 IMPLANT
MARKER SKIN DUAL TIP RULER LAB (MISCELLANEOUS) ×3 IMPLANT
NEEDLE 22X1 1/2 (OR ONLY) (NEEDLE) IMPLANT
NEEDLE BLUNT 18X1 FOR OR ONLY (NEEDLE) IMPLANT
NEEDLE EBUS SONO TIP PENTAX (NEEDLE) ×3 IMPLANT
NS IRRIG 1000ML POUR BTL (IV SOLUTION) ×3 IMPLANT
OIL SILICONE PENTAX (PARTS (SERVICE/REPAIRS)) ×3 IMPLANT
PAD ARMBOARD 7.5X6 YLW CONV (MISCELLANEOUS) ×6 IMPLANT
SYR 20CC LL (SYRINGE) ×3 IMPLANT
SYR 20ML ECCENTRIC (SYRINGE) ×6 IMPLANT
SYR 5ML LL (SYRINGE) ×3 IMPLANT
SYR 5ML LUER SLIP (SYRINGE) ×3 IMPLANT
SYR CONTROL 10ML LL (SYRINGE) IMPLANT
TOWEL OR 17X24 6PK STRL BLUE (TOWEL DISPOSABLE) ×3 IMPLANT
TRAP SPECIMEN MUCOUS 40CC (MISCELLANEOUS) ×3 IMPLANT
TUBE CONNECTING 20'X1/4 (TUBING) ×1
TUBE CONNECTING 20X1/4 (TUBING) ×2 IMPLANT
WATER STERILE IRR 1000ML POUR (IV SOLUTION) ×3 IMPLANT

## 2016-09-14 NOTE — Op Note (Signed)
NAMEMARIKO, [name]               ACCOUNT NO.:  [id number]  MEDICAL RECORD NO.:  [id number]  LOCATION:                                 FACILITY:  PHYSICIAN:  Revonda Standard. Roxan Hockey, M.D. DATE OF BIRTH:  DATE OF PROCEDURE:  09/14/2016 DATE OF DISCHARGE:                              OPERATIVE REPORT   PREOPERATIVE DIAGNOSIS:  Left lower lobe mass.  POSTOPERATIVE DIAGNOSIS:  Malignancy, left lower lobe.  PROCEDURE:  Bronchoscopy with brushings and biopsies and endobronchial ultrasound.  SURGEON:  Revonda Standard. Roxan Hockey, M.D.  ASSISTANT:  None.  ANESTHESIA:  General.  FINDINGS:  Left lower lobe bronchus completely occluded with tumor.  Edema around the left mainstem carina. Quick-prep on the brushings showed malignancy. Mediastinal lymph node aspirations were nondiagnostic.  CLINICAL NOTE:  Sarah [name] is a 66 year old woman with a history of tobacco abuse amongst other significant medical problems, who presented with pain in her left upper quadrant.  During her evaluation, a chest x- ray was done, which showed a lung mass.  This was a 7.5-cm mass on CT. PET-CT showed the mass was markedly hypermetabolic.  She was advised to undergo bronchoscopy and endobronchial ultrasound for diagnostic and staging purposes.  The indications, risks, benefits and alternatives were discussed in detail with the patient.  She understood and accepted the risks and agreed to proceed.  OPERATIVE NOTE:  Sarah [name] was brought to the operating room on Sep 14, 2016.  She had induction of general anesthesia and was intubated.  A time-out was performed.  Flexible fiberoptic bronchoscopy was performed via the endotracheal tube.  The trachea and right bronchial tree were normal to the level of the subsegmental bronchi.  The left mainstem bronchus was normal proximally.  At the left mainstem carina, there was edema and then tumor mass was completely obstructing the left lower lobe bronchus.  The left  upper lobe was normal to the level of the subsegmental bronchi.  The bronchoscope was removed.  The endobronchial ultrasound probe was advanced.  Systematic inspection of the mediastinal lymph nodes showed a relatively enlarged level 7 subcarinal node.  Aspirations were performed of this node.  With each aspiration, the needle was advanced into the node with ultrasound guidance and 10-12 passes were made in the node.  There was bleeding from the bronchus after aspirating the lymph node. This process was repeated three times.  Next, a small 4R node was visible.  Aspirations were performed there. This was technically difficult.  Two aspirations were performed of this node.  The endobronchial ultrasound probe then was advanced further and an additional node was identified in the subcarinal space.  Aspirations were performed of this node as well. These specimens were sent to Pathology.  The endobronchial ultrasound probe was removed.    While awaiting the results of the node aspirations, the bronchoscope was replaced.  Brushings were performed from the left lower lobe bronchus and sent to Pathology.  Those brushings returned positive for malignancy.  The needle aspirations of the mediastinal nodes were nondiagnostic.  Multiple biopsies were obtained.  There was significant bleeding with the biopsies.  Dilute epinephrine was applied topically, which slowed the bleeding.  The biopsies  will be sent for permanent pathology.  A final inspection was made with the bronchoscope, there was no ongoing bleeding.  The bronchoscope was removed.  The patient was extubated in the operating room and taken to the postanesthetic care unit in good condition.     Revonda Standard Roxan Hockey, M.D.     SCH/MEDQ  D:  09/14/2016  T:  09/14/2016  Job:  [id number]

## 2016-09-14 NOTE — Brief Op Note (Signed)
09/14/2016  2:27 PM  PATIENT:  Sarah [name]  66 y.o. female  PRE-OPERATIVE DIAGNOSIS:  Left Lower Lobe Mass  POST-OPERATIVE DIAGNOSIS:  Left Lower Lobe Mass-   PROCEDURE:  Procedure(s): VIDEO BRONCHOSCOPY WITH ENDOBRONCHIAL ULTRASOUND (N/A)  SURGEON:  Surgeon(s) and Role:    * Melrose Nakayama, MD - Primary  ASSISTANTS: none   ANESTHESIA:   general  EBL:  Total I/O In: 1000 [I.V.:1000] Out: 25 [Blood:25]  BLOOD ADMINISTERED:none  DRAINS: none   LOCAL MEDICATIONS USED:  NONE  SPECIMEN:  Source of Specimen:  mediastinal lymph nodes and left lower lobe mass  DISPOSITION OF SPECIMEN:  PATHOLOGY  PLAN OF CARE: Discharge to home after PACU  PATIENT DISPOSITION:  PACU - hemodynamically stable.   Delay start of Pharmacological VTE agent (>24hrs) due to surgical blood loss or risk of bleeding: not applicable

## 2016-09-14 NOTE — Transfer of Care (Signed)
Immediate Anesthesia Transfer of Care Note  Patient: Sarah [name]  Procedure(s) Performed: Procedure(s): VIDEO BRONCHOSCOPY WITH ENDOBRONCHIAL ULTRASOUND (N/A)  Patient Location: PACU  Anesthesia Type:General  Level of Consciousness: awake, alert  and oriented  Airway & Oxygen Therapy: Patient Spontanous Breathing and Patient connected to nasal cannula oxygen  Post-op Assessment: Report given to RN and Post -op Vital signs reviewed and stable  Post vital signs: Reviewed and stable  Last Vitals:  Vitals:   09/14/16 0912 09/14/16 1358  BP: (!) 144/52 (!) (P) 144/47  Pulse: 72 78  Resp: 20 (!) 23  Temp: 36.6 C 36.4 C    Last Pain:  Vitals:   09/14/16 1358  TempSrc:   PainSc: (P) 0-No pain      Patients Stated Pain Goal: 4 (94/44/61 [id number])  Complications: No apparent anesthesia complications

## 2016-09-14 NOTE — Anesthesia Preprocedure Evaluation (Signed)
Anesthesia Evaluation  Patient identified by MRN, date of birth, ID band Patient awake    Reviewed: Allergy & Precautions, NPO status , Patient's Chart, lab work & pertinent test results  History of Anesthesia Complications (+) PONV and history of anesthetic complications  Airway Mallampati: II  TM Distance: >3 FB Neck ROM: Full    Dental  (+) Teeth Intact   Pulmonary sleep apnea and Continuous Positive Airway Pressure Ventilation , former smoker,    breath sounds clear to auscultation       Cardiovascular  Rhythm:Regular     Neuro/Psych  Headaches, Anxiety    GI/Hepatic Neg liver ROS, GERD  Medicated and Controlled,  Endo/Other  Morbid obesity  Renal/GU negative Renal ROS     Musculoskeletal  (+) Arthritis ,   Abdominal   Peds  Hematology negative hematology ROS (+)   Anesthesia Other Findings Palpitations on metop, chronic antiocoag for blood clots  Reproductive/Obstetrics                             Anesthesia Physical Anesthesia Plan  ASA: III  Anesthesia Plan: General   Post-op Pain Management:    Induction: Intravenous  Airway Management Planned: Oral ETT  Additional Equipment: None  Intra-op Plan:   Post-operative Plan: Extubation in OR  Informed Consent: I have reviewed the patients History and Physical, chart, labs and discussed the procedure including the risks, benefits and alternatives for the proposed anesthesia with the patient or authorized representative who has indicated his/her understanding and acceptance.   Dental advisory given  Plan Discussed with: CRNA and Surgeon  Anesthesia Plan Comments:         Anesthesia Quick Evaluation

## 2016-09-14 NOTE — Discharge Instructions (Addendum)
° °  Community-Acquired Pneumonia, Adult Pneumonia is an infection of the lungs. One type of pneumonia can happen while a person is in a hospital. A different type can happen when a person is not in a hospital (community-acquired pneumonia). It is easy for this kind to spread from person to person. It can spread to you if you breathe near an infected person who coughs or sneezes. Some symptoms include:  A dry cough.  A wet (productive) cough.  Fever.  Sweating.  Chest pain. Follow these instructions at home:  Take over-the-counter and prescription medicines only as told by your doctor.  Only take cough medicine if you are losing sleep.  If you were prescribed an antibiotic medicine, take it as told by your doctor. Do not stop taking the antibiotic even if you start to feel better.  Sleep with your head and neck raised (elevated). You can do this by putting a few pillows under your head, or you can sleep in a recliner.  Do not use tobacco products. These include cigarettes, chewing tobacco, and e-cigarettes. If you need help quitting, ask your doctor.  Drink enough water to keep your pee (urine) clear or pale yellow. A shot (vaccine) can help prevent pneumonia. Shots are often suggested for:  People older than 66 years of age.  People older than 66 years of age:  Who are having cancer treatment.  Who have long-term (chronic) lung disease.  Who have problems with their body's defense system (immune system). You may also prevent pneumonia if you take these actions:  Get the flu (influenza) shot every year.  Go to the dentist as often as told.  Wash your hands often. If soap and water are not available, use hand sanitizer. Contact a doctor if:  You have a fever.  You lose sleep because your cough medicine does not help. Get help right away if:  You are short of breath and it gets worse.  You have more chest pain.  Your sickness gets worse. This is very serious  if:  You are an older adult.  Your body's defense system is weak.  You cough up blood. This information is not intended to replace advice given to you by your health care provider. Make sure you discuss any questions you have with your health care provider. Document Released: 10/04/2007 Document Revised: 09/23/2015 Document Reviewed: 08/12/2014 Elsevier Interactive Patient Education  2017 [place] not drive or engage in heavy physical activity for 24 hours  You may resume normal activities tomorrow  You may cough up small amounts of blood over the next few days.   You may use over the counter cough medications or throat lozenges if needed.  You may use the hydrocodone you already have if needed for discomfort  Resume lovenox and coumadin on Saturday 5/19  Call [phone] if you develop chest pain, shortness of breath or cough up more than 2 tablespoons of blood  My office will contact you with follow up information

## 2016-09-14 NOTE — Anesthesia Procedure Notes (Signed)
Procedure Name: Intubation Date/Time: 09/14/2016 12:41 PM Performed by: Mariea Clonts Pre-anesthesia Checklist: Patient identified, Emergency Drugs available, Suction available and Patient being monitored Patient Re-evaluated:Patient Re-evaluated prior to inductionOxygen Delivery Method: Circle System Utilized Preoxygenation: Pre-oxygenation with 100% oxygen Intubation Type: IV induction Ventilation: Mask ventilation without difficulty Laryngoscope Size: Miller and 2 Grade View: Grade II Tube type: Oral Tube size: 8.5 mm Number of attempts: 1 Airway Equipment and Method: Stylet and Oral airway Placement Confirmation: ETT inserted through vocal cords under direct vision,  positive ETCO2 and breath sounds checked- equal and bilateral Secured at: 24 cm Tube secured with: Tape Dental Injury: Teeth and Oropharynx as per pre-operative assessment

## 2016-09-15 ENCOUNTER — Encounter (HOSPITAL_COMMUNITY): Payer: Self-pay | Admitting: Thoracic Surgery (Cardiothoracic Vascular Surgery)

## 2016-09-15 ENCOUNTER — Telehealth: Payer: Self-pay | Admitting: *Deleted

## 2016-09-15 ENCOUNTER — Telehealth: Payer: Self-pay | Admitting: Family Medicine

## 2016-09-15 DIAGNOSIS — R918 Other nonspecific abnormal finding of lung field: Secondary | ICD-10-CM | POA: Insufficient documentation

## 2016-09-15 NOTE — Telephone Encounter (Signed)
Patient said she is starting her Lovenox and coumadin back tomorrow.  She said Dr. Richardson Landry asked her to call and notify him.

## 2016-09-15 NOTE — H&P (Signed)
PCP is Luking, Grace Bushy, MD Referring Provider is [name], MD      Chief Complaint  Patient presents with  . Lung Mass    Surgical eval, Chest CT 08/30/16    HPI: 66 yo woman sent for consultation re: left lower lobe mass  Sarah [name] is a 66 yo woman who has a past history of morbid obesity, ongoing tobacco abuse (100 py/ 1 ppd currently), paroxysmal atrial fibrillation, DVT, PE, chronic venous stasis, OSA requiring CPAP, GERD, chronic back pain and anxiety. She recently developed left upper quadrant pain. She described this as a sharp pain that begins in her left upper quadrant just below her costal margin and radiates to the back. As part of her evaluation a chest x-ray was done. It showed a left lung mass. A CT of the chest was done, which showed a 7.5 cm left lower lobe mass with partial left lower lobe atelectasis. There were some prominent but are not pathologically enlarged mediastinal lymph nodes. The left lower lobe bronchus did appear to be completely occluded. There was an adrenal mass on the right, but it was felt to be a possible adenoma.  Her physical activities are relatively limited. She has morbid obesity and chronic back pain. She also has chronic venous stasis of her legs due to previous DVT. 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 She started smoking at age 66 and smoked 2 packs per day for most of her adult life. She currently is smoking a pack a day. She denies any chest pain, pressure, or tightness other than the left upper quadrant pain noted above. She does get short of breath with walking up any incline. She's had a dry cough. Denies hemoptysis. She has noted some mild wheezing recently.  Zubrod Score: At the time of surgery this patient's most appropriate activity status/level should be described as: '[]'$     0    Normal activity, no symptoms '[x]'$     1    Restricted in physical strenuous activity but ambulatory, able to do out light  work '[]'$     2    Ambulatory and capable of self care, unable to do work activities, up and about >50 % of waking hours                              '[]'$     3    Only limited self care, in bed greater than 50% of waking hours '[]'$     4    Completely disabled, no self care, confined to bed or chair '[]'$     5    Moribund      Past Medical History:  Diagnosis Date  . Arthritis   . Chronic anxiety    Patient describes as stress  . Chronic back pain   . DVT (deep venous thrombosis) ([place]) age 66  . GERD (gastroesophageal reflux disease)   . Headache(784.0)   . Hypoestrogenism   . Lower extremity venous stasis    LLE, chronic  . Paroxysmal atrial fibrillation ([facility])   . Pulmonary embolism ([place])   . Reflux   . Sleep apnea   . Spinal stenosis   . Tobacco abuse   . Venous stasis   . Warfarin anticoagulation          Past Surgical History:  Procedure Laterality Date  . ANKLE SURGERY     x2  . APPENDECTOMY    .  CARPAL TUNNEL RELEASE    . CHOLECYSTECTOMY    . OVARIAN CYST REMOVAL    . ROTATOR CUFF REPAIR    . TUBAL LIGATION           Family History  Problem Relation Age of Onset  . Arrhythmia Mother   . Cancer Mother        colon  . Heart disease Mother   . Stroke Father        Deceased    Social History       Social History  Substance Use Topics  . Smoking status: Current Every Day Smoker    Packs/day: 1.00    Years: 40.00    Types: Cigarettes  . Smokeless tobacco: Never Used     Comment: smokes about 2 cigarettes a day now.   . Alcohol use No          Current Outpatient Prescriptions  Medication Sig Dispense Refill  . buPROPion (WELLBUTRIN SR) 150 MG 12 hr tablet TAKE 1 TABLET BY MOUTH TWICE DAILY **KEEP APPT ON 06/09/16** 180 tablet 3  . calcium carbonate (TUMS - DOSED IN MG ELEMENTAL CALCIUM) 500 MG chewable tablet Chew 1 tablet by mouth 3 (three) times daily as needed for heartburn.    . diltiazem  (CARDIZEM CD) 180 MG 24 hr capsule TAKE ONE CAPSULE BY MOUTH EVERY DAY 90 capsule 1  . flecainide (TAMBOCOR) 50 MG tablet TAKE 1 TABLET (50 MG TOTAL) BY MOUTH 2 (TWO) TIMES DAILY. 60 tablet 11  . HYDROcodone-acetaminophen (NORCO) 7.5-325 MG tablet One tablet up to four times a day as needed for pain 120 tablet 0  . metoprolol tartrate (LOPRESSOR) 25 MG tablet 1 tablet as needed for heart racing 10 tablet 5  . pantoprazole (PROTONIX) 40 MG tablet TAKE 1 TABLET EVERY MORNING 90 tablet 1  . sucralfate (CARAFATE) 1 g tablet Take 1 tablet (1 g total) by mouth 2 (two) times daily. 60 tablet 5  . warfarin (COUMADIN) 5 MG tablet TAKE 1 TABLET BY MOUTH EVERY DAY AS DIRECTED (Patient taking differently: 2.5 mg daily) 90 tablet 1   No current facility-administered medications for this visit.         Allergies  Allergen Reactions  . Aspirin Nausea And Vomiting    Review of Systems  Constitutional: Negative for activity change, chills, fever and unexpected weight change.  HENT: Negative for trouble swallowing and voice change.   Eyes: Negative for visual disturbance.  Respiratory: Positive for cough, shortness of breath and wheezing.   Cardiovascular: Positive for leg swelling. Negative for chest pain and palpitations.  Gastrointestinal: Positive for abdominal pain (LUQ). Negative for blood in stool.  Genitourinary: Negative for dysuria and hematuria.  Musculoskeletal: Positive for arthralgias and joint swelling.  Neurological: Negative for dizziness, seizures, syncope and weakness.  Hematological: Negative for adenopathy. Bruises/bleeds easily (Coumadin).  All other systems reviewed and are negative.   BP (!) 144/81   Pulse 68   Resp 20   Ht '5\' 5"'$  (1.651 m)   Wt 256 lb (116.1 kg)   SpO2 93% Comment: RA  BMI 42.60 kg/m  Physical Exam  Constitutional: She is oriented to person, place, and time. She appears well-developed and well-nourished. No distress.  Morbidly obese  HENT:   Head: Normocephalic and atraumatic.  Mouth/Throat: No oropharyngeal exudate.  Eyes: Conjunctivae and EOM are normal. Pupils are equal, round, and reactive to light. No scleral icterus.  Neck: Neck supple. No thyromegaly present.  Cardiovascular: Normal rate, regular  rhythm and normal heart sounds.   No murmur heard. Pulmonary/Chest: No respiratory distress. She has wheezes (left > right).  Abdominal: Soft. She exhibits no distension. There is no tenderness.  Musculoskeletal: She exhibits edema and deformity (chronic venous stasis changes on left).  Lymphadenopathy:    She has no cervical adenopathy.  Neurological: She is alert and oriented to person, place, and time. She exhibits normal muscle tone. Coordination normal.  Skin: Skin is warm and dry.  Vitals reviewed.    Diagnostic Tests: CT CHEST WITH CONTRAST  TECHNIQUE: Multidetector CT imaging of the chest was performed during intravenous contrast administration.  CONTRAST: 85m ISOVUE-300 IOPAMIDOL (ISOVUE-300) INJECTION 61%  COMPARISON: Chest x-ray 08/24/2016.  FINDINGS: Cardiovascular: Heart is normal size. Aorta is normal caliber. Scattered aortic arch calcifications.  Mediastinum/Nodes: Small borderline sized mediastinal lymph nodes. Prevascular lymph nodes have a short axis diameter of 6 mm. Similarly sized subcarinal and right paratracheal lymph nodes. No visible hilar adenopathy although the left hilum is not well visualized due to surrounding soft tissue/mass. No axillary adenopathy.  Lungs/Pleura: Probable large central mass in the left hilum. Associated collapse of the left lower lobe. The central mass is approximately 7.5 x 7.6 cm on image 60. Obstruction of the left lower lobe bronchi noted. Trace right pleural effusion. Linear scarring in the right middle lobe. Right lung otherwise clear. Biapical scarring.  Upper Abdomen: 2.7 cm nodule in the right adrenal gland which is low-density on  this enhanced study and likely reflects adenoma. No acute findings in the upper abdomen.  Musculoskeletal: Chest wall soft tissues are unremarkable. No acute bony abnormality.  IMPRESSION: Large central mass in the central left lung/left lower lobe with obstruction of the left lower lobe bronchi and left lower lobe collapse. Findings most compatible with central left lung cancer. Recommend cardiothoracic consultation and tissue sampling. PET CT may be beneficial to better characterize the central left lung mass.  Trace left pleural effusion.  Low-density mass in the right adrenal gland. Given its relatively low-density on this contrasted study, I favor this represents adenoma, but recommend attention on follow-up imaging and/or PET CT.   Electronically Signed By: KRolm BaptiseM.D. On: 08/31/2016 08:27 I personally reviewed the CT chest and concur with the findings noted above  Impression: Sarah [name] a 66year old woman with multiple medical issues including morbid obesity, sleep apnea, paroxysmal atrial fibrillation, history of DVT and PE, and chronic venous stasis of her lower extremities. She has a long history of heavy tobacco abuse. She initially presented with left upper quadrant pain. During that workup she was found to have a large left lower lobe mass with obstructive atelectasis. Her pain may well be referred pain from pleural irritation.  I long discussion with Sarah [name], her husband, and her daughter. I reviewed the films with them. We discussed the differential diagnosis, which in this case, realistically, is only a new primary bronchogenic carcinoma. We discussed the need for completing diagnostic and staging workup.  I think the best way to establish a diagnosis her case is with bronchoscopy and endobronchial ultrasound for biopsy. The mass itself should be visible on bronchoscopy. They do understand that there is no guarantee a diagnosis will be  made as sometimes large fast-growing tumors will have extensive necrosis. I informed him of the general nature of the procedure. We would plan to do this in the operating room under general anesthesia. We will plan to do it on an outpatient basis. They understand this is endoscopic in  nature. I reviewed the indications, risks, benefits, and alternatives. They understand the risks include those of general anesthesia. They understand procedure specific risks include failure to make a diagnosis, pneumothorax, bleeding, as well as the possibility of other unforeseeable complications such as MI, stroke, blood clot, in rare cases death. She accepts the risks and wishes to proceed.  We also need to do a PET/CT and an MR of the brain to complete her staging workup. We will go ahead and schedule those.  I don't think this will and up being resectable, and would not even consider the possibility of resection if she continues to smoke. Just in case we will check pulmonary function testing to see if that is even an option.  Her Coumadin dose was recently decreased to 2.5 mg a day due to her INR being elevated at 4.4. She currently is holding her Coumadin until she gets her INR rechecked on Monday. I do not want to bridge her with Lovenox with her INR being that elevated. She will follow-up with Dr. Wolfgang Phoenix on Monday after her INR to determine whether she needs to be bridged prior to her procedure on Thursday.  Plan:  PET/CT MR brain PFTs with and without bronchodilators Bronchoscopy and EBUS on Thursday 5/17 Hold coumadin until follow up with Dr. Wolfgang Phoenix on Monday  Melrose Nakayama, MD [facility] [phone]

## 2016-09-15 NOTE — Telephone Encounter (Signed)
Oncology Nurse Navigator Documentation  Oncology Nurse Navigator Flowsheets 09/15/2016  Navigator Location [facility]  Navigator Encounter Type Telephone/I called patient back today.  Again, family answered the phone was extremely rude to me.  He did give the phone to Ms. Nham.  I updated her on appt time and place. She verbalized understanding of appt.  I asked that she call me with any questions. I mailed [place] letter.   Telephone Outgoing Call  Treatment Phase Abnormal Scans  Barriers/Navigation Needs Coordination of Care  Interventions Coordination of Care  Coordination of Care Appts  Acuity Level 1  Time Spent with Patient 30

## 2016-09-15 NOTE — Telephone Encounter (Signed)
Nurse visit for INR scheduled for Monday 09/18/16

## 2016-09-15 NOTE — Telephone Encounter (Signed)
Oncology Nurse Navigator Documentation  Oncology Nurse Navigator Flowsheets 09/15/2016  Navigator Location [facility]  Referral date to RadOnc/MedOnc 09/12/2016  Navigator Encounter Type Telephone/I received referral from Dr. Roxan Hockey.  I called patient to schedule her for [place].  Family answered the phone.  I asked to speak to Ms. Pugason.  I was told "no I could not".  I asked if I could call another number and I was told "no I can not".  I reached out to [facility] site manager to update her.   Telephone Outgoing Call  Treatment Phase Abnormal Scans  Barriers/Navigation Needs Coordination of Care  Interventions Coordination of Care  Coordination of Care Appts  Acuity Level 1  Time Spent with Patient 30

## 2016-09-15 NOTE — Telephone Encounter (Signed)
Ok inr on monday

## 2016-09-15 NOTE — Anesthesia Postprocedure Evaluation (Addendum)
Anesthesia Post Note  Patient: Sarah [name]  Procedure(s) Performed: Procedure(s) (LRB): VIDEO BRONCHOSCOPY WITH ENDOBRONCHIAL ULTRASOUND (N/A)  Patient location during evaluation: PACU Anesthesia Type: General Level of consciousness: awake and alert Pain management: pain level controlled Vital Signs Assessment: post-procedure vital signs reviewed and stable Respiratory status: spontaneous breathing, nonlabored ventilation, respiratory function stable and patient connected to nasal cannula oxygen Cardiovascular status: blood pressure returned to baseline and stable Postop Assessment: no signs of nausea or vomiting Anesthetic complications: no       Last Vitals:  Vitals:   09/14/16 1530 09/14/16 1545  BP: (!) 123/44   Pulse: 76 75  Resp: 15 16  Temp:  36.4 C    Last Pain:  Vitals:   09/14/16 1530  TempSrc:   PainSc: 6                  Vertie Dibbern

## 2016-09-18 ENCOUNTER — Ambulatory Visit (INDEPENDENT_AMBULATORY_CARE_PROVIDER_SITE_OTHER): Payer: Medicare Other

## 2016-09-18 DIAGNOSIS — Z7901 Long term (current) use of anticoagulants: Secondary | ICD-10-CM | POA: Diagnosis not present

## 2016-09-18 LAB — POCT INR: INR: 1.1

## 2016-09-18 MED ORDER — ONDANSETRON 4 MG PO TBDP: ORAL_TABLET | ORAL | 0 refills | Status: DC

## 2016-09-18 NOTE — Patient Instructions (Signed)
Take 1 tablet ('5MG'$ ) on Monday, and Wednesday, and 1/2 on Tuesday and Thursday. Recheck on Friday

## 2016-09-19 ENCOUNTER — Other Ambulatory Visit: Payer: Self-pay | Admitting: *Deleted

## 2016-09-19 ENCOUNTER — Telehealth: Payer: Self-pay | Admitting: Family Medicine

## 2016-09-19 MED ORDER — ENOXAPARIN SODIUM 120 MG/0.8ML ~~LOC~~ SOLN: SUBCUTANEOUS | 0 refills | Status: DC

## 2016-09-19 NOTE — Telephone Encounter (Signed)
Let's do 

## 2016-09-19 NOTE — Telephone Encounter (Signed)
rx sent to pharm. Pt notified.  

## 2016-09-19 NOTE — Telephone Encounter (Signed)
Patient was seen on 09/18/16 for her INR.  She was told to continue taking her Lovenox until Friday when she is seen.  She said she doesn't have enough to last until then.  She is lacking 4 and would like Rx called in.     [facility]

## 2016-09-20 ENCOUNTER — Telehealth: Payer: Self-pay | Admitting: *Deleted

## 2016-09-20 DIAGNOSIS — C3492 Malignant neoplasm of unspecified part of left bronchus or lung: Secondary | ICD-10-CM | POA: Insufficient documentation

## 2016-09-20 NOTE — Telephone Encounter (Signed)
Called pt and confirmed 09/21/16 clinic appt w/ her.

## 2016-09-21 ENCOUNTER — Ambulatory Visit (HOSPITAL_BASED_OUTPATIENT_CLINIC_OR_DEPARTMENT_OTHER): Payer: Medicare Other | Admitting: Internal Medicine

## 2016-09-21 ENCOUNTER — Encounter: Payer: Self-pay | Admitting: Internal Medicine

## 2016-09-21 ENCOUNTER — Other Ambulatory Visit (HOSPITAL_BASED_OUTPATIENT_CLINIC_OR_DEPARTMENT_OTHER): Payer: Medicare Other

## 2016-09-21 ENCOUNTER — Other Ambulatory Visit: Payer: Medicare Other

## 2016-09-21 ENCOUNTER — Ambulatory Visit
Admission: RE | Admit: 2016-09-21 | Discharge: 2016-09-21 | Disposition: A | Discharge status: Home / Self Care | Payer: Medicare Other | Source: Ambulatory Visit | Attending: Radiation Oncology | Admitting: Radiation Oncology

## 2016-09-21 ENCOUNTER — Encounter: Payer: Self-pay | Admitting: *Deleted

## 2016-09-21 ENCOUNTER — Ambulatory Visit: Payer: Medicare Other | Attending: Internal Medicine | Admitting: Physical Therapy

## 2016-09-21 VITALS — BP 123/53 | HR 62 | Temp 97.7°F | Resp 18 | Ht 66.0 in | Wt 260.4 lb

## 2016-09-21 DIAGNOSIS — I4891 Unspecified atrial fibrillation: Secondary | ICD-10-CM

## 2016-09-21 DIAGNOSIS — R109 Unspecified abdominal pain: Secondary | ICD-10-CM

## 2016-09-21 DIAGNOSIS — G8929 Other chronic pain: Secondary | ICD-10-CM

## 2016-09-21 DIAGNOSIS — Z87891 Personal history of nicotine dependence: Secondary | ICD-10-CM | POA: Diagnosis not present

## 2016-09-21 DIAGNOSIS — K582 Mixed irritable bowel syndrome: Secondary | ICD-10-CM | POA: Diagnosis not present

## 2016-09-21 DIAGNOSIS — G4733 Obstructive sleep apnea (adult) (pediatric): Secondary | ICD-10-CM

## 2016-09-21 DIAGNOSIS — C3492 Malignant neoplasm of unspecified part of left bronchus or lung: Secondary | ICD-10-CM

## 2016-09-21 DIAGNOSIS — Z86718 Personal history of other venous thrombosis and embolism: Secondary | ICD-10-CM

## 2016-09-21 DIAGNOSIS — M545 Low back pain: Secondary | ICD-10-CM | POA: Diagnosis not present

## 2016-09-21 DIAGNOSIS — R293 Abnormal posture: Secondary | ICD-10-CM

## 2016-09-21 DIAGNOSIS — Z800 Family history of malignant neoplasm of digestive organs: Secondary | ICD-10-CM

## 2016-09-21 DIAGNOSIS — I480 Paroxysmal atrial fibrillation: Secondary | ICD-10-CM | POA: Diagnosis not present

## 2016-09-21 DIAGNOSIS — R262 Difficulty in walking, not elsewhere classified: Secondary | ICD-10-CM | POA: Diagnosis not present

## 2016-09-21 DIAGNOSIS — Z7189 Other specified counseling: Secondary | ICD-10-CM

## 2016-09-21 DIAGNOSIS — Z5111 Encounter for antineoplastic chemotherapy: Secondary | ICD-10-CM | POA: Insufficient documentation

## 2016-09-21 DIAGNOSIS — C3432 Malignant neoplasm of lower lobe, left bronchus or lung: Secondary | ICD-10-CM

## 2016-09-21 DIAGNOSIS — M6281 Muscle weakness (generalized): Secondary | ICD-10-CM | POA: Diagnosis not present

## 2016-09-21 DIAGNOSIS — R918 Other nonspecific abnormal finding of lung field: Secondary | ICD-10-CM

## 2016-09-21 HISTORY — DX: Other specified counseling: Z71.89

## 2016-09-21 LAB — COMPREHENSIVE METABOLIC PANEL
ALBUMIN: 3.2 g/dL — AB (ref 3.5–5.0)
ALK PHOS: 115 U/L (ref 40–150)
ALT: 16 U/L (ref 0–55)
ANION GAP: 9 meq/L (ref 3–11)
AST: 13 U/L (ref 5–34)
BUN: 7.6 mg/dL (ref 7.0–26.0)
CALCIUM: 9.7 mg/dL (ref 8.4–10.4)
CHLORIDE: 104 meq/L (ref 98–109)
CO2: 33 mEq/L — ABNORMAL HIGH (ref 22–29)
Creatinine: 1 mg/dL (ref 0.6–1.1)
EGFR: 63 mL/min/{1.73_m2} — AB (ref >90)
Glucose: 111 mg/dl (ref 70–140)
POTASSIUM: 3.7 meq/L (ref 3.5–5.1)
Sodium: 145 mEq/L (ref 136–145)
Total Bilirubin: <0.22 mg/dL (ref 0.20–1.20)
Total Protein: 7.2 g/dL (ref 6.4–8.3)

## 2016-09-21 LAB — CBC WITH DIFFERENTIAL/PLATELET
BASO%: 0.5 % (ref 0.0–2.0)
BASOS ABS: 0 10*3/uL (ref 0.0–0.1)
EOS ABS: 0.1 10*3/uL (ref 0.0–0.5)
EOS%: 1.5 % (ref 0.0–7.0)
HEMATOCRIT: 38.6 % (ref 34.8–46.6)
HEMOGLOBIN: 12.3 g/dL (ref 11.6–15.9)
LYMPH#: 1.1 10*3/uL (ref 0.9–3.3)
LYMPH%: 17.2 % (ref 14.0–49.7)
MCH: 26.9 pg (ref 25.1–34.0)
MCHC: 31.8 g/dL (ref 31.5–36.0)
MCV: 84.5 fL (ref 79.5–101.0)
MONO#: 0.6 10*3/uL (ref 0.1–0.9)
MONO%: 9.9 % (ref 0.0–14.0)
NEUT#: 4.6 10*3/uL (ref 1.5–6.5)
NEUT%: 70.9 % (ref 38.4–76.8)
PLATELETS: 257 10*3/uL (ref 145–400)
RBC: 4.57 10*6/uL (ref 3.70–5.45)
RDW: 16.8 % — AB (ref 11.2–14.5)
WBC: 6.6 10*3/uL (ref 3.9–10.3)

## 2016-09-21 NOTE — Progress Notes (Signed)
[place] Clinical Social Work  Clinical Social Work met with patient/family and Futures trader at [facility] appointment to offer support and assess for psychosocial needs.  Medical oncologist reviewed patient's diagnosis and recommended treatment plan with patient/family. Patient was accompanied by her spouse, Sarah [name], and daughter, Sarah [name].  Sarah [name] shared she feels less distress after meeting with the oncologist- reported her anxiety stemmed from not knowing what to expect at today's appointment.  She shared she plans to "take this one day at a time" and has no concerns at this time.  ONCBCN DISTRESS SCREENING 09/21/2016  Screening Type Initial Screening  Distress experienced in past week (1-10) 6  Emotional problem type Nervousness/Anxiety  Spiritual/Religous concerns type Facing my mortality  Physical Problem type Sleep/insomnia  Referral to clinical social work Yes     Clinical Social Work briefly discussed [name] Work role and [employer] support programs/services.  Clinical Social Work encouraged patient to call with any additional questions or concerns.   Maryjean Morn, MSW, LCSW, OSW-C Clinical Social Worker [facility] [phone]

## 2016-09-21 NOTE — Progress Notes (Signed)
[place] Telephone:([phone]) [phone]   Fax:(336) [phone] Multidisciplinary thoracic oncology clinic  CONSULT NOTE  REFERRING PHYSICIAN: Dr. Modesto Charon  REASON FOR CONSULTATION:  66 years old white female recently diagnosed with lung cancer.  HPI Sarah [name] is a 66 y.o. female with past medical history significant for osteoarthritis, anxiety, chronic back pain, left lower extremity deep venous thrombosis, GERD, paroxysmal atrial fibrillation, history of sleep apnea, spinal stenosis and long history of smoking but quit 1 month ago. The patient mentioned that she was complaining of left upper lobe abdominal pain and during her evaluation and chest x-ray was performed on 08/24/2016 and it showed large 9.3 cm mass versus infiltrate noted over the left midlung posteriorly questionable for malignancy. There was also mild lingular subsegmental atelectasis and small left pleural effusion. CT scan of the chest with contrast was performed on 08/31/2016 and it showed probable large central mass in the left hilum with associated collapse of the left lower lobe. The mass measured approximately 7.5 x 7.6 cm with obstruction of the left lower lobe bronchi. The findings were most compatible with central left lung cancer. The patient was referred to Dr. Roxan Hockey and on 09/12/2016 she had MRI of the brain that was negative for intracranial or calvarial metastatic disease. A PET scan showed large centrally necrotic left lower lobe mass measuring 8.9 cm in diameter with maximum SUV of 24.7. There was questionable mass invasion across the major fissure. There was a small suspected neoplastic lymph node along the lateral margin of the descending aorta car measuring only 0.4 cm in short axis but my the hypermetabolic. There was also questionable extension of the tumor nodularity into the sixth intercostal space but no definitive chest wall invasion. There was also small nodule posteriorly in  the left upper lobe measuring 0.6 x 0.3 cm not visibly hypermetabolic but also below the sensitive PET CT size today showed. There is no evidence of metastatic disease to the abdomen, pelvis or skeleton. There was also ill-defined accident related activity in the vicinity of the cervix and upper vagina with maximum SUV of 8.7 and gynecologic evaluation was recommended. On 09/14/2016 the patient underwent bronchoscopy with brushings and biopsies and endobronchial ultrasound under the care of Dr. Roxan Hockey. The final pathology ([id number]) of the left lower lobe lung biopsy was consistent with squamous cell carcinoma. The tissue block was sent for PDL 1 testing but the results are still pending. According to Dr. Roxan Hockey the patient is not a good surgical candidate for resection and he kindly referred her to the multidisciplinary thoracic oncology clinic today for evaluation and discussion of her treatment options. When seen today she continues to complain of pain in the left side of the abdomen with radiation around her back. She also has shortness breath at baseline and increased with exertion and mild cough with no hemoptysis. She denied having any weight loss or night sweats. She has intermittent nausea as well as diarrhea and constipation secondary to irritable bowel syndrome. She denied having any headache or visual changes. Family history significant for mother with colon cancer at age 18 and father died from a stroke. The patient is married and has 2 children. She was accompanied today by her husband Sarah [name] and her daughter Sarah [name]. She is to work as a Engineer, manufacturing. She has a history of smoking 1.5 pack per day for around 50 years and quit 1 month ago. She has no history of alcohol or drug abuse.  HPI  Past Medical History:  Diagnosis Date  . Arthritis   . Chronic anxiety    Patient describes as stress  . Chronic back pain   . DVT (deep venous thrombosis) ([place]) age 36  . GERD  (gastroesophageal reflux disease)   . Headache(784.0)   . Hypoestrogenism   . Lower extremity venous stasis    LLE, chronic  . Obesity   . Paroxysmal atrial fibrillation ([facility])   . PONV (postoperative nausea and vomiting)   . Pulmonary embolism ([place])   . Reflux   . Sleep apnea    wear CPAP  . Spinal stenosis   . Tobacco abuse   . Venous stasis   . Warfarin anticoagulation     Past Surgical History:  Procedure Laterality Date  . ANKLE SURGERY     x2  . APPENDECTOMY    . CARPAL TUNNEL RELEASE    . CHOLECYSTECTOMY    . OVARIAN CYST REMOVAL    . ROTATOR CUFF REPAIR    . TUBAL LIGATION    . VIDEO BRONCHOSCOPY WITH ENDOBRONCHIAL ULTRASOUND N/A 09/14/2016   Procedure: VIDEO BRONCHOSCOPY WITH ENDOBRONCHIAL ULTRASOUND;  Surgeon: Melrose Nakayama, MD;  Location: [facility] OR;  Service: Thoracic;  Laterality: N/A;    Family History  Problem Relation Age of Onset  . Arrhythmia Mother   . Cancer Mother        colon  . Heart disease Mother   . Stroke Father        Deceased    Social History Social History  Substance Use Topics  . Smoking status: Former Smoker    Packs/day: 1.00    Years: 40.00    Types: Cigarettes    Quit date: 08/29/2016  . Smokeless tobacco: Never Used  . Alcohol use No    Allergies  Allergen Reactions  . Aspirin Nausea And Vomiting    Current Outpatient Prescriptions  Medication Sig Dispense Refill  . buPROPion (WELLBUTRIN SR) 150 MG 12 hr tablet TAKE 1 TABLET BY MOUTH TWICE DAILY **KEEP APPT ON 06/09/16** 180 tablet 3  . calcium carbonate (TUMS - DOSED IN MG ELEMENTAL CALCIUM) 500 MG chewable tablet Chew 1 tablet by mouth 2 (two) times a week.     . diltiazem (CARDIZEM CD) 180 MG 24 hr capsule TAKE ONE CAPSULE BY MOUTH EVERY DAY 90 capsule 1  . enoxaparin (LOVENOX) 120 MG/0.8ML injection 115 mg every 12 hours as directed by physician 4 Syringe 0  . flecainide (TAMBOCOR) 50 MG tablet TAKE 1 TABLET (50 MG TOTAL) BY MOUTH 2 (TWO) TIMES DAILY. 60 tablet 6   . HYDROcodone-acetaminophen (NORCO) 7.5-325 MG tablet One tablet up to four times a day as needed for pain (Patient taking differently: Take 1 tablet by mouth 4 (four) times daily as needed for moderate pain. ) 120 tablet 0  . metoprolol tartrate (LOPRESSOR) 25 MG tablet 1 tablet as needed for heart racing 10 tablet 5  . ondansetron (ZOFRAN ODT) 4 MG disintegrating tablet Take 1 tablet by mouth every 6 hours as needed for nausea. 21 tablet 0  . pantoprazole (PROTONIX) 40 MG tablet TAKE 1 TABLET EVERY MORNING 90 tablet 1  . sucralfate (CARAFATE) 1 g tablet Take 1 tablet (1 g total) by mouth 2 (two) times daily. (Patient taking differently: Take 1 g by mouth 2 (two) times daily as needed (acid reflux). ) 60 tablet 5  . warfarin (COUMADIN) 5 MG tablet TAKE 1 TABLET BY MOUTH EVERY DAY AS DIRECTED (Patient taking differently: Take 2.5  mg by mouth daily) 90 tablet 1   No current facility-administered medications for this visit.     Review of Systems  Constitutional: positive for fatigue Eyes: negative Ears, nose, mouth, throat, and face: negative Respiratory: positive for cough and dyspnea on exertion Cardiovascular: negative Gastrointestinal: positive for abdominal pain Genitourinary:negative Integument/breast: negative Hematologic/lymphatic: negative Musculoskeletal:negative Neurological: negative Behavioral/Psych: negative Endocrine: negative Allergic/Immunologic: negative  Physical Exam  MGQ:QPYPP, healthy, no distress, well nourished, well developed and anxious SKIN: skin color, texture, turgor are normal, no rashes or significant lesions HEAD: Normocephalic, No masses, lesions, tenderness or abnormalities EYES: normal, PERRLA, Conjunctiva are pink and non-injected EARS: External ears normal, Canals clear OROPHARYNX:no exudate, no erythema and lips, buccal mucosa, and tongue normal  NECK: supple, no adenopathy, no JVD LYMPH:  no palpable lymphadenopathy, no  hepatosplenomegaly BREAST:not examined LUNGS: expiratory wheezes bilaterally HEART: regular rate & rhythm, no murmurs and no gallops ABDOMEN:abdomen soft, non-tender, obese, normal bowel sounds and no masses or organomegaly BACK: Back symmetric, no curvature., No CVA tenderness EXTREMITIES:no joint deformities, effusion, or inflammation, no edema, no skin discoloration  NEURO: alert & oriented x 3 with fluent speech, no focal motor/sensory deficits  PERFORMANCE STATUS: ECOG 1  LABORATORY DATA: Lab Results  Component Value Date   WBC 6.6 09/21/2016   HGB 12.3 09/21/2016   HCT 38.6 09/21/2016   MCV 84.5 09/21/2016   PLT 257 09/21/2016      Chemistry      Component Value Date/Time   NA 139 09/14/2016 0915   NA 143 08/24/2016 0948   K 3.3 (L) 09/14/2016 0915   CL 100 (L) 09/14/2016 0915   CO2 28 09/14/2016 0915   BUN 5 (L) 09/14/2016 0915   BUN 8 08/24/2016 0948   CREATININE 0.85 09/14/2016 0915      Component Value Date/Time   CALCIUM 9.0 09/14/2016 0915   ALKPHOS 115 09/14/2016 0915   AST 39 09/14/2016 0915   ALT 28 09/14/2016 0915   BILITOT 0.5 09/14/2016 0915   BILITOT 0.3 08/24/2016 0948       RADIOGRAPHIC STUDIES: Dg Chest 2 View  Result Date: 09/14/2016 CLINICAL DATA:  Preoperative bronchoscopy. Previously documented left lower lobe mass EXAM: CHEST  2 VIEW COMPARISON:  Chest radiograph August 24, 2016 and head CT Sep 13, 2016 FINDINGS: The previously noted large mass arising in the superior segment of the left lower lobe appears stable. There is mild atelectasis in the superior lingula. Right lung is clear. No new opacity. Heart size and pulmonary vascularity are normal. There is no demonstrable adenopathy by radiography. IMPRESSION: Large mass on the left which appears to arise from the superior segment left lower lobe is stable. Stable atelectasis superior lingula. No new opacity. Stable cardiac silhouette. Electronically Signed   By: Lowella Grip III M.D.    On: 09/14/2016 09:37   Dg Chest 2 View  Result Date: 08/24/2016 CLINICAL DATA:  Left upper quadrant pain. EXAM: CHEST  2 VIEW COMPARISON:  05/11/2012.  12/28/2011. FINDINGS: Mediastinum and hilar structures are normal. 9.3 cm mass versus infiltrate noted over the left mid lung posteriorly. This could represent a malignancy. Contrast-enhanced chest CT is suggested for further evaluation. Mild lingular subsegmental atelectasis. Small left pleural effusion cannot be excluded. No pneumothorax. Mild cardiomegaly. No pulmonary venous congestion. No acute bony abnormality identified. Degenerative changes thoracic spine. IMPRESSION: 1. Large 9.3 cm mass versus infiltrate noted over the left mid lung posteriorly. This could represent a malignancy. Contrast-enhanced chest CT suggest for further evaluation.  2. Mild lingular subsegmental atelectasis. Small left pleural effusion . These results will be called to the ordering clinician or representative by the Radiologist Assistant, and communication documented in the PACS or zVision Dashboard. Electronically Signed   By: [place]   On: 08/24/2016 11:02   Ct Chest W Contrast  Result Date: 08/31/2016 CLINICAL DATA:  Abnormal chest x-ray. EXAM: CT CHEST WITH CONTRAST TECHNIQUE: Multidetector CT imaging of the chest was performed during intravenous contrast administration. CONTRAST:  47mL ISOVUE-300 IOPAMIDOL (ISOVUE-300) INJECTION 61% COMPARISON:  Chest x-ray 08/24/2016. FINDINGS: Cardiovascular: Heart is normal size. Aorta is normal caliber. Scattered aortic arch calcifications. Mediastinum/Nodes: Small borderline sized mediastinal lymph nodes. Prevascular lymph nodes have a short axis diameter of 6 mm. Similarly sized subcarinal and right paratracheal lymph nodes. No visible hilar adenopathy although the left hilum is not well visualized due to surrounding soft tissue/mass. No axillary adenopathy. Lungs/Pleura: Probable large central mass in the left hilum.  Associated collapse of the left lower lobe. The central mass is approximately 7.5 x 7.6 cm on image 60. Obstruction of the left lower lobe bronchi noted. Trace right pleural effusion. Linear scarring in the right middle lobe. Right lung otherwise clear. Biapical scarring. Upper Abdomen: 2.7 cm nodule in the right adrenal gland which is low-density on this enhanced study and likely reflects adenoma. No acute findings in the upper abdomen. Musculoskeletal: Chest wall soft tissues are unremarkable. No acute bony abnormality. IMPRESSION: Large central mass in the central left lung/left lower lobe with obstruction of the left lower lobe bronchi and left lower lobe collapse. Findings most compatible with central left lung cancer. Recommend cardiothoracic consultation and tissue sampling. PET CT may be beneficial to better characterize the central left lung mass. Trace left pleural effusion. Low-density mass in the right adrenal gland. Given its relatively low-density on this contrasted study, I favor this represents adenoma, but recommend attention on follow-up imaging and/or PET CT. Electronically Signed   By: Rolm Baptise M.D.   On: 08/31/2016 08:27   Mr Jeri Cos HC Contrast  Result Date: 09/12/2016 CLINICAL DATA:  Headache and lung mass. EXAM: MRI HEAD WITHOUT AND WITH CONTRAST TECHNIQUE: Multiplanar, multiecho pulse sequences of the brain and surrounding structures were obtained without and with intravenous contrast. CONTRAST:  98mL MULTIHANCE GADOBENATE DIMEGLUMINE 529 MG/ML IV SOLN COMPARISON:  None. FINDINGS: Brain: The midline structures are normal. There is no focal diffusion restriction to indicate acute infarct. There is a dilated perivascular space versus neuro epithelial cyst at the right insula inferior margin, unchanged. There is multifocal hyperintense T2-weighted signal within the periventricular white matter, most often seen in the setting of chronic microvascular ischemia. No intraparenchymal  hematoma or chronic microhemorrhage. There is mild age advanced atrophy. The dura is normal and there is no extra-axial collection. Vascular: Major intracranial arterial and venous sinus flow voids are preserved. Skull and upper cervical spine: The visualized skull base, calvarium, upper cervical spine and extracranial soft tissues are normal. Sinuses/Orbits: No fluid levels or advanced mucosal thickening. No mastoid effusion. Normal orbits. IMPRESSION: 1. No intracranial or calvarial metastatic disease. No acute intracranial abnormality. 2. Chronic microvascular ischemia and mildly age advanced atrophy. Electronically Signed   By: Ulyses Jarred M.D.   On: 09/12/2016 22:36   US Abdomen Complete  Result Date: 08/29/2016 CLINICAL DATA:  Left upper quadrant pain for the past 2-3 weeks. Previous cholecystectomy. History of gastroesophageal reflux. EXAM: ABDOMEN ULTRASOUND COMPLETE COMPARISON:  None in PACs FINDINGS: Gallbladder: The gallbladder is surgically absent.  Common bile duct: Diameter: 10 mm. No intraluminal echoes are observed. Liver: The hepatic echotexture is mildly increased diffusely. There is no focal mass nor ductal dilation. IVC: No abnormality visualized. Pancreas: Evaluation of the pancreas was very limited due to bowel gas. Only a small portion of the pancreatic body could be demonstrated. Spleen: The spleen is normal in size and echotexture. Right Kidney: Length: 12.0 cm. The renal cortical echotexture is normal. There is an upper pole cyst measuring 2.7 x 2.4 x 2.3 cm. There is no hydronephrosis. Left Kidney: Length: 11.7 cm. The renal cortical echotexture is similar to that on the right. There is no hydronephrosis. Abdominal aorta: No aneurysm visualized. The aortic bifurcation could not be demonstrated. Other findings: No ascites is observed. IMPRESSION: Previous cholecystectomy. Increased hepatic echotexture most compatible with fatty infiltrative change. Normal appearance of the spleen and  kidneys. Limited visualization of the pancreas and abdominal aorta due to bowel gas. Electronically Signed   By: David  Martinique M.D.   On: 08/29/2016 11:24   Nm Pet Image Initial (pi) Skull Base To Thigh  Result Date: 09/13/2016 CLINICAL DATA:  Initial treatment strategy for left lower lobe lung mass. EXAM: NUCLEAR MEDICINE PET SKULL BASE TO THIGH TECHNIQUE: 13.1 mCi F-18 FDG was injected intravenously. Full-ring PET imaging was performed from the skull base to thigh after the radiotracer. CT data was obtained and used for attenuation correction and anatomic localization. FASTING BLOOD GLUCOSE:  Value: 98 mg/dl COMPARISON:  Chest CT 08/30/2016 FINDINGS: NECK No hypermetabolic lymph nodes in the neck. Probable dilated perivascular space below the right lentiform nucleus as shown on recent MRI. Mildly asymmetric activity along the right posterior glottis, maximum SUV 5.9. A similar area on the left side has maximum SUV 4.0. CHEST Centrally necrotic left lower lobe mass about 8.9 cm in diameter, maximum SUV 24.7. Calcifications in the adjacent truncated tracheobronchial tree possibly due to inspissated mucus. I am unsure whether this mass crosses the major fissure. There is some faint nodularity posteriorly in the left upper lobe measuring about 0.6 by 0.3 cm, not visibly hypermetabolic but also below sensitive PET-CT size thresholds. A small suspected lymph node along the lateral margin of the descending arch measures 4 mm in short axis on image 88/ 3 and has a maximum SUV in of 4.7, mildly above background blood pool activity. There is potentially some nodularity of tumor extending into the sixth intercostal space on images 101-2 of series 3. This is not entirely certain and does not constitute definitive chest wall invasion. No adjacent bony findings. Scarring or atelectasis in the right upper lobe and right middle lobe medially. ABDOMEN/PELVIS No abnormal hypermetabolic activity within the liver, pancreas,  adrenal glands, or spleen. No hypermetabolic lymph nodes in the abdomen or pelvis. Physiologic activity in the right colon. Aortoiliac atherosclerotic vascular disease. Prior cholecystectomy. Ill-defined accentuated activity in the vicinity of the cervix and upper vagina, maximum SUV approximately 8.7. I am uncertain whether some of this represents motion artifact related to bladder activity. SKELETON No focal hypermetabolic activity to suggest skeletal metastasis. IMPRESSION: 1. Large centrally necrotic left lower lobe mass, 8.9 cm diameter, maximum SUV 24.7. Although primarily in the right lower lobe I am unsure whether this mass invades across the major fissure. 2. Small suspected neoplastic lymph node along the lateral margin of the descending aortic arch, only 4 mm in short axis but mildly hypermetabolic. 3. Questionable extension of tumor nodularity into the sixth intercostal space, although not definitive for chest wall invasion.  4. Small nodule posteriorly in the left upper lobe 0.6 by 0.3 cm, not visibly hypermetabolic but also below sensitive PET-CT size thresholds. 5. No findings of metastatic disease to the abdomen, pelvis, or skeleton. The mass does not appear to directly invade the adjacent left posterior ribs. 6. Ill-defined accentuated activity in the vicinity of the cervix and upper vagina, maximum SUV 8.7. Although possibly artifactual, consider gynecology referral to exclude the unlikely possibility of cervical malignancy. 7.  Aortic Atherosclerosis (ICD10-I70.0). 8. Mildly asymmetric activity along the right upper posterior glottis without definite CT correlate, probably incidental. Electronically Signed   By: Van Clines M.D.   On: 09/13/2016 14:37    ASSESSMENT: This is a very pleasant 66 years old white female recently diagnosed with unresectable stage IIIA (T4, N0, M0) non-small cell lung cancer, squamous cell carcinoma diagnosed in May 2018 and presented with large obstructing left  lower lobe lung mass suspicious small mediastinal lymphadenopathy.  PLAN: I had a lengthy discussion with the patient and her family today about her current disease stage, prognosis and treatment options. I personally and independently reviewed the scan images and discuss the results and showed the images to the patient and her family. Unfortunately the patient has unresectable a stage IIIa non-small cell lung cancer. I recommended for the patient a course of concurrent chemoradiation with weekly carboplatin for AUC of 2 and paclitaxel 45 MG/M2 followed by consolidation immunotherapy with Imfinzi (Durvalumab) if the patient has no evidence for disease progression after the initial course of concurrent chemoradiation. I discussed with the patient adverse effect of the chemotherapy including but not limited to alopecia, myelosuppression, nausea and vomiting, peripheral neuropathy, liver or renal dysfunction. I will arrange for the patient to have a chemotherapy education class before starting the first dose of his chemotherapy. The patient will be seen by Dr. Tammi Klippel from radiation oncology later today. She is expected to start the first dose of her concurrent chemoradiation on 09/29/2016. I will call her pharmacy with prescription for Compazine 10 mg by mouth every 6 hours as needed for nausea. The patient was seen during the multidisciplinary thoracic oncology clinic today by medical oncology, radiation oncology, social worker and physical therapist. For the questionable hypermetabolic activity in the cervical area, I will refer the patient to gynecology for evaluation and to rule out any malignancy in this area. She will come back for follow-up visit in 3 weeks for evaluation and management of any adverse effect of her treatment. The patient was advised to call immediately if she has any concerning symptoms in the interval. The patient voices understanding of current disease status and treatment  options and is in agreement with the current care plan. All questions were answered. The patient knows to call the clinic with any problems, questions or concerns. We can certainly see the patient much sooner if necessary. Thank you so much for allowing me to participate in the care of Sarah [name]. I will continue to follow up the patient with you and assist in her care.  I spent 55 minutes counseling the patient face to face. The total time spent in the appointment was 80 minutes.  Disclaimer: This note was dictated with voice recognition software. Similar sounding words can inadvertently be transcribed and may not be corrected upon review.   Analeigh Aries K. Sep 21, 2016, 1:17 PM

## 2016-09-21 NOTE — Therapy (Signed)
[place], [place], [address] Phone: [phone]   Fax:  [phone]  Physical Therapy Evaluation  Patient Details  Name: Sarah [name] MRN: [id number] Date of Birth: [birth year] Referring Provider: Dr. Curt Bears   Encounter Date: 09/21/2016      PT End of Session - 09/21/16 1529    Visit Number 1   Number of Visits 1   PT Start Time 1455   PT Stop Time 1515   PT Time Calculation (min) 20 min   Activity Tolerance Patient tolerated treatment well   Behavior During Therapy [facility] for tasks assessed/performed      Past Medical History:  Diagnosis Date  . Arthritis   . Chronic anxiety    Patient describes as stress  . Chronic back pain   . DVT (deep venous thrombosis) ([place]) age 48  . GERD (gastroesophageal reflux disease)   . Goals of care, counseling/discussion 09/21/2016  . Headache(784.0)   . Hypoestrogenism   . Lower extremity venous stasis    LLE, chronic  . Obesity   . Paroxysmal atrial fibrillation ([facility])   . PONV (postoperative nausea and vomiting)   . Pulmonary embolism ([place])   . Reflux   . Sleep apnea    wear CPAP  . Spinal stenosis   . Tobacco abuse   . Venous stasis   . Warfarin anticoagulation     Past Surgical History:  Procedure Laterality Date  . ANKLE SURGERY     x2  . APPENDECTOMY    . CARPAL TUNNEL RELEASE    . CHOLECYSTECTOMY    . OVARIAN CYST REMOVAL    . ROTATOR CUFF REPAIR    . TUBAL LIGATION    . VIDEO BRONCHOSCOPY WITH ENDOBRONCHIAL ULTRASOUND N/A 09/14/2016   Procedure: VIDEO BRONCHOSCOPY WITH ENDOBRONCHIAL ULTRASOUND;  Surgeon: Melrose Nakayama, MD;  Location: [facility];  Service: Thoracic;  Laterality: N/A;    There were no vitals filed for this visit.       Subjective Assessment - 09/21/16 1518    Subjective reports some left side back pain, some discomfort in knee   Patient is accompained by: Family member  husband and daughter   Pertinent History Pt. presented with abdominal pain and workup showed a large lung mass.  She has had scans and bronch/EBUS leading to a diagnosis of left lower lobe squamous cell carcinoma, measuring 7-8 cm., negative nodes, and lung atelectasis.  She plans to have chemoradiation.  h/o chronic back pain, spinal stenosis, LE venous stasis, ankle surgery x 2, rotator cuff repair x 2, and carpal tunnel release; current smoker.   Patient Stated Goals get info from all lung clinic providers   Currently in Pain? Yes   Pain Score 3    Pain Location Back   Pain Orientation Left   Pain Descriptors / Indicators Aching   Aggravating Factors  nothing   Pain Relieving Factors lying on her right side and putting pressure on the left            [facility] PT Assessment - 09/21/16 0001      Assessment   Medical Diagnosis left lower lobe squamous cell carcinoma   Referring Provider Dr. Curt Bears    Onset Date/Surgical Date 08/31/16   Prior Therapy none     Precautions   Precautions Other (comment)   Precaution Comments cancer precautions; patient reports her balance is not good     Restrictions   Weight Bearing Restrictions No  Balance Screen   Has the patient fallen in the past 6 months No   Has the patient had a decrease in activity level because of a fear of falling?  No   Is the patient reluctant to leave their home because of a fear of falling?  No     Home Ecologist residence   Living Arrangements Spouse/significant other   Type of [place] One level     Prior Function   Level of Independence Independent   Leisure no regular exercise     Cognition   Overall Cognitive Status Within Functional Limits for tasks assessed     Observation/Other Assessments   Observations obese woman who appears a bit anxious     Coordination   Gross Motor Movements are Fluid and Coordinated Yes     Functional Tests   Functional tests Sit to  Stand     Sit to Stand   Comments 9 times in 30 seconds, below average for age  mild dyspnea following     Posture/Postural Control   Posture/Postural Control Postural limitations   Postural Limitations Forward head     ROM / Strength   AROM / PROM / Strength AROM     AROM   Overall AROM Comments standing trunk AROM WFL in flexion and sidebend; 25% limited in extension, 10-20% limited in rotation bilat.     Ambulation/Gait   Ambulation/Gait Yes   Ambulation/Gait Assistance 7: Independent   Gait Comments reports she uses a cane for longer distances (>1/4 mile), due to back pain and leg problems     Balance   Balance Assessed Yes     Dynamic Standing Balance   Dynamic Standing - Comments reaches forward 11 inches in standing, below average for age  pt. also reports her balance is not good                           PT Education - 09/21/16 1528    Education provided Yes   Education Details energy conservation, walking, Cure article on staying active, posture, breathing, PT info   Person(s) Educated Patient;Spouse;Child(ren)   Methods Explanation;Handout   Comprehension Verbalized understanding               Lung Clinic Goals - 09/21/16 1532      Patient will be able to verbalize understanding of the benefit of exercise to decrease fatigue.   Status Achieved     Patient will be able to verbalize the importance of posture.   Status Achieved     Patient will be able to demonstrate diaphragmatic breathing for improved lung function.   Status Achieved     Patient will be able to verbalize understanding of the role of physical therapy to prevent functional decline and who to contact if physical therapy is needed.   Status Achieved             Plan - 09/21/16 1529    Clinical Impression Statement This is an obese woman with a large lung tumor who plans to have concurrent chemoradiation treatment.  She has forward head posture, mildly limited  trunk AROM, decreased sit to stand and forward reach for her age.  Eval is moderate due to multiple comorbidities including h/o several orthopedic surgeries, back pain, arthritis.   Rehab Potential Good   PT Frequency One time visit   PT Treatment/Interventions Patient/family education  PT Next Visit Plan none at this time   PT Home Exercise Plan walking or other exercise, breathing exercises   Consulted and Agree with Plan of Care Patient      Patient will benefit from skilled therapeutic intervention in order to improve the following deficits and impairments:  Decreased mobility, Decreased range of motion, Postural dysfunction, Decreased activity tolerance  Visit Diagnosis: Muscle weakness (generalized) - Plan: PT plan of care cert/re-cert  Abnormal posture - Plan: PT plan of care cert/re-cert  Difficulty in walking, not elsewhere classified - Plan: PT plan of care cert/re-cert      G-Codes - 81/02/54 1532    Functional Assessment Tool Used (Outpatient Only) clinical judgement   Functional Limitation Mobility: Walking and moving around   Mobility: Walking and Moving Around Current Status [phone]) At least 1 percent but less than 20 percent impaired, limited or restricted   Mobility: Walking and Moving Around Goal Status [phone]) At least 1 percent but less than 20 percent impaired, limited or restricted   Mobility: Walking and Moving Around Discharge Status [phone]) At least 1 percent but less than 20 percent impaired, limited or restricted       Problem List Patient Active Problem List   Diagnosis Date Noted  . Goals of care, counseling/discussion 09/21/2016  . Encounter for antineoplastic chemotherapy 09/21/2016  . Squamous cell carcinoma of lung, left ([place]) 09/20/2016  . Lung mass 09/15/2016  . Arthritis 06/23/2013  . Atrial fibrillation with RVR ([place]) 08/30/2012  . Obesity, unspecified 08/30/2012  . Hypokalemia 08/30/2012  . Long term current use of anticoagulant therapy  08/03/2012  . OSA (obstructive sleep apnea) 05/23/2012  . History of pulmonary embolism 12/28/2011  . Tobacco abuse 12/28/2011  . Chronic anxiety 12/28/2011  . GERD (gastroesophageal reflux disease) 12/28/2011  . Warfarin anticoagulation 12/28/2011    SALISBURY,DONNA 09/21/2016, 3:35 PM  [place], [place], [address] Phone: [phone]   Fax:  [phone]  Name: Sarah [name] MRN: [id number] Date of Birth: December 14, 1950  Serafina Royals, PT 09/21/16 3:35 PM

## 2016-09-21 NOTE — Progress Notes (Signed)
Radiation Oncology         [phone]) [phone] ________________________________  Multidisciplinary Thoracic Oncology Clinic [facility]) Initial Outpatient Consultation  Name: Sarah [name] MRN: [id number]  Date: 09/21/2016  DOB: 04/06/1951  YK:DXIPJA, Grace Bushy, MD  Lebron Conners D*   REFERRING PHYSICIAN: Towanda Malkin*  DIAGNOSIS:  66 yo woman with stage cT3N0M0 squamous cell carcinoma of the left lower lung - AJCC 7th - Stage IIB,    ICD-9-CM ICD-10-CM   1. Squamous cell carcinoma of lung, left (HCC) 162.9 C34.92     HISTORY OF PRESENT ILLNESS: Sarah [name] is a 66 y.o. female with a newly diagnosed Stage IIB, T3N0M0 non small cell carcinoma, squamous cell carcinoma of the left lung.  She initially presented to her PCP in 07/2015 with complaints of LUQ abdominal pain radiating to her back.  A chest xray was performed as part of her workup and this revealed a large 9.3 cm mass vs infiltrate over the left mid lung posteriorly.  Further evaluation with CT Chest on 08/30/16 revealed a 7.5 x 7.6 cm central mass in the left hilum/LLL with obstruction of the left lower lobe bronchi and LLL collapse.  Additionally, there was a 2.7 cm low density nodule in the right adrenal gland favoring an adenoma.  MRI brain on 09/12/16 was negative for intracranial or calvarial metastasis.  PET CT scan on 09/13/16 demonstrated a centrally necrotic left lower lobe mass measuring 8.9 cm in diameter, with a maximum SUV 24.7. There was a small suspected neoplastic lymph node along the lateral margin of the descending aortic arch, only 4 mm in short axis but mildly hypermetabolic. No findings of metastatic disease to the abdomen, pelvis, or skeleton.  She underwent bronchoscopy with EBUS for biopsy of the LLL lesion and mediastinal lymph nodes on 09/14/16 by Dr. Roxan Hockey.  Pathology revealed squamous cell carcinoma in the LLL specimen.  Mediastinal nodes were negative on cytology.  The patient was  referred today, with her husband and daughter, for presentation in the multidisciplinary thoracic oncology conference.  Radiology studies and pathology slides were presented there for review and discussion of treatment options.  A consensus was discussed regarding potential next steps.  PREVIOUS RADIATION THERAPY: No  PAST MEDICAL HISTORY:  Past Medical History:  Diagnosis Date  . Arthritis   . Chronic anxiety    Patient describes as stress  . Chronic back pain   . DVT (deep venous thrombosis) ([place]) age 62  . GERD (gastroesophageal reflux disease)   . Goals of care, counseling/discussion 09/21/2016  . Headache(784.0)   . Hypoestrogenism   . Lower extremity venous stasis    LLE, chronic  . Obesity   . Paroxysmal atrial fibrillation (HCC)   . PONV (postoperative nausea and vomiting)   . Pulmonary embolism ([place])   . Reflux   . Sleep apnea    wear CPAP  . Spinal stenosis   . Tobacco abuse   . Venous stasis   . Warfarin anticoagulation       PAST SURGICAL HISTORY: Past Surgical History:  Procedure Laterality Date  . ANKLE SURGERY     x2  . APPENDECTOMY    . CARPAL TUNNEL RELEASE    . CHOLECYSTECTOMY    . OVARIAN CYST REMOVAL    . ROTATOR CUFF REPAIR    . TUBAL LIGATION    . VIDEO BRONCHOSCOPY WITH ENDOBRONCHIAL ULTRASOUND N/A 09/14/2016   Procedure: VIDEO BRONCHOSCOPY WITH ENDOBRONCHIAL ULTRASOUND;  Surgeon: Melrose Nakayama, MD;  Location: [facility]  OR;  Service: Thoracic;  Laterality: N/A;    FAMILY HISTORY:  Family History  Problem Relation Age of Onset  . Arrhythmia Mother   . Cancer Mother        colon  . Heart disease Mother   . Stroke Father        Deceased    SOCIAL HISTORY:  She lives in [place], [place] (near [place], [place]) with her husband. Social History   Social History  . Marital status: Married    Spouse name: N/A  . Number of children: N/A  . Years of education: N/A   Occupational History  . EMT     Volunteer  . Dialysis Tech     Retired    Social History Main Topics  . Smoking status: Former Smoker    Packs/day: 1.00    Years: 40.00    Types: Cigarettes    Quit date: 08/29/2016  . Smokeless tobacco: Never Used  . Alcohol use No  . Drug use: No  . Sexual activity: Not on file   Other Topics Concern  . Not on file   Social History Narrative  . No narrative on file    ALLERGIES: Aspirin  MEDICATIONS:  Current Outpatient Prescriptions  Medication Sig Dispense Refill  . buPROPion (WELLBUTRIN SR) 150 MG 12 hr tablet TAKE 1 TABLET BY MOUTH TWICE DAILY **KEEP APPT ON 06/09/16** 180 tablet 3  . calcium carbonate (TUMS - DOSED IN MG ELEMENTAL CALCIUM) 500 MG chewable tablet Chew 1 tablet by mouth 2 (two) times a week.     . diltiazem (CARDIZEM CD) 180 MG 24 hr capsule TAKE ONE CAPSULE BY MOUTH EVERY DAY 90 capsule 1  . enoxaparin (LOVENOX) 120 MG/0.8ML injection 115 mg every 12 hours as directed by physician 4 Syringe 0  . flecainide (TAMBOCOR) 50 MG tablet TAKE 1 TABLET (50 MG TOTAL) BY MOUTH 2 (TWO) TIMES DAILY. 60 tablet 6  . HYDROcodone-acetaminophen (NORCO) 7.5-325 MG tablet One tablet up to four times a day as needed for pain (Patient taking differently: Take 1 tablet by mouth 4 (four) times daily as needed for moderate pain. ) 120 tablet 0  . metoprolol tartrate (LOPRESSOR) 25 MG tablet 1 tablet as needed for heart racing (Patient not taking: Reported on 09/21/2016) 10 tablet 5  . ondansetron (ZOFRAN ODT) 4 MG disintegrating tablet Take 1 tablet by mouth every 6 hours as needed for nausea. 21 tablet 0  . pantoprazole (PROTONIX) 40 MG tablet TAKE 1 TABLET EVERY MORNING 90 tablet 1  . sucralfate (CARAFATE) 1 g tablet Take 1 tablet (1 g total) by mouth 2 (two) times daily. (Patient taking differently: Take 1 g by mouth 2 (two) times daily as needed (acid reflux). ) 60 tablet 5  . warfarin (COUMADIN) 5 MG tablet TAKE 1 TABLET BY MOUTH EVERY DAY AS DIRECTED (Patient taking differently: Take 2.5 mg by mouth daily) 90 tablet 1    No current facility-administered medications for this encounter.     REVIEW OF SYSTEMS:  On review of systems, the patient reports that she is doing well overall. She denies fevers, chills, night sweats, unintended weight changes. She denies any bowel or bladder disturbances, and denies abdominal pain, nausea or vomiting. She reports SOB with exertion, a dry cough, left upper quadrant pain, chronic back pain, and lower extremity edema. A complete review of systems is obtained and is otherwise negative.    PHYSICAL EXAM:  Wt Readings from Last 3 Encounters:  09/21/16 260 lb  6.4 oz (118.1 kg)  09/14/16 256 lb (116.1 kg)  09/07/16 256 lb (116.1 kg)   Temp Readings from Last 3 Encounters:  09/21/16 97.7 F (36.5 C) (Oral)  09/14/16 97.5 F (36.4 C)  08/27/15 98.2 F (36.8 C) (Oral)   BP Readings from Last 3 Encounters:  09/21/16 (!) 123/53  09/14/16 (!) 123/44  09/07/16 (!) 144/81   Pulse Readings from Last 3 Encounters:  09/21/16 62  09/14/16 75  09/07/16 68   In general this is a well appearing caucasian female in no acute distress. She is alert and oriented x4 and appropriate throughout the examination. HEENT reveals that the patient is normocephalic, atraumatic. EOMs are intact. PERRLA. Skin is intact without any evidence of gross lesions. Cardiovascular exam reveals a regular rate and rhythm, no clicks rubs or murmurs are auscultated. Pulmonary exam reveals normal effort with wheezing bilaterally without rales or rhonchi. Lymphatic assessment is performed and does not reveal any adenopathy in the cervical, supraclavicular, axillary, or inguinal chains. Abdomen has active bowel sounds in all quadrants and is intact. The abdomen is soft, non tender, non distended. Lower extremities are negative for deep calf tenderness, cyanosis or clubbing.  There is 1+ pretibial pitting edema in the RLE and 2+ pretibial pitting edema in the LLE.  KPS = 100  100 - Normal; no complaints; no  evidence of disease. 90   - Able to carry on normal activity; minor signs or symptoms of disease. 80   - Normal activity with effort; some signs or symptoms of disease. 45   - Cares for self; unable to carry on normal activity or to do active work. 60   - Requires occasional assistance, but is able to care for most of his personal needs. 50   - Requires considerable assistance and frequent medical care. 66   - Disabled; requires special care and assistance. 46   - Severely disabled; hospital admission is indicated although death not imminent. 15   - Very sick; hospital admission necessary; active supportive treatment necessary. 10   - Moribund; fatal processes progressing rapidly. 0     - Dead  Karnofsky DA, Abelmann [place], Craver LS and Burchenal [facility] [phone]) The use of the nitrogen mustards in the palliative treatment of carcinoma: with particular reference to bronchogenic carcinoma Cancer 1 [id number]  LABORATORY DATA:  Lab Results  Component Value Date   WBC 6.6 09/21/2016   HGB 12.3 09/21/2016   HCT 38.6 09/21/2016   MCV 84.5 09/21/2016   PLT 257 09/21/2016   Lab Results  Component Value Date   NA 145 09/21/2016   K 3.7 09/21/2016   CL 100 (L) 09/14/2016   CO2 33 (H) 09/21/2016   Lab Results  Component Value Date   ALT 16 09/21/2016   AST 13 09/21/2016   ALKPHOS 115 09/21/2016   BILITOT <0.22 09/21/2016     RADIOGRAPHY: Dg Chest 2 View  Result Date: 09/14/2016 CLINICAL DATA:  Preoperative bronchoscopy. Previously documented left lower lobe mass EXAM: CHEST  2 VIEW COMPARISON:  Chest radiograph August 24, 2016 and head CT Sep 13, 2016 FINDINGS: The previously noted large mass arising in the superior segment of the left lower lobe appears stable. There is mild atelectasis in the superior lingula. Right lung is clear. No new opacity. Heart size and pulmonary vascularity are normal. There is no demonstrable adenopathy by radiography. IMPRESSION: Large mass on the left which appears to  arise from the superior segment left lower lobe is stable.  Stable atelectasis superior lingula. No new opacity. Stable cardiac silhouette. Electronically Signed   By: Lowella Grip III M.D.   On: 09/14/2016 09:37   Dg Chest 2 View  Result Date: 08/24/2016 CLINICAL DATA:  Left upper quadrant pain. EXAM: CHEST  2 VIEW COMPARISON:  05/11/2012.  12/28/2011. FINDINGS: Mediastinum and hilar structures are normal. 9.3 cm mass versus infiltrate noted over the left mid lung posteriorly. This could represent a malignancy. Contrast-enhanced chest CT is suggested for further evaluation. Mild lingular subsegmental atelectasis. Small left pleural effusion cannot be excluded. No pneumothorax. Mild cardiomegaly. No pulmonary venous congestion. No acute bony abnormality identified. Degenerative changes thoracic spine. IMPRESSION: 1. Large 9.3 cm mass versus infiltrate noted over the left mid lung posteriorly. This could represent a malignancy. Contrast-enhanced chest CT suggest for further evaluation. 2. Mild lingular subsegmental atelectasis. Small left pleural effusion . These results will be called to the ordering clinician or representative by the Radiologist Assistant, and communication documented in the PACS or zVision Dashboard. Electronically Signed   By: [place]   On: 08/24/2016 11:02   Ct Chest W Contrast  Result Date: 08/31/2016 CLINICAL DATA:  Abnormal chest x-ray. EXAM: CT CHEST WITH CONTRAST TECHNIQUE: Multidetector CT imaging of the chest was performed during intravenous contrast administration. CONTRAST:  43mL ISOVUE-300 IOPAMIDOL (ISOVUE-300) INJECTION 61% COMPARISON:  Chest x-ray 08/24/2016. FINDINGS: Cardiovascular: Heart is normal size. Aorta is normal caliber. Scattered aortic arch calcifications. Mediastinum/Nodes: Small borderline sized mediastinal lymph nodes. Prevascular lymph nodes have a short axis diameter of 6 mm. Similarly sized subcarinal and right paratracheal lymph nodes. No  visible hilar adenopathy although the left hilum is not well visualized due to surrounding soft tissue/mass. No axillary adenopathy. Lungs/Pleura: Probable large central mass in the left hilum. Associated collapse of the left lower lobe. The central mass is approximately 7.5 x 7.6 cm on image 60. Obstruction of the left lower lobe bronchi noted. Trace right pleural effusion. Linear scarring in the right middle lobe. Right lung otherwise clear. Biapical scarring. Upper Abdomen: 2.7 cm nodule in the right adrenal gland which is low-density on this enhanced study and likely reflects adenoma. No acute findings in the upper abdomen. Musculoskeletal: Chest wall soft tissues are unremarkable. No acute bony abnormality. IMPRESSION: Large central mass in the central left lung/left lower lobe with obstruction of the left lower lobe bronchi and left lower lobe collapse. Findings most compatible with central left lung cancer. Recommend cardiothoracic consultation and tissue sampling. PET CT may be beneficial to better characterize the central left lung mass. Trace left pleural effusion. Low-density mass in the right adrenal gland. Given its relatively low-density on this contrasted study, I favor this represents adenoma, but recommend attention on follow-up imaging and/or PET CT. Electronically Signed   By: Rolm Baptise M.D.   On: 08/31/2016 08:27   Mr Jeri Cos DX Contrast  Result Date: 09/12/2016 CLINICAL DATA:  Headache and lung mass. EXAM: MRI HEAD WITHOUT AND WITH CONTRAST TECHNIQUE: Multiplanar, multiecho pulse sequences of the brain and surrounding structures were obtained without and with intravenous contrast. CONTRAST:  59mL MULTIHANCE GADOBENATE DIMEGLUMINE 529 MG/ML IV SOLN COMPARISON:  None. FINDINGS: Brain: The midline structures are normal. There is no focal diffusion restriction to indicate acute infarct. There is a dilated perivascular space versus neuro epithelial cyst at the right insula inferior margin,  unchanged. There is multifocal hyperintense T2-weighted signal within the periventricular white matter, most often seen in the setting of chronic microvascular ischemia. No intraparenchymal hematoma or  chronic microhemorrhage. There is mild age advanced atrophy. The dura is normal and there is no extra-axial collection. Vascular: Major intracranial arterial and venous sinus flow voids are preserved. Skull and upper cervical spine: The visualized skull base, calvarium, upper cervical spine and extracranial soft tissues are normal. Sinuses/Orbits: No fluid levels or advanced mucosal thickening. No mastoid effusion. Normal orbits. IMPRESSION: 1. No intracranial or calvarial metastatic disease. No acute intracranial abnormality. 2. Chronic microvascular ischemia and mildly age advanced atrophy. Electronically Signed   By: Ulyses Jarred M.D.   On: 09/12/2016 22:36   US Abdomen Complete  Result Date: 08/29/2016 CLINICAL DATA:  Left upper quadrant pain for the past 2-3 weeks. Previous cholecystectomy. History of gastroesophageal reflux. EXAM: ABDOMEN ULTRASOUND COMPLETE COMPARISON:  None in PACs FINDINGS: Gallbladder: The gallbladder is surgically absent. Common bile duct: Diameter: 10 mm. No intraluminal echoes are observed. Liver: The hepatic echotexture is mildly increased diffusely. There is no focal mass nor ductal dilation. IVC: No abnormality visualized. Pancreas: Evaluation of the pancreas was very limited due to bowel gas. Only a small portion of the pancreatic body could be demonstrated. Spleen: The spleen is normal in size and echotexture. Right Kidney: Length: 12.0 cm. The renal cortical echotexture is normal. There is an upper pole cyst measuring 2.7 x 2.4 x 2.3 cm. There is no hydronephrosis. Left Kidney: Length: 11.7 cm. The renal cortical echotexture is similar to that on the right. There is no hydronephrosis. Abdominal aorta: No aneurysm visualized. The aortic bifurcation could not be demonstrated.  Other findings: No ascites is observed. IMPRESSION: Previous cholecystectomy. Increased hepatic echotexture most compatible with fatty infiltrative change. Normal appearance of the spleen and kidneys. Limited visualization of the pancreas and abdominal aorta due to bowel gas. Electronically Signed   By: David  Martinique M.D.   On: 08/29/2016 11:24   Nm Pet Image Initial (pi) Skull Base To Thigh  Result Date: 09/13/2016 CLINICAL DATA:  Initial treatment strategy for left lower lobe lung mass. EXAM: NUCLEAR MEDICINE PET SKULL BASE TO THIGH TECHNIQUE: 13.1 mCi F-18 FDG was injected intravenously. Full-ring PET imaging was performed from the skull base to thigh after the radiotracer. CT data was obtained and used for attenuation correction and anatomic localization. FASTING BLOOD GLUCOSE:  Value: 98 mg/dl COMPARISON:  Chest CT 08/30/2016 FINDINGS: NECK No hypermetabolic lymph nodes in the neck. Probable dilated perivascular space below the right lentiform nucleus as shown on recent MRI. Mildly asymmetric activity along the right posterior glottis, maximum SUV 5.9. A similar area on the left side has maximum SUV 4.0. CHEST Centrally necrotic left lower lobe mass about 8.9 cm in diameter, maximum SUV 24.7. Calcifications in the adjacent truncated tracheobronchial tree possibly due to inspissated mucus. I am unsure whether this mass crosses the major fissure. There is some faint nodularity posteriorly in the left upper lobe measuring about 0.6 by 0.3 cm, not visibly hypermetabolic but also below sensitive PET-CT size thresholds. A small suspected lymph node along the lateral margin of the descending arch measures 4 mm in short axis on image 88/ 3 and has a maximum SUV in of 4.7, mildly above background blood pool activity. There is potentially some nodularity of tumor extending into the sixth intercostal space on images 101-2 of series 3. This is not entirely certain and does not constitute definitive chest wall invasion.  No adjacent bony findings. Scarring or atelectasis in the right upper lobe and right middle lobe medially. ABDOMEN/PELVIS No abnormal hypermetabolic activity within the liver, pancreas,  adrenal glands, or spleen. No hypermetabolic lymph nodes in the abdomen or pelvis. Physiologic activity in the right colon. Aortoiliac atherosclerotic vascular disease. Prior cholecystectomy. Ill-defined accentuated activity in the vicinity of the cervix and upper vagina, maximum SUV approximately 8.7. I am uncertain whether some of this represents motion artifact related to bladder activity. SKELETON No focal hypermetabolic activity to suggest skeletal metastasis. IMPRESSION: 1. Large centrally necrotic left lower lobe mass, 8.9 cm diameter, maximum SUV 24.7. Although primarily in the right lower lobe I am unsure whether this mass invades across the major fissure. 2. Small suspected neoplastic lymph node along the lateral margin of the descending aortic arch, only 4 mm in short axis but mildly hypermetabolic. 3. Questionable extension of tumor nodularity into the sixth intercostal space, although not definitive for chest wall invasion. 4. Small nodule posteriorly in the left upper lobe 0.6 by 0.3 cm, not visibly hypermetabolic but also below sensitive PET-CT size thresholds. 5. No findings of metastatic disease to the abdomen, pelvis, or skeleton. The mass does not appear to directly invade the adjacent left posterior ribs. 6. Ill-defined accentuated activity in the vicinity of the cervix and upper vagina, maximum SUV 8.7. Although possibly artifactual, consider gynecology referral to exclude the unlikely possibility of cervical malignancy. 7.  Aortic Atherosclerosis (ICD10-I70.0). 8. Mildly asymmetric activity along the right upper posterior glottis without definite CT correlate, probably incidental. Electronically Signed   By: Van Clines M.D.   On: 09/13/2016 14:37      IMPRESSION/PLAN: 34.  66 yo woman with stage  cT3N0M0 squamous cell carcinoma of the left lower lung - AJCC 7th - Stage IIB, Today, we talked to the patient and family about the findings and workup thus far. We discussed the natural history of non small cell lung carcinoma and general treatment, highlighting the role of radiation in the management of her disease. We reviewed the consensus for treatment at multidisciplinary conference this morning being to proceed with concurrent chemoradiation.  We anticipate a 6 - 6 1/2 week course of radiotherapy concurrent with systemic chemotherapy.  We discussed the available radiation techniques, and focused on the details of logistics and delivery. We reviewed the anticipated acute and late sequelae associated with radiation in this setting. The patient was encouraged to ask questions that were answered to her satisfaction. She is tentatively scheduled to begin systemic chemotherapy on June 4th.  The patient lives in [place], [place] which is approximately 40 miles away (close to [place], [place]). Since radiation is a daily treatment over a prolonged period, it would be prudent to have her treated closer to home. We discussed the option for treatments in [place] or [place]. She will make a decision regarding this soon, but she is leaning towards [place] at this time. We will forward our notes to the radiation oncologist in [place], Dr. Cruzita Lederer [place] for consideration and anticipation of beginning her course of concurrent EBRTon or near 10/02/16.    Nicholos Johns, PA-C    Tyler Pita, MD  [facility] Direct Dial: [phone]  Fax: [phone] [email or web address]  Skype  [email or web address]  This document serves as a record of services personally performed by Freeman Caldron, PA-C and Tyler Pita, MD. It was created on their behalf by Darcus Austin, a trained medical scribe. The creation of this record is based on the scribe's personal observations and the providers' statements to them. This document has  been checked and approved by the attending provider.

## 2016-09-21 NOTE — Progress Notes (Signed)
START ON PATHWAY REGIMEN - Non-Small Cell Lung     Administer weekly:     Paclitaxel      Carboplatin   **Always confirm dose/schedule in your pharmacy ordering system**    Patient Characteristics: Stage III - Unresectable, PS = 0, 1 AJCC T Category: T4 Current Disease Status: No Distant Mets or Local Recurrence AJCC N Category: N0 AJCC M Category: M0 AJCC 8 Stage Grouping: IIIA Performance Status: PS = 0, 1  Intent of Therapy: Curative Intent, Discussed with Patient

## 2016-09-22 ENCOUNTER — Other Ambulatory Visit: Payer: Self-pay | Admitting: *Deleted

## 2016-09-22 ENCOUNTER — Ambulatory Visit (INDEPENDENT_AMBULATORY_CARE_PROVIDER_SITE_OTHER): Payer: Medicare Other | Admitting: *Deleted

## 2016-09-22 DIAGNOSIS — Z7901 Long term (current) use of anticoagulants: Secondary | ICD-10-CM

## 2016-09-22 LAB — POCT INR: INR: 2

## 2016-09-22 NOTE — Patient Instructions (Signed)
Stop lovenox. Take coumadin 5mg . One tablet on wednesdays and one half tablet all other days.

## 2016-09-26 ENCOUNTER — Telehealth: Payer: Self-pay | Admitting: *Deleted

## 2016-09-26 NOTE — Telephone Encounter (Signed)
Patient to be seen on 09-27-16 @ 8:30 am in [place] by Dr. Quitman Livings and she will also be simmed on 09-27-16 @ 10:20 am, patient is aware of this appt.

## 2016-09-27 ENCOUNTER — Encounter (HOSPITAL_COMMUNITY): Payer: Self-pay

## 2016-09-27 DIAGNOSIS — C3432 Malignant neoplasm of lower lobe, left bronchus or lung: Secondary | ICD-10-CM | POA: Diagnosis not present

## 2016-09-27 DIAGNOSIS — C3492 Malignant neoplasm of unspecified part of left bronchus or lung: Secondary | ICD-10-CM | POA: Diagnosis not present

## 2016-09-29 ENCOUNTER — Telehealth: Payer: Self-pay | Admitting: Medical Oncology

## 2016-09-29 ENCOUNTER — Telehealth: Payer: Self-pay | Admitting: Internal Medicine

## 2016-09-29 ENCOUNTER — Ambulatory Visit (INDEPENDENT_AMBULATORY_CARE_PROVIDER_SITE_OTHER): Payer: Medicare Other

## 2016-09-29 DIAGNOSIS — Z7901 Long term (current) use of anticoagulants: Secondary | ICD-10-CM

## 2016-09-29 LAB — POCT INR: INR: 2.9

## 2016-09-29 NOTE — Patient Instructions (Signed)
Take 1 tablet by mouth on Wednesdays. Then take 1/2 tablet all other days.

## 2016-09-29 NOTE — Telephone Encounter (Signed)
sw pt to confirm chemo class and infusion appts per sch msg

## 2016-09-29 NOTE — Telephone Encounter (Signed)
She has not heard about her chemo appts . She said Dr Julien Nordmann wanted her to start  Monday. In basket message sent.

## 2016-10-02 ENCOUNTER — Other Ambulatory Visit: Payer: Medicare Other

## 2016-10-02 NOTE — Addendum Note (Signed)
Addendum  created 10/02/16 1528 by Sina Lucchesi, MD   Sign clinical note    

## 2016-10-03 ENCOUNTER — Encounter: Payer: Self-pay | Admitting: *Deleted

## 2016-10-03 ENCOUNTER — Other Ambulatory Visit: Payer: Medicare Other

## 2016-10-03 NOTE — Progress Notes (Signed)
Oncology Nurse Navigator Documentation  Oncology Nurse Navigator Flowsheets 10/03/2016  Navigator Location [facility]  Navigator Encounter Type Other/I received a call from [place] at [employer] in [place]. I called her back. She wanted an update on Sarah [name]'s schedule.  I updated and faxed her a copy of schedule.   Treatment Phase Pre-Tx/Tx Discussion  Barriers/Navigation Needs Coordination of Care  Interventions Coordination of Care  Coordination of Care Other  Acuity Level 2  Acuity Level 2 Other  Time Spent with Patient 30

## 2016-10-04 ENCOUNTER — Encounter: Payer: Self-pay | Admitting: *Deleted

## 2016-10-04 ENCOUNTER — Encounter: Payer: Self-pay | Admitting: Internal Medicine

## 2016-10-04 NOTE — Progress Notes (Signed)
Called to introduce myself as her Arboriculturist.  Pt has 2 insurance so copay assistance shouldn't be needed.  I informed her of the [place] and went over what it covers.  She would like to apply so she will provide me with proof of income on 10/09/16.  If approved I will give her an expense sheet.

## 2016-10-04 NOTE — Progress Notes (Signed)
Oncology Nurse Navigator Documentation  Oncology Nurse Navigator Flowsheets 10/04/2016  Navigator Location [facility]  Navigator Encounter Type Other/I called [facility] to get an update on when patient's tx will start.  Patient will receive 1st rad onc treatment on 6/11. Patient is also set up for 1st chemo on 6/11.  I will notify Dr. Julien Nordmann that patient is set up for treatment.   Treatment Phase Pre-Tx/Tx Discussion  Barriers/Navigation Needs Coordination of Care  Interventions Coordination of Care  Coordination of Care Other  Acuity Level 1  Time Spent with Patient 15

## 2016-10-05 DIAGNOSIS — C3492 Malignant neoplasm of unspecified part of left bronchus or lung: Secondary | ICD-10-CM | POA: Diagnosis not present

## 2016-10-05 DIAGNOSIS — C3432 Malignant neoplasm of lower lobe, left bronchus or lung: Secondary | ICD-10-CM | POA: Diagnosis not present

## 2016-10-06 DIAGNOSIS — C3432 Malignant neoplasm of lower lobe, left bronchus or lung: Secondary | ICD-10-CM | POA: Diagnosis not present

## 2016-10-06 DIAGNOSIS — C3492 Malignant neoplasm of unspecified part of left bronchus or lung: Secondary | ICD-10-CM | POA: Diagnosis not present

## 2016-10-09 ENCOUNTER — Other Ambulatory Visit (HOSPITAL_BASED_OUTPATIENT_CLINIC_OR_DEPARTMENT_OTHER): Payer: Medicare Other

## 2016-10-09 ENCOUNTER — Encounter: Payer: Self-pay | Admitting: Internal Medicine

## 2016-10-09 ENCOUNTER — Ambulatory Visit (HOSPITAL_BASED_OUTPATIENT_CLINIC_OR_DEPARTMENT_OTHER): Payer: Medicare Other

## 2016-10-09 VITALS — BP 114/65 | HR 62 | Temp 98.6°F | Resp 18

## 2016-10-09 DIAGNOSIS — C3432 Malignant neoplasm of lower lobe, left bronchus or lung: Secondary | ICD-10-CM

## 2016-10-09 DIAGNOSIS — Z5111 Encounter for antineoplastic chemotherapy: Secondary | ICD-10-CM

## 2016-10-09 DIAGNOSIS — C3492 Malignant neoplasm of unspecified part of left bronchus or lung: Secondary | ICD-10-CM

## 2016-10-09 DIAGNOSIS — G4733 Obstructive sleep apnea (adult) (pediatric): Secondary | ICD-10-CM

## 2016-10-09 DIAGNOSIS — I4891 Unspecified atrial fibrillation: Secondary | ICD-10-CM

## 2016-10-09 DIAGNOSIS — Z7189 Other specified counseling: Secondary | ICD-10-CM

## 2016-10-09 LAB — COMPREHENSIVE METABOLIC PANEL
ALT: 11 U/L (ref 0–55)
AST: 12 U/L (ref 5–34)
Albumin: 3.4 g/dL — ABNORMAL LOW (ref 3.5–5.0)
Alkaline Phosphatase: 131 U/L (ref 40–150)
Anion Gap: 12 mEq/L — ABNORMAL HIGH (ref 3–11)
BUN: 9.8 mg/dL (ref 7.0–26.0)
CHLORIDE: 101 meq/L (ref 98–109)
CO2: 28 meq/L (ref 22–29)
Calcium: 10.4 mg/dL (ref 8.4–10.4)
Creatinine: 1.1 mg/dL (ref 0.6–1.1)
EGFR: 54 mL/min/{1.73_m2} — AB (ref >90)
GLUCOSE: 105 mg/dL (ref 70–140)
POTASSIUM: 4 meq/L (ref 3.5–5.1)
SODIUM: 142 meq/L (ref 136–145)
Total Bilirubin: 0.36 mg/dL (ref 0.20–1.20)
Total Protein: 8.1 g/dL (ref 6.4–8.3)

## 2016-10-09 LAB — CBC WITH DIFFERENTIAL/PLATELET
BASO%: 0.3 % (ref 0.0–2.0)
BASOS ABS: 0 10*3/uL (ref 0.0–0.1)
EOS%: 1.7 % (ref 0.0–7.0)
Eosinophils Absolute: 0.2 10*3/uL (ref 0.0–0.5)
HCT: 41 % (ref 34.8–46.6)
HGB: 12.8 g/dL (ref 11.6–15.9)
LYMPH%: 14.2 % (ref 14.0–49.7)
MCH: 27 pg (ref 25.1–34.0)
MCHC: 31.2 g/dL — ABNORMAL LOW (ref 31.5–36.0)
MCV: 86.5 fL (ref 79.5–101.0)
MONO#: 1 10*3/uL — ABNORMAL HIGH (ref 0.1–0.9)
MONO%: 9.9 % (ref 0.0–14.0)
NEUT%: 73.9 % (ref 38.4–76.8)
NEUTROS ABS: 7.2 10*3/uL — AB (ref 1.5–6.5)
Platelets: 351 10*3/uL (ref 145–400)
RBC: 4.74 10*6/uL (ref 3.70–5.45)
RDW: 15.7 % — ABNORMAL HIGH (ref 11.2–14.5)
WBC: 9.8 10*3/uL (ref 3.9–10.3)
lymph#: 1.4 10*3/uL (ref 0.9–3.3)

## 2016-10-09 MED ORDER — PACLITAXEL CHEMO INJECTION 300 MG/50ML
45.0000 mg/m2 | Freq: Once | INTRAVENOUS | Status: AC
  Administered 2016-10-09: 108 mg via INTRAVENOUS
  Filled 2016-10-09: qty 18

## 2016-10-09 MED ORDER — PALONOSETRON HCL INJECTION 0.25 MG/5ML
0.2500 mg | Freq: Once | INTRAVENOUS | Status: AC
  Administered 2016-10-09: 0.25 mg via INTRAVENOUS

## 2016-10-09 MED ORDER — SODIUM CHLORIDE 0.9 % IV SOLN
50.0000 mg | Freq: Once | INTRAVENOUS | Status: AC
  Administered 2016-10-09: 50 mg via INTRAVENOUS
  Filled 2016-10-09: qty 1

## 2016-10-09 MED ORDER — FAMOTIDINE IN NACL 20-0.9 MG/50ML-% IV SOLN
INTRAVENOUS | Status: AC
Start: 2016-10-09 — End: ?
  Filled 2016-10-09: qty 50

## 2016-10-09 MED ORDER — DIPHENHYDRAMINE HCL 50 MG/ML IJ SOLN: 50.0000 mg | Freq: Once | INTRAMUSCULAR | Status: DC

## 2016-10-09 MED ORDER — SODIUM CHLORIDE 0.9 % IV SOLN
Freq: Once | INTRAVENOUS | Status: AC
  Administered 2016-10-09: 09:00:00 via INTRAVENOUS

## 2016-10-09 MED ORDER — FAMOTIDINE IN NACL 20-0.9 MG/50ML-% IV SOLN
20.0000 mg | Freq: Once | INTRAVENOUS | Status: AC
  Administered 2016-10-09: 20 mg via INTRAVENOUS

## 2016-10-09 MED ORDER — SODIUM CHLORIDE 0.9 % IV SOLN
259.2000 mg | Freq: Once | INTRAVENOUS | Status: AC
  Administered 2016-10-09: 260 mg via INTRAVENOUS
  Filled 2016-10-09: qty 26

## 2016-10-09 MED ORDER — PALONOSETRON HCL INJECTION 0.25 MG/5ML
INTRAVENOUS | Status: AC
  Filled 2016-10-09: qty 5

## 2016-10-09 MED ORDER — SODIUM CHLORIDE 0.9 % IV SOLN
20.0000 mg | Freq: Once | INTRAVENOUS | Status: AC
  Administered 2016-10-09: 20 mg via INTRAVENOUS
  Filled 2016-10-09: qty 2

## 2016-10-09 NOTE — Progress Notes (Signed)
Pt was going to provide me w/ proof of income to apply for the [employer] but after researching she exceeds the income requirement.

## 2016-10-09 NOTE — Patient Instructions (Signed)
[facility] Discharge Instructions for Patients Receiving Chemotherapy  Today you received the following chemotherapy agents Taxol /Carboplatin  To help prevent nausea and vomiting after your treatment, we encourage you to take your nausea medication as needed   If you develop nausea and vomiting that is not controlled by your nausea medication, call the clinic.   BELOW ARE SYMPTOMS THAT SHOULD BE REPORTED IMMEDIATELY:  *FEVER GREATER THAN 100.5 F  *CHILLS WITH OR WITHOUT FEVER  NAUSEA AND VOMITING THAT IS NOT CONTROLLED WITH YOUR NAUSEA MEDICATION  *UNUSUAL SHORTNESS OF BREATH  *UNUSUAL BRUISING OR BLEEDING  TENDERNESS IN MOUTH AND THROAT WITH OR WITHOUT PRESENCE OF ULCERS  *URINARY PROBLEMS  *BOWEL PROBLEMS  UNUSUAL RASH Items with * indicate a potential emergency and should be followed up as soon as possible.  Feel free to call the clinic you have any questions or concerns. The clinic phone number is [phone].  Please show the [facility] at check-in to the Emergency Department and triage nurse.

## 2016-10-10 ENCOUNTER — Telehealth: Payer: Self-pay | Admitting: Medical Oncology

## 2016-10-10 DIAGNOSIS — C3432 Malignant neoplasm of lower lobe, left bronchus or lung: Secondary | ICD-10-CM | POA: Diagnosis not present

## 2016-10-10 NOTE — Telephone Encounter (Signed)
I left message for pt to return call with update how she is doing after chemo yesterday.

## 2016-10-11 ENCOUNTER — Emergency Department (HOSPITAL_COMMUNITY): Payer: Medicare Other

## 2016-10-11 ENCOUNTER — Emergency Department (HOSPITAL_COMMUNITY)
Admission: EM | Admit: 2016-10-11 | Discharge: 2016-10-12 | Disposition: A | Discharge status: Home / Self Care | Payer: Medicare Other | Source: Home / Self Care | Attending: Emergency Medicine | Admitting: Emergency Medicine

## 2016-10-11 ENCOUNTER — Telehealth: Payer: Self-pay

## 2016-10-11 ENCOUNTER — Encounter (HOSPITAL_COMMUNITY): Payer: Self-pay | Admitting: *Deleted

## 2016-10-11 DIAGNOSIS — T402X5A Adverse effect of other opioids, initial encounter: Principal | ICD-10-CM

## 2016-10-11 DIAGNOSIS — T400X5A Adverse effect of opium, initial encounter: Secondary | ICD-10-CM | POA: Insufficient documentation

## 2016-10-11 DIAGNOSIS — Z87891 Personal history of nicotine dependence: Secondary | ICD-10-CM | POA: Insufficient documentation

## 2016-10-11 DIAGNOSIS — K5903 Drug induced constipation: Secondary | ICD-10-CM | POA: Insufficient documentation

## 2016-10-11 DIAGNOSIS — R1012 Left upper quadrant pain: Secondary | ICD-10-CM | POA: Diagnosis not present

## 2016-10-11 DIAGNOSIS — Z79899 Other long term (current) drug therapy: Secondary | ICD-10-CM

## 2016-10-11 DIAGNOSIS — C3492 Malignant neoplasm of unspecified part of left bronchus or lung: Secondary | ICD-10-CM | POA: Insufficient documentation

## 2016-10-11 DIAGNOSIS — Z7901 Long term (current) use of anticoagulants: Secondary | ICD-10-CM | POA: Insufficient documentation

## 2016-10-11 DIAGNOSIS — Z9049 Acquired absence of other specified parts of digestive tract: Secondary | ICD-10-CM

## 2016-10-11 NOTE — ED Triage Notes (Signed)
Pt c/o feeling bloated, reports that she is constipated and that her last "good" bowel movement was over 2 weeks ago,

## 2016-10-11 NOTE — Telephone Encounter (Signed)
Nothing else right now.

## 2016-10-11 NOTE — Telephone Encounter (Signed)
Pt called with question before xrt today.  Had 1st XRT yesterday. Her back hurt so bad she could hardly lay there. Norco 7.5-325 barely helped it. Pain continued through the night.  No good BM in almost 2 weeks. She expells little hard balls and a small amount of very soft stool. She took mag citrate Monday and drank the whole bottle. She uses miralax 1 cap once a day.  She is drinking about 32 oz fluid /day.  This RN discussed impaction and using colace, increasing miralax to as much as 3 x/day, increasing water to 64 oz/ day. And possibly using another bottle of mag citrate. Also that Dr Julien Nordmann may want to rx something more, we will call her.  XRT is at 3 this afternoon.  Husbands cell # [phone] if cannot get pt at home.  Taxol/carbo D1C1 on 10/09/16

## 2016-10-12 ENCOUNTER — Telehealth: Payer: Self-pay | Admitting: *Deleted

## 2016-10-12 ENCOUNTER — Inpatient Hospital Stay (HOSPITAL_COMMUNITY)
Admission: EM | Admit: 2016-10-12 | Discharge: 2016-10-14 | DRG: 392 | Disposition: A | Discharge status: Home / Self Care | Payer: Medicare Other | Attending: Internal Medicine | Admitting: Internal Medicine

## 2016-10-12 ENCOUNTER — Other Ambulatory Visit: Payer: Self-pay | Admitting: Family Medicine

## 2016-10-12 ENCOUNTER — Encounter (HOSPITAL_COMMUNITY): Payer: Self-pay | Admitting: Emergency Medicine

## 2016-10-12 ENCOUNTER — Emergency Department (HOSPITAL_COMMUNITY): Payer: Medicare Other

## 2016-10-12 DIAGNOSIS — C3490 Malignant neoplasm of unspecified part of unspecified bronchus or lung: Secondary | ICD-10-CM | POA: Diagnosis not present

## 2016-10-12 DIAGNOSIS — C3432 Malignant neoplasm of lower lobe, left bronchus or lung: Secondary | ICD-10-CM | POA: Diagnosis present

## 2016-10-12 DIAGNOSIS — K219 Gastro-esophageal reflux disease without esophagitis: Secondary | ICD-10-CM | POA: Diagnosis present

## 2016-10-12 DIAGNOSIS — G4733 Obstructive sleep apnea (adult) (pediatric): Secondary | ICD-10-CM

## 2016-10-12 DIAGNOSIS — I480 Paroxysmal atrial fibrillation: Secondary | ICD-10-CM | POA: Diagnosis present

## 2016-10-12 DIAGNOSIS — I482 Chronic atrial fibrillation, unspecified: Secondary | ICD-10-CM | POA: Diagnosis present

## 2016-10-12 DIAGNOSIS — R110 Nausea: Secondary | ICD-10-CM | POA: Diagnosis not present

## 2016-10-12 DIAGNOSIS — Z886 Allergy status to analgesic agent status: Secondary | ICD-10-CM

## 2016-10-12 DIAGNOSIS — R1114 Bilious vomiting: Secondary | ICD-10-CM | POA: Diagnosis not present

## 2016-10-12 DIAGNOSIS — T451X5A Adverse effect of antineoplastic and immunosuppressive drugs, initial encounter: Secondary | ICD-10-CM | POA: Diagnosis not present

## 2016-10-12 DIAGNOSIS — C3492 Malignant neoplasm of unspecified part of left bronchus or lung: Secondary | ICD-10-CM | POA: Diagnosis not present

## 2016-10-12 DIAGNOSIS — Z79899 Other long term (current) drug therapy: Secondary | ICD-10-CM

## 2016-10-12 DIAGNOSIS — R112 Nausea with vomiting, unspecified: Principal | ICD-10-CM | POA: Diagnosis present

## 2016-10-12 DIAGNOSIS — K5903 Drug induced constipation: Secondary | ICD-10-CM | POA: Diagnosis present

## 2016-10-12 DIAGNOSIS — T40605A Adverse effect of unspecified narcotics, initial encounter: Secondary | ICD-10-CM | POA: Diagnosis present

## 2016-10-12 DIAGNOSIS — Z7901 Long term (current) use of anticoagulants: Secondary | ICD-10-CM

## 2016-10-12 DIAGNOSIS — Z87891 Personal history of nicotine dependence: Secondary | ICD-10-CM

## 2016-10-12 HISTORY — DX: Malignant (primary) neoplasm, unspecified: C80.1

## 2016-10-12 LAB — CBC WITH DIFFERENTIAL/PLATELET
Basophils Absolute: 0 10*3/uL (ref 0.0–0.1)
Basophils Relative: 0 %
EOS ABS: 0 10*3/uL (ref 0.0–0.7)
Eosinophils Relative: 0 %
HCT: 40.4 % (ref 36.0–46.0)
HEMOGLOBIN: 12.9 g/dL (ref 12.0–15.0)
Lymphocytes Relative: 11 %
Lymphs Abs: 1 10*3/uL (ref 0.7–4.0)
MCH: 26.9 pg (ref 26.0–34.0)
MCHC: 31.9 g/dL (ref 30.0–36.0)
MCV: 84.2 fL (ref 78.0–100.0)
Monocytes Absolute: 0.5 10*3/uL (ref 0.1–1.0)
Monocytes Relative: 6 %
NEUTROS PCT: 83 %
Neutro Abs: 7.3 10*3/uL (ref 1.7–7.7)
Platelets: 325 10*3/uL (ref 150–400)
RBC: 4.8 MIL/uL (ref 3.87–5.11)
RDW: 15.8 % — ABNORMAL HIGH (ref 11.5–15.5)
WBC: 8.8 10*3/uL (ref 4.0–10.5)

## 2016-10-12 LAB — URINALYSIS, ROUTINE W REFLEX MICROSCOPIC
Bacteria, UA: NONE SEEN
Bilirubin Urine: NEGATIVE
GLUCOSE, UA: NEGATIVE mg/dL
HGB URINE DIPSTICK: NEGATIVE
KETONES UR: NEGATIVE mg/dL
NITRITE: NEGATIVE
PROTEIN: NEGATIVE mg/dL
Specific Gravity, Urine: >1.046 — ABNORMAL HIGH (ref 1.005–1.030)
pH: 6 (ref 5.0–8.0)

## 2016-10-12 LAB — COMPREHENSIVE METABOLIC PANEL
ALK PHOS: 110 U/L (ref 38–126)
ALT: 20 U/L (ref 14–54)
ANION GAP: 12 (ref 5–15)
AST: 17 U/L (ref 15–41)
Albumin: 3.7 g/dL (ref 3.5–5.0)
BUN: 15 mg/dL (ref 6–20)
CALCIUM: 9.1 mg/dL (ref 8.9–10.3)
CO2: 27 mmol/L (ref 22–32)
Chloride: 98 mmol/L — ABNORMAL LOW (ref 101–111)
Creatinine, Ser: 1.02 mg/dL — ABNORMAL HIGH (ref 0.44–1.00)
GFR calc non Af Amer: 56 mL/min — ABNORMAL LOW (ref >60)
Glucose, Bld: 130 mg/dL — ABNORMAL HIGH (ref 65–99)
Potassium: 3.7 mmol/L (ref 3.5–5.1)
SODIUM: 137 mmol/L (ref 135–145)
Total Bilirubin: 0.7 mg/dL (ref 0.3–1.2)
Total Protein: 7.8 g/dL (ref 6.5–8.1)

## 2016-10-12 LAB — PROTIME-INR
INR: 3.06
Prothrombin Time: 32.3 seconds — ABNORMAL HIGH (ref 11.4–15.2)

## 2016-10-12 LAB — LIPASE, BLOOD: LIPASE: 25 U/L (ref 11–51)

## 2016-10-12 MED ORDER — ZOLPIDEM TARTRATE 5 MG PO TABS
5.0000 mg | ORAL_TABLET | Freq: Every evening | ORAL | Status: DC | PRN
  Administered 2016-10-12: 5 mg via ORAL
  Filled 2016-10-12: qty 1

## 2016-10-12 MED ORDER — ACETAMINOPHEN 325 MG PO TABS
650.0000 mg | ORAL_TABLET | Freq: Four times a day (QID) | ORAL | Status: DC | PRN
  Administered 2016-10-13: 650 mg via ORAL
  Filled 2016-10-12: qty 2

## 2016-10-12 MED ORDER — FENTANYL CITRATE (PF) 100 MCG/2ML IJ SOLN
50.0000 ug | Freq: Once | INTRAMUSCULAR | Status: AC
  Administered 2016-10-12: 50 ug via INTRAVENOUS
  Filled 2016-10-12: qty 2

## 2016-10-12 MED ORDER — BOOST / RESOURCE BREEZE PO LIQD: 1.0000 | Freq: Three times a day (TID) | ORAL | Status: DC

## 2016-10-12 MED ORDER — MAGNESIUM CITRATE PO SOLN: 1.0000 | Freq: Once | ORAL | Status: DC | PRN

## 2016-10-12 MED ORDER — FENTANYL CITRATE (PF) 100 MCG/2ML IJ SOLN
25.0000 ug | INTRAMUSCULAR | Status: DC | PRN
  Administered 2016-10-12: 25 ug via INTRAVENOUS
  Administered 2016-10-13 (×2): 50 ug via INTRAVENOUS
  Administered 2016-10-14: 25 ug via INTRAVENOUS
  Administered 2016-10-14: 50 ug via INTRAVENOUS
  Filled 2016-10-12 (×5): qty 2

## 2016-10-12 MED ORDER — ONDANSETRON HCL 4 MG/2ML IJ SOLN
4.0000 mg | Freq: Four times a day (QID) | INTRAMUSCULAR | Status: DC | PRN
  Administered 2016-10-13: 4 mg via INTRAVENOUS
  Filled 2016-10-12: qty 2

## 2016-10-12 MED ORDER — PROMETHAZINE HCL 25 MG/ML IJ SOLN
12.5000 mg | Freq: Four times a day (QID) | INTRAMUSCULAR | Status: DC | PRN
  Administered 2016-10-13 – 2016-10-14 (×3): 12.5 mg via INTRAVENOUS
  Filled 2016-10-12 (×3): qty 1

## 2016-10-12 MED ORDER — FLECAINIDE ACETATE 50 MG PO TABS
50.0000 mg | ORAL_TABLET | Freq: Two times a day (BID) | ORAL | Status: DC
  Administered 2016-10-13 – 2016-10-14 (×4): 50 mg via ORAL
  Filled 2016-10-12 (×9): qty 1

## 2016-10-12 MED ORDER — BISACODYL 5 MG PO TBEC: 5.0000 mg | DELAYED_RELEASE_TABLET | Freq: Every day | ORAL | Status: DC | PRN

## 2016-10-12 MED ORDER — DILTIAZEM HCL ER COATED BEADS 180 MG PO CP24
180.0000 mg | ORAL_CAPSULE | Freq: Every day | ORAL | Status: DC
  Administered 2016-10-13 – 2016-10-14 (×2): 180 mg via ORAL
  Filled 2016-10-12 (×2): qty 1

## 2016-10-12 MED ORDER — METHYLNALTREXONE BROMIDE 12 MG/0.6ML ~~LOC~~ SOLN
12.0000 mg | Freq: Once | SUBCUTANEOUS | Status: AC
  Administered 2016-10-12: 12 mg via SUBCUTANEOUS
  Filled 2016-10-12: qty 0.6

## 2016-10-12 MED ORDER — LORAZEPAM 0.5 MG PO TABS: 0.5000 mg | ORAL_TABLET | Freq: Four times a day (QID) | ORAL | Status: DC | PRN

## 2016-10-12 MED ORDER — SODIUM CHLORIDE 0.9 % IV SOLN: INTRAVENOUS | Status: AC

## 2016-10-12 MED ORDER — SODIUM CHLORIDE 0.9 % IV BOLUS (SEPSIS)
750.0000 mL | Freq: Once | INTRAVENOUS | Status: AC
  Administered 2016-10-12: 750 mL via INTRAVENOUS

## 2016-10-12 MED ORDER — BUPROPION HCL ER (SR) 150 MG PO TB12
150.0000 mg | ORAL_TABLET | Freq: Every day | ORAL | Status: DC
  Administered 2016-10-13 – 2016-10-14 (×2): 150 mg via ORAL
  Filled 2016-10-12 (×2): qty 1

## 2016-10-12 MED ORDER — ONDANSETRON HCL 4 MG/2ML IJ SOLN
4.0000 mg | Freq: Once | INTRAMUSCULAR | Status: AC
  Administered 2016-10-12: 4 mg via INTRAVENOUS
  Filled 2016-10-12: qty 2

## 2016-10-12 MED ORDER — PANTOPRAZOLE SODIUM 40 MG PO TBEC
40.0000 mg | DELAYED_RELEASE_TABLET | Freq: Every morning | ORAL | Status: DC
  Administered 2016-10-13 – 2016-10-14 (×2): 40 mg via ORAL
  Filled 2016-10-12 (×2): qty 1

## 2016-10-12 MED ORDER — IOPAMIDOL (ISOVUE-300) INJECTION 61%
100.0000 mL | Freq: Once | INTRAVENOUS | Status: AC | PRN
Start: 2016-10-12 — End: 2016-10-12
  Administered 2016-10-12: 100 mL via INTRAVENOUS

## 2016-10-12 MED ORDER — FLEET ENEMA 7-19 GM/118ML RE ENEM
1.0000 | ENEMA | Freq: Once | RECTAL | Status: AC
  Administered 2016-10-12: 1 via RECTAL

## 2016-10-12 MED ORDER — ONDANSETRON HCL 4 MG PO TABS: 4.0000 mg | ORAL_TABLET | Freq: Four times a day (QID) | ORAL | Status: DC | PRN

## 2016-10-12 MED ORDER — METOCLOPRAMIDE HCL 5 MG/ML IJ SOLN
10.0000 mg | Freq: Once | INTRAMUSCULAR | Status: AC
  Administered 2016-10-12: 10 mg via INTRAVENOUS
  Filled 2016-10-12: qty 2

## 2016-10-12 MED ORDER — POLYETHYLENE GLYCOL 3350 17 G PO PACK: 17.0000 g | PACK | Freq: Every day | ORAL | Status: DC | PRN

## 2016-10-12 MED ORDER — FLECAINIDE ACETATE 100 MG PO TABS
ORAL_TABLET | ORAL | Status: AC
  Filled 2016-10-12: qty 1

## 2016-10-12 MED ORDER — SODIUM CHLORIDE 0.9 % IV SOLN
INTRAVENOUS | Status: DC
  Administered 2016-10-12: 18:00:00 via INTRAVENOUS

## 2016-10-12 MED ORDER — ACETAMINOPHEN 650 MG RE SUPP: 650.0000 mg | Freq: Four times a day (QID) | RECTAL | Status: DC | PRN

## 2016-10-12 NOTE — ED Notes (Signed)
Pt has had 2 liquid stools since being here one before and after enema. edp notified.

## 2016-10-12 NOTE — ED Provider Notes (Signed)
[facility] DEPT Provider Note   CSN: [id number] Arrival date & time: 10/12/16  1720     History   Chief Complaint Chief Complaint  Patient presents with  . Nausea    HPI ARIAN MCQUITTY is a 66 y.o. female.  HPI Patient presents for second time in 24 hours with concern for ongoing nausea, by mouth intolerance, abdominal discomfort. She also has new concern of diffuse pain throughout the back, neck, generalized unsettled sensation. Patient has a notable history of ongoing therapy for metastatic lung cancer, with first dose of chemotherapy and radiation within the past 7 days. She notes that she is taking pain medication, and has had significant constipation recently. However, since evaluation here earlier, with enema, the patient has had some loose stool production, though nothing consistently, and with ongoing nausea, anorexia. No new focal fever, chills, but the patient describe generalized discomfort. Patient attempted to contact her oncology service today, but had no success, and returns here for evaluation.  Past Medical History:  Diagnosis Date  . Arthritis   . Cancer ([place])    lung cancer  . Chronic anxiety    Patient describes as stress  . Chronic back pain   . DVT (deep venous thrombosis) ([place]) age 78  . GERD (gastroesophageal reflux disease)   . Goals of care, counseling/discussion 09/21/2016  . Headache(784.0)   . Hypoestrogenism   . Lower extremity venous stasis    LLE, chronic  . Obesity   . Paroxysmal atrial fibrillation ([facility])   . PONV (postoperative nausea and vomiting)   . Pulmonary embolism ([place])   . Reflux   . Sleep apnea    wear CPAP  . Spinal stenosis   . Tobacco abuse   . Venous stasis   . Warfarin anticoagulation     Patient Active Problem List   Diagnosis Date Noted  . Goals of care, counseling/discussion 09/21/2016  . Encounter for antineoplastic chemotherapy 09/21/2016  . Squamous cell carcinoma of lung, left ([place]) 09/20/2016  .  Lung mass 09/15/2016  . Arthritis 06/23/2013  . Atrial fibrillation with RVR ([place]) 08/30/2012  . Obesity, unspecified 08/30/2012  . Hypokalemia 08/30/2012  . Long term current use of anticoagulant therapy 08/03/2012  . OSA (obstructive sleep apnea) 05/23/2012  . History of pulmonary embolism 12/28/2011  . Tobacco abuse 12/28/2011  . Chronic anxiety 12/28/2011  . GERD (gastroesophageal reflux disease) 12/28/2011  . Warfarin anticoagulation 12/28/2011    Past Surgical History:  Procedure Laterality Date  . ANKLE SURGERY     x2  . APPENDECTOMY    . CARPAL TUNNEL RELEASE    . CHOLECYSTECTOMY    . OVARIAN CYST REMOVAL    . ROTATOR CUFF REPAIR    . TUBAL LIGATION    . VIDEO BRONCHOSCOPY WITH ENDOBRONCHIAL ULTRASOUND N/A 09/14/2016   Procedure: VIDEO BRONCHOSCOPY WITH ENDOBRONCHIAL ULTRASOUND;  Surgeon: Melrose Nakayama, MD;  Location: [facility] OR;  Service: Thoracic;  Laterality: N/A;    OB History    No data available       Home Medications    Prior to Admission medications   Medication Sig Start Date End Date Taking? Authorizing Provider  buPROPion (WELLBUTRIN SR) 150 MG 12 hr tablet TAKE 1 TABLET BY MOUTH TWICE DAILY **KEEP APPT ON 06/09/16** Patient taking differently: Take 150 mg by mouth daily.  09/01/16  Yes [name], MD  calcium carbonate (TUMS - DOSED IN MG ELEMENTAL CALCIUM) 500 MG chewable tablet Chew 1 tablet by mouth 2 (two) times  a week.    Yes [provider]  diltiazem (CARDIZEM CD) 180 MG 24 hr capsule TAKE ONE CAPSULE BY MOUTH EVERY DAY 10/12/16  Yes [name], MD  flecainide (TAMBOCOR) 50 MG tablet TAKE 1 TABLET (50 MG TOTAL) BY MOUTH 2 (TWO) TIMES DAILY. 09/12/16  Yes Satira Sark, MD  HYDROcodone-acetaminophen ([place]) 7.5-325 MG tablet One tablet up to four times a day as needed for pain Patient taking differently: Take 1 tablet by mouth 4 (four) times daily as needed for moderate pain.  09/05/16  Yes [name], MD    ondansetron (ZOFRAN ODT) 4 MG disintegrating tablet Take 1 tablet by mouth every 6 hours as needed for nausea. 09/18/16  Yes [name], MD  pantoprazole (PROTONIX) 40 MG tablet TAKE 1 TABLET EVERY MORNING 10/12/16  Yes [name], MD  sucralfate (CARAFATE) 1 g tablet Take 1 tablet (1 g total) by mouth 2 (two) times daily. Patient taking differently: Take 1 g by mouth 2 (two) times daily as needed (acid reflux).  08/01/16  Yes [name], MD  warfarin (COUMADIN) 5 MG tablet TAKE 1 TABLET BY MOUTH EVERY DAY AS DIRECTED Patient taking differently: Take 2.5 mg by mouth daily except take 5mg  on Wednesdays. Takes in the evening 06/16/16  Yes [name], MD  enoxaparin (LOVENOX) 120 MG/0.8ML injection 115 mg every 12 hours as directed by physician Patient not taking: Reported on 10/02/2016 09/19/16   [name], MD  metoprolol tartrate (LOPRESSOR) 25 MG tablet 1 tablet as needed for heart racing Patient not taking: Reported on 09/21/2016 05/06/15   [name], MD    Family History Family History  Problem Relation Age of Onset  . Arrhythmia Mother   . Cancer Mother        colon  . Heart disease Mother   . Stroke Father        Deceased    Social History Social History  Substance Use Topics  . Smoking status: Former Smoker    Packs/day: 1.00    Years: 40.00    Types: Cigarettes    Quit date: 08/29/2016  . Smokeless tobacco: Never Used  . Alcohol use No     Allergies   Aspirin   Review of Systems Review of Systems  Constitutional:       Per HPI, otherwise negative  HENT:       Per HPI, otherwise negative  Respiratory:       Per HPI, otherwise negative  Cardiovascular:       Per HPI, otherwise negative  Gastrointestinal: Positive for abdominal pain, constipation, diarrhea, nausea and vomiting.  Endocrine:       Negative aside from HPI  Genitourinary:       Neg aside from HPI   Musculoskeletal:       Per HPI, otherwise negative  Skin:  Negative.   Allergic/Immunologic: Positive for immunocompromised state.  Neurological: Positive for weakness. Negative for syncope.     Physical Exam Updated Vital Signs BP (!) 109/45   Pulse (!) 56   Temp 98.2 F (36.8 C) (Oral)   Resp 18   Ht 5\' 6"  (1.676 m)   Wt 117.9 kg (260 lb)   SpO2 97%   BMI 41.97 kg/m   Physical Exam  Constitutional: She is oriented to person, place, and time. She appears well-developed and well-nourished.  Non-toxic appearance. She does not appear ill. No distress.  Obese   HENT:  Head:  Normocephalic and atraumatic.  Nose: No mucosal edema or rhinorrhea.  Mouth/Throat: Mucous membranes are normal. No dental abscesses or uvula swelling.  Eyes: Conjunctivae are normal.  Neck: Full passive range of motion without pain. No thyromegaly present.  Cardiovascular: Normal rate, regular rhythm and normal heart sounds.   Pulmonary/Chest: Effort normal and breath sounds normal. No stridor. No respiratory distress. She has no rhonchi. She exhibits no crepitus.  Abdominal: Soft. Normal appearance. She exhibits no distension. There is no tenderness.  Musculoskeletal: She exhibits no deformity.  Moves all extremities well.   Neurological: She is alert and oriented to person, place, and time. She has normal strength. No cranial nerve deficit. She exhibits normal muscle tone.  Skin: Skin is warm, dry and intact. No rash noted. No erythema. No pallor.  Psychiatric: She has a normal mood and affect. Her speech is normal and behavior is normal. Her mood appears not anxious.  Nursing note and vitals reviewed.    ED Treatments / Results  Labs (all labs ordered are listed, but only abnormal results are displayed) Labs Reviewed  COMPREHENSIVE METABOLIC PANEL - Abnormal; Notable for the following:       Result Value   Chloride 98 (*)    Glucose, Bld 130 (*)    Creatinine, Ser 1.02 (*)    GFR calc non Af Amer 56 (*)    All other components within normal limits  CBC  WITH DIFFERENTIAL/PLATELET - Abnormal; Notable for the following:    RDW 15.8 (*)    All other components within normal limits  LIPASE, BLOOD    Cardiac monitor 60 sinus normal    Radiology Ct Abdomen Pelvis W Contrast  Result Date: 10/12/2016 CLINICAL DATA:  Nausea, chills, and headaches. Last bowel movement 2 weeks prior to imaging. Lung cancer. EXAM: CT ABDOMEN AND PELVIS WITH CONTRAST TECHNIQUE: Multidetector CT imaging of the abdomen and pelvis was performed using the standard protocol following bolus administration of intravenous contrast. CONTRAST:  131mL ISOVUE-300 IOPAMIDOL (ISOVUE-300) INJECTION 61% COMPARISON:  09/13/2016 and 09/27/2016 cross-sectional imaging exams. FINDINGS: Lower chest: Large left lower lobe lung mass with adjacent passive atelectasis, mass measuring about 7.3 cm in long axis on image 1/2. There is some fatty lobulation of the pleura along the inferior margin, along with a small loculated pleural effusion inferomedial to the mass. Hepatobiliary: Mild intrahepatic biliary dilatation and common hepatic duct dilatation up to 1.4 cm, probably a physiologic response to cholecystectomy. Distally the CBD tapers to a more normal level. Pancreas: Unremarkable Spleen: Unremarkable Adrenals/Urinary Tract: 2.6 by 2.4 cm right adrenal adenoma or myelolipoma, small amount of fatty content on image 31/2. Right kidney upper pole cyst.  Urinary bladder unremarkable. Mild scarring of the right kidney upper pole. Probable vascular calcification in the right kidney upper pole. Stomach/Bowel: Unremarkable Vascular/Lymphatic: Aortoiliac atherosclerotic vascular disease. Reproductive: Unremarkable Other: No supplemental non-categorized findings. Musculoskeletal: Mild lumbar spondylosis and degenerative disc disease without overt impingement. IMPRESSION: 1. The colon is relatively small in caliber, no constipation identified. 2. A large left lower lobe lung mass compatible with lung cancer. Small  adjacent loculated left pleural effusion. 3. Right adrenal mass, probably an adenoma, less likely a myelolipoma. 4. Mild intrahepatic and proximal extrahepatic biliary dilatation, likely a physiologic response to cholecystectomy. 5. Right kidney upper pole scarring. 6.  Aortic Atherosclerosis (ICD10-I70.0). Electronically Signed   By: Van Clines M.D.   On: 10/12/2016 20:07   Dg Abdomen Acute W/chest  Result Date: 10/11/2016 CLINICAL DATA:  Constipation. LEFT upper  quadrant pain. Diagnosed with lung cancer 1 month ago. EXAM: DG ABDOMEN ACUTE W/ 1V CHEST COMPARISON:  CT chest Sep 27, 2016 FINDINGS: Cardiomediastinal silhouette is normal. LEFT perihilar lung mass corresponding to known cancer. No pleural effusion. No pneumothorax. Soft tissue planes and included osseous structures are nonacute. RIGHT humeral head suture anchor. Postsurgical changes distal LEFT clavicle. Bowel gas pattern is nondilated and nonobstructive. No significant retained large bowel stool by plain radiography. Surgical clips in the included right abdomen compatible with cholecystectomy. No intra-abdominal mass effect, pathologic calcifications or free air. Soft tissue planes and included osseous structures are non-suspicious. IMPRESSION: LEFT lung mass.  No acute cardiopulmonary process. Nonspecific bowel gas pattern. Electronically Signed   By: Elon Alas M.D.   On: 10/11/2016 23:57    Procedures Procedures (including critical care time)  Medications Ordered in ED Medications  0.9 %  sodium chloride infusion ( Intravenous New Bag/Given 10/12/16 1814)  fentaNYL (SUBLIMAZE) injection 50 mcg (50 mcg Intravenous Given 10/12/16 1814)  ondansetron (ZOFRAN) injection 4 mg (4 mg Intravenous Given 10/12/16 1814)  iopamidol (ISOVUE-300) 61 % injection 100 mL (100 mLs Intravenous Contrast Given 10/12/16 1944)  metoCLOPramide (REGLAN) injection 10 mg (10 mg Intravenous Given 10/12/16 2050)     Initial Impression / Assessment  and Plan / ED Course  I have reviewed the triage vital signs and the nursing notes.  Pertinent labs & imaging results that were available during my care of the patient were reviewed by me and considered in my medical decision making (see chart for details).  Chart review notable for evaluation earlier today, as well as ongoing evaluation with her oncology team, including recent PET scan from last month as below. IMPRESSION: 1. Large centrally necrotic left lower lobe mass, 8.9 cm diameter, maximum SUV 24.7. Although primarily in the right lower lobe I am unsure whether this mass invades across the major fissure. 2. Small suspected neoplastic lymph node along the lateral margin of the descending aortic arch, only 4 mm in short axis but mildly hypermetabolic. 3. Questionable extension of tumor nodularity into the sixth intercostal space, although not definitive for chest wall invasion. 4. Small nodule posteriorly in the left upper lobe 0.6 by 0.3 cm, not visibly hypermetabolic but also below sensitive PET-CT size thresholds. 5. No findings of metastatic disease to the abdomen, pelvis, or skeleton. The mass does not appear to directly invade the adjacent left posterior ribs. 6. Ill-defined accentuated activity in the vicinity of the cervix and upper vagina, maximum SUV 8.7. Although possibly artifactual, consider gynecology referral to exclude the unlikely possibility of cervical malignancy. 7.  Aortic Atherosclerosis (ICD10-I70.0). 8. Mildly asymmetric activity along the right upper posterior glottis without definite CT correlate, probably incidental.     Electronically Signed   By: Van Clines M.D.   On: 09/13/2016 14:37   9:05 PM On repeat exam patient is awake and alert, continues to complain of nausea, in spite of both Zofran and Reglan. She has received fluids. However, she remains intolerant of oral intake.  This elderly female with ongoing chemotherapy for  metastatic lung cancer presents with nausea, vomiting, weakness. Here the patient has persistent nausea, vomiting, in spite of multiple antiemetics. Per the patient's CT scan, labs are generally reassuring, her history of malignancy ongoing chemotherapy, and inability to tolerate oral intake, the patient was admitted for continued rehydration, monitoring, management.   Final Clinical Impressions(s) / ED Diagnoses  Nausea, vomiting   Carmin Muskrat, MD 10/12/16 2109

## 2016-10-12 NOTE — ED Notes (Signed)
Patient requesting something for nausea. EDP made aware.

## 2016-10-12 NOTE — Telephone Encounter (Signed)
"  My husband has called.  I need to know what to do for this nausea and pain.  I was in [place] ED until 3:00 am.  Do I need to go back to the ED?  I had been passing 8 to 9 little balls for bm's but liquid stools started Tuesday.  ED told me I am not constipated.  I feel deathly sick and cannot continue like this.  Nauseated but have not vomited yet.  Started zofran 4 mg ODT Dr. Wolfgang Phoenix ordered last night and taking every 6 hrs.  Have chills but no fever.  My neck and jaw hurt.  I drank Gingerale, ate toast, handful of raisins and that's all today.  Nausea trying the Gwendolyn Lima.  Return number [phone]."

## 2016-10-12 NOTE — Telephone Encounter (Signed)
Please bring her for IVF and Zofran IV tomorrow.

## 2016-10-12 NOTE — ED Provider Notes (Signed)
[facility] DEPT Provider Note   CSN: [id number] Arrival date & time: 10/11/16  2241  Time seen 23:40 PM   History   Chief Complaint Chief Complaint  Patient presents with  . Constipation    HPI Sarah [name] is a 66 y.o. female.  HPI  patient reports she has been on hydrocodone 5 mg tablets for about 6 months and then about 3 weeks ago was increased to 7.5 mg tablets, because of pain in her chest. She states she was recently diagnosed with squamous cell carcinoma of left lung about 3 weeks ago. She had her first dose of chemotherapy 2 days ago without incident, and started radiation yesterday. She reports however she is constipated, she's not had a good bowel movement in 2 weeks. She states she will intermittently passed some small balls. She still denies abdominal pain but states she feels like her abdomen is bloated and swollen. She has nausea without vomiting today. She denies fever. She states she just feels full. She called her oncologist today and has taken MiraLAX 3 times a day, Senokot twice and drinks 64 ounces of water without relief. She took 1 bottle of magnesium citrate 2 days ago without result.   PCP [name], MD Oncology Dr Mckinley Jewel  Past Medical History:  Diagnosis Date  . Arthritis   . Chronic anxiety    Patient describes as stress  . Chronic back pain   . DVT (deep venous thrombosis) ([place]) age 89  . GERD (gastroesophageal reflux disease)   . Goals of care, counseling/discussion 09/21/2016  . Headache(784.0)   . Hypoestrogenism   . Lower extremity venous stasis    LLE, chronic  . Obesity   . Paroxysmal atrial fibrillation (HCC)   . PONV (postoperative nausea and vomiting)   . Pulmonary embolism ([place])   . Reflux   . Sleep apnea    wear CPAP  . Spinal stenosis   . Tobacco abuse   . Venous stasis   . Warfarin anticoagulation     Patient Active Problem List   Diagnosis Date Noted  . Goals of care, counseling/discussion 09/21/2016  .  Encounter for antineoplastic chemotherapy 09/21/2016  . Squamous cell carcinoma of lung, left ([place]) 09/20/2016  . Lung mass 09/15/2016  . Arthritis 06/23/2013  . Atrial fibrillation with RVR ([place]) 08/30/2012  . Obesity, unspecified 08/30/2012  . Hypokalemia 08/30/2012  . Long term current use of anticoagulant therapy 08/03/2012  . OSA (obstructive sleep apnea) 05/23/2012  . History of pulmonary embolism 12/28/2011  . Tobacco abuse 12/28/2011  . Chronic anxiety 12/28/2011  . GERD (gastroesophageal reflux disease) 12/28/2011  . Warfarin anticoagulation 12/28/2011    Past Surgical History:  Procedure Laterality Date  . ANKLE SURGERY     x2  . APPENDECTOMY    . CARPAL TUNNEL RELEASE    . CHOLECYSTECTOMY    . OVARIAN CYST REMOVAL    . ROTATOR CUFF REPAIR    . TUBAL LIGATION    . VIDEO BRONCHOSCOPY WITH ENDOBRONCHIAL ULTRASOUND N/A 09/14/2016   Procedure: VIDEO BRONCHOSCOPY WITH ENDOBRONCHIAL ULTRASOUND;  Surgeon: Melrose Nakayama, MD;  Location: [facility] OR;  Service: Thoracic;  Laterality: N/A;    OB History    No data available       Home Medications    Prior to Admission medications   Medication Sig Start Date End Date Taking? Authorizing Provider  buPROPion (WELLBUTRIN SR) 150 MG 12 hr tablet TAKE 1 TABLET BY MOUTH TWICE DAILY **KEEP APPT ON 06/09/16**  09/01/16   [name], MD  calcium carbonate (TUMS - DOSED IN MG ELEMENTAL CALCIUM) 500 MG chewable tablet Chew 1 tablet by mouth 2 (two) times a week.     [provider]  diltiazem (CARDIZEM CD) 180 MG 24 hr capsule TAKE ONE CAPSULE BY MOUTH EVERY DAY 04/20/16   [name], MD  enoxaparin (LOVENOX) 120 MG/0.8ML injection 115 mg every 12 hours as directed by physician Patient not taking: Reported on 10/02/2016 09/19/16   [name], MD  flecainide (TAMBOCOR) 50 MG tablet TAKE 1 TABLET (50 MG TOTAL) BY MOUTH 2 (TWO) TIMES DAILY. 09/12/16   Satira Sark, MD  HYDROcodone-acetaminophen ([place])  7.5-325 MG tablet One tablet up to four times a day as needed for pain Patient taking differently: Take 1 tablet by mouth 4 (four) times daily as needed for moderate pain.  09/05/16   [name], MD  metoprolol tartrate (LOPRESSOR) 25 MG tablet 1 tablet as needed for heart racing Patient not taking: Reported on 09/21/2016 05/06/15   [name], MD  ondansetron (ZOFRAN ODT) 4 MG disintegrating tablet Take 1 tablet by mouth every 6 hours as needed for nausea. 09/18/16   [name], MD  pantoprazole (PROTONIX) 40 MG tablet TAKE 1 TABLET EVERY MORNING 04/17/16   [name], MD  sucralfate (CARAFATE) 1 g tablet Take 1 tablet (1 g total) by mouth 2 (two) times daily. Patient taking differently: Take 1 g by mouth 2 (two) times daily as needed (acid reflux).  08/01/16   [name], MD  warfarin (COUMADIN) 5 MG tablet TAKE 1 TABLET BY MOUTH EVERY DAY AS DIRECTED Patient taking differently: Take 2.5 mg by mouth daily 06/16/16   [name], MD    Family History Family History  Problem Relation Age of Onset  . Arrhythmia Mother   . Cancer Mother        colon  . Heart disease Mother   . Stroke Father        Deceased    Social History Social History  Substance Use Topics  . Smoking status: Former Smoker    Packs/day: 1.00    Years: 40.00    Types: Cigarettes    Quit date: 08/29/2016  . Smokeless tobacco: Never Used  . Alcohol use No  retired dialysis tech Quit smoking 1 month ago   Allergies   Aspirin   Review of Systems Review of Systems  All other systems reviewed and are negative.    Physical Exam Updated Vital Signs BP 107/68   Pulse (!) 57   Temp 98 F (36.7 C) (Oral)   Resp 20   Ht 5\' 6"  (1.676 m)   Wt 117.9 kg (260 lb)   SpO2 97%   BMI 41.97 kg/m   Vital signs normal except bradycardia   Physical Exam  Constitutional: She is oriented to person, place, and time. She appears well-developed and well-nourished.  Non-toxic appearance.  She does not appear ill. No distress.  Obese   HENT:  Head: Normocephalic and atraumatic.  Right Ear: External ear normal.  Left Ear: External ear normal.  Nose: Nose normal. No mucosal edema or rhinorrhea.  Mouth/Throat: Oropharynx is clear and moist and mucous membranes are normal. No dental abscesses or uvula swelling.  Eyes: Conjunctivae and EOM are normal. Pupils are equal, round, and reactive to light.  Neck: Normal range of motion and full passive range of motion without pain. Neck supple.  Cardiovascular:  Normal rate, regular rhythm and normal heart sounds.  Exam reveals no gallop and no friction rub.   No murmur heard. Pulmonary/Chest: Effort normal and breath sounds normal. No respiratory distress. She has no wheezes. She has no rhonchi. She has no rales. She exhibits no tenderness and no crepitus.  Abdominal: Soft. Normal appearance and bowel sounds are normal. She exhibits no distension. There is no tenderness. There is no rebound and no guarding.  Musculoskeletal: Normal range of motion. She exhibits no edema or tenderness.  Moves all extremities well.   Neurological: She is alert and oriented to person, place, and time. She has normal strength. No cranial nerve deficit.  Skin: Skin is warm, dry and intact. No rash noted. No erythema. No pallor.  Psychiatric: She has a normal mood and affect. Her speech is normal and behavior is normal. Her mood appears not anxious.  Nursing note and vitals reviewed.    ED Treatments / Results  Labs (all labs ordered are listed, but only abnormal results are displayed) Labs Reviewed - No data to display  EKG  EKG Interpretation None       Radiology Dg Abdomen Acute W/chest  Result Date: 10/11/2016 CLINICAL DATA:  Constipation. LEFT upper quadrant pain. Diagnosed with lung cancer 1 month ago. EXAM: DG ABDOMEN ACUTE W/ 1V CHEST COMPARISON:  CT chest Sep 27, 2016 FINDINGS: Cardiomediastinal silhouette is normal. LEFT perihilar lung  mass corresponding to known cancer. No pleural effusion. No pneumothorax. Soft tissue planes and included osseous structures are nonacute. RIGHT humeral head suture anchor. Postsurgical changes distal LEFT clavicle. Bowel gas pattern is nondilated and nonobstructive. No significant retained large bowel stool by plain radiography. Surgical clips in the included right abdomen compatible with cholecystectomy. No intra-abdominal mass effect, pathologic calcifications or free air. Soft tissue planes and included osseous structures are non-suspicious. IMPRESSION: LEFT lung mass.  No acute cardiopulmonary process. Nonspecific bowel gas pattern. Electronically Signed   By: Elon Alas M.D.   On: 10/11/2016 23:57    Procedures Procedures (including critical care time)  Medications Ordered in ED Medications  methylnaltrexone (RELISTOR) injection 12 mg (12 mg Subcutaneous Given 10/12/16 0059)  sodium phosphate (FLEET) 7-19 GM/118ML enema 1 enema (1 enema Rectal Given 10/12/16 0136)     Initial Impression / Assessment and Plan / ED Course  I have reviewed the triage vital signs and the nursing notes.  Pertinent labs & imaging results that were available during my care of the patient were reviewed by me and considered in my medical decision making (see chart for details).  After reviewing her x-ray report, patient was given Relistor [place] for her OIC.  Recheck at 01:20 AM pt felt the need to go briefly, but didn't go, fleets enema ordered.   Pt has had 2 loose stools, she was discharged home with accelerated miralax dosing.      Final Clinical Impressions(s) / ED Diagnoses   Final diagnoses:  Constipation due to opioid therapy    New Prescriptions OTC miralax  Plan discharge  Rolland Porter, MD, Barbette Or, MD 10/12/16 [phone]

## 2016-10-12 NOTE — H&P (Signed)
History and Physical    Sarah [name] ZOX:[id number] DOB: [birth year] DOA: 10/12/2016  PCP: [name], MD   Patient coming from: Home  Chief Complaint: Nausea, abdominal discomfort, malaise, lethargy   HPI: Sarah [name] is a 66 y.o. female with medical history significant for anxiety, osteoarthritis with chronic back pain, history of DVT, paroxysmal atrial fibrillation, and recent diagnosis of squamous cell carcinoma of the left lung, now presenting to the emergency department for evaluation of severe nausea with abdominal discomfort and intolerance of oral intake. Patient had been experiencing some mild nausea and abdominal discomfort for the past couple weeks, and this had been attributed to opiate-induced constipation. She underwent chemotherapy for the first time on 10/09/2016, had radiation the following day, and since that time, her nausea has worsened significantly. She reports inability to tolerate any food or drink secondary to severe worsening in her symptoms. She reports some abdominal discomfort in the left lower quadrant primarily, described as crampy, moderate in intensity, localized, constant, and with no alleviating or exacerbating factors identified. Patient has been taking Zofran ODT at home with no appreciable improvement in her condition. She reports drinking approximately 10 ounces of ginger ale over the past 2 days and has been unable to tolerate anything else. She was seen in the emergency department yesterday for these complaints, it was felt that they may be secondary to opiate-induced constipation, and she was treated with Relistor, had 2 loose stools, and was discharged home with instructions to increase her MiraLAX. Since that time, symptoms have continued to worsen. She denies any fevers or chills and denies vomiting, melena, or hematochezia.  ED Course: Upon arrival to the ED, patient is found to be afebrile, saturating well on room air, and with vitals  otherwise stable. Chemistry panel is largely unremarkable and CBC is within the normal limits. CT of the abdomen and pelvis is negative for constipation or obstruction, but notable for the large left lower lung mass consistent with lung cancer, as well as a small adjacent loculated effusion. Patient was treated with IV fluids, fentanyl, IV Reglan, and IV Zofran in the emergency department. She reports no relief with these measures and has been unable to tolerate anything by mouth in the ED. In her current condition, the patient is at high risk for developing a kidney injury and electrolyte derangements. She will be observed on the medical/surgical unit for ongoing evaluation and management of severe nausea, likely chemotherapy induced, with inability to tolerate any oral intake.  Review of Systems:  All other systems reviewed and apart from HPI, are negative.  Past Medical History:  Diagnosis Date  . Arthritis   . Cancer ([place])    lung cancer  . Chronic anxiety    Patient describes as stress  . Chronic back pain   . DVT (deep venous thrombosis) ([place]) age 57  . GERD (gastroesophageal reflux disease)   . Goals of care, counseling/discussion 09/21/2016  . Headache(784.0)   . Hypoestrogenism   . Lower extremity venous stasis    LLE, chronic  . Obesity   . Paroxysmal atrial fibrillation (HCC)   . PONV (postoperative nausea and vomiting)   . Pulmonary embolism ([place])   . Reflux   . Sleep apnea    wear CPAP  . Spinal stenosis   . Tobacco abuse   . Venous stasis   . Warfarin anticoagulation     Past Surgical History:  Procedure Laterality Date  . ANKLE SURGERY     x2  .  APPENDECTOMY    . CARPAL TUNNEL RELEASE    . CHOLECYSTECTOMY    . OVARIAN CYST REMOVAL    . ROTATOR CUFF REPAIR    . TUBAL LIGATION    . VIDEO BRONCHOSCOPY WITH ENDOBRONCHIAL ULTRASOUND N/A 09/14/2016   Procedure: VIDEO BRONCHOSCOPY WITH ENDOBRONCHIAL ULTRASOUND;  Surgeon: Melrose Nakayama, MD;  Location: [facility];   Service: Thoracic;  Laterality: N/A;     reports that she quit smoking about 6 weeks ago. Her smoking use included Cigarettes. She has a 40.00 pack-year smoking history. She has never used smokeless tobacco. She reports that she does not drink alcohol or use drugs.  Allergies  Allergen Reactions  . Aspirin Nausea And Vomiting    Family History  Problem Relation Age of Onset  . Arrhythmia Mother   . Cancer Mother        colon  . Heart disease Mother   . Stroke Father        Deceased     Prior to Admission medications   Medication Sig Start Date End Date Taking? Authorizing Provider  buPROPion (WELLBUTRIN SR) 150 MG 12 hr tablet TAKE 1 TABLET BY MOUTH TWICE DAILY **KEEP APPT ON 06/09/16** Patient taking differently: Take 150 mg by mouth daily.  09/01/16  Yes [name], MD  calcium carbonate (TUMS - DOSED IN MG ELEMENTAL CALCIUM) 500 MG chewable tablet Chew 1 tablet by mouth 2 (two) times a week.    Yes [provider]  diltiazem (CARDIZEM CD) 180 MG 24 hr capsule TAKE ONE CAPSULE BY MOUTH EVERY DAY 10/12/16  Yes [name], MD  flecainide (TAMBOCOR) 50 MG tablet TAKE 1 TABLET (50 MG TOTAL) BY MOUTH 2 (TWO) TIMES DAILY. 09/12/16  Yes Satira Sark, MD  HYDROcodone-acetaminophen ([place]) 7.5-325 MG tablet One tablet up to four times a day as needed for pain Patient taking differently: Take 1 tablet by mouth 4 (four) times daily as needed for moderate pain.  09/05/16  Yes [name], MD  ondansetron (ZOFRAN ODT) 4 MG disintegrating tablet Take 1 tablet by mouth every 6 hours as needed for nausea. 09/18/16  Yes [name], MD  pantoprazole (PROTONIX) 40 MG tablet TAKE 1 TABLET EVERY MORNING 10/12/16  Yes [name], MD  sucralfate (CARAFATE) 1 g tablet Take 1 tablet (1 g total) by mouth 2 (two) times daily. Patient taking differently: Take 1 g by mouth 2 (two) times daily as needed (acid reflux).  08/01/16  Yes [name], MD  warfarin  (COUMADIN) 5 MG tablet TAKE 1 TABLET BY MOUTH EVERY DAY AS DIRECTED Patient taking differently: Take 2.5 mg by mouth daily except take 5mg  on Wednesdays. Takes in the evening 06/16/16  Yes Luking, Grace Bushy, MD  metoprolol tartrate (LOPRESSOR) 25 MG tablet 1 tablet as needed for heart racing Patient not taking: Reported on 09/21/2016 05/06/15   [name], MD    Physical Exam: Vitals:   10/12/16 1830 10/12/16 1915 10/12/16 1930 10/12/16 2030  BP: (!) 115/48  (!) 104/48 (!) 109/45  Pulse: (!) 57  (!) 58 (!) 56  Resp:      Temp:      TempSrc:      SpO2:  94% 96% 97%  Weight:      Height:          Constitutional: NAD, calm, in obvious discomfort.  Eyes: PERTLA, lids and conjunctivae normal ENMT: Mucous membranes are dry. Posterior pharynx clear of any exudate  or lesions.   Neck: normal, supple, no masses, no thyromegaly Respiratory: clear to auscultation bilaterally, no wheezing, no crackles. Normal respiratory effort.    Cardiovascular: S1 & S2 heard, regular rate and rhythm. Trace pretibial edema bilaterally. No significant JVD. Abdomen: No distension, soft. Mild generalized tenderness, worse in LLQ, but without rebound pain or guarding. Bowel sounds are active.  Musculoskeletal: no clubbing / cyanosis. No joint deformity upper and lower extremities.    Skin: no significant rashes, lesions, ulcers. Warm, dry, well-perfused. Poor turgor.  Neurologic: CN 2-12 grossly intact. Sensation intact, DTR normal. Strength 5/5 in all 4 limbs.  Psychiatric: Alert and oriented x 3. Calm and cooperative.     Labs on Admission: I have personally reviewed following labs and imaging studies  CBC:  Recent Labs Lab 10/09/16 0813 10/12/16 1805  WBC 9.8 8.8  NEUTROABS 7.2* 7.3  HGB 12.8 12.9  HCT 41.0 40.4  MCV 86.5 84.2  PLT 351 462   Basic Metabolic Panel:  Recent Labs Lab 10/09/16 0813 10/12/16 1805  NA 142 137  K 4.0 3.7  CL  --  98*  CO2 28 27  GLUCOSE 105 130*  BUN 9.8  15  CREATININE 1.1 1.02*  CALCIUM 10.4 9.1   GFR: Estimated Creatinine Clearance: 71.8 mL/min (A) (by C-G formula based on SCr of 1.02 mg/dL (H)). Liver Function Tests:  Recent Labs Lab 10/09/16 0813 10/12/16 1805  AST 12 17  ALT 11 20  ALKPHOS 131 110  BILITOT 0.36 0.7  PROT 8.1 7.8  ALBUMIN 3.4* 3.7    Recent Labs Lab 10/12/16 1805  LIPASE 25   No results for input(s): AMMONIA in the last 168 hours. Coagulation Profile:  Recent Labs Lab 10/12/16 1805  INR 3.06   Cardiac Enzymes: No results for input(s): CKTOTAL, CKMB, CKMBINDEX, TROPONINI in the last 168 hours. BNP (last 3 results) No results for input(s): PROBNP in the last 8760 hours. HbA1C: No results for input(s): HGBA1C in the last 72 hours. CBG: No results for input(s): GLUCAP in the last 168 hours. Lipid Profile: No results for input(s): CHOL, HDL, LDLCALC, TRIG, CHOLHDL, LDLDIRECT in the last 72 hours. Thyroid Function Tests: No results for input(s): TSH, T4TOTAL, FREET4, T3FREE, THYROIDAB in the last 72 hours. Anemia Panel: No results for input(s): VITAMINB12, FOLATE, FERRITIN, TIBC, IRON, RETICCTPCT in the last 72 hours. Urine analysis: No results found for: COLORURINE, APPEARANCEUR, LABSPEC, PHURINE, GLUCOSEU, HGBUR, BILIRUBINUR, KETONESUR, PROTEINUR, UROBILINOGEN, NITRITE, LEUKOCYTESUR Sepsis Labs: @LABRCNTIP (procalcitonin:4,lacticidven:4) )No results found for this or any previous visit (from the past 240 hour(s)).   Radiological Exams on Admission: Ct Abdomen Pelvis W Contrast  Result Date: 10/12/2016 CLINICAL DATA:  Nausea, chills, and headaches. Last bowel movement 2 weeks prior to imaging. Lung cancer. EXAM: CT ABDOMEN AND PELVIS WITH CONTRAST TECHNIQUE: Multidetector CT imaging of the abdomen and pelvis was performed using the standard protocol following bolus administration of intravenous contrast. CONTRAST:  164mL ISOVUE-300 IOPAMIDOL (ISOVUE-300) INJECTION 61% COMPARISON:  09/13/2016 and  09/27/2016 cross-sectional imaging exams. FINDINGS: Lower chest: Large left lower lobe lung mass with adjacent passive atelectasis, mass measuring about 7.3 cm in long axis on image 1/2. There is some fatty lobulation of the pleura along the inferior margin, along with a small loculated pleural effusion inferomedial to the mass. Hepatobiliary: Mild intrahepatic biliary dilatation and common hepatic duct dilatation up to 1.4 cm, probably a physiologic response to cholecystectomy. Distally the CBD tapers to a more normal level. Pancreas: Unremarkable Spleen: Unremarkable Adrenals/Urinary Tract: 2.6  by 2.4 cm right adrenal adenoma or myelolipoma, small amount of fatty content on image 31/2. Right kidney upper pole cyst.  Urinary bladder unremarkable. Mild scarring of the right kidney upper pole. Probable vascular calcification in the right kidney upper pole. Stomach/Bowel: Unremarkable Vascular/Lymphatic: Aortoiliac atherosclerotic vascular disease. Reproductive: Unremarkable Other: No supplemental non-categorized findings. Musculoskeletal: Mild lumbar spondylosis and degenerative disc disease without overt impingement. IMPRESSION: 1. The colon is relatively small in caliber, no constipation identified. 2. A large left lower lobe lung mass compatible with lung cancer. Small adjacent loculated left pleural effusion. 3. Right adrenal mass, probably an adenoma, less likely a myelolipoma. 4. Mild intrahepatic and proximal extrahepatic biliary dilatation, likely a physiologic response to cholecystectomy. 5. Right kidney upper pole scarring. 6.  Aortic Atherosclerosis (ICD10-I70.0). Electronically Signed   By: Van Clines M.D.   On: 10/12/2016 20:07   Dg Abdomen Acute W/chest  Result Date: 10/11/2016 CLINICAL DATA:  Constipation. LEFT upper quadrant pain. Diagnosed with lung cancer 1 month ago. EXAM: DG ABDOMEN ACUTE W/ 1V CHEST COMPARISON:  CT chest Sep 27, 2016 FINDINGS: Cardiomediastinal silhouette is normal.  LEFT perihilar lung mass corresponding to known cancer. No pleural effusion. No pneumothorax. Soft tissue planes and included osseous structures are nonacute. RIGHT humeral head suture anchor. Postsurgical changes distal LEFT clavicle. Bowel gas pattern is nondilated and nonobstructive. No significant retained large bowel stool by plain radiography. Surgical clips in the included right abdomen compatible with cholecystectomy. No intra-abdominal mass effect, pathologic calcifications or free air. Soft tissue planes and included osseous structures are non-suspicious. IMPRESSION: LEFT lung mass.  No acute cardiopulmonary process. Nonspecific bowel gas pattern. Electronically Signed   By: Elon Alas M.D.   On: 10/11/2016 23:57    EKG: Not performed.    Assessment/Plan  1. Nausea, with intolerance of oral intake  - Pt presents with worsening nausea and abdominal discomfort, worse with any attempt at oral intake  - She has been unable to tolerate any food or drink in the last 2 days secondary to severe nausea despite using Zofran ODT and compazine at home  - Suspect this is secondary to chemotherapy  - CT abd/pelvis is reassuring with no evidence for obstructive or infectious process, no constipation - No significant electrolyte derangement  - She was given IVF and IV antiemetic in ED, but still unable to tolerate oral intake d/t severity of her symptoms  - Plan to continue IVF hydration, IV antiemetic, prn analgesia, start clear liquid diet as sxs allow, and advance diet as tolerated   2. Lung cancer, left  - Bronchoscopy in May 2018 with biopsies consistent with SCC, not felt to be candidate for resection - She is under the care of oncology and radiation oncology; had first chemo infusion on 10/09/16 and first radiation the following day   3. Atrial fibrillation - In a regular rhythm on admission - CHADS-VASc at least 2 (age, gender); also has hx of PE  - Continue current management with  warfarin, diltiazem, and flecainide   4. OSA  - CPAP qHS     DVT prophylaxis: warfarin  Code Status: Full  Family Communication: Husband updated at bedside Disposition Plan: Observe on med-surg Consults called: None Admission status: Observation    Vianne Bulls, MD [facility] Pager [phone]  If 7PM-7AM, please contact night-coverage [email or web address] Password [facility]  10/12/2016, 9:32 PM

## 2016-10-12 NOTE — Discharge Instructions (Signed)
Get miralax and put one dose or 17 g in 8 ounces of water,  take 1 dose every 30 minutes for 2-3 hours or until you  get good results and then once or twice daily to prevent constipation.  Please discuss with your primary care doctor or your cancer doctor if not resolving in the next 24 hours.   Return to the ED if you get a fever, nausea or vomiting, or abdominal pain.

## 2016-10-12 NOTE — ED Triage Notes (Addendum)
Pt reports seen for same last night. Pt reports continued nausea chills and headache. Pt reports took zofran PTA. Pt reports last BM x2 weeks ago. Pt reports started chemo x1 month ago for lung cancer.

## 2016-10-12 NOTE — Telephone Encounter (Signed)
Husband of patient called stating that she went to the ED yesterday due to constipation, but now she is having multiple issues possibly related to the chemotherapy (nausea/dizziness). Patient currently has zofran ODT by another physician which she last took last night. Instructed patient to take now and continue to drink water. Zofran is the only anti-emetic on file. Would MD Mohamed like to prescribe another anti-emetic to have on board. Please advise and give patient a call back.

## 2016-10-13 ENCOUNTER — Ambulatory Visit: Payer: Medicare Other

## 2016-10-13 DIAGNOSIS — R112 Nausea with vomiting, unspecified: Secondary | ICD-10-CM | POA: Diagnosis not present

## 2016-10-13 DIAGNOSIS — I480 Paroxysmal atrial fibrillation: Secondary | ICD-10-CM | POA: Diagnosis present

## 2016-10-13 DIAGNOSIS — T40605A Adverse effect of unspecified narcotics, initial encounter: Secondary | ICD-10-CM | POA: Diagnosis present

## 2016-10-13 DIAGNOSIS — Z79899 Other long term (current) drug therapy: Secondary | ICD-10-CM | POA: Diagnosis not present

## 2016-10-13 DIAGNOSIS — C3432 Malignant neoplasm of lower lobe, left bronchus or lung: Secondary | ICD-10-CM | POA: Diagnosis present

## 2016-10-13 DIAGNOSIS — K5903 Drug induced constipation: Secondary | ICD-10-CM | POA: Diagnosis present

## 2016-10-13 DIAGNOSIS — K219 Gastro-esophageal reflux disease without esophagitis: Secondary | ICD-10-CM | POA: Diagnosis present

## 2016-10-13 DIAGNOSIS — C3490 Malignant neoplasm of unspecified part of unspecified bronchus or lung: Secondary | ICD-10-CM | POA: Diagnosis not present

## 2016-10-13 DIAGNOSIS — Z7901 Long term (current) use of anticoagulants: Secondary | ICD-10-CM | POA: Diagnosis not present

## 2016-10-13 DIAGNOSIS — R1114 Bilious vomiting: Secondary | ICD-10-CM | POA: Diagnosis not present

## 2016-10-13 DIAGNOSIS — I482 Chronic atrial fibrillation: Secondary | ICD-10-CM | POA: Diagnosis present

## 2016-10-13 DIAGNOSIS — G4733 Obstructive sleep apnea (adult) (pediatric): Secondary | ICD-10-CM | POA: Diagnosis present

## 2016-10-13 DIAGNOSIS — R110 Nausea: Secondary | ICD-10-CM | POA: Diagnosis not present

## 2016-10-13 DIAGNOSIS — Z886 Allergy status to analgesic agent status: Secondary | ICD-10-CM | POA: Diagnosis not present

## 2016-10-13 DIAGNOSIS — T451X5A Adverse effect of antineoplastic and immunosuppressive drugs, initial encounter: Secondary | ICD-10-CM

## 2016-10-13 DIAGNOSIS — Z87891 Personal history of nicotine dependence: Secondary | ICD-10-CM | POA: Diagnosis not present

## 2016-10-13 LAB — CBC
HCT: 38.9 % (ref 36.0–46.0)
Hemoglobin: 12.3 g/dL (ref 12.0–15.0)
MCH: 26.5 pg (ref 26.0–34.0)
MCHC: 31.6 g/dL (ref 30.0–36.0)
MCV: 83.7 fL (ref 78.0–100.0)
PLATELETS: 302 10*3/uL (ref 150–400)
RBC: 4.65 MIL/uL (ref 3.87–5.11)
RDW: 15.8 % — AB (ref 11.5–15.5)
WBC: 7.2 10*3/uL (ref 4.0–10.5)

## 2016-10-13 LAB — BASIC METABOLIC PANEL
Anion gap: 9 (ref 5–15)
BUN: 12 mg/dL (ref 6–20)
CALCIUM: 8.7 mg/dL — AB (ref 8.9–10.3)
CO2: 28 mmol/L (ref 22–32)
CREATININE: 0.9 mg/dL (ref 0.44–1.00)
Chloride: 101 mmol/L (ref 101–111)
GFR calc Af Amer: >60 mL/min (ref >60)
GFR calc non Af Amer: >60 mL/min (ref >60)
Glucose, Bld: 106 mg/dL — ABNORMAL HIGH (ref 65–99)
Potassium: 3.8 mmol/L (ref 3.5–5.1)
SODIUM: 138 mmol/L (ref 135–145)

## 2016-10-13 LAB — PROTIME-INR
INR: 3.28
PROTHROMBIN TIME: 34.2 s — AB (ref 11.4–15.2)

## 2016-10-13 MED ORDER — ENSURE ENLIVE PO LIQD: 237.0000 mL | Freq: Two times a day (BID) | ORAL | Status: DC

## 2016-10-13 MED ORDER — SODIUM CHLORIDE 0.9 % IV SOLN
INTRAVENOUS | Status: DC
  Administered 2016-10-13 – 2016-10-14 (×3): via INTRAVENOUS

## 2016-10-13 NOTE — Progress Notes (Signed)
PROGRESS NOTE    SHAMRA BRADEEN  HMC:[id number] DOB: Jan 23, 1951 DOA: 10/12/2016 PCP: [name], MD     Brief Narrative:  66 y/o woman admitted from home on 6/14 due to n/v that is presumed to be chemotherapy-induced. She received her first chemo on 6/11 for a newly diagnosed lung cancer. She is a little improved today, altho she is very hesitant to try anything by mouth.   Assessment & Plan:   Principal Problem:   Chemotherapy induced nausea and vomiting Active Problems:   OSA (obstructive sleep apnea)   Atrial fibrillation, chronic (HCC)   Squamous cell carcinoma of lung, left (HCC)   Chemotherapy-induced nausea   N/V -Improved, but still not much POs. -Advance diet; anticipate DC home over next 24 hours.  Lung Cancer -F/u OP with oncology as scheduled. -Newly diagnosed SCC of the left lung.   DVT prophylaxis: warfarin Code Status: full code Family Communication: husband and friend at bedside Disposition Plan: anticipate DC home in 24 hours  Consultants:   None  Procedures:   None  Antimicrobials:  Anti-infectives    None       Subjective: Feels improved. Afraid to take anything by mouth as she does not want the n/v to flare up. "If this is gonna happen with all my chemo sessions I don't think I will be able to handle it".  Objective: Vitals:   10/12/16 2030 10/12/16 2220 10/13/16 0500 10/13/16 1417  BP: (!) 109/45 (!) 108/44 (!) 122/48 (!) 124/53  Pulse: (!) 56 (!) 56 68 63  Resp:  18 18 18   Temp:  98.8 F (37.1 C) 98.4 F (36.9 C) 98.8 F (37.1 C)  TempSrc:  Oral Oral Oral  SpO2: 97% 94% 93% 96%  Weight:      Height:        Intake/Output Summary (Last 24 hours) at 10/13/16 1636 Last data filed at 10/13/16 1500  Gross per 24 hour  Intake           283.75 ml  Output                0 ml  Net           283.75 ml   Filed Weights   10/12/16 1735  Weight: 117.9 kg (260 lb)    Examination:  General exam: Alert, awake, oriented  x 3, morbidly obese Respiratory system: Clear to auscultation. Respiratory effort normal. Cardiovascular system:RRR. No murmurs, rubs, gallops. Gastrointestinal system: Abdomen is nondistended, soft and nontender. No organomegaly or masses felt. Normal bowel sounds heard. Central nervous system: Alert and oriented. No focal neurological deficits. Extremities: No C/C/E, +pedal pulses Skin: No rashes, lesions or ulcers Psychiatry: Judgement and insight appear normal. Mood & affect appropriate.     Data Reviewed: I have personally reviewed following labs and imaging studies  CBC:  Recent Labs Lab 10/09/16 0813 10/12/16 1805 10/13/16 0557  WBC 9.8 8.8 7.2  NEUTROABS 7.2* 7.3  --   HGB 12.8 12.9 12.3  HCT 41.0 40.4 38.9  MCV 86.5 84.2 83.7  PLT 351 325 662   Basic Metabolic Panel:  Recent Labs Lab 10/09/16 0813 10/12/16 1805 10/13/16 0557  NA 142 137 138  K 4.0 3.7 3.8  CL  --  98* 101  CO2 28 27 28   GLUCOSE 105 130* 106*  BUN 9.8 15 12   CREATININE 1.1 1.02* 0.90  CALCIUM 10.4 9.1 8.7*   GFR: Estimated Creatinine Clearance: 81.4 mL/min (by C-G formula based  on SCr of 0.9 mg/dL). Liver Function Tests:  Recent Labs Lab 10/09/16 0813 10/12/16 1805  AST 12 17  ALT 11 20  ALKPHOS 131 110  BILITOT 0.36 0.7  PROT 8.1 7.8  ALBUMIN 3.4* 3.7    Recent Labs Lab 10/12/16 1805  LIPASE 25   No results for input(s): AMMONIA in the last 168 hours. Coagulation Profile:  Recent Labs Lab 10/12/16 1805 10/13/16 0557  INR 3.06 3.28   Cardiac Enzymes: No results for input(s): CKTOTAL, CKMB, CKMBINDEX, TROPONINI in the last 168 hours. BNP (last 3 results) No results for input(s): PROBNP in the last 8760 hours. HbA1C: No results for input(s): HGBA1C in the last 72 hours. CBG: No results for input(s): GLUCAP in the last 168 hours. Lipid Profile: No results for input(s): CHOL, HDL, LDLCALC, TRIG, CHOLHDL, LDLDIRECT in the last 72 hours. Thyroid Function Tests: No  results for input(s): TSH, T4TOTAL, FREET4, T3FREE, THYROIDAB in the last 72 hours. Anemia Panel: No results for input(s): VITAMINB12, FOLATE, FERRITIN, TIBC, IRON, RETICCTPCT in the last 72 hours. Urine analysis:    Component Value Date/Time   COLORURINE YELLOW 10/12/2016 2140   APPEARANCEUR CLEAR 10/12/2016 2140   LABSPEC >1.046 (H) 10/12/2016 2140   PHURINE 6.0 10/12/2016 2140   GLUCOSEU NEGATIVE 10/12/2016 2140   HGBUR NEGATIVE 10/12/2016 2140   Okay NEGATIVE 10/12/2016 2140   Vivian NEGATIVE 10/12/2016 2140   PROTEINUR NEGATIVE 10/12/2016 2140   NITRITE NEGATIVE 10/12/2016 2140   LEUKOCYTESUR TRACE (A) 10/12/2016 2140   Sepsis Labs: @LABRCNTIP (procalcitonin:4,lacticidven:4)  )No results found for this or any previous visit (from the past 240 hour(s)).       Radiology Studies: Ct Abdomen Pelvis W Contrast  Result Date: 10/12/2016 CLINICAL DATA:  Nausea, chills, and headaches. Last bowel movement 2 weeks prior to imaging. Lung cancer. EXAM: CT ABDOMEN AND PELVIS WITH CONTRAST TECHNIQUE: Multidetector CT imaging of the abdomen and pelvis was performed using the standard protocol following bolus administration of intravenous contrast. CONTRAST:  145mL ISOVUE-300 IOPAMIDOL (ISOVUE-300) INJECTION 61% COMPARISON:  09/13/2016 and 09/27/2016 cross-sectional imaging exams. FINDINGS: Lower chest: Large left lower lobe lung mass with adjacent passive atelectasis, mass measuring about 7.3 cm in long axis on image 1/2. There is some fatty lobulation of the pleura along the inferior margin, along with a small loculated pleural effusion inferomedial to the mass. Hepatobiliary: Mild intrahepatic biliary dilatation and common hepatic duct dilatation up to 1.4 cm, probably a physiologic response to cholecystectomy. Distally the CBD tapers to a more normal level. Pancreas: Unremarkable Spleen: Unremarkable Adrenals/Urinary Tract: 2.6 by 2.4 cm right adrenal adenoma or myelolipoma, small  amount of fatty content on image 31/2. Right kidney upper pole cyst.  Urinary bladder unremarkable. Mild scarring of the right kidney upper pole. Probable vascular calcification in the right kidney upper pole. Stomach/Bowel: Unremarkable Vascular/Lymphatic: Aortoiliac atherosclerotic vascular disease. Reproductive: Unremarkable Other: No supplemental non-categorized findings. Musculoskeletal: Mild lumbar spondylosis and degenerative disc disease without overt impingement. IMPRESSION: 1. The colon is relatively small in caliber, no constipation identified. 2. A large left lower lobe lung mass compatible with lung cancer. Small adjacent loculated left pleural effusion. 3. Right adrenal mass, probably an adenoma, less likely a myelolipoma. 4. Mild intrahepatic and proximal extrahepatic biliary dilatation, likely a physiologic response to cholecystectomy. 5. Right kidney upper pole scarring. 6.  Aortic Atherosclerosis (ICD10-I70.0). Electronically Signed   By: Van Clines M.D.   On: 10/12/2016 20:07   Dg Abdomen Acute W/chest  Result Date: 10/11/2016 CLINICAL DATA:  Constipation. LEFT upper quadrant pain. Diagnosed with lung cancer 1 month ago. EXAM: DG ABDOMEN ACUTE W/ 1V CHEST COMPARISON:  CT chest Sep 27, 2016 FINDINGS: Cardiomediastinal silhouette is normal. LEFT perihilar lung mass corresponding to known cancer. No pleural effusion. No pneumothorax. Soft tissue planes and included osseous structures are nonacute. RIGHT humeral head suture anchor. Postsurgical changes distal LEFT clavicle. Bowel gas pattern is nondilated and nonobstructive. No significant retained large bowel stool by plain radiography. Surgical clips in the included right abdomen compatible with cholecystectomy. No intra-abdominal mass effect, pathologic calcifications or free air. Soft tissue planes and included osseous structures are non-suspicious. IMPRESSION: LEFT lung mass.  No acute cardiopulmonary process. Nonspecific bowel gas  pattern. Electronically Signed   By: Elon Alas M.D.   On: 10/11/2016 23:57        Scheduled Meds: . buPROPion  150 mg Oral Daily  . diltiazem  180 mg Oral Daily  . feeding supplement (ENSURE ENLIVE)  237 mL Oral BID BM  . flecainide  50 mg Oral Q12H  . pantoprazole  40 mg Oral q morning - 10a   Continuous Infusions: . sodium chloride 75 mL/hr at 10/13/16 1142     LOS: 0 days    Time spent: 25 minutes. Greater than 50% of this time was spent in direct contact with the patient coordinating care.     Lelon Frohlich, MD [facility] Pager [phone]  If 7PM-7AM, please contact night-coverage [email or web address] Password [facility] 10/13/2016, 4:36 PM

## 2016-10-13 NOTE — Care Management Obs Status (Signed)
[place] NOTIFICATION   Patient Details  Name: Sarah [name] MRN: [id number] Date of Birth: 05-30-1950   Medicare Observation Status Notification Given:  Yes    Dwyne Hasegawa, Chauncey Reading, RN 10/13/2016, 10:23 AM

## 2016-10-13 NOTE — Care Management Note (Signed)
Case Management Note  Patient Details  Name: Sarah [name] MRN: [id number] Date of Birth: 1950/06/02  Subjective/Objective:    Adm with N/V, unable to tolerate any fluids. Patient goes to [facility] for chemotherapy for squamous cell carcinoma of left lung.   She is ind PTA. No HH or DME. She has PCP, still drives to all her appointments. She has Medicare, reports no issues affording medications.            Action/Plan: Anticipate DC home with self care.   Expected Discharge Date:    10/14/2016              Expected Discharge Plan:  Home/Self Care  In-House Referral:     Discharge planning Services  CM Consult  Post Acute Care Choice:    Choice offered to:     DME Arranged:    DME Agency:     HH Arranged:    HH Agency:     Status of Service:  Completed, signed off  If discussed at [employer] of Stay Meetings, dates discussed:    Additional Comments:  Carmelina Balducci, Chauncey Reading, RN 10/13/2016, 12:59 PM

## 2016-10-13 NOTE — Progress Notes (Signed)
Initial Nutrition Assessment  DOCUMENTATION CODES:   Not applicable  INTERVENTION:  D/c Boost Breeze   Ensure Enlive po BID, each supplement provides 350 kcal and 20 grams of protein    NUTRITION DIAGNOSIS:   Inadequate oral intake related to cancer and cancer related treatments, nausea, vomiting as evidenced by per patient/family report fluids past 3 days   GOAL:   Patient will meet greater than or equal to 90% of their needs    MONITOR:   PO intake, Labs, Weight trends, Diet advancement  REASON FOR ASSESSMENT:   Malnutrition Screening Tool    ASSESSMENT:   Sarah [name] is a 66 yo female who presents with chemotherapy induced nausea and vomiting. She started chemo and radiation this week and has been unable to tolerate solid foods since Tuesday. She has een consumeing primarily gingerale and water. Her usual diet is Regular and she avoids caffine due to atrial fibrillation. Her weight is currently within usual range however she is high risk for malnutrition given her poor tolerance of nutrition intake and catabolic illness. Nutrition focused exam: unremarkable.  Recent Labs Lab 10/09/16 0813 10/12/16 1805 10/13/16 0557  NA 142 137 138  K 4.0 3.7 3.8  CL  --  98* 101  CO2 28 27 28   BUN 9.8 15 12   CREATININE 1.1 1.02* 0.90  CALCIUM 10.4 9.1 8.7*  GLUCOSE 105 130* 106*   Labs: reviewed  Meds: PPI, Mag. citrate  Diet Order:  Diet full liquid Room service appropriate? Yes; Fluid consistency: Thin  Skin:  Reviewed, no issues  Last BM:  6/15 liquid stool  Height:   Ht Readings from Last 1 Encounters:  10/12/16 5\' 6"  (1.676 m)    Weight:   Wt Readings from Last 1 Encounters:  10/12/16 260 lb (117.9 kg)    Ideal Body Weight:  59 kg  BMI:  Body mass index is 41.97 kg/m.  Estimated Nutritional Needs:   Kcal:  2097   Protein:  110-118 gr  Fluid:  2.1 liters daily  EDUCATION NEEDS:   No education needs identified at this time  Colman Cater  Sarah,RD,CSG,LDN Office: #[phone] Pager: [phone]

## 2016-10-13 NOTE — Progress Notes (Signed)
ANTICOAGULATION CONSULT NOTE - Initial Consult  Pharmacy Consult for Coumadin (home med) Indication: atrial fibrillation  Allergies  Allergen Reactions  . Aspirin Nausea And Vomiting   Patient Measurements: Height: 5\' 6"  (167.6 cm) Weight: 260 lb (117.9 kg) IBW/kg (Calculated) : 59.3  Vital Signs: Temp: 98.4 F (36.9 C) (06/15 0500) Temp Source: Oral (06/15 0500) BP: 122/48 (06/15 0500) Pulse Rate: 68 (06/15 0500)  Labs:  Recent Labs  10/12/16 1805 10/13/16 0557  HGB 12.9 12.3  HCT 40.4 38.9  PLT 325 302  LABPROT 32.3* 34.2*  INR 3.06 3.28  CREATININE 1.02* 0.90   Estimated Creatinine Clearance: 81.4 mL/min (by C-G formula based on SCr of 0.9 mg/dL).  Medical History: Past Medical History:  Diagnosis Date  . Arthritis   . Cancer ([place])    lung cancer  . Chronic anxiety    Patient describes as stress  . Chronic back pain   . DVT (deep venous thrombosis) ([place]) age 67  . GERD (gastroesophageal reflux disease)   . Goals of care, counseling/discussion 09/21/2016  . Headache(784.0)   . Hypoestrogenism   . Lower extremity venous stasis    LLE, chronic  . Obesity   . Paroxysmal atrial fibrillation (HCC)   . PONV (postoperative nausea and vomiting)   . Pulmonary embolism ([place])   . Reflux   . Sleep apnea    wear CPAP  . Spinal stenosis   . Tobacco abuse   . Venous stasis   . Warfarin anticoagulation    Medications:  Prescriptions Prior to Admission  Medication Sig Dispense Refill Last Dose  . buPROPion (WELLBUTRIN SR) 150 MG 12 hr tablet TAKE 1 TABLET BY MOUTH TWICE DAILY **KEEP APPT ON 06/09/16** (Patient taking differently: Take 150 mg by mouth daily. ) 180 tablet 3 10/11/2016 at Unknown time  . calcium carbonate (TUMS - DOSED IN MG ELEMENTAL CALCIUM) 500 MG chewable tablet Chew 1 tablet by mouth 2 (two) times a week.    Past Week at Unknown time  . diltiazem (CARDIZEM CD) 180 MG 24 hr capsule TAKE ONE CAPSULE BY MOUTH EVERY DAY 90 capsule 1 10/12/2016 at  Unknown time  . flecainide (TAMBOCOR) 50 MG tablet TAKE 1 TABLET (50 MG TOTAL) BY MOUTH 2 (TWO) TIMES DAILY. 60 tablet 6 10/12/2016 at Unknown time  . HYDROcodone-acetaminophen (NORCO) 7.5-325 MG tablet One tablet up to four times a day as needed for pain (Patient taking differently: Take 1 tablet by mouth 4 (four) times daily as needed for moderate pain. ) 120 tablet 0 10/12/2016 at Unknown time  . ondansetron (ZOFRAN ODT) 4 MG disintegrating tablet Take 1 tablet by mouth every 6 hours as needed for nausea. 21 tablet 0 10/12/2016 at Unknown time  . pantoprazole (PROTONIX) 40 MG tablet TAKE 1 TABLET EVERY MORNING 90 tablet 1 10/12/2016 at Unknown time  . sucralfate (CARAFATE) 1 g tablet Take 1 tablet (1 g total) by mouth 2 (two) times daily. (Patient taking differently: Take 1 g by mouth 2 (two) times daily as needed (acid reflux). ) 60 tablet 5 Past Month at Unknown time  . warfarin (COUMADIN) 5 MG tablet TAKE 1 TABLET BY MOUTH EVERY DAY AS DIRECTED (Patient taking differently: Take 2.5 mg by mouth daily except take 5mg  on Wednesdays. Takes in the evening) 90 tablet 1 10/12/2016 at 300a  . metoprolol tartrate (LOPRESSOR) 25 MG tablet 1 tablet as needed for heart racing (Patient not taking: Reported on 09/21/2016) 10 tablet 5 Not Taking at Unknown time  Assessment: 66yo female with h/o afib, DVT, PE on chronic Coumadin.  INR elevated > 3.  Home dose listed above.  No bleeding noted.  H/H OK.    Goal of Therapy:  INR 2-3 Monitor platelets by anticoagulation protocol: Yes   Plan:  HOLD coumadin today, allow INR to trend down Monitor labs, H/H, s/sx of bleeding complications  [place] Crane, Monserrat Vidaurri A 10/13/2016,10:39 AM

## 2016-10-13 NOTE — Telephone Encounter (Signed)
Pt admitted to [facility] 6/14

## 2016-10-14 LAB — CBC
HCT: 38.4 % (ref 36.0–46.0)
HEMOGLOBIN: 12 g/dL (ref 12.0–15.0)
MCH: 26.4 pg (ref 26.0–34.0)
MCHC: 31.3 g/dL (ref 30.0–36.0)
MCV: 84.6 fL (ref 78.0–100.0)
PLATELETS: 279 10*3/uL (ref 150–400)
RBC: 4.54 MIL/uL (ref 3.87–5.11)
RDW: 15.9 % — ABNORMAL HIGH (ref 11.5–15.5)
WBC: 6.5 10*3/uL (ref 4.0–10.5)

## 2016-10-14 LAB — PROTIME-INR
INR: 2.67
Prothrombin Time: 29 seconds — ABNORMAL HIGH (ref 11.4–15.2)

## 2016-10-14 MED ORDER — PROMETHAZINE HCL 12.5 MG PO TABS: 12.5000 mg | ORAL_TABLET | Freq: Four times a day (QID) | ORAL | 0 refills | Status: DC | PRN

## 2016-10-14 MED ORDER — WARFARIN - PHARMACIST DOSING INPATIENT: Freq: Every day | Status: DC

## 2016-10-14 MED ORDER — WARFARIN SODIUM 2.5 MG PO TABS: 2.5000 mg | ORAL_TABLET | Freq: Once | ORAL | Status: DC

## 2016-10-14 NOTE — Progress Notes (Signed)
Patient IV removed, tolerated well. Discharge instructions given to patient and family at bedside.

## 2016-10-14 NOTE — Progress Notes (Signed)
ANTICOAGULATION CONSULT NOTE - Initial Consult  Pharmacy Consult for Coumadin (home med) Indication: atrial fibrillation  Allergies  Allergen Reactions  . Aspirin Nausea And Vomiting   Patient Measurements: Height: 5\' 6"  (167.6 cm) Weight: 260 lb (117.9 kg) IBW/kg (Calculated) : 59.3  Vital Signs: Temp: 98.8 F (37.1 C) (06/16 0645) Temp Source: Oral (06/16 0645) BP: 109/42 (06/16 0645) Pulse Rate: 63 (06/16 0645)  Labs:  Recent Labs  10/12/16 1805 10/13/16 0557 10/14/16 0504  HGB 12.9 12.3 12.0  HCT 40.4 38.9 38.4  PLT 325 302 279  LABPROT 32.3* 34.2* 29.0*  INR 3.06 3.28 2.67  CREATININE 1.02* 0.90  --    Estimated Creatinine Clearance: 81.4 mL/min (by C-G formula based on SCr of 0.9 mg/dL).  Medical History: Past Medical History:  Diagnosis Date  . Arthritis   . Cancer ([place])    lung cancer  . Chronic anxiety    Patient describes as stress  . Chronic back pain   . DVT (deep venous thrombosis) ([place]) age 89  . GERD (gastroesophageal reflux disease)   . Goals of care, counseling/discussion 09/21/2016  . Headache(784.0)   . Hypoestrogenism   . Lower extremity venous stasis    LLE, chronic  . Obesity   . Paroxysmal atrial fibrillation (HCC)   . PONV (postoperative nausea and vomiting)   . Pulmonary embolism ([place])   . Reflux   . Sleep apnea    wear CPAP  . Spinal stenosis   . Tobacco abuse   . Venous stasis   . Warfarin anticoagulation    Medications:  Prescriptions Prior to Admission  Medication Sig Dispense Refill Last Dose  . buPROPion (WELLBUTRIN SR) 150 MG 12 hr tablet TAKE 1 TABLET BY MOUTH TWICE DAILY **KEEP APPT ON 06/09/16** (Patient taking differently: Take 150 mg by mouth daily. ) 180 tablet 3 10/11/2016 at Unknown time  . calcium carbonate (TUMS - DOSED IN MG ELEMENTAL CALCIUM) 500 MG chewable tablet Chew 1 tablet by mouth 2 (two) times a week.    Past Week at Unknown time  . diltiazem (CARDIZEM CD) 180 MG 24 hr capsule TAKE ONE CAPSULE BY  MOUTH EVERY DAY 90 capsule 1 10/12/2016 at Unknown time  . flecainide (TAMBOCOR) 50 MG tablet TAKE 1 TABLET (50 MG TOTAL) BY MOUTH 2 (TWO) TIMES DAILY. 60 tablet 6 10/12/2016 at Unknown time  . HYDROcodone-acetaminophen (NORCO) 7.5-325 MG tablet One tablet up to four times a day as needed for pain (Patient taking differently: Take 1 tablet by mouth 4 (four) times daily as needed for moderate pain. ) 120 tablet 0 10/12/2016 at Unknown time  . ondansetron (ZOFRAN ODT) 4 MG disintegrating tablet Take 1 tablet by mouth every 6 hours as needed for nausea. 21 tablet 0 10/12/2016 at Unknown time  . pantoprazole (PROTONIX) 40 MG tablet TAKE 1 TABLET EVERY MORNING 90 tablet 1 10/12/2016 at Unknown time  . sucralfate (CARAFATE) 1 g tablet Take 1 tablet (1 g total) by mouth 2 (two) times daily. (Patient taking differently: Take 1 g by mouth 2 (two) times daily as needed (acid reflux). ) 60 tablet 5 Past Month at Unknown time  . warfarin (COUMADIN) 5 MG tablet TAKE 1 TABLET BY MOUTH EVERY DAY AS DIRECTED (Patient taking differently: Take 2.5 mg by mouth daily except take 5mg  on Wednesdays. Takes in the evening) 90 tablet 1 10/12/2016 at 300a  . metoprolol tartrate (LOPRESSOR) 25 MG tablet 1 tablet as needed for heart racing (Patient not taking: Reported on  09/21/2016) 10 tablet 5 Not Taking at Unknown time   Assessment: 66yo female with h/o afib, DVT, PE on chronic Coumadin.  INR therapeutic today. Home dose listed above.  No bleeding noted.  H/H OK.    Goal of Therapy:  INR 2-3 Monitor platelets by anticoagulation protocol: Yes   Plan:  Coumadin 2.5mg  today x 1 Monitor labs, H/H, s/sx of bleeding complications  Hart Robinsons A 10/14/2016,8:25 AM

## 2016-10-14 NOTE — Discharge Summary (Signed)
Physician Discharge Summary  Sarah [name] YDX:[id number] DOB: 1950-05-21 DOA: 10/12/2016  PCP: [name], MD  Admit date: 10/12/2016 Discharge date: 10/14/2016  Time spent: 45 minutes  Recommendations for Outpatient Follow-up:  -Will be discharged home today. -Advised to follow up with oncology as scheduled.   Discharge Diagnoses:  Principal Problem:   Chemotherapy induced nausea and vomiting Active Problems:   OSA (obstructive sleep apnea)   Atrial fibrillation, chronic (HCC)   Squamous cell carcinoma of lung, left (HCC)   Chemotherapy-induced nausea   Discharge Condition: Stable and improved  Filed Weights   10/12/16 1735  Weight: 117.9 kg (260 lb)    History of present illness:  As per Dr. Myna Hidalgo on 6/14: Sarah [name] is a 66 y.o. female with medical history significant for anxiety, osteoarthritis with chronic back pain, history of DVT, paroxysmal atrial fibrillation, and recent diagnosis of squamous cell carcinoma of the left lung, now presenting to the emergency department for evaluation of severe nausea with abdominal discomfort and intolerance of oral intake. Patient had been experiencing some mild nausea and abdominal discomfort for the past couple weeks, and this had been attributed to opiate-induced constipation. She underwent chemotherapy for the first time on 10/09/2016, had radiation the following day, and since that time, her nausea has worsened significantly. She reports inability to tolerate any food or drink secondary to severe worsening in her symptoms. She reports some abdominal discomfort in the left lower quadrant primarily, described as crampy, moderate in intensity, localized, constant, and with no alleviating or exacerbating factors identified. Patient has been taking Zofran ODT at home with no appreciable improvement in her condition. She reports drinking approximately 10 ounces of ginger ale over the past 2 days and has been unable to tolerate  anything else. She was seen in the emergency department yesterday for these complaints, it was felt that they may be secondary to opiate-induced constipation, and she was treated with Relistor, had 2 loose stools, and was discharged home with instructions to increase her MiraLAX. Since that time, symptoms have continued to worsen. She denies any fevers or chills and denies vomiting, melena, or hematochezia.  ED Course: Upon arrival to the ED, patient is found to be afebrile, saturating well on room air, and with vitals otherwise stable. Chemistry panel is largely unremarkable and CBC is within the normal limits. CT of the abdomen and pelvis is negative for constipation or obstruction, but notable for the large left lower lung mass consistent with lung cancer, as well as a small adjacent loculated effusion. Patient was treated with IV fluids, fentanyl, IV Reglan, and IV Zofran in the emergency department. She reports no relief with these measures and has been unable to tolerate anything by mouth in the ED. In her current condition, the patient is at high risk for developing a kidney injury and electrolyte derangements. She will be observed on the medical/surgical unit for ongoing evaluation and management of severe nausea, likely chemotherapy induced, with inability to tolerate any oral intake  Hospital Course:   N/V -Improved, taking some POs without difficulty. -Will DC home today with a prescription for phenergan.  Lung Cancer -F/u OP with oncology as scheduled. -Newly diagnosed SCC of the left lung.   Procedures:  None   Consultations:  None  Discharge Instructions  Discharge Instructions    Increase activity slowly    Complete by:  As directed      Allergies as of 10/14/2016      Reactions  Aspirin Nausea And Vomiting      Medication List    STOP taking these medications   metoprolol tartrate 25 MG tablet Commonly known as:  LOPRESSOR     TAKE these medications     buPROPion 150 MG 12 hr tablet Commonly known as:  WELLBUTRIN SR TAKE 1 TABLET BY MOUTH TWICE DAILY **KEEP APPT ON 06/09/16** What changed:  how much to take  how to take this  when to take this  additional instructions   calcium carbonate 500 MG chewable tablet Commonly known as:  TUMS - dosed in mg elemental calcium Chew 1 tablet by mouth 2 (two) times a week.   diltiazem 180 MG 24 hr capsule Commonly known as:  CARDIZEM CD TAKE ONE CAPSULE BY MOUTH EVERY DAY   flecainide 50 MG tablet Commonly known as:  TAMBOCOR TAKE 1 TABLET (50 MG TOTAL) BY MOUTH 2 (TWO) TIMES DAILY.   HYDROcodone-acetaminophen 7.5-325 MG tablet Commonly known as:  NORCO One tablet up to four times a day as needed for pain What changed:  how much to take  how to take this  when to take this  reasons to take this  additional instructions   ondansetron 4 MG disintegrating tablet Commonly known as:  ZOFRAN ODT Take 1 tablet by mouth every 6 hours as needed for nausea.   pantoprazole 40 MG tablet Commonly known as:  PROTONIX TAKE 1 TABLET EVERY MORNING   promethazine 12.5 MG tablet Commonly known as:  PHENERGAN Take 1 tablet (12.5 mg total) by mouth every 6 (six) hours as needed for nausea or vomiting.   sucralfate 1 g tablet Commonly known as:  CARAFATE Take 1 tablet (1 g total) by mouth 2 (two) times daily. What changed:  when to take this  reasons to take this   warfarin 5 MG tablet Commonly known as:  COUMADIN TAKE 1 TABLET BY MOUTH EVERY DAY AS DIRECTED What changed:  See the new instructions.      Allergies  Allergen Reactions  . Aspirin Nausea And Vomiting      The results of significant diagnostics from this hospitalization (including imaging, microbiology, ancillary and laboratory) are listed below for reference.    Significant Diagnostic Studies: Ct Abdomen Pelvis W Contrast  Result Date: 10/12/2016 CLINICAL DATA:  Nausea, chills, and headaches. Last bowel  movement 2 weeks prior to imaging. Lung cancer. EXAM: CT ABDOMEN AND PELVIS WITH CONTRAST TECHNIQUE: Multidetector CT imaging of the abdomen and pelvis was performed using the standard protocol following bolus administration of intravenous contrast. CONTRAST:  118mL ISOVUE-300 IOPAMIDOL (ISOVUE-300) INJECTION 61% COMPARISON:  09/13/2016 and 09/27/2016 cross-sectional imaging exams. FINDINGS: Lower chest: Large left lower lobe lung mass with adjacent passive atelectasis, mass measuring about 7.3 cm in long axis on image 1/2. There is some fatty lobulation of the pleura along the inferior margin, along with a small loculated pleural effusion inferomedial to the mass. Hepatobiliary: Mild intrahepatic biliary dilatation and common hepatic duct dilatation up to 1.4 cm, probably a physiologic response to cholecystectomy. Distally the CBD tapers to a more normal level. Pancreas: Unremarkable Spleen: Unremarkable Adrenals/Urinary Tract: 2.6 by 2.4 cm right adrenal adenoma or myelolipoma, small amount of fatty content on image 31/2. Right kidney upper pole cyst.  Urinary bladder unremarkable. Mild scarring of the right kidney upper pole. Probable vascular calcification in the right kidney upper pole. Stomach/Bowel: Unremarkable Vascular/Lymphatic: Aortoiliac atherosclerotic vascular disease. Reproductive: Unremarkable Other: No supplemental non-categorized findings. Musculoskeletal: Mild lumbar spondylosis and degenerative disc disease  without overt impingement. IMPRESSION: 1. The colon is relatively small in caliber, no constipation identified. 2. A large left lower lobe lung mass compatible with lung cancer. Small adjacent loculated left pleural effusion. 3. Right adrenal mass, probably an adenoma, less likely a myelolipoma. 4. Mild intrahepatic and proximal extrahepatic biliary dilatation, likely a physiologic response to cholecystectomy. 5. Right kidney upper pole scarring. 6.  Aortic Atherosclerosis (ICD10-I70.0).  Electronically Signed   By: Van Clines M.D.   On: 10/12/2016 20:07   Dg Abdomen Acute W/chest  Result Date: 10/11/2016 CLINICAL DATA:  Constipation. LEFT upper quadrant pain. Diagnosed with lung cancer 1 month ago. EXAM: DG ABDOMEN ACUTE W/ 1V CHEST COMPARISON:  CT chest Sep 27, 2016 FINDINGS: Cardiomediastinal silhouette is normal. LEFT perihilar lung mass corresponding to known cancer. No pleural effusion. No pneumothorax. Soft tissue planes and included osseous structures are nonacute. RIGHT humeral head suture anchor. Postsurgical changes distal LEFT clavicle. Bowel gas pattern is nondilated and nonobstructive. No significant retained large bowel stool by plain radiography. Surgical clips in the included right abdomen compatible with cholecystectomy. No intra-abdominal mass effect, pathologic calcifications or free air. Soft tissue planes and included osseous structures are non-suspicious. IMPRESSION: LEFT lung mass.  No acute cardiopulmonary process. Nonspecific bowel gas pattern. Electronically Signed   By: Elon Alas M.D.   On: 10/11/2016 23:57    Microbiology: No results found for this or any previous visit (from the past 240 hour(s)).   Labs: Basic Metabolic Panel:  Recent Labs Lab 10/09/16 0813 10/12/16 1805 10/13/16 0557  NA 142 137 138  K 4.0 3.7 3.8  CL  --  98* 101  CO2 28 27 28   GLUCOSE 105 130* 106*  BUN 9.8 15 12   CREATININE 1.1 1.02* 0.90  CALCIUM 10.4 9.1 8.7*   Liver Function Tests:  Recent Labs Lab 10/09/16 0813 10/12/16 1805  AST 12 17  ALT 11 20  ALKPHOS 131 110  BILITOT 0.36 0.7  PROT 8.1 7.8  ALBUMIN 3.4* 3.7    Recent Labs Lab 10/12/16 1805  LIPASE 25   No results for input(s): AMMONIA in the last 168 hours. CBC:  Recent Labs Lab 10/09/16 0813 10/12/16 1805 10/13/16 0557 10/14/16 0504  WBC 9.8 8.8 7.2 6.5  NEUTROABS 7.2* 7.3  --   --   HGB 12.8 12.9 12.3 12.0  HCT 41.0 40.4 38.9 38.4  MCV 86.5 84.2 83.7 84.6  PLT 351  325 302 279   Cardiac Enzymes: No results for input(s): CKTOTAL, CKMB, CKMBINDEX, TROPONINI in the last 168 hours. BNP: BNP (last 3 results) No results for input(s): BNP in the last 8760 hours.  ProBNP (last 3 results) No results for input(s): PROBNP in the last 8760 hours.  CBG: No results for input(s): GLUCAP in the last 168 hours.     SignedLelon Frohlich  [facility] Pager: [phone] 10/14/2016, 6:23 PM

## 2016-10-16 ENCOUNTER — Ambulatory Visit (HOSPITAL_BASED_OUTPATIENT_CLINIC_OR_DEPARTMENT_OTHER): Payer: Medicare Other

## 2016-10-16 ENCOUNTER — Other Ambulatory Visit: Payer: Self-pay | Admitting: Medical Oncology

## 2016-10-16 ENCOUNTER — Other Ambulatory Visit (HOSPITAL_BASED_OUTPATIENT_CLINIC_OR_DEPARTMENT_OTHER): Payer: Medicare Other

## 2016-10-16 VITALS — BP 122/59 | HR 68 | Temp 98.2°F | Resp 18

## 2016-10-16 DIAGNOSIS — C3432 Malignant neoplasm of lower lobe, left bronchus or lung: Secondary | ICD-10-CM | POA: Diagnosis present

## 2016-10-16 DIAGNOSIS — Z5111 Encounter for antineoplastic chemotherapy: Secondary | ICD-10-CM

## 2016-10-16 DIAGNOSIS — C3492 Malignant neoplasm of unspecified part of left bronchus or lung: Secondary | ICD-10-CM

## 2016-10-16 DIAGNOSIS — G4733 Obstructive sleep apnea (adult) (pediatric): Secondary | ICD-10-CM

## 2016-10-16 DIAGNOSIS — Z7189 Other specified counseling: Secondary | ICD-10-CM

## 2016-10-16 DIAGNOSIS — I4891 Unspecified atrial fibrillation: Secondary | ICD-10-CM

## 2016-10-16 LAB — CBC WITH DIFFERENTIAL/PLATELET
BASO%: 0.7 % (ref 0.0–2.0)
BASOS ABS: 0.1 10*3/uL (ref 0.0–0.1)
EOS ABS: 0.1 10*3/uL (ref 0.0–0.5)
EOS%: 1.5 % (ref 0.0–7.0)
HEMATOCRIT: 40.9 % (ref 34.8–46.6)
HEMOGLOBIN: 13.4 g/dL (ref 11.6–15.9)
LYMPH#: 1.2 10*3/uL (ref 0.9–3.3)
LYMPH%: 13.8 % — ABNORMAL LOW (ref 14.0–49.7)
MCH: 26.7 pg (ref 25.1–34.0)
MCHC: 32.6 g/dL (ref 31.5–36.0)
MCV: 81.8 fL (ref 79.5–101.0)
MONO#: 0.7 10*3/uL (ref 0.1–0.9)
MONO%: 8.6 % (ref 0.0–14.0)
NEUT#: 6.4 10*3/uL (ref 1.5–6.5)
NEUT%: 75.4 % (ref 38.4–76.8)
PLATELETS: 308 10*3/uL (ref 145–400)
RBC: 5 10*6/uL (ref 3.70–5.45)
RDW: 16.2 % — AB (ref 11.2–14.5)
WBC: 8.5 10*3/uL (ref 3.9–10.3)

## 2016-10-16 LAB — COMPREHENSIVE METABOLIC PANEL
ALBUMIN: 3.3 g/dL — AB (ref 3.5–5.0)
ALK PHOS: 116 U/L (ref 40–150)
ALT: 18 U/L (ref 0–55)
AST: 15 U/L (ref 5–34)
Anion Gap: 9 mEq/L (ref 3–11)
BILIRUBIN TOTAL: 0.35 mg/dL (ref 0.20–1.20)
BUN: 10.3 mg/dL (ref 7.0–26.0)
CALCIUM: 10 mg/dL (ref 8.4–10.4)
CO2: 27 mEq/L (ref 22–29)
Chloride: 102 mEq/L (ref 98–109)
Creatinine: 1 mg/dL (ref 0.6–1.1)
EGFR: 60 mL/min/{1.73_m2} — AB (ref >90)
Glucose: 89 mg/dl (ref 70–140)
POTASSIUM: 3.9 meq/L (ref 3.5–5.1)
Sodium: 138 mEq/L (ref 136–145)
TOTAL PROTEIN: 7.5 g/dL (ref 6.4–8.3)

## 2016-10-16 MED ORDER — FAMOTIDINE IN NACL 20-0.9 MG/50ML-% IV SOLN
INTRAVENOUS | Status: AC
  Filled 2016-10-16: qty 50

## 2016-10-16 MED ORDER — CARBOPLATIN CHEMO INJECTION 450 MG/45ML
259.2000 mg | Freq: Once | INTRAVENOUS | Status: AC
  Administered 2016-10-16: 260 mg via INTRAVENOUS
  Filled 2016-10-16: qty 26

## 2016-10-16 MED ORDER — PACLITAXEL CHEMO INJECTION 300 MG/50ML
45.0000 mg/m2 | Freq: Once | INTRAVENOUS | Status: AC
  Administered 2016-10-16: 108 mg via INTRAVENOUS
  Filled 2016-10-16: qty 18

## 2016-10-16 MED ORDER — FAMOTIDINE IN NACL 20-0.9 MG/50ML-% IV SOLN
20.0000 mg | Freq: Once | INTRAVENOUS | Status: AC
  Administered 2016-10-16: 20 mg via INTRAVENOUS

## 2016-10-16 MED ORDER — PROMETHAZINE HCL 12.5 MG PO TABS: 12.5000 mg | ORAL_TABLET | Freq: Four times a day (QID) | ORAL | 0 refills | Status: DC | PRN

## 2016-10-16 MED ORDER — HYDROCODONE-ACETAMINOPHEN 7.5-325 MG PO TABS: ORAL_TABLET | ORAL | 0 refills | Status: DC

## 2016-10-16 MED ORDER — PALONOSETRON HCL INJECTION 0.25 MG/5ML
INTRAVENOUS | Status: AC
  Filled 2016-10-16: qty 5

## 2016-10-16 MED ORDER — SODIUM CHLORIDE 0.9 % IV SOLN
Freq: Once | INTRAVENOUS | Status: AC
Start: 2016-10-16 — End: 2016-10-16
  Administered 2016-10-16: 12:00:00 via INTRAVENOUS

## 2016-10-16 MED ORDER — DIPHENHYDRAMINE HCL 50 MG/ML IJ SOLN
50.0000 mg | Freq: Once | INTRAMUSCULAR | Status: AC
  Administered 2016-10-16: 50 mg via INTRAVENOUS

## 2016-10-16 MED ORDER — PALONOSETRON HCL INJECTION 0.25 MG/5ML
0.2500 mg | Freq: Once | INTRAVENOUS | Status: AC
  Administered 2016-10-16: 0.25 mg via INTRAVENOUS

## 2016-10-16 MED ORDER — SODIUM CHLORIDE 0.9 % IV SOLN
20.0000 mg | Freq: Once | INTRAVENOUS | Status: DC
  Filled 2016-10-16: qty 2

## 2016-10-16 MED ORDER — FOSAPREPITANT DIMEGLUMINE INJECTION 150 MG
Freq: Once | INTRAVENOUS | Status: AC
  Administered 2016-10-16: 14:00:00 via INTRAVENOUS
  Filled 2016-10-16: qty 5

## 2016-10-16 MED ORDER — DIPHENHYDRAMINE HCL 50 MG/ML IJ SOLN
INTRAMUSCULAR | Status: AC
  Filled 2016-10-16: qty 1

## 2016-10-16 NOTE — Patient Instructions (Signed)
[facility] Discharge Instructions for Patients Receiving Chemotherapy  Today you received the following chemotherapy agents Taxol /Carboplatin.  To help prevent nausea and vomiting after your treatment, we encourage you to take your nausea medication as needed   If you develop nausea and vomiting that is not controlled by your nausea medication, call the clinic.   BELOW ARE SYMPTOMS THAT SHOULD BE REPORTED IMMEDIATELY:  *FEVER GREATER THAN 100.5 F  *CHILLS WITH OR WITHOUT FEVER  NAUSEA AND VOMITING THAT IS NOT CONTROLLED WITH YOUR NAUSEA MEDICATION  *UNUSUAL SHORTNESS OF BREATH  *UNUSUAL BRUISING OR BLEEDING  TENDERNESS IN MOUTH AND THROAT WITH OR WITHOUT PRESENCE OF ULCERS  *URINARY PROBLEMS  *BOWEL PROBLEMS  UNUSUAL RASH Items with * indicate a potential emergency and should be followed up as soon as possible.  Feel free to call the clinic you have any questions or concerns. The clinic phone number is [phone].  Please show the [facility] at check-in to the Emergency Department and triage nurse.

## 2016-10-17 DIAGNOSIS — C3432 Malignant neoplasm of lower lobe, left bronchus or lung: Secondary | ICD-10-CM | POA: Diagnosis not present

## 2016-10-18 DIAGNOSIS — C3432 Malignant neoplasm of lower lobe, left bronchus or lung: Secondary | ICD-10-CM | POA: Diagnosis not present

## 2016-10-19 ENCOUNTER — Telehealth: Payer: Self-pay | Admitting: Family Medicine

## 2016-10-19 DIAGNOSIS — C3432 Malignant neoplasm of lower lobe, left bronchus or lung: Secondary | ICD-10-CM | POA: Diagnosis not present

## 2016-10-19 NOTE — Telephone Encounter (Signed)
Patient wants to know if she needs to continue doing her INR's since she is doing blood work for the cancer center now?  She will do her next blood work for them on Monday.

## 2016-10-20 ENCOUNTER — Other Ambulatory Visit: Payer: Self-pay

## 2016-10-20 DIAGNOSIS — C3432 Malignant neoplasm of lower lobe, left bronchus or lung: Secondary | ICD-10-CM | POA: Diagnosis not present

## 2016-10-20 NOTE — Telephone Encounter (Signed)
They can do it if they agree to do it

## 2016-10-20 NOTE — Telephone Encounter (Signed)
Discussed with pt. Pt states she will talk with them Monday next week and let us know what they say.

## 2016-10-23 ENCOUNTER — Other Ambulatory Visit (HOSPITAL_BASED_OUTPATIENT_CLINIC_OR_DEPARTMENT_OTHER): Payer: Medicare Other

## 2016-10-23 ENCOUNTER — Ambulatory Visit (HOSPITAL_BASED_OUTPATIENT_CLINIC_OR_DEPARTMENT_OTHER): Payer: Medicare Other

## 2016-10-23 VITALS — BP 112/54 | HR 73 | Temp 98.0°F | Resp 18

## 2016-10-23 DIAGNOSIS — C3432 Malignant neoplasm of lower lobe, left bronchus or lung: Secondary | ICD-10-CM

## 2016-10-23 DIAGNOSIS — Z5111 Encounter for antineoplastic chemotherapy: Secondary | ICD-10-CM

## 2016-10-23 DIAGNOSIS — C3492 Malignant neoplasm of unspecified part of left bronchus or lung: Secondary | ICD-10-CM

## 2016-10-23 DIAGNOSIS — G4733 Obstructive sleep apnea (adult) (pediatric): Secondary | ICD-10-CM

## 2016-10-23 DIAGNOSIS — Z7189 Other specified counseling: Secondary | ICD-10-CM

## 2016-10-23 DIAGNOSIS — I4891 Unspecified atrial fibrillation: Secondary | ICD-10-CM

## 2016-10-23 LAB — CBC WITH DIFFERENTIAL/PLATELET
BASO%: 0.5 % (ref 0.0–2.0)
BASOS ABS: 0 10*3/uL (ref 0.0–0.1)
EOS ABS: 0.1 10*3/uL (ref 0.0–0.5)
EOS%: 1.4 % (ref 0.0–7.0)
HCT: 38.9 % (ref 34.8–46.6)
HGB: 12.7 g/dL (ref 11.6–15.9)
LYMPH%: 9 % — ABNORMAL LOW (ref 14.0–49.7)
MCH: 26.6 pg (ref 25.1–34.0)
MCHC: 32.7 g/dL (ref 31.5–36.0)
MCV: 81.3 fL (ref 79.5–101.0)
MONO#: 0.4 10*3/uL (ref 0.1–0.9)
MONO%: 8.2 % (ref 0.0–14.0)
NEUT#: 4 10*3/uL (ref 1.5–6.5)
NEUT%: 80.9 % — ABNORMAL HIGH (ref 38.4–76.8)
PLATELETS: 238 10*3/uL (ref 145–400)
RBC: 4.78 10*6/uL (ref 3.70–5.45)
RDW: 17 % — ABNORMAL HIGH (ref 11.2–14.5)
WBC: 4.9 10*3/uL (ref 3.9–10.3)
lymph#: 0.4 10*3/uL — ABNORMAL LOW (ref 0.9–3.3)

## 2016-10-23 LAB — COMPREHENSIVE METABOLIC PANEL WITH GFR
ALT: 17 U/L (ref 0–55)
AST: 14 U/L (ref 5–34)
Albumin: 3.2 g/dL — ABNORMAL LOW (ref 3.5–5.0)
Alkaline Phosphatase: 117 U/L (ref 40–150)
Anion Gap: 10 meq/L (ref 3–11)
BUN: 15.6 mg/dL (ref 7.0–26.0)
CO2: 25 meq/L (ref 22–29)
Calcium: 9.6 mg/dL (ref 8.4–10.4)
Chloride: 103 meq/L (ref 98–109)
Creatinine: 0.9 mg/dL (ref 0.6–1.1)
EGFR: 69 ml/min/1.73 m2 — ABNORMAL LOW
Glucose: 123 mg/dL (ref 70–140)
Potassium: 4 meq/L (ref 3.5–5.1)
Sodium: 139 meq/L (ref 136–145)
Total Bilirubin: 0.28 mg/dL (ref 0.20–1.20)
Total Protein: 7 g/dL (ref 6.4–8.3)

## 2016-10-23 MED ORDER — SODIUM CHLORIDE 0.9 % IV SOLN
Freq: Once | INTRAVENOUS | Status: AC
  Administered 2016-10-23: 12:00:00 via INTRAVENOUS

## 2016-10-23 MED ORDER — CARBOPLATIN CHEMO INJECTION 450 MG/45ML
259.2000 mg | Freq: Once | INTRAVENOUS | Status: AC
  Administered 2016-10-23: 260 mg via INTRAVENOUS
  Filled 2016-10-23: qty 26

## 2016-10-23 MED ORDER — DIPHENHYDRAMINE HCL 50 MG/ML IJ SOLN
50.0000 mg | Freq: Once | INTRAMUSCULAR | Status: AC
  Administered 2016-10-23: 50 mg via INTRAVENOUS

## 2016-10-23 MED ORDER — FAMOTIDINE IN NACL 20-0.9 MG/50ML-% IV SOLN
20.0000 mg | Freq: Once | INTRAVENOUS | Status: AC
  Administered 2016-10-23: 20 mg via INTRAVENOUS

## 2016-10-23 MED ORDER — PALONOSETRON HCL INJECTION 0.25 MG/5ML
INTRAVENOUS | Status: AC
  Filled 2016-10-23: qty 5

## 2016-10-23 MED ORDER — DIPHENHYDRAMINE HCL 50 MG/ML IJ SOLN
INTRAMUSCULAR | Status: AC
  Filled 2016-10-23: qty 1

## 2016-10-23 MED ORDER — SODIUM CHLORIDE 0.9 % IV SOLN
Freq: Once | INTRAVENOUS | Status: AC
  Administered 2016-10-23: 12:00:00 via INTRAVENOUS
  Filled 2016-10-23: qty 5

## 2016-10-23 MED ORDER — PALONOSETRON HCL INJECTION 0.25 MG/5ML
0.2500 mg | Freq: Once | INTRAVENOUS | Status: AC
  Administered 2016-10-23: 0.25 mg via INTRAVENOUS

## 2016-10-23 MED ORDER — PACLITAXEL CHEMO INJECTION 300 MG/50ML
45.0000 mg/m2 | Freq: Once | INTRAVENOUS | Status: AC
  Administered 2016-10-23: 108 mg via INTRAVENOUS
  Filled 2016-10-23: qty 18

## 2016-10-23 MED ORDER — FAMOTIDINE IN NACL 20-0.9 MG/50ML-% IV SOLN
INTRAVENOUS | Status: AC
  Filled 2016-10-23: qty 50

## 2016-10-23 NOTE — Progress Notes (Signed)
1130: Pt reports "hand tremors" that started "about a week and a half ago." Dr. Julien Nordmann aware, no new orders at this time, okay to proceed with treatment.

## 2016-10-23 NOTE — Patient Instructions (Signed)
[facility] Discharge Instructions for Patients Receiving Chemotherapy  Today you received the following chemotherapy agents: taxol and Carboplatin   To help prevent nausea and vomiting after your treatment, we encourage you to take your nausea medication as directed.    If you develop nausea and vomiting that is not controlled by your nausea medication, call the clinic.   BELOW ARE SYMPTOMS THAT SHOULD BE REPORTED IMMEDIATELY:  *FEVER GREATER THAN 100.5 F  *CHILLS WITH OR WITHOUT FEVER  NAUSEA AND VOMITING THAT IS NOT CONTROLLED WITH YOUR NAUSEA MEDICATION  *UNUSUAL SHORTNESS OF BREATH  *UNUSUAL BRUISING OR BLEEDING  TENDERNESS IN MOUTH AND THROAT WITH OR WITHOUT PRESENCE OF ULCERS  *URINARY PROBLEMS  *BOWEL PROBLEMS  UNUSUAL RASH Items with * indicate a potential emergency and should be followed up as soon as possible.  Feel free to call the clinic you have any questions or concerns. The clinic phone number is [phone].  Please show the [facility] at check-in to the Emergency Department and triage nurse.

## 2016-10-24 ENCOUNTER — Telehealth: Payer: Self-pay | Admitting: Medical Oncology

## 2016-10-24 DIAGNOSIS — C3432 Malignant neoplasm of lower lobe, left bronchus or lung: Secondary | ICD-10-CM | POA: Diagnosis not present

## 2016-10-24 NOTE — Telephone Encounter (Signed)
Request appt schedule after July 2nd.

## 2016-10-25 DIAGNOSIS — C3432 Malignant neoplasm of lower lobe, left bronchus or lung: Secondary | ICD-10-CM | POA: Diagnosis not present

## 2016-10-25 NOTE — Telephone Encounter (Signed)
Visit every 2 weeks during treatment.

## 2016-10-26 ENCOUNTER — Ambulatory Visit: Payer: Medicare Other

## 2016-10-26 ENCOUNTER — Ambulatory Visit (INDEPENDENT_AMBULATORY_CARE_PROVIDER_SITE_OTHER): Payer: Medicare Other

## 2016-10-26 DIAGNOSIS — C3432 Malignant neoplasm of lower lobe, left bronchus or lung: Secondary | ICD-10-CM | POA: Diagnosis not present

## 2016-10-26 DIAGNOSIS — Z7901 Long term (current) use of anticoagulants: Secondary | ICD-10-CM | POA: Diagnosis not present

## 2016-10-26 LAB — POCT INR: INR: 2.4

## 2016-10-26 NOTE — Patient Instructions (Signed)
Takes half all days but Wednesday take a whole and return in four weeks.

## 2016-10-27 DIAGNOSIS — C3432 Malignant neoplasm of lower lobe, left bronchus or lung: Secondary | ICD-10-CM | POA: Diagnosis not present

## 2016-10-30 ENCOUNTER — Ambulatory Visit (HOSPITAL_BASED_OUTPATIENT_CLINIC_OR_DEPARTMENT_OTHER): Payer: Medicare Other | Admitting: Internal Medicine

## 2016-10-30 ENCOUNTER — Encounter: Payer: Self-pay | Admitting: Internal Medicine

## 2016-10-30 ENCOUNTER — Other Ambulatory Visit (HOSPITAL_BASED_OUTPATIENT_CLINIC_OR_DEPARTMENT_OTHER): Payer: Medicare Other

## 2016-10-30 ENCOUNTER — Telehealth: Payer: Self-pay | Admitting: Internal Medicine

## 2016-10-30 ENCOUNTER — Ambulatory Visit (HOSPITAL_BASED_OUTPATIENT_CLINIC_OR_DEPARTMENT_OTHER): Payer: Medicare Other

## 2016-10-30 VITALS — BP 121/60 | HR 73 | Temp 98.0°F | Resp 17 | Ht 66.0 in | Wt 254.7 lb

## 2016-10-30 DIAGNOSIS — C3432 Malignant neoplasm of lower lobe, left bronchus or lung: Secondary | ICD-10-CM

## 2016-10-30 DIAGNOSIS — T451X5A Adverse effect of antineoplastic and immunosuppressive drugs, initial encounter: Secondary | ICD-10-CM

## 2016-10-30 DIAGNOSIS — I10 Essential (primary) hypertension: Secondary | ICD-10-CM

## 2016-10-30 DIAGNOSIS — Z5111 Encounter for antineoplastic chemotherapy: Secondary | ICD-10-CM

## 2016-10-30 DIAGNOSIS — C3492 Malignant neoplasm of unspecified part of left bronchus or lung: Secondary | ICD-10-CM

## 2016-10-30 DIAGNOSIS — I4891 Unspecified atrial fibrillation: Secondary | ICD-10-CM

## 2016-10-30 DIAGNOSIS — Z7189 Other specified counseling: Secondary | ICD-10-CM

## 2016-10-30 DIAGNOSIS — R112 Nausea with vomiting, unspecified: Secondary | ICD-10-CM

## 2016-10-30 DIAGNOSIS — Z86711 Personal history of pulmonary embolism: Secondary | ICD-10-CM

## 2016-10-30 DIAGNOSIS — R110 Nausea: Secondary | ICD-10-CM | POA: Diagnosis not present

## 2016-10-30 DIAGNOSIS — G4733 Obstructive sleep apnea (adult) (pediatric): Secondary | ICD-10-CM

## 2016-10-30 LAB — CBC WITH DIFFERENTIAL/PLATELET
BASO%: 0.5 % (ref 0.0–2.0)
BASOS ABS: 0 10*3/uL (ref 0.0–0.1)
EOS%: 0.7 % (ref 0.0–7.0)
Eosinophils Absolute: 0 10*3/uL (ref 0.0–0.5)
HEMATOCRIT: 38.8 % (ref 34.8–46.6)
HEMOGLOBIN: 12.6 g/dL (ref 11.6–15.9)
LYMPH#: 0.3 10*3/uL — AB (ref 0.9–3.3)
LYMPH%: 8.8 % — ABNORMAL LOW (ref 14.0–49.7)
MCH: 26.9 pg (ref 25.1–34.0)
MCHC: 32.4 g/dL (ref 31.5–36.0)
MCV: 83 fL (ref 79.5–101.0)
MONO#: 0.3 10*3/uL (ref 0.1–0.9)
MONO%: 9.3 % (ref 0.0–14.0)
NEUT#: 2.9 10*3/uL (ref 1.5–6.5)
NEUT%: 80.7 % — ABNORMAL HIGH (ref 38.4–76.8)
Platelets: 181 10*3/uL (ref 145–400)
RBC: 4.67 10*6/uL (ref 3.70–5.45)
RDW: 17.5 % — AB (ref 11.2–14.5)
WBC: 3.6 10*3/uL — ABNORMAL LOW (ref 3.9–10.3)

## 2016-10-30 LAB — COMPREHENSIVE METABOLIC PANEL
ALBUMIN: 3.3 g/dL — AB (ref 3.5–5.0)
ALK PHOS: 101 U/L (ref 40–150)
ALT: 24 U/L (ref 0–55)
AST: 15 U/L (ref 5–34)
Anion Gap: 9 mEq/L (ref 3–11)
BILIRUBIN TOTAL: 0.22 mg/dL (ref 0.20–1.20)
BUN: 17.9 mg/dL (ref 7.0–26.0)
CALCIUM: 9.4 mg/dL (ref 8.4–10.4)
CO2: 27 mEq/L (ref 22–29)
CREATININE: 0.9 mg/dL (ref 0.6–1.1)
Chloride: 105 mEq/L (ref 98–109)
EGFR: 69 mL/min/{1.73_m2} — ABNORMAL LOW (ref >90)
Glucose: 137 mg/dl (ref 70–140)
Potassium: 4.1 mEq/L (ref 3.5–5.1)
Sodium: 140 mEq/L (ref 136–145)
Total Protein: 6.7 g/dL (ref 6.4–8.3)

## 2016-10-30 LAB — TECHNOLOGIST REVIEW

## 2016-10-30 MED ORDER — DIPHENHYDRAMINE HCL 50 MG/ML IJ SOLN
50.0000 mg | Freq: Once | INTRAMUSCULAR | Status: AC
  Administered 2016-10-30: 50 mg via INTRAVENOUS

## 2016-10-30 MED ORDER — PALONOSETRON HCL INJECTION 0.25 MG/5ML
0.2500 mg | Freq: Once | INTRAVENOUS | Status: AC
  Administered 2016-10-30: 0.25 mg via INTRAVENOUS

## 2016-10-30 MED ORDER — FAMOTIDINE IN NACL 20-0.9 MG/50ML-% IV SOLN
20.0000 mg | Freq: Once | INTRAVENOUS | Status: AC
  Administered 2016-10-30: 20 mg via INTRAVENOUS

## 2016-10-30 MED ORDER — PACLITAXEL CHEMO INJECTION 300 MG/50ML
45.0000 mg/m2 | Freq: Once | INTRAVENOUS | Status: AC
  Administered 2016-10-30: 108 mg via INTRAVENOUS
  Filled 2016-10-30: qty 18

## 2016-10-30 MED ORDER — FAMOTIDINE IN NACL 20-0.9 MG/50ML-% IV SOLN
INTRAVENOUS | Status: AC
  Filled 2016-10-30: qty 50

## 2016-10-30 MED ORDER — PALONOSETRON HCL INJECTION 0.25 MG/5ML
INTRAVENOUS | Status: AC
  Filled 2016-10-30: qty 5

## 2016-10-30 MED ORDER — SODIUM CHLORIDE 0.9 % IV SOLN
Freq: Once | INTRAVENOUS | Status: AC
  Administered 2016-10-30: 12:00:00 via INTRAVENOUS
  Filled 2016-10-30: qty 5

## 2016-10-30 MED ORDER — DIPHENHYDRAMINE HCL 50 MG/ML IJ SOLN
INTRAMUSCULAR | Status: AC
  Filled 2016-10-30: qty 1

## 2016-10-30 MED ORDER — SODIUM CHLORIDE 0.9 % IV SOLN
Freq: Once | INTRAVENOUS | Status: AC
  Administered 2016-10-30: 12:00:00 via INTRAVENOUS

## 2016-10-30 MED ORDER — PROMETHAZINE HCL 12.5 MG PO TABS: 12.5000 mg | ORAL_TABLET | Freq: Four times a day (QID) | ORAL | 0 refills | Status: DC | PRN

## 2016-10-30 MED ORDER — CARBOPLATIN CHEMO INJECTION 450 MG/45ML
259.2000 mg | Freq: Once | INTRAVENOUS | Status: AC
  Administered 2016-10-30: 260 mg via INTRAVENOUS
  Filled 2016-10-30: qty 26

## 2016-10-30 NOTE — Patient Instructions (Signed)
[facility] Discharge Instructions for Patients Receiving Chemotherapy  Today you received the following chemotherapy agents Taxol and Carboplatin. To help prevent nausea and vomiting after your treatment, we encourage you to take your nausea medication as directed.  If you develop nausea and vomiting that is not controlled by your nausea medication, call the clinic.   BELOW ARE SYMPTOMS THAT SHOULD BE REPORTED IMMEDIATELY:  *FEVER GREATER THAN 100.5 F  *CHILLS WITH OR WITHOUT FEVER  NAUSEA AND VOMITING THAT IS NOT CONTROLLED WITH YOUR NAUSEA MEDICATION  *UNUSUAL SHORTNESS OF BREATH  *UNUSUAL BRUISING OR BLEEDING  TENDERNESS IN MOUTH AND THROAT WITH OR WITHOUT PRESENCE OF ULCERS  *URINARY PROBLEMS  *BOWEL PROBLEMS  UNUSUAL RASH Items with * indicate a potential emergency and should be followed up as soon as possible.  Feel free to call the clinic you have any questions or concerns. The clinic phone number is [phone].  Please show the CHEMO ALERT CARD at check-in to the Emergency Department and triage nurse.    

## 2016-10-30 NOTE — Telephone Encounter (Signed)
Scheduled appt per 7/2 los - Gave patient AVS and calender per los.

## 2016-10-30 NOTE — Progress Notes (Signed)
[facility] [phone]   [phone]  OFFICE PROGRESS NOTE  Sarah Kirschner, MD [address] [place] [address]  DIAGNOSIS: Unresectable stage IIIA (T4, N0, M0) non-small cell lung cancer, squamous cell carcinoma diagnosed in May 2018 and presented with large obstructing left lower lobe lung mass suspicious small mediastinal lymphadenopathy.  PRIOR THERAPY: None.  CURRENT THERAPY: A course of concurrent chemoradiation with weekly carboplatin for AUC of 2 and paclitaxel 45 MG/M2. Status post 3 cycles. She received her radiation in [facility].  INTERVAL HISTORY: Sarah [name] 66 y.o. female returns to the clinic today for follow-up visit accompanied by her husband. The patient is feeling fine today with no specific complaints. She is tolerating her treatment fairly well except for mild nausea improved with Phenergan. She denied having any chest pain, shortness of breath, cough or hemoptysis. She denied having any fever or chills. She denied having any dysphagia or odynophagia. She has no weight loss or night sweats. She is here today for evaluation before starting cycle #4.  MEDICAL HISTORY: Past Medical History:  Diagnosis Date  . Arthritis   . Cancer ([place])    lung cancer  . Chronic anxiety    Patient describes as stress  . Chronic back pain   . DVT (deep venous thrombosis) ([facility]) age 73  . GERD (gastroesophageal reflux disease)   . Goals of care, counseling/discussion 09/21/2016  . Headache(784.0)   . Hypoestrogenism   . Lower extremity venous stasis    LLE, chronic  . Obesity   . Paroxysmal atrial fibrillation ([facility])   . PONV (postoperative nausea and vomiting)   . Pulmonary embolism ([place])   . Reflux   . Sleep apnea    wear CPAP  . Spinal stenosis   . Tobacco abuse   . Venous stasis   . Warfarin anticoagulation     ALLERGIES:  is allergic to aspirin.  MEDICATIONS:  Current Outpatient Prescriptions    Medication Sig Dispense Refill  . buPROPion (WELLBUTRIN SR) 150 MG 12 hr tablet TAKE 1 TABLET BY MOUTH TWICE DAILY **KEEP APPT ON 06/09/16** (Patient taking differently: Take 150 mg by mouth daily. ) 180 tablet 3  . Calcium 500-100 MG-UNIT CHEW Chew by mouth.    . calcium carbonate (TUMS - DOSED IN MG ELEMENTAL CALCIUM) 500 MG chewable tablet Chew 1 tablet by mouth 2 (two) times a week.     . diltiazem (CARDIZEM CD) 180 MG 24 hr capsule TAKE ONE CAPSULE BY MOUTH EVERY DAY 90 capsule 1  . flecainide (TAMBOCOR) 50 MG tablet TAKE 1 TABLET (50 MG TOTAL) BY MOUTH 2 (TWO) TIMES DAILY. 60 tablet 6  . HYDROcodone-acetaminophen (NORCO) 7.5-325 MG tablet One tablet up to four times a day as needed for pain 60 tablet 0  . metoprolol tartrate (LOPRESSOR) 25 MG tablet 1 tablet as needed for heart racing    . pantoprazole (PROTONIX) 40 MG tablet TAKE 1 TABLET EVERY MORNING 90 tablet 1  . promethazine (PHENERGAN) 12.5 MG tablet Take 1 tablet (12.5 mg total) by mouth every 6 (six) hours as needed for nausea or vomiting. 30 tablet 0  . warfarin (COUMADIN) 5 MG tablet TAKE 1 TABLET BY MOUTH EVERY DAY AS DIRECTED (Patient taking differently: Take 2.5 mg by mouth daily except take 5mg  on Wednesdays. Takes in the evening) 90 tablet 1  . ondansetron (ZOFRAN ODT) 4 MG disintegrating tablet Take 1 tablet by mouth every 6 hours  as needed for nausea. (Patient not taking: Reported on 10/30/2016) 21 tablet 0  . sucralfate (CARAFATE) 1 g tablet Take 1 tablet (1 g total) by mouth 2 (two) times daily. (Patient not taking: Reported on 10/30/2016) 60 tablet 5   No current facility-administered medications for this visit.     SURGICAL HISTORY:  Past Surgical History:  Procedure Laterality Date  . ANKLE SURGERY     x2  . APPENDECTOMY    . CARPAL TUNNEL RELEASE    . CHOLECYSTECTOMY    . OVARIAN CYST REMOVAL    . ROTATOR CUFF REPAIR    . TUBAL LIGATION    . VIDEO BRONCHOSCOPY WITH ENDOBRONCHIAL ULTRASOUND N/A 09/14/2016    Procedure: VIDEO BRONCHOSCOPY WITH ENDOBRONCHIAL ULTRASOUND;  Surgeon: Melrose Nakayama, MD;  Location: [facility];  Service: Thoracic;  Laterality: N/A;    REVIEW OF SYSTEMS:  Constitutional: positive for fatigue Eyes: negative Ears, nose, mouth, throat, and face: negative Respiratory: negative Cardiovascular: negative Gastrointestinal: positive for nausea Genitourinary:negative Integument/breast: negative Hematologic/lymphatic: negative Musculoskeletal:negative Neurological: negative Behavioral/Psych: negative Endocrine: negative Allergic/Immunologic: negative   PHYSICAL EXAMINATION: General appearance: alert, cooperative, fatigued and no distress Head: Normocephalic, without obvious abnormality, atraumatic Neck: no adenopathy, no JVD, supple, symmetrical, trachea midline and thyroid not enlarged, symmetric, no tenderness/mass/nodules Lymph nodes: Cervical, supraclavicular, and axillary nodes normal. Resp: clear to auscultation bilaterally Back: symmetric, no curvature. ROM normal. No CVA tenderness. Cardio: regular rate and rhythm, S1, S2 normal, no murmur, click, rub or gallop GI: soft, non-tender; bowel sounds normal; no masses,  no organomegaly Extremities: extremities normal, atraumatic, no cyanosis or edema Neurologic: Alert and oriented X 3, normal strength and tone. Normal symmetric reflexes. Normal coordination and gait  ECOG PERFORMANCE STATUS: 1 - Symptomatic but completely ambulatory  Blood pressure 121/60, pulse 73, temperature 98 F (36.7 C), temperature source Oral, resp. rate 17, height 5\' 6"  (1.676 m), weight 254 lb 11.2 oz (115.5 kg), SpO2 98 %.  LABORATORY DATA: Lab Results  Component Value Date   WBC 3.6 (L) 10/30/2016   HGB 12.6 10/30/2016   HCT 38.8 10/30/2016   MCV 83.0 10/30/2016   PLT 181 10/30/2016      Chemistry      Component Value Date/Time   NA 139 10/23/2016 1039   K 4.0 10/23/2016 1039   CL 101 10/13/2016 0557   CO2 25 10/23/2016  1039   BUN 15.6 10/23/2016 1039   CREATININE 0.9 10/23/2016 1039      Component Value Date/Time   CALCIUM 9.6 10/23/2016 1039   ALKPHOS 117 10/23/2016 1039   AST 14 10/23/2016 1039   ALT 17 10/23/2016 1039   BILITOT 0.28 10/23/2016 1039       RADIOGRAPHIC STUDIES: Ct Abdomen Pelvis W Contrast  Result Date: 10/12/2016 CLINICAL DATA:  Nausea, chills, and headaches. Last bowel movement 2 weeks prior to imaging. Lung cancer. EXAM: CT ABDOMEN AND PELVIS WITH CONTRAST TECHNIQUE: Multidetector CT imaging of the abdomen and pelvis was performed using the standard protocol following bolus administration of intravenous contrast. CONTRAST:  168mL ISOVUE-300 IOPAMIDOL (ISOVUE-300) INJECTION 61% COMPARISON:  09/13/2016 and 09/27/2016 cross-sectional imaging exams. FINDINGS: Lower chest: Large left lower lobe lung mass with adjacent passive atelectasis, mass measuring about 7.3 cm in long axis on image 1/2. There is some fatty lobulation of the pleura along the inferior margin, along with a small loculated pleural effusion inferomedial to the mass. Hepatobiliary: Mild intrahepatic biliary dilatation and common hepatic duct dilatation up to 1.4 cm, probably a physiologic  response to cholecystectomy. Distally the CBD tapers to a more normal level. Pancreas: Unremarkable Spleen: Unremarkable Adrenals/Urinary Tract: 2.6 by 2.4 cm right adrenal adenoma or myelolipoma, small amount of fatty content on image 31/2. Right kidney upper pole cyst.  Urinary bladder unremarkable. Mild scarring of the right kidney upper pole. Probable vascular calcification in the right kidney upper pole. Stomach/Bowel: Unremarkable Vascular/Lymphatic: Aortoiliac atherosclerotic vascular disease. Reproductive: Unremarkable Other: No supplemental non-categorized findings. Musculoskeletal: Mild lumbar spondylosis and degenerative disc disease without overt impingement. IMPRESSION: 1. The colon is relatively small in caliber, no constipation  identified. 2. A large left lower lobe lung mass compatible with lung cancer. Small adjacent loculated left pleural effusion. 3. Right adrenal mass, probably an adenoma, less likely a myelolipoma. 4. Mild intrahepatic and proximal extrahepatic biliary dilatation, likely a physiologic response to cholecystectomy. 5. Right kidney upper pole scarring. 6.  Aortic Atherosclerosis (ICD10-I70.0). Electronically Signed   By: Van Clines M.D.   On: 10/12/2016 20:07   Dg Abdomen Acute W/chest  Result Date: 10/11/2016 CLINICAL DATA:  Constipation. LEFT upper quadrant pain. Diagnosed with lung cancer 1 month ago. EXAM: DG ABDOMEN ACUTE W/ 1V CHEST COMPARISON:  CT chest Sep 27, 2016 FINDINGS: Cardiomediastinal silhouette is normal. LEFT perihilar lung mass corresponding to known cancer. No pleural effusion. No pneumothorax. Soft tissue planes and included osseous structures are nonacute. RIGHT humeral head suture anchor. Postsurgical changes distal LEFT clavicle. Bowel gas pattern is nondilated and nonobstructive. No significant retained large bowel stool by plain radiography. Surgical clips in the included right abdomen compatible with cholecystectomy. No intra-abdominal mass effect, pathologic calcifications or free air. Soft tissue planes and included osseous structures are non-suspicious. IMPRESSION: LEFT lung mass.  No acute cardiopulmonary process. Nonspecific bowel gas pattern. Electronically Signed   By: Elon Alas M.D.   On: 10/11/2016 23:57    ASSESSMENT AND PLAN:  This is a very pleasant 66 years old with unresectable a stage IIIa non-small cell lung cancer. The patient is currently undergoing a course of concurrent chemoradiation with weekly carboplatin and paclitaxel status post 3 cycles and has been tolerating this treatment fairly well. I recommended for her to proceed with cycle #4 today as a scheduled. I will continue to monitor her closely every other week For hypertension, the patient  was advised to take her blood pressure medication as prescribed. For nausea, I will be given to patient refill of Phenergan. She was advised to call immediately if she has any concerning symptoms in the interval. The patient voices understanding of current disease status and treatment options and is in agreement with the current care plan.  All questions were answered. The patient knows to call the clinic with any problems, questions or concerns. We can certainly see the patient much sooner if necessary.  I spent 15 minutes counseling the patient face to face. The total time spent in the appointment was 25 minutes.  Disclaimer: This note was dictated with voice recognition software. Similar sounding words can inadvertently be transcribed and may not be corrected upon review.

## 2016-10-31 DIAGNOSIS — C3432 Malignant neoplasm of lower lobe, left bronchus or lung: Secondary | ICD-10-CM | POA: Diagnosis not present

## 2016-11-02 DIAGNOSIS — C3432 Malignant neoplasm of lower lobe, left bronchus or lung: Secondary | ICD-10-CM | POA: Diagnosis not present

## 2016-11-03 DIAGNOSIS — C3432 Malignant neoplasm of lower lobe, left bronchus or lung: Secondary | ICD-10-CM | POA: Diagnosis not present

## 2016-11-06 ENCOUNTER — Other Ambulatory Visit: Payer: Medicare Other

## 2016-11-06 ENCOUNTER — Other Ambulatory Visit: Payer: Self-pay | Admitting: Internal Medicine

## 2016-11-06 ENCOUNTER — Ambulatory Visit: Payer: Medicare Other

## 2016-11-06 ENCOUNTER — Other Ambulatory Visit: Payer: Self-pay | Admitting: *Deleted

## 2016-11-06 ENCOUNTER — Other Ambulatory Visit: Payer: Self-pay | Admitting: Medical Oncology

## 2016-11-06 ENCOUNTER — Other Ambulatory Visit (HOSPITAL_BASED_OUTPATIENT_CLINIC_OR_DEPARTMENT_OTHER): Payer: Medicare Other

## 2016-11-06 ENCOUNTER — Ambulatory Visit (HOSPITAL_BASED_OUTPATIENT_CLINIC_OR_DEPARTMENT_OTHER): Payer: Medicare Other

## 2016-11-06 VITALS — BP 126/55 | HR 96 | Temp 96.8°F | Resp 16

## 2016-11-06 DIAGNOSIS — C3432 Malignant neoplasm of lower lobe, left bronchus or lung: Secondary | ICD-10-CM

## 2016-11-06 DIAGNOSIS — C3492 Malignant neoplasm of unspecified part of left bronchus or lung: Secondary | ICD-10-CM

## 2016-11-06 DIAGNOSIS — Z5111 Encounter for antineoplastic chemotherapy: Secondary | ICD-10-CM

## 2016-11-06 DIAGNOSIS — Z7189 Other specified counseling: Secondary | ICD-10-CM

## 2016-11-06 DIAGNOSIS — I4891 Unspecified atrial fibrillation: Secondary | ICD-10-CM

## 2016-11-06 DIAGNOSIS — G4733 Obstructive sleep apnea (adult) (pediatric): Secondary | ICD-10-CM

## 2016-11-06 LAB — COMPREHENSIVE METABOLIC PANEL
ALK PHOS: 101 U/L (ref 40–150)
ALT: 28 U/L (ref 0–55)
AST: 15 U/L (ref 5–34)
Albumin: 3.2 g/dL — ABNORMAL LOW (ref 3.5–5.0)
Anion Gap: 9 mEq/L (ref 3–11)
BUN: 14.1 mg/dL (ref 7.0–26.0)
CALCIUM: 9.4 mg/dL (ref 8.4–10.4)
CO2: 26 mEq/L (ref 22–29)
Chloride: 106 mEq/L (ref 98–109)
Creatinine: 0.8 mg/dL (ref 0.6–1.1)
EGFR: 76 mL/min/{1.73_m2} — AB (ref >90)
Glucose: 125 mg/dl (ref 70–140)
POTASSIUM: 4.1 meq/L (ref 3.5–5.1)
Sodium: 141 mEq/L (ref 136–145)
Total Bilirubin: 0.3 mg/dL (ref 0.20–1.20)
Total Protein: 6.6 g/dL (ref 6.4–8.3)

## 2016-11-06 LAB — CBC WITH DIFFERENTIAL/PLATELET
BASO%: 0.3 % (ref 0.0–2.0)
BASOS ABS: 0 10*3/uL (ref 0.0–0.1)
EOS%: 0.4 % (ref 0.0–7.0)
Eosinophils Absolute: 0 10*3/uL (ref 0.0–0.5)
HEMATOCRIT: 39.2 % (ref 34.8–46.6)
HGB: 12.6 g/dL (ref 11.6–15.9)
LYMPH#: 0.3 10*3/uL — AB (ref 0.9–3.3)
LYMPH%: 6.4 % — ABNORMAL LOW (ref 14.0–49.7)
MCH: 27.1 pg (ref 25.1–34.0)
MCHC: 32.2 g/dL (ref 31.5–36.0)
MCV: 83.9 fL (ref 79.5–101.0)
MONO#: 0.4 10*3/uL (ref 0.1–0.9)
MONO%: 9 % (ref 0.0–14.0)
NEUT#: 3.4 10*3/uL (ref 1.5–6.5)
NEUT%: 83.9 % — AB (ref 38.4–76.8)
Platelets: 127 10*3/uL — ABNORMAL LOW (ref 145–400)
RBC: 4.67 10*6/uL (ref 3.70–5.45)
RDW: 17.9 % — ABNORMAL HIGH (ref 11.2–14.5)
WBC: 4 10*3/uL (ref 3.9–10.3)

## 2016-11-06 LAB — TECHNOLOGIST REVIEW

## 2016-11-06 MED ORDER — SODIUM CHLORIDE 0.9 % IV SOLN
Freq: Once | INTRAVENOUS | Status: AC
  Administered 2016-11-06: 12:00:00 via INTRAVENOUS

## 2016-11-06 MED ORDER — BUPROPION HCL ER (SR) 150 MG PO TB12: ORAL_TABLET | ORAL | 0 refills | Status: DC

## 2016-11-06 MED ORDER — HYDROCODONE-ACETAMINOPHEN 7.5-325 MG PO TABS: ORAL_TABLET | ORAL | 0 refills | Status: DC

## 2016-11-06 MED ORDER — FAMOTIDINE IN NACL 20-0.9 MG/50ML-% IV SOLN
20.0000 mg | Freq: Once | INTRAVENOUS | Status: AC
  Administered 2016-11-06: 20 mg via INTRAVENOUS

## 2016-11-06 MED ORDER — DIPHENHYDRAMINE HCL 50 MG/ML IJ SOLN
INTRAMUSCULAR | Status: AC
  Filled 2016-11-06: qty 1

## 2016-11-06 MED ORDER — SODIUM CHLORIDE 0.9 % IV SOLN
Freq: Once | INTRAVENOUS | Status: AC
  Administered 2016-11-06: 12:00:00 via INTRAVENOUS
  Filled 2016-11-06: qty 5

## 2016-11-06 MED ORDER — DIPHENHYDRAMINE HCL 50 MG/ML IJ SOLN
50.0000 mg | Freq: Once | INTRAMUSCULAR | Status: AC
  Administered 2016-11-06: 50 mg via INTRAVENOUS

## 2016-11-06 MED ORDER — FAMOTIDINE IN NACL 20-0.9 MG/50ML-% IV SOLN
INTRAVENOUS | Status: AC
  Filled 2016-11-06: qty 50

## 2016-11-06 MED ORDER — PALONOSETRON HCL INJECTION 0.25 MG/5ML
0.2500 mg | Freq: Once | INTRAVENOUS | Status: AC
  Administered 2016-11-06: 0.25 mg via INTRAVENOUS

## 2016-11-06 MED ORDER — PALONOSETRON HCL INJECTION 0.25 MG/5ML
INTRAVENOUS | Status: AC
  Filled 2016-11-06: qty 5

## 2016-11-06 MED ORDER — PACLITAXEL CHEMO INJECTION 300 MG/50ML
45.0000 mg/m2 | Freq: Once | INTRAVENOUS | Status: AC
  Administered 2016-11-06: 108 mg via INTRAVENOUS
  Filled 2016-11-06: qty 18

## 2016-11-06 MED ORDER — SUCRALFATE 1 G PO TABS: 1.0000 g | ORAL_TABLET | Freq: Two times a day (BID) | ORAL | 0 refills | Status: AC

## 2016-11-06 MED ORDER — SODIUM CHLORIDE 0.9 % IV SOLN
259.2000 mg | Freq: Once | INTRAVENOUS | Status: AC
  Administered 2016-11-06: 260 mg via INTRAVENOUS
  Filled 2016-11-06: qty 26

## 2016-11-06 NOTE — Patient Instructions (Signed)
[facility] Discharge Instructions for Patients Receiving Chemotherapy  Today you received the following chemotherapy agents Taxol and Carboplatin. To help prevent nausea and vomiting after your treatment, we encourage you to take your nausea medication as directed.  If you develop nausea and vomiting that is not controlled by your nausea medication, call the clinic.   BELOW ARE SYMPTOMS THAT SHOULD BE REPORTED IMMEDIATELY:  *FEVER GREATER THAN 100.5 F  *CHILLS WITH OR WITHOUT FEVER  NAUSEA AND VOMITING THAT IS NOT CONTROLLED WITH YOUR NAUSEA MEDICATION  *UNUSUAL SHORTNESS OF BREATH  *UNUSUAL BRUISING OR BLEEDING  TENDERNESS IN MOUTH AND THROAT WITH OR WITHOUT PRESENCE OF ULCERS  *URINARY PROBLEMS  *BOWEL PROBLEMS  UNUSUAL RASH Items with * indicate a potential emergency and should be followed up as soon as possible.  Feel free to call the clinic you have any questions or concerns. The clinic phone number is [phone].  Please show the CHEMO ALERT CARD at check-in to the Emergency Department and triage nurse.    

## 2016-11-07 DIAGNOSIS — C3432 Malignant neoplasm of lower lobe, left bronchus or lung: Secondary | ICD-10-CM | POA: Diagnosis not present

## 2016-11-08 DIAGNOSIS — C3432 Malignant neoplasm of lower lobe, left bronchus or lung: Secondary | ICD-10-CM | POA: Diagnosis not present

## 2016-11-09 DIAGNOSIS — C3432 Malignant neoplasm of lower lobe, left bronchus or lung: Secondary | ICD-10-CM | POA: Diagnosis not present

## 2016-11-10 ENCOUNTER — Other Ambulatory Visit: Payer: Self-pay | Admitting: Internal Medicine

## 2016-11-10 DIAGNOSIS — C3432 Malignant neoplasm of lower lobe, left bronchus or lung: Secondary | ICD-10-CM | POA: Diagnosis not present

## 2016-11-10 DIAGNOSIS — C3492 Malignant neoplasm of unspecified part of left bronchus or lung: Secondary | ICD-10-CM

## 2016-11-13 ENCOUNTER — Ambulatory Visit (HOSPITAL_BASED_OUTPATIENT_CLINIC_OR_DEPARTMENT_OTHER): Payer: Medicare Other | Admitting: Internal Medicine

## 2016-11-13 ENCOUNTER — Telehealth: Payer: Self-pay | Admitting: Internal Medicine

## 2016-11-13 ENCOUNTER — Encounter: Payer: Self-pay | Admitting: Internal Medicine

## 2016-11-13 ENCOUNTER — Other Ambulatory Visit (HOSPITAL_BASED_OUTPATIENT_CLINIC_OR_DEPARTMENT_OTHER): Payer: Medicare Other

## 2016-11-13 ENCOUNTER — Ambulatory Visit (HOSPITAL_BASED_OUTPATIENT_CLINIC_OR_DEPARTMENT_OTHER): Payer: Medicare Other

## 2016-11-13 VITALS — BP 125/55 | HR 99 | Temp 98.7°F | Resp 18 | Ht 66.0 in | Wt 252.4 lb

## 2016-11-13 DIAGNOSIS — Z5111 Encounter for antineoplastic chemotherapy: Secondary | ICD-10-CM

## 2016-11-13 DIAGNOSIS — R1310 Dysphagia, unspecified: Secondary | ICD-10-CM | POA: Diagnosis not present

## 2016-11-13 DIAGNOSIS — C3432 Malignant neoplasm of lower lobe, left bronchus or lung: Secondary | ICD-10-CM

## 2016-11-13 DIAGNOSIS — K219 Gastro-esophageal reflux disease without esophagitis: Secondary | ICD-10-CM

## 2016-11-13 DIAGNOSIS — Z7189 Other specified counseling: Secondary | ICD-10-CM

## 2016-11-13 DIAGNOSIS — C3492 Malignant neoplasm of unspecified part of left bronchus or lung: Secondary | ICD-10-CM

## 2016-11-13 DIAGNOSIS — R5383 Other fatigue: Secondary | ICD-10-CM

## 2016-11-13 DIAGNOSIS — R112 Nausea with vomiting, unspecified: Secondary | ICD-10-CM

## 2016-11-13 DIAGNOSIS — R110 Nausea: Secondary | ICD-10-CM

## 2016-11-13 DIAGNOSIS — T451X5A Adverse effect of antineoplastic and immunosuppressive drugs, initial encounter: Secondary | ICD-10-CM

## 2016-11-13 DIAGNOSIS — I4891 Unspecified atrial fibrillation: Secondary | ICD-10-CM

## 2016-11-13 DIAGNOSIS — G4733 Obstructive sleep apnea (adult) (pediatric): Secondary | ICD-10-CM

## 2016-11-13 HISTORY — DX: Dysphagia, unspecified: R13.10

## 2016-11-13 LAB — TECHNOLOGIST REVIEW

## 2016-11-13 LAB — CBC WITH DIFFERENTIAL/PLATELET
BASO%: 0.5 % (ref 0.0–2.0)
Basophils Absolute: 0 10*3/uL (ref 0.0–0.1)
EOS ABS: 0 10*3/uL (ref 0.0–0.5)
EOS%: 0.2 % (ref 0.0–7.0)
HCT: 42.7 % (ref 34.8–46.6)
HGB: 13.6 g/dL (ref 11.6–15.9)
LYMPH%: 7.9 % — AB (ref 14.0–49.7)
MCH: 27.8 pg (ref 25.1–34.0)
MCHC: 31.9 g/dL (ref 31.5–36.0)
MCV: 87.1 fL (ref 79.5–101.0)
MONO#: 0.3 10*3/uL (ref 0.1–0.9)
MONO%: 6.5 % (ref 0.0–14.0)
NEUT%: 84.9 % — ABNORMAL HIGH (ref 38.4–76.8)
NEUTROS ABS: 3.8 10*3/uL (ref 1.5–6.5)
PLATELETS: 120 10*3/uL — AB (ref 145–400)
RBC: 4.9 10*6/uL (ref 3.70–5.45)
RDW: 20.2 % — ABNORMAL HIGH (ref 11.2–14.5)
WBC: 4.4 10*3/uL (ref 3.9–10.3)
lymph#: 0.4 10*3/uL — ABNORMAL LOW (ref 0.9–3.3)

## 2016-11-13 LAB — COMPREHENSIVE METABOLIC PANEL
ALBUMIN: 3.3 g/dL — AB (ref 3.5–5.0)
ALK PHOS: 96 U/L (ref 40–150)
ALT: 26 U/L (ref 0–55)
ANION GAP: 13 meq/L — AB (ref 3–11)
AST: 14 U/L (ref 5–34)
BILIRUBIN TOTAL: 0.32 mg/dL (ref 0.20–1.20)
BUN: 13.1 mg/dL (ref 7.0–26.0)
CALCIUM: 9.6 mg/dL (ref 8.4–10.4)
CO2: 22 meq/L (ref 22–29)
Chloride: 109 mEq/L (ref 98–109)
Creatinine: 1.1 mg/dL (ref 0.6–1.1)
EGFR: 54 mL/min/{1.73_m2} — ABNORMAL LOW (ref >90)
Glucose: 155 mg/dl — ABNORMAL HIGH (ref 70–140)
Potassium: 4.3 mEq/L (ref 3.5–5.1)
Sodium: 144 mEq/L (ref 136–145)
TOTAL PROTEIN: 6.7 g/dL (ref 6.4–8.3)

## 2016-11-13 MED ORDER — SODIUM CHLORIDE 0.9 % IV SOLN
Freq: Once | INTRAVENOUS | Status: AC
  Administered 2016-11-13: 10:00:00 via INTRAVENOUS

## 2016-11-13 MED ORDER — DIPHENHYDRAMINE HCL 50 MG/ML IJ SOLN
INTRAMUSCULAR | Status: AC
Start: 2016-11-13 — End: ?
  Filled 2016-11-13: qty 1

## 2016-11-13 MED ORDER — SODIUM CHLORIDE 0.9 % IV SOLN
Freq: Once | INTRAVENOUS | Status: AC
  Administered 2016-11-13: 11:00:00 via INTRAVENOUS
  Filled 2016-11-13: qty 5

## 2016-11-13 MED ORDER — PALONOSETRON HCL INJECTION 0.25 MG/5ML
INTRAVENOUS | Status: AC
  Filled 2016-11-13: qty 5

## 2016-11-13 MED ORDER — PACLITAXEL CHEMO INJECTION 300 MG/50ML
45.0000 mg/m2 | Freq: Once | INTRAVENOUS | Status: AC
  Administered 2016-11-13: 108 mg via INTRAVENOUS
  Filled 2016-11-13: qty 18

## 2016-11-13 MED ORDER — DIPHENHYDRAMINE HCL 50 MG/ML IJ SOLN
50.0000 mg | Freq: Once | INTRAMUSCULAR | Status: AC
  Administered 2016-11-13: 50 mg via INTRAVENOUS

## 2016-11-13 MED ORDER — SODIUM CHLORIDE 0.9 % IV SOLN
260.0000 mg | Freq: Once | INTRAVENOUS | Status: AC
  Administered 2016-11-13: 260 mg via INTRAVENOUS
  Filled 2016-11-13: qty 26

## 2016-11-13 MED ORDER — PALONOSETRON HCL INJECTION 0.25 MG/5ML
0.2500 mg | Freq: Once | INTRAVENOUS | Status: AC
  Administered 2016-11-13: 0.25 mg via INTRAVENOUS

## 2016-11-13 MED ORDER — FAMOTIDINE IN NACL 20-0.9 MG/50ML-% IV SOLN
20.0000 mg | Freq: Once | INTRAVENOUS | Status: AC
  Administered 2016-11-13: 20 mg via INTRAVENOUS

## 2016-11-13 MED ORDER — FAMOTIDINE IN NACL 20-0.9 MG/50ML-% IV SOLN
INTRAVENOUS | Status: AC
  Filled 2016-11-13: qty 50

## 2016-11-13 NOTE — Progress Notes (Signed)
[facility] Telephone:([phone]   [phone]  OFFICE PROGRESS NOTE  [name], MD [address] [place] [address]  DIAGNOSIS: Unresectable stage IIIA (T4, N0, M0) non-small cell lung cancer, squamous cell carcinoma diagnosed in May 2018 and presented with large obstructing left lower lobe lung mass suspicious small mediastinal lymphadenopathy.  PRIOR THERAPY: None.  CURRENT THERAPY: A course of concurrent chemoradiation with weekly carboplatin for AUC of 2 and paclitaxel 45 MG/M2. Status post 5 cycles. She received her radiation in [facility].  INTERVAL HISTORY: Sarah [name] 66 y.o. female returns to the clinic today for follow-up visit accompanied by her husband. The patient continues to tolerate the course of concurrent chemoradiation fairly well except for intermittent nausea. She has Phenergan at regular basis. She also has occasional dizzy spells. She denied having any weight loss or night sweats. She has no current nausea, vomiting, diarrhea or constipation. She has mild odynophagia and she does not take any medication for it. She has no fever or chills. The patient denied having any chest pain, shortness of breath, cough or hemoptysis. She is here today for evaluation before starting cycle #6.  MEDICAL HISTORY: Past Medical History:  Diagnosis Date  . Arthritis   . Cancer ([place])    lung cancer  . Chronic anxiety    Patient describes as stress  . Chronic back pain   . DVT (deep venous thrombosis) ([facility]) age 74  . GERD (gastroesophageal reflux disease)   . Goals of care, counseling/discussion 09/21/2016  . Headache(784.0)   . Hypoestrogenism   . Lower extremity venous stasis    LLE, chronic  . Obesity   . Paroxysmal atrial fibrillation ([facility])   . PONV (postoperative nausea and vomiting)   . Pulmonary embolism ([place])   . Reflux   . Sleep apnea    wear CPAP  . Spinal stenosis   . Tobacco abuse   . Venous stasis    . Warfarin anticoagulation     ALLERGIES:  is allergic to aspirin.  MEDICATIONS:  Current Outpatient Prescriptions  Medication Sig Dispense Refill  . buPROPion (WELLBUTRIN SR) 150 MG 12 hr tablet TAKE 1 TABLET BY MOUTH TWICE DAILY **KEEP APPT ON 06/09/16** 180 tablet 0  . Calcium 500-100 MG-UNIT CHEW Chew by mouth.    . calcium carbonate (TUMS - DOSED IN MG ELEMENTAL CALCIUM) 500 MG chewable tablet Chew 1 tablet by mouth 2 (two) times a week.     . diltiazem (CARDIZEM CD) 180 MG 24 hr capsule TAKE ONE CAPSULE BY MOUTH EVERY DAY 90 capsule 1  . flecainide (TAMBOCOR) 50 MG tablet TAKE 1 TABLET (50 MG TOTAL) BY MOUTH 2 (TWO) TIMES DAILY. 60 tablet 6  . HYDROcodone-acetaminophen (NORCO) 7.5-325 MG tablet One tablet up to four times a day as needed for pain 60 tablet 0  . metoprolol tartrate (LOPRESSOR) 25 MG tablet 1 tablet as needed for heart racing    . ondansetron (ZOFRAN ODT) 4 MG disintegrating tablet Take 1 tablet by mouth every 6 hours as needed for nausea. (Patient not taking: Reported on 10/30/2016) 21 tablet 0  . pantoprazole (PROTONIX) 40 MG tablet TAKE 1 TABLET EVERY MORNING 90 tablet 1  . promethazine (PHENERGAN) 12.5 MG tablet Take 1 tablet (12.5 mg total) by mouth every 6 (six) hours as needed for nausea or vomiting. 30 tablet 0  . sucralfate (CARAFATE) 1 g tablet Take 1 tablet (1 g total) by mouth  2 (two) times daily. 60 tablet 0  . warfarin (COUMADIN) 5 MG tablet TAKE 1 TABLET BY MOUTH EVERY DAY AS DIRECTED (Patient taking differently: Take 2.5 mg by mouth daily except take 5mg  on Wednesdays. Takes in the evening) 90 tablet 1   No current facility-administered medications for this visit.     SURGICAL HISTORY:  Past Surgical History:  Procedure Laterality Date  . ANKLE SURGERY     x2  . APPENDECTOMY    . CARPAL TUNNEL RELEASE    . CHOLECYSTECTOMY    . OVARIAN CYST REMOVAL    . ROTATOR CUFF REPAIR    . TUBAL LIGATION    . VIDEO BRONCHOSCOPY WITH ENDOBRONCHIAL ULTRASOUND  N/A 09/14/2016   Procedure: VIDEO BRONCHOSCOPY WITH ENDOBRONCHIAL ULTRASOUND;  Surgeon: Melrose Nakayama, MD;  Location: [facility];  Service: Thoracic;  Laterality: N/A;    REVIEW OF SYSTEMS:  A comprehensive review of systems was negative except for: Constitutional: positive for fatigue Gastrointestinal: positive for odynophagia   PHYSICAL EXAMINATION: General appearance: alert, cooperative, fatigued and no distress Head: Normocephalic, without obvious abnormality, atraumatic Neck: no adenopathy, no JVD, supple, symmetrical, trachea midline and thyroid not enlarged, symmetric, no tenderness/mass/nodules Lymph nodes: Cervical, supraclavicular, and axillary nodes normal. Resp: clear to auscultation bilaterally Back: symmetric, no curvature. ROM normal. No CVA tenderness. Cardio: regular rate and rhythm, S1, S2 normal, no murmur, click, rub or gallop GI: soft, non-tender; bowel sounds normal; no masses,  no organomegaly Extremities: extremities normal, atraumatic, no cyanosis or edema  ECOG PERFORMANCE STATUS: 1 - Symptomatic but completely ambulatory  Blood pressure (!) 125/55, pulse 99, temperature 98.7 F (37.1 C), temperature source Oral, resp. rate 18, height 5\' 6"  (1.676 m), weight 252 lb 6.4 oz (114.5 kg), SpO2 100 %.  LABORATORY DATA: Lab Results  Component Value Date   WBC 4.4 11/13/2016   HGB 13.6 11/13/2016   HCT 42.7 11/13/2016   MCV 87.1 11/13/2016   PLT 120 (L) 11/13/2016      Chemistry      Component Value Date/Time   NA 141 11/06/2016 1046   K 4.1 11/06/2016 1046   CL 101 10/13/2016 0557   CO2 26 11/06/2016 1046   BUN 14.1 11/06/2016 1046   CREATININE 0.8 11/06/2016 1046      Component Value Date/Time   CALCIUM 9.4 11/06/2016 1046   ALKPHOS 101 11/06/2016 1046   AST 15 11/06/2016 1046   ALT 28 11/06/2016 1046   BILITOT 0.30 11/06/2016 1046       RADIOGRAPHIC STUDIES: No results found.  ASSESSMENT AND PLAN:  This is a very pleasant 66 years old  with unresectable a stage IIIa non-small cell lung cancer. The patient is currently undergoing a course of concurrent chemoradiation with weekly carboplatin and paclitaxel status post 5 cycles and has been tolerating this treatment fairly well except for mild fatigue and odynophagia. I recommended for the patient to proceed with cycle #6 today as scheduled. She still has 3 weeks of radiation to complete this course. I will see her back for follow-up visit in 2 weeks for reevaluation with the start of cycle #8. For the odynophagia, I advised the patient to start taking Carafate for times daily. For the nausea, she will continue on Phenergan. The patient was advised to call immediately if she has any concerning symptoms in the interval. The patient voices understanding of current disease status and treatment options and is in agreement with the current care plan. All questions were answered. The patient  knows to call the clinic with any problems, questions or concerns. We can certainly see the patient much sooner if necessary. I spent 10 minutes counseling the patient face to face. The total time spent in the appointment was 15 minutes.  Disclaimer: This note was dictated with voice recognition software. Similar sounding words can inadvertently be transcribed and may not be corrected upon review.

## 2016-11-13 NOTE — Patient Instructions (Signed)
[facility] Discharge Instructions for Patients Receiving Chemotherapy  Today you received the following chemotherapy agents: paclitaxel (Taxol) and Carboplatin   To help prevent nausea and vomiting after your treatment, we encourage you to take your nausea medication as directed.    If you develop nausea and vomiting that is not controlled by your nausea medication, call the clinic.   BELOW ARE SYMPTOMS THAT SHOULD BE REPORTED IMMEDIATELY:  *FEVER GREATER THAN 100.5 F  *CHILLS WITH OR WITHOUT FEVER  NAUSEA AND VOMITING THAT IS NOT CONTROLLED WITH YOUR NAUSEA MEDICATION  *UNUSUAL SHORTNESS OF BREATH  *UNUSUAL BRUISING OR BLEEDING  TENDERNESS IN MOUTH AND THROAT WITH OR WITHOUT PRESENCE OF ULCERS  *URINARY PROBLEMS  *BOWEL PROBLEMS  UNUSUAL RASH Items with * indicate a potential emergency and should be followed up as soon as possible.  Feel free to call the clinic you have any questions or concerns. The clinic phone number is [phone].  Please show the [facility] at check-in to the Emergency Department and triage nurse.

## 2016-11-13 NOTE — Telephone Encounter (Signed)
Scheduled appt per 7/16 los - Gave patient AVS and calender per los.per MM okay for f/u to be on 8/1 with APP due to availability

## 2016-11-14 DIAGNOSIS — C3432 Malignant neoplasm of lower lobe, left bronchus or lung: Secondary | ICD-10-CM | POA: Diagnosis not present

## 2016-11-15 DIAGNOSIS — C3432 Malignant neoplasm of lower lobe, left bronchus or lung: Secondary | ICD-10-CM | POA: Diagnosis not present

## 2016-11-16 DIAGNOSIS — C3432 Malignant neoplasm of lower lobe, left bronchus or lung: Secondary | ICD-10-CM | POA: Diagnosis not present

## 2016-11-17 ENCOUNTER — Other Ambulatory Visit: Payer: Self-pay

## 2016-11-17 DIAGNOSIS — C3432 Malignant neoplasm of lower lobe, left bronchus or lung: Secondary | ICD-10-CM | POA: Diagnosis not present

## 2016-11-20 ENCOUNTER — Other Ambulatory Visit (HOSPITAL_BASED_OUTPATIENT_CLINIC_OR_DEPARTMENT_OTHER): Payer: Medicare Other

## 2016-11-20 ENCOUNTER — Ambulatory Visit (HOSPITAL_BASED_OUTPATIENT_CLINIC_OR_DEPARTMENT_OTHER): Payer: Medicare Other

## 2016-11-20 VITALS — BP 112/56 | HR 90 | Temp 98.0°F | Resp 19

## 2016-11-20 DIAGNOSIS — C3432 Malignant neoplasm of lower lobe, left bronchus or lung: Secondary | ICD-10-CM

## 2016-11-20 DIAGNOSIS — I4891 Unspecified atrial fibrillation: Secondary | ICD-10-CM

## 2016-11-20 DIAGNOSIS — Z5111 Encounter for antineoplastic chemotherapy: Secondary | ICD-10-CM | POA: Diagnosis present

## 2016-11-20 DIAGNOSIS — G4733 Obstructive sleep apnea (adult) (pediatric): Secondary | ICD-10-CM

## 2016-11-20 DIAGNOSIS — C3492 Malignant neoplasm of unspecified part of left bronchus or lung: Secondary | ICD-10-CM

## 2016-11-20 DIAGNOSIS — Z7189 Other specified counseling: Secondary | ICD-10-CM

## 2016-11-20 LAB — CBC WITH DIFFERENTIAL/PLATELET
BASO%: 0.2 % (ref 0.0–2.0)
Basophils Absolute: 0 10*3/uL (ref 0.0–0.1)
EOS%: 0.6 % (ref 0.0–7.0)
Eosinophils Absolute: 0 10*3/uL (ref 0.0–0.5)
HCT: 37.6 % (ref 34.8–46.6)
HEMOGLOBIN: 12.4 g/dL (ref 11.6–15.9)
LYMPH%: 6.9 % — ABNORMAL LOW (ref 14.0–49.7)
MCH: 28.2 pg (ref 25.1–34.0)
MCHC: 33 g/dL (ref 31.5–36.0)
MCV: 85.4 fL (ref 79.5–101.0)
MONO#: 0.4 10*3/uL (ref 0.1–0.9)
MONO%: 12.3 % (ref 0.0–14.0)
NEUT%: 80 % — ABNORMAL HIGH (ref 38.4–76.8)
NEUTROS ABS: 2.3 10*3/uL (ref 1.5–6.5)
Platelets: 174 10*3/uL (ref 145–400)
RBC: 4.41 10*6/uL (ref 3.70–5.45)
RDW: 21.7 % — AB (ref 11.2–14.5)
WBC: 2.9 10*3/uL — AB (ref 3.9–10.3)
lymph#: 0.2 10*3/uL — ABNORMAL LOW (ref 0.9–3.3)

## 2016-11-20 LAB — COMPREHENSIVE METABOLIC PANEL
ALBUMIN: 3.5 g/dL (ref 3.5–5.0)
ALK PHOS: 101 U/L (ref 40–150)
ALT: 24 U/L (ref 0–55)
AST: 16 U/L (ref 5–34)
Anion Gap: 10 mEq/L (ref 3–11)
BILIRUBIN TOTAL: 0.31 mg/dL (ref 0.20–1.20)
BUN: 9.9 mg/dL (ref 7.0–26.0)
CO2: 25 mEq/L (ref 22–29)
Calcium: 9.8 mg/dL (ref 8.4–10.4)
Chloride: 105 mEq/L (ref 98–109)
Creatinine: 0.8 mg/dL (ref 0.6–1.1)
EGFR: 75 mL/min/{1.73_m2} — ABNORMAL LOW (ref >90)
GLUCOSE: 93 mg/dL (ref 70–140)
Potassium: 4.1 mEq/L (ref 3.5–5.1)
SODIUM: 141 meq/L (ref 136–145)
TOTAL PROTEIN: 7 g/dL (ref 6.4–8.3)

## 2016-11-20 MED ORDER — SODIUM CHLORIDE 0.9 % IV SOLN
259.2000 mg | Freq: Once | INTRAVENOUS | Status: AC
  Administered 2016-11-20: 260 mg via INTRAVENOUS
  Filled 2016-11-20: qty 26

## 2016-11-20 MED ORDER — FAMOTIDINE IN NACL 20-0.9 MG/50ML-% IV SOLN
20.0000 mg | Freq: Once | INTRAVENOUS | Status: AC
  Administered 2016-11-20: 20 mg via INTRAVENOUS

## 2016-11-20 MED ORDER — SODIUM CHLORIDE 0.9 % IV SOLN
Freq: Once | INTRAVENOUS | Status: AC
  Administered 2016-11-20: 13:00:00 via INTRAVENOUS
  Filled 2016-11-20: qty 5

## 2016-11-20 MED ORDER — DIPHENHYDRAMINE HCL 50 MG/ML IJ SOLN
50.0000 mg | Freq: Once | INTRAMUSCULAR | Status: AC
  Administered 2016-11-20: 50 mg via INTRAVENOUS

## 2016-11-20 MED ORDER — PACLITAXEL CHEMO INJECTION 300 MG/50ML
45.0000 mg/m2 | Freq: Once | INTRAVENOUS | Status: AC
  Administered 2016-11-20: 108 mg via INTRAVENOUS
  Filled 2016-11-20: qty 18

## 2016-11-20 MED ORDER — FAMOTIDINE IN NACL 20-0.9 MG/50ML-% IV SOLN
INTRAVENOUS | Status: AC
  Filled 2016-11-20: qty 50

## 2016-11-20 MED ORDER — DIPHENHYDRAMINE HCL 50 MG/ML IJ SOLN
INTRAMUSCULAR | Status: AC
  Filled 2016-11-20: qty 1

## 2016-11-20 MED ORDER — PALONOSETRON HCL INJECTION 0.25 MG/5ML
0.2500 mg | Freq: Once | INTRAVENOUS | Status: AC
  Administered 2016-11-20: 0.25 mg via INTRAVENOUS

## 2016-11-20 MED ORDER — PALONOSETRON HCL INJECTION 0.25 MG/5ML
INTRAVENOUS | Status: AC
  Filled 2016-11-20: qty 5

## 2016-11-20 MED ORDER — SODIUM CHLORIDE 0.9 % IV SOLN
Freq: Once | INTRAVENOUS | Status: AC
  Administered 2016-11-20: 13:00:00 via INTRAVENOUS

## 2016-11-21 DIAGNOSIS — C3432 Malignant neoplasm of lower lobe, left bronchus or lung: Secondary | ICD-10-CM | POA: Diagnosis not present

## 2016-11-22 DIAGNOSIS — C3432 Malignant neoplasm of lower lobe, left bronchus or lung: Secondary | ICD-10-CM | POA: Diagnosis not present

## 2016-11-23 ENCOUNTER — Ambulatory Visit (INDEPENDENT_AMBULATORY_CARE_PROVIDER_SITE_OTHER): Payer: Medicare Other

## 2016-11-23 DIAGNOSIS — C3432 Malignant neoplasm of lower lobe, left bronchus or lung: Secondary | ICD-10-CM | POA: Diagnosis not present

## 2016-11-23 DIAGNOSIS — Z7901 Long term (current) use of anticoagulants: Secondary | ICD-10-CM

## 2016-11-23 LAB — POCT INR: INR: 1.3

## 2016-11-23 NOTE — Patient Instructions (Signed)
Take 1 tablet by mouth on Wednesdays and Saturdays , and 1/2 tablet all other days

## 2016-11-24 ENCOUNTER — Other Ambulatory Visit: Payer: Self-pay

## 2016-11-24 DIAGNOSIS — C3492 Malignant neoplasm of unspecified part of left bronchus or lung: Secondary | ICD-10-CM

## 2016-11-24 DIAGNOSIS — C3432 Malignant neoplasm of lower lobe, left bronchus or lung: Secondary | ICD-10-CM | POA: Diagnosis not present

## 2016-11-26 ENCOUNTER — Other Ambulatory Visit: Payer: Self-pay | Admitting: Internal Medicine

## 2016-11-27 ENCOUNTER — Ambulatory Visit: Payer: Medicare Other | Admitting: Internal Medicine

## 2016-11-27 ENCOUNTER — Other Ambulatory Visit (HOSPITAL_BASED_OUTPATIENT_CLINIC_OR_DEPARTMENT_OTHER): Payer: Medicare Other

## 2016-11-27 ENCOUNTER — Ambulatory Visit (HOSPITAL_BASED_OUTPATIENT_CLINIC_OR_DEPARTMENT_OTHER): Payer: Medicare Other

## 2016-11-27 VITALS — BP 112/55 | HR 87 | Temp 98.3°F | Resp 18

## 2016-11-27 DIAGNOSIS — C3492 Malignant neoplasm of unspecified part of left bronchus or lung: Secondary | ICD-10-CM

## 2016-11-27 DIAGNOSIS — C3432 Malignant neoplasm of lower lobe, left bronchus or lung: Secondary | ICD-10-CM

## 2016-11-27 DIAGNOSIS — Z5111 Encounter for antineoplastic chemotherapy: Secondary | ICD-10-CM | POA: Diagnosis present

## 2016-11-27 LAB — CBC WITH DIFFERENTIAL/PLATELET
BASO%: 0.6 % (ref 0.0–2.0)
BASOS ABS: 0 10*3/uL (ref 0.0–0.1)
EOS ABS: 0 10*3/uL (ref 0.0–0.5)
EOS%: 0.8 % (ref 0.0–7.0)
HEMATOCRIT: 36.7 % (ref 34.8–46.6)
HGB: 12 g/dL (ref 11.6–15.9)
LYMPH#: 0.1 10*3/uL — AB (ref 0.9–3.3)
LYMPH%: 7.3 % — AB (ref 14.0–49.7)
MCH: 28.7 pg (ref 25.1–34.0)
MCHC: 32.8 g/dL (ref 31.5–36.0)
MCV: 87.6 fL (ref 79.5–101.0)
MONO#: 0.3 10*3/uL (ref 0.1–0.9)
MONO%: 15 % — ABNORMAL HIGH (ref 0.0–14.0)
NEUT%: 76.3 % (ref 38.4–76.8)
NEUTROS ABS: 1.5 10*3/uL (ref 1.5–6.5)
PLATELETS: 189 10*3/uL (ref 145–400)
RBC: 4.19 10*6/uL (ref 3.70–5.45)
RDW: 24.5 % — ABNORMAL HIGH (ref 11.2–14.5)
WBC: 2 10*3/uL — ABNORMAL LOW (ref 3.9–10.3)

## 2016-11-27 LAB — COMPREHENSIVE METABOLIC PANEL
ALT: 19 U/L (ref 0–55)
ANION GAP: 9 meq/L (ref 3–11)
AST: 19 U/L (ref 5–34)
Albumin: 3.2 g/dL — ABNORMAL LOW (ref 3.5–5.0)
Alkaline Phosphatase: 100 U/L (ref 40–150)
BUN: 8.3 mg/dL (ref 7.0–26.0)
CALCIUM: 9.3 mg/dL (ref 8.4–10.4)
CO2: 26 meq/L (ref 22–29)
CREATININE: 0.8 mg/dL (ref 0.6–1.1)
Chloride: 105 mEq/L (ref 98–109)
EGFR: 74 mL/min/{1.73_m2} — AB (ref >90)
Glucose: 114 mg/dl (ref 70–140)
Potassium: 4 mEq/L (ref 3.5–5.1)
Sodium: 141 mEq/L (ref 136–145)
TOTAL PROTEIN: 6.6 g/dL (ref 6.4–8.3)

## 2016-11-27 LAB — TECHNOLOGIST REVIEW

## 2016-11-27 MED ORDER — DIPHENHYDRAMINE HCL 50 MG/ML IJ SOLN
INTRAMUSCULAR | Status: AC
  Filled 2016-11-27: qty 1

## 2016-11-27 MED ORDER — SODIUM CHLORIDE 0.9 % IV SOLN
Freq: Once | INTRAVENOUS | Status: AC
  Administered 2016-11-27: 15:00:00 via INTRAVENOUS
  Filled 2016-11-27: qty 5

## 2016-11-27 MED ORDER — FAMOTIDINE IN NACL 20-0.9 MG/50ML-% IV SOLN
INTRAVENOUS | Status: AC
  Filled 2016-11-27: qty 50

## 2016-11-27 MED ORDER — SODIUM CHLORIDE 0.9 % IV SOLN
Freq: Once | INTRAVENOUS | Status: AC
  Administered 2016-11-27: 15:00:00 via INTRAVENOUS

## 2016-11-27 MED ORDER — PALONOSETRON HCL INJECTION 0.25 MG/5ML
0.2500 mg | Freq: Once | INTRAVENOUS | Status: AC
  Administered 2016-11-27: 0.25 mg via INTRAVENOUS

## 2016-11-27 MED ORDER — DIPHENHYDRAMINE HCL 50 MG/ML IJ SOLN
50.0000 mg | Freq: Once | INTRAMUSCULAR | Status: AC
Start: 2016-11-27 — End: 2016-11-27
  Administered 2016-11-27: 50 mg via INTRAVENOUS

## 2016-11-27 MED ORDER — SODIUM CHLORIDE 0.9 % IV SOLN
259.2000 mg | Freq: Once | INTRAVENOUS | Status: AC
  Administered 2016-11-27: 260 mg via INTRAVENOUS
  Filled 2016-11-27: qty 26

## 2016-11-27 MED ORDER — FAMOTIDINE IN NACL 20-0.9 MG/50ML-% IV SOLN
20.0000 mg | Freq: Once | INTRAVENOUS | Status: AC
  Administered 2016-11-27: 20 mg via INTRAVENOUS

## 2016-11-27 MED ORDER — PALONOSETRON HCL INJECTION 0.25 MG/5ML
INTRAVENOUS | Status: AC
  Filled 2016-11-27: qty 5

## 2016-11-27 MED ORDER — DEXTROSE 5 % IV SOLN
45.0000 mg/m2 | Freq: Once | INTRAVENOUS | Status: AC
  Administered 2016-11-27: 108 mg via INTRAVENOUS
  Filled 2016-11-27: qty 18

## 2016-11-27 NOTE — Patient Instructions (Signed)
[facility] Discharge Instructions for Patients Receiving Chemotherapy  Today you received the following chemotherapy agents: paclitaxel (Taxol) and Carboplatin   To help prevent nausea and vomiting after your treatment, we encourage you to take your nausea medication as directed.    If you develop nausea and vomiting that is not controlled by your nausea medication, call the clinic.   BELOW ARE SYMPTOMS THAT SHOULD BE REPORTED IMMEDIATELY:  *FEVER GREATER THAN 100.5 F  *CHILLS WITH OR WITHOUT FEVER  NAUSEA AND VOMITING THAT IS NOT CONTROLLED WITH YOUR NAUSEA MEDICATION  *UNUSUAL SHORTNESS OF BREATH  *UNUSUAL BRUISING OR BLEEDING  TENDERNESS IN MOUTH AND THROAT WITH OR WITHOUT PRESENCE OF ULCERS  *URINARY PROBLEMS  *BOWEL PROBLEMS  UNUSUAL RASH Items with * indicate a potential emergency and should be followed up as soon as possible.  Feel free to call the clinic you have any questions or concerns. The clinic phone number is [phone].  Please show the [place] at check-in to the Emergency Department and triage nurse.

## 2016-11-28 DIAGNOSIS — C3432 Malignant neoplasm of lower lobe, left bronchus or lung: Secondary | ICD-10-CM | POA: Diagnosis not present

## 2016-11-29 ENCOUNTER — Ambulatory Visit (HOSPITAL_BASED_OUTPATIENT_CLINIC_OR_DEPARTMENT_OTHER): Payer: Medicare Other | Admitting: Nurse Practitioner

## 2016-11-29 VITALS — BP 122/49 | HR 78 | Temp 98.1°F | Resp 18 | Ht 66.0 in | Wt 255.6 lb

## 2016-11-29 DIAGNOSIS — C3432 Malignant neoplasm of lower lobe, left bronchus or lung: Secondary | ICD-10-CM | POA: Diagnosis not present

## 2016-11-29 DIAGNOSIS — R1310 Dysphagia, unspecified: Secondary | ICD-10-CM | POA: Diagnosis not present

## 2016-11-29 DIAGNOSIS — C3492 Malignant neoplasm of unspecified part of left bronchus or lung: Secondary | ICD-10-CM

## 2016-11-29 MED ORDER — HYDROCODONE-ACETAMINOPHEN 7.5-325 MG PO TABS: ORAL_TABLET | ORAL | 0 refills | Status: DC

## 2016-11-29 NOTE — Progress Notes (Signed)
  [facility] OFFICE PROGRESS NOTE   DIAGNOSIS: Unresectable stage IIIA(T4, N0, M0) non-small cell lung cancer, squamous cell carcinoma diagnosed in May 2018 and presented with large obstructing left lower lobe lung mass suspicious small mediastinal lymphadenopathy.  PRIOR THERAPY: None.  CURRENT THERAPY: A course of concurrent chemoradiation with weekly carboplatin for AUC of 2 and paclitaxel 45 MG/M2. Status post 8 cycles. She received her radiation in [facility].    INTERVAL HISTORY:   Ms. Bloomfield returns as scheduled. She completed cycle 8 weekly carboplatin/Taxol 11/27/2016. She will complete the course of radiation tomorrow. She denies significant nausea following chemotherapy earlier this week. No mouth sores. No diarrhea. She has odynophagia. She takes Vicodin as needed. No numbness or tingling in her hands or feet. She reports less shortness of breath. Some cough. No fever.  Objective:  Vital signs in last 24 hours:  Blood pressure (!) 122/49, pulse 78, temperature 98.1 F (36.7 C), temperature source Oral, resp. rate 18, height 5\' 6"  (1.676 m), weight 255 lb 9.6 oz (115.9 kg), SpO2 97 %.    HEENT: No thrush or ulcers. Resp: Lungs clear bilaterally. Cardio: Regular rate and rhythm. GI: Abdomen soft and nontender. No hepatomegaly. Vascular: Chronic edema/venous stasis change left lower leg. Neuro: Alert and oriented.    Lab Results:  Lab Results  Component Value Date   WBC 2.0 (L) 11/27/2016   HGB 12.0 11/27/2016   HCT 36.7 11/27/2016   MCV 87.6 11/27/2016   PLT 189 11/27/2016   NEUTROABS 1.5 11/27/2016    Imaging:  No results found.  Medications: I have reviewed the patient's current medications.  Assessment/Plan: 1. Unresectable stage IIIa non-small cell lung cancer. Currently undergoing a course of concurrent chemoradiation with weekly carboplatin and paclitaxel status post 8 cycles, radiation scheduled to be completed  11/30/2016. 2. Odynophagia secondary to radiation. She continues Carafate and takes hydrocodone as needed.   Disposition: Ms. Bonneau appears stable. She completed cycle 8 weekly carboplatin/Taxol 11/27/2016. She is scheduled to complete the course of radiation tomorrow. Dr. Julien Nordmann recommends a restaging chest CT in approximately 3 weeks. She would like to have the chest CT done at [facility] in [place] due to the close proximity to her home. She will have the chest CT around 12/20/2016 and return for a follow-up visit a few days later to review the results.  She has odynophagia related to radiation. She was provided with a new hydrocodone prescription.  She will contact the office prior to her next visit with any problems.  Plan reviewed with Dr. Julien Nordmann.    Ned Card ANP/GNP-BC   11/29/2016  2:48 PM

## 2016-11-30 ENCOUNTER — Telehealth: Payer: Self-pay | Admitting: Internal Medicine

## 2016-11-30 DIAGNOSIS — C3432 Malignant neoplasm of lower lobe, left bronchus or lung: Secondary | ICD-10-CM | POA: Diagnosis not present

## 2016-11-30 NOTE — Telephone Encounter (Signed)
Spoke with patient and informed them on their appointments in August.

## 2016-11-30 NOTE — Telephone Encounter (Signed)
Spoke with patient's husband regarding her upcoming appointments.

## 2016-12-07 ENCOUNTER — Ambulatory Visit (INDEPENDENT_AMBULATORY_CARE_PROVIDER_SITE_OTHER): Payer: Medicare Other | Admitting: Nurse Practitioner

## 2016-12-07 ENCOUNTER — Telehealth: Payer: Self-pay | Admitting: *Deleted

## 2016-12-07 ENCOUNTER — Encounter: Payer: Self-pay | Admitting: Nurse Practitioner

## 2016-12-07 ENCOUNTER — Ambulatory Visit: Payer: Medicare Other | Admitting: *Deleted

## 2016-12-07 ENCOUNTER — Telehealth: Payer: Self-pay | Admitting: Family Medicine

## 2016-12-07 VITALS — BP 110/64 | Temp 98.1°F | Ht 67.0 in | Wt 250.2 lb

## 2016-12-07 DIAGNOSIS — R309 Painful micturition, unspecified: Secondary | ICD-10-CM | POA: Diagnosis not present

## 2016-12-07 DIAGNOSIS — R509 Fever, unspecified: Secondary | ICD-10-CM | POA: Diagnosis not present

## 2016-12-07 DIAGNOSIS — Z7901 Long term (current) use of anticoagulants: Secondary | ICD-10-CM

## 2016-12-07 LAB — POCT URINALYSIS DIPSTICK
Spec Grav, UA: >=1.03 — AB (ref 1.010–1.025)
pH, UA: 5 (ref 5.0–8.0)

## 2016-12-07 LAB — POCT UA - MICROSCOPIC ONLY

## 2016-12-07 LAB — POCT INR: INR: 1.9

## 2016-12-07 MED ORDER — CEFDINIR 300 MG PO CAPS: 300.0000 mg | ORAL_CAPSULE | Freq: Two times a day (BID) | ORAL | 0 refills | Status: DC

## 2016-12-07 NOTE — Telephone Encounter (Signed)
Received VM from pt @0915  stating that pt had fever last night of 101.5. This morning it is 99.5.  She states she has a headache, clear nasal discharge and that she experiences burning when she urinates. Pt had chemo 10 days ago. Advised pt that she may need to come in to give [place] urine specimen to check for UTI. Pt states she is going to her PCP this am for PT/INR and could she give that office/lab a urine specimen. Pt lives an hour away. Advised pt that this was ok but that her PCP office would need to follow up on that. She said she would tell them that.  Advised pt to let us know the results and treatment her pcp prescribes if any.  She said she would.

## 2016-12-07 NOTE — Telephone Encounter (Signed)
Pt dropped off handicap placard form. Form in nurse box.

## 2016-12-07 NOTE — Progress Notes (Signed)
Subjective:  Presents for c/o low grade fever for the past few days. Max temp 101.5. Completed chemotherapy about 10 days ago and radiation about 7 days ago. The only issue she has at the moment is slight dysuria. No urgency or frequency. No pelvic pain. No back or flank pain. No recent UTI. No vaginal discharge. Same sexual partner. Not taking fluids as well due to sore throat related to radiation although this has improved.  Objective:   BP 110/64   Temp 98.1 F (36.7 C) (Oral)   Ht 5\' 7"  (1.702 m)   Wt 250 lb 4 oz (113.5 kg)   BMI 39.19 kg/m  NAD. Alert, oriented. Lungs clear. Heart RRR. No CVA tenderness. Abdomen obese, soft, non distended with no pelvic area tenderness.  Results for orders placed or performed in visit on 12/07/16  POCT urinalysis dipstick  Result Value Ref Range   Color, UA     Clarity, UA     Glucose, UA     Bilirubin, UA     Ketones, UA     Spec Grav, UA >=1.030 (A) 1.010 - 1.025   Blood, UA     pH, UA 5.0 5.0 - 8.0   Protein, UA     Urobilinogen, UA  0.2 or 1.0 E.U./dL   Nitrite, UA     Leukocytes, UA  Negative  POCT INR  Result Value Ref Range   INR 1.9   POCT UA - Microscopic Only  Result Value Ref Range   WBC, Ur, HPF, POC occas    RBC, urine, microscopic rare    Bacteria, U Microscopic moderate    Mucus, UA     Epithelial cells, urine per micros many    Crystals, Ur, HPF, POC     Casts, Ur, LPF, POC     Yeast, UA       Assessment:   Problem List Items Addressed This Visit      Other   Long term current use of anticoagulant therapy    Other Visit Diagnoses    Painful urination    -  Primary   Relevant Orders   POCT urinalysis dipstick (Completed)   Urine Culture   POCT UA - Microscopic Only (Completed)   Long term (current) use of anticoagulants       Relevant Orders   POCT INR (Completed)   Febrile illness           Plan:   Meds ordered this encounter  Medications  . cefdinir (OMNICEF) 300 MG capsule    Sig: Take 1 capsule  (300 mg total) by mouth 2 (two) times daily.    Dispense:  14 capsule    Refill:  0    Order Specific Question:   Supervising Provider    Answer:   [name] [[id number]]   Start Omnicef to cover for possible UTI. Increase fluid intake. Monitor temp. Call back in 4 days if fever persists. Call or go to ED sooner if worse. Call tomorrow if needed. Patient spoke with oncology and they had planned a urine test based on her symptoms. Plan further work up if fever persists.

## 2016-12-09 LAB — URINE CULTURE

## 2016-12-11 ENCOUNTER — Telehealth: Payer: Self-pay | Admitting: Family Medicine

## 2016-12-11 ENCOUNTER — Telehealth: Payer: Self-pay | Admitting: Medical Oncology

## 2016-12-11 NOTE — Telephone Encounter (Signed)
Saw NP on Thursday -on cefdinir for UTI which she started Thursday . Pt reports that she still has low grade fever and nausea and had " terrible diarrhea x 1 episode".  I instructed pt to contact PCP with these symptoms

## 2016-12-11 NOTE — Telephone Encounter (Signed)
The urine culture was negative, so not appropriate for her to switch antibiotics. She needs further work up for fever. See Richardson Landry tomorrow. Go to ED tonight if worse.

## 2016-12-11 NOTE — Telephone Encounter (Signed)
Pt is requesting  A nurse to call her back regarding the uti that she was seen for last Thursday.

## 2016-12-11 NOTE — Telephone Encounter (Signed)
Patient advised The urine culture was negative, so not appropriate for her to switch antibiotics. She needs further work up for fever. See Richardson Landry tomorrow. Go to ED tonight if worse.  Patient stated she will call her oncologist tomorrow for their recommendations for further work up-she is currently on chemo and radiation

## 2016-12-11 NOTE — Telephone Encounter (Signed)
Patient states the fever has never completely broken but has gone down and she has been very nauseated. The cancer center nurse wanted her to call us to check the culture and see if we thought she needed a stronger antibiotic.

## 2016-12-12 ENCOUNTER — Encounter: Payer: Self-pay | Admitting: Family Medicine

## 2016-12-12 ENCOUNTER — Ambulatory Visit (INDEPENDENT_AMBULATORY_CARE_PROVIDER_SITE_OTHER): Payer: Medicare Other | Admitting: Family Medicine

## 2016-12-12 VITALS — BP 112/44 | Temp 98.8°F | Ht 67.0 in | Wt 251.4 lb

## 2016-12-12 DIAGNOSIS — R509 Fever, unspecified: Secondary | ICD-10-CM

## 2016-12-12 MED ORDER — PROMETHAZINE HCL 25 MG PO TABS: 25.0000 mg | ORAL_TABLET | Freq: Four times a day (QID) | ORAL | 0 refills | Status: DC | PRN

## 2016-12-12 NOTE — Progress Notes (Signed)
   Subjective:    Patient ID: Sarah [name], female    DOB: January 14, 1951, 66 y.o.   MRN: [id number]  Fever   This is a new problem. The current episode started in the past 7 days. The temperature was taken using an oral thermometer. Associated symptoms include coughing. Associated symptoms comments: Runny nose . She has tried acetaminophen (antibiotics ) for the symptoms.   States no other concerns this visit.    Still experiencing symot post rad and chemo,  Patient's white blood count was low on the last CBC drawn a couple weeks ago.  Cough is slightly productive. No substantial shortness of breath    Thirty rad rx and eight   Review of Systems  Constitutional: Positive for fever.  Respiratory: Positive for cough.        Objective:   Physical Exam  Alert talkative no acute distress. Currently afebrile lungs no inspiratory crackles no wheezes no tachypnea heart regular in rhythm.      Assessment & Plan:  Impression febrile illness with cough and known lung cancer with recent radiation and recent low white blood count plan due to nontoxicity recommend finishing antibiotic. Will check CBC to look at white blood count addendum white blood count came back significantly higher at 3.6 with proximally 70% neutrophils

## 2016-12-13 LAB — BASIC METABOLIC PANEL
BUN / CREAT RATIO: 9 — AB (ref 12–28)
BUN: 7 mg/dL — AB (ref 8–27)
CO2: 27 mmol/L (ref 20–29)
CREATININE: 0.76 mg/dL (ref 0.57–1.00)
Calcium: 9.1 mg/dL (ref 8.7–10.3)
Chloride: 98 mmol/L (ref 96–106)
GFR calc Af Amer: 95 mL/min/{1.73_m2} (ref >59)
GFR calc non Af Amer: 83 mL/min/{1.73_m2} (ref >59)
Glucose: 87 mg/dL (ref 65–99)
Potassium: 3.9 mmol/L (ref 3.5–5.2)
Sodium: 142 mmol/L (ref 134–144)

## 2016-12-13 LAB — HEPATIC FUNCTION PANEL
ALK PHOS: 113 IU/L (ref 39–117)
ALT: 21 IU/L (ref 0–32)
AST: 21 IU/L (ref 0–40)
Albumin: 3.7 g/dL (ref 3.6–4.8)
BILIRUBIN, DIRECT: 0.09 mg/dL (ref 0.00–0.40)
Bilirubin Total: 0.2 mg/dL (ref 0.0–1.2)
Total Protein: 6.4 g/dL (ref 6.0–8.5)

## 2016-12-13 LAB — CBC WITH DIFFERENTIAL/PLATELET
Basophils Absolute: 0.1 10*3/uL (ref 0.0–0.2)
Basos: 2 %
EOS (ABSOLUTE): 0.1 10*3/uL (ref 0.0–0.4)
Eos: 3 %
HEMOGLOBIN: 11.4 g/dL (ref 11.1–15.9)
Hematocrit: 34.1 % (ref 34.0–46.6)
LYMPHS: 10 %
Lymphocytes Absolute: 0.3 10*3/uL — ABNORMAL LOW (ref 0.7–3.1)
MCH: 29.4 pg (ref 26.6–33.0)
MCHC: 33.4 g/dL (ref 31.5–35.7)
MCV: 88 fL (ref 79–97)
MONOS ABS: 0.2 10*3/uL (ref 0.1–0.9)
Monocytes: 7 %
NEUTROS PCT: 76 %
Neutrophils Absolute: 2.6 10*3/uL (ref 1.4–7.0)
PLATELETS: 180 10*3/uL (ref 150–379)
RBC: 3.88 x10E6/uL (ref 3.77–5.28)
RDW: 24.7 % — ABNORMAL HIGH (ref 12.3–15.4)
WBC: 3.4 10*3/uL (ref 3.4–10.8)

## 2016-12-13 LAB — IMMATURE CELLS: METAMYELOCYTES: 2 % — AB (ref 0–0)

## 2016-12-15 ENCOUNTER — Ambulatory Visit (INDEPENDENT_AMBULATORY_CARE_PROVIDER_SITE_OTHER): Payer: Medicare Other

## 2016-12-15 ENCOUNTER — Telehealth: Payer: Self-pay

## 2016-12-15 DIAGNOSIS — Z7901 Long term (current) use of anticoagulants: Secondary | ICD-10-CM | POA: Diagnosis not present

## 2016-12-15 LAB — POCT INR: INR: 2.1

## 2016-12-15 NOTE — Telephone Encounter (Signed)
Pt called that she has been having a fever for a week. It keeps going up and down. She saw her PCP and ua was clean but put on antibiotic in case. Then saw PCP again on Tuesday 8/14 and labs were good.  She took her last antibiotic was yesterday. She is getting steadily weaker. Highest 100.5 it gets back to normal but goes right back up. She is using tylenol. She has been SOB in last 3-4 day, walk from chair to bedroom and get SOB. Some cough sounds a little junky, sputum is clear.  she has been nauseated for last 48 hours, phenergan does help.   7/30 taxol carbo 8/21 CT 8/28 lab md  S/w Dr Benay Spice and instructed pt to push fluids and continue using tylenol. Go to ER if gets worse, with higher fever, increased cough or SOB.  Possible DDX of tumor fever or radiation pneumonitis explained to pt.

## 2016-12-18 ENCOUNTER — Telehealth: Payer: Self-pay | Admitting: *Deleted

## 2016-12-18 ENCOUNTER — Encounter (HOSPITAL_COMMUNITY): Payer: Self-pay | Admitting: Emergency Medicine

## 2016-12-18 ENCOUNTER — Inpatient Hospital Stay (HOSPITAL_COMMUNITY)
Admission: EM | Admit: 2016-12-18 | Discharge: 2016-12-20 | DRG: 194 | Disposition: A | Discharge status: Home / Self Care | Payer: Medicare Other | Attending: Internal Medicine | Admitting: Internal Medicine

## 2016-12-18 ENCOUNTER — Emergency Department (HOSPITAL_COMMUNITY): Payer: Medicare Other

## 2016-12-18 DIAGNOSIS — C3492 Malignant neoplasm of unspecified part of left bronchus or lung: Secondary | ICD-10-CM | POA: Diagnosis present

## 2016-12-18 DIAGNOSIS — Z86718 Personal history of other venous thrombosis and embolism: Secondary | ICD-10-CM

## 2016-12-18 DIAGNOSIS — Z79899 Other long term (current) drug therapy: Secondary | ICD-10-CM

## 2016-12-18 DIAGNOSIS — J189 Pneumonia, unspecified organism: Secondary | ICD-10-CM | POA: Diagnosis present

## 2016-12-18 DIAGNOSIS — Y95 Nosocomial condition: Secondary | ICD-10-CM | POA: Diagnosis present

## 2016-12-18 DIAGNOSIS — Z7901 Long term (current) use of anticoagulants: Secondary | ICD-10-CM

## 2016-12-18 DIAGNOSIS — I4891 Unspecified atrial fibrillation: Secondary | ICD-10-CM | POA: Diagnosis present

## 2016-12-18 DIAGNOSIS — Z886 Allergy status to analgesic agent status: Secondary | ICD-10-CM

## 2016-12-18 DIAGNOSIS — Z6839 Body mass index (BMI) 39.0-39.9, adult: Secondary | ICD-10-CM

## 2016-12-18 DIAGNOSIS — Z87891 Personal history of nicotine dependence: Secondary | ICD-10-CM | POA: Diagnosis not present

## 2016-12-18 DIAGNOSIS — G4730 Sleep apnea, unspecified: Secondary | ICD-10-CM | POA: Diagnosis present

## 2016-12-18 DIAGNOSIS — Z9221 Personal history of antineoplastic chemotherapy: Secondary | ICD-10-CM

## 2016-12-18 DIAGNOSIS — E669 Obesity, unspecified: Secondary | ICD-10-CM | POA: Diagnosis present

## 2016-12-18 DIAGNOSIS — K219 Gastro-esophageal reflux disease without esophagitis: Secondary | ICD-10-CM | POA: Diagnosis not present

## 2016-12-18 DIAGNOSIS — R0602 Shortness of breath: Secondary | ICD-10-CM | POA: Diagnosis not present

## 2016-12-18 DIAGNOSIS — I482 Chronic atrial fibrillation, unspecified: Secondary | ICD-10-CM | POA: Diagnosis present

## 2016-12-18 DIAGNOSIS — R1310 Dysphagia, unspecified: Secondary | ICD-10-CM

## 2016-12-18 DIAGNOSIS — G4733 Obstructive sleep apnea (adult) (pediatric): Secondary | ICD-10-CM | POA: Diagnosis present

## 2016-12-18 LAB — COMPREHENSIVE METABOLIC PANEL
ALBUMIN: 3.2 g/dL — AB (ref 3.5–5.0)
ALK PHOS: 106 U/L (ref 38–126)
ALT: 18 U/L (ref 14–54)
AST: 24 U/L (ref 15–41)
Anion gap: 10 (ref 5–15)
BILIRUBIN TOTAL: 0.3 mg/dL (ref 0.3–1.2)
BUN: 8 mg/dL (ref 6–20)
CALCIUM: 9.1 mg/dL (ref 8.9–10.3)
CO2: 30 mmol/L (ref 22–32)
CREATININE: 0.8 mg/dL (ref 0.44–1.00)
Chloride: 98 mmol/L — ABNORMAL LOW (ref 101–111)
GFR calc Af Amer: >60 mL/min (ref >60)
GFR calc non Af Amer: >60 mL/min (ref >60)
GLUCOSE: 119 mg/dL — AB (ref 65–99)
Potassium: 3.4 mmol/L — ABNORMAL LOW (ref 3.5–5.1)
Sodium: 138 mmol/L (ref 135–145)
TOTAL PROTEIN: 7.5 g/dL (ref 6.5–8.1)

## 2016-12-18 LAB — CBC WITH DIFFERENTIAL/PLATELET
BASOS ABS: 0 10*3/uL (ref 0.0–0.1)
BASOS PCT: 0 %
Eosinophils Absolute: 0.1 10*3/uL (ref 0.0–0.7)
Eosinophils Relative: 2 %
HEMATOCRIT: 35.7 % — AB (ref 36.0–46.0)
HEMOGLOBIN: 11.3 g/dL — AB (ref 12.0–15.0)
Lymphocytes Relative: 11 %
Lymphs Abs: 0.7 10*3/uL (ref 0.7–4.0)
MCH: 29 pg (ref 26.0–34.0)
MCHC: 31.7 g/dL (ref 30.0–36.0)
MCV: 91.8 fL (ref 78.0–100.0)
MONOS PCT: 10 %
Monocytes Absolute: 0.6 10*3/uL (ref 0.1–1.0)
NEUTROS ABS: 4.7 10*3/uL (ref 1.7–7.7)
NEUTROS PCT: 77 %
Platelets: 185 10*3/uL (ref 150–400)
RBC: 3.89 MIL/uL (ref 3.87–5.11)
RDW: 25 % — ABNORMAL HIGH (ref 11.5–15.5)
WBC: 6.2 10*3/uL (ref 4.0–10.5)

## 2016-12-18 LAB — I-STAT CG4 LACTIC ACID, ED: Lactic Acid, Venous: 1.69 mmol/L (ref 0.5–1.9)

## 2016-12-18 LAB — PROTIME-INR
INR: 2.45
Prothrombin Time: 27 seconds — ABNORMAL HIGH (ref 11.4–15.2)

## 2016-12-18 MED ORDER — HYDROCODONE-ACETAMINOPHEN 7.5-325 MG PO TABS: 1.0000 | ORAL_TABLET | ORAL | Status: DC | PRN

## 2016-12-18 MED ORDER — PANTOPRAZOLE SODIUM 40 MG PO TBEC
40.0000 mg | DELAYED_RELEASE_TABLET | Freq: Every morning | ORAL | Status: DC
  Administered 2016-12-19 – 2016-12-20 (×2): 40 mg via ORAL
  Filled 2016-12-18 (×2): qty 1

## 2016-12-18 MED ORDER — PIPERACILLIN-TAZOBACTAM 3.375 G IVPB
3.3750 g | Freq: Once | INTRAVENOUS | Status: AC
  Administered 2016-12-19: 3.375 g via INTRAVENOUS
  Filled 2016-12-18: qty 50

## 2016-12-18 MED ORDER — SODIUM CHLORIDE 0.9 % IV BOLUS (SEPSIS)
500.0000 mL | Freq: Once | INTRAVENOUS | Status: AC
  Administered 2016-12-18: 500 mL via INTRAVENOUS

## 2016-12-18 MED ORDER — FLECAINIDE ACETATE 50 MG PO TABS
50.0000 mg | ORAL_TABLET | Freq: Two times a day (BID) | ORAL | Status: DC
  Administered 2016-12-19 – 2016-12-20 (×4): 50 mg via ORAL
  Filled 2016-12-18 (×8): qty 1

## 2016-12-18 MED ORDER — ONDANSETRON HCL 4 MG/2ML IJ SOLN
4.0000 mg | Freq: Four times a day (QID) | INTRAMUSCULAR | Status: DC | PRN
  Administered 2016-12-19 – 2016-12-20 (×3): 4 mg via INTRAVENOUS
  Filled 2016-12-18 (×3): qty 2

## 2016-12-18 MED ORDER — SODIUM CHLORIDE 0.9% FLUSH
3.0000 mL | Freq: Two times a day (BID) | INTRAVENOUS | Status: DC
  Administered 2016-12-19 – 2016-12-20 (×4): 3 mL via INTRAVENOUS

## 2016-12-18 MED ORDER — PIPERACILLIN-TAZOBACTAM 3.375 G IVPB 30 MIN
3.3750 g | Freq: Once | INTRAVENOUS | Status: AC
  Administered 2016-12-18: 3.375 g via INTRAVENOUS
  Filled 2016-12-18: qty 50

## 2016-12-18 MED ORDER — VANCOMYCIN HCL IN DEXTROSE 1-5 GM/200ML-% IV SOLN
1000.0000 mg | Freq: Once | INTRAVENOUS | Status: AC
  Administered 2016-12-19: 1000 mg via INTRAVENOUS
  Filled 2016-12-18: qty 200

## 2016-12-18 MED ORDER — VANCOMYCIN HCL IN DEXTROSE 1-5 GM/200ML-% IV SOLN
1000.0000 mg | Freq: Once | INTRAVENOUS | Status: AC
  Administered 2016-12-18: 1000 mg via INTRAVENOUS
  Filled 2016-12-18: qty 200

## 2016-12-18 MED ORDER — DILTIAZEM HCL ER COATED BEADS 180 MG PO CP24
180.0000 mg | ORAL_CAPSULE | Freq: Every day | ORAL | Status: DC
  Administered 2016-12-19 – 2016-12-20 (×2): 180 mg via ORAL
  Filled 2016-12-18 (×2): qty 1

## 2016-12-18 MED ORDER — ONDANSETRON HCL 4 MG PO TABS: 4.0000 mg | ORAL_TABLET | Freq: Four times a day (QID) | ORAL | Status: DC | PRN

## 2016-12-18 MED ORDER — CALCIUM CARBONATE ANTACID 500 MG PO CHEW: 1.0000 | CHEWABLE_TABLET | ORAL | Status: DC

## 2016-12-18 MED ORDER — BUPROPION HCL ER (SR) 150 MG PO TB12
150.0000 mg | ORAL_TABLET | Freq: Every day | ORAL | Status: DC
  Administered 2016-12-19 – 2016-12-20 (×2): 150 mg via ORAL
  Filled 2016-12-18 (×2): qty 1

## 2016-12-18 MED ORDER — ACETAMINOPHEN 325 MG PO TABS
650.0000 mg | ORAL_TABLET | ORAL | Status: DC | PRN
  Administered 2016-12-18 – 2016-12-20 (×2): 650 mg via ORAL
  Filled 2016-12-18 (×2): qty 2

## 2016-12-18 NOTE — ED Notes (Signed)
Attempted to give reportx1 

## 2016-12-18 NOTE — Progress Notes (Signed)
ANTIBIOTIC CONSULT NOTE-Preliminary  Pharmacy Consult for VAncomycin and Zosyn Indication: Pneumonia  Allergies  Allergen Reactions  . Aspirin Nausea And Vomiting    Patient Measurements: Height: 5\' 7"  (170.2 cm) Weight: 250 lb (113.4 kg) IBW/kg (Calculated) : 61.6   Vital Signs: Temp: 98.4 F (36.9 C) (08/20 2303) Temp Source: Oral (08/20 2303) BP: 110/50 (08/20 2303) Pulse Rate: 97 (08/20 2303)  Labs:  Recent Labs  12/18/16 1808  WBC 6.2  HGB 11.3*  PLT 185  CREATININE 0.80    Estimated Creatinine Clearance: 91.1 mL/min (by C-G formula based on SCr of 0.8 mg/dL).  No results for input(s): VANCOTROUGH, VANCOPEAK, VANCORANDOM, GENTTROUGH, GENTPEAK, GENTRANDOM, TOBRATROUGH, TOBRAPEAK, TOBRARND, AMIKACINPEAK, AMIKACINTROU, AMIKACIN in the last 72 hours.   Microbiology: Recent Results (from the past 720 hour(s))  TECHNOLOGIST REVIEW     Status: None   Collection Time: 11/27/16 12:44 PM  Result Value Ref Range Status   Technologist Review rare myelocyte  Final  Urine Culture     Status: None   Collection Time: 12/07/16 12:00 AM  Result Value Ref Range Status   Urine Culture, Routine Final report  Final   Organism ID, Bacteria Comment  Final    Comment: Culture shows less than 10,000 colony forming units of bacteria per milliliter of urine. This colony count is not generally considered to be clinically significant.   Culture, blood (Routine X 2) w Reflex to ID Panel     Status: None (Preliminary result)   Collection Time: 12/18/16  6:08 PM  Result Value Ref Range Status   Specimen Description LEFT ANTECUBITAL  Final   Special Requests   Final    BOTTLES DRAWN AEROBIC AND ANAEROBIC Blood Culture adequate volume   Culture PENDING  Incomplete   Report Status PENDING  Incomplete  Culture, blood (Routine X 2) w Reflex to ID Panel     Status: None (Preliminary result)   Collection Time: 12/18/16  6:08 PM  Result Value Ref Range Status   Specimen Description RIGHT  ANTECUBITAL  Final   Special Requests   Final    BOTTLES DRAWN AEROBIC ONLY Blood Culture adequate volume   Culture PENDING  Incomplete   Report Status PENDING  Incomplete    Medical History: Past Medical History:  Diagnosis Date  . Arthritis   . Cancer ([place])    lung cancer  . Chronic anxiety    Patient describes as stress  . Chronic back pain   . DVT (deep venous thrombosis) ([place]) age 3  . GERD (gastroesophageal reflux disease)   . Goals of care, counseling/discussion 09/21/2016  . Headache(784.0)   . Hypoestrogenism   . Lower extremity venous stasis    LLE, chronic  . Obesity   . Odynophagia 11/13/2016  . Paroxysmal atrial fibrillation (H[facility])   . PONV (postoperative nausea and vomiting)   . Pulmonary embolism ([place])   . Reflux   . Sleep apnea    wear CPAP  . Spinal stenosis   . Tobacco abuse   . Venous stasis   . Warfarin anticoagulation     Medications:  Vancomycin 1000 mg IV given in the ED Zosyn 3.375 Gm IV given in the ED  Assessment: 66 yo female undergoing chemotherapy for SCC of lung seen in the ED for elevated temp, cough and SOB. Outpatient Tx w/ Po Omnicef completed without resolution of symptoms.Possible Infiltrates seen on chest xray. Pt to be admitted for HCAP on empiric antibiotics.  Goal of Therapy:  Vancomycin troughs 15-20  mcg/ml Eradicated infection  Plan:  Preliminary review of pertinent patient information completed.  Protocol will be initiated with doses of Vancomycin 1000 mg in addition to the initial ED dose for a total loading dose of 2000 mg, and Zosyn 3.375 Gm IV @8  hours after the initial ED dose. Sarah [name] clinical pharmacist will complete review during morning rounds to assess patient and finalize treatment regimen if needed.  Sarah [name], [facility] 12/18/2016,11:48 PM

## 2016-12-18 NOTE — H&P (Signed)
History and Physical    STEPAHNIE CAMPO NFA:[id number] DOB: 1950-11-03 DOA: 12/18/2016  PCP: [name], MD  Patient coming from: Home.    Chief Complaint:   Fever, coughs, and SOB>   HPI: Sarah [name] is an 66 y.o. female with hx of SCC of the lung, undergoing chemotherapy with Dr Earlie Server, hx of afib and DVT on therapeutic coumadin, INR 2.45, GERD, sleep apnea with CPAP compliant, spinal stenosis, obesity, hx of odynophagia but has improved, presents to the ER with temp 101.4 at home, coughs, and mild SOB.  She was empirically placed on omicef.  Evaluation in the ER included a normal lactic acid, no leukocytosis, normal renal fx tests and CXR showed developing infiltrate.  She was admitted in June for nausea vomiting.  She was started on IV Van/Zosyn, BC were obtained, given supplemental oxygen with good Sat, and hospitalist was asked to admit her for HCAP.   ED Course:  See above.  Rewiew of Systems:  Constitutional: Negative for malaise, fever and chills. No significant weight loss or weight gain Eyes: Negative for eye pain, redness and discharge, diplopia, visual changes, or flashes of light. ENMT: Negative for ear pain, hoarseness, nasal congestion, sinus pressure and sore throat. No headaches; tinnitus, drooling, or problem swallowing. Cardiovascular: Negative for chest pain, palpitations, diaphoresis, dyspnea and peripheral edema. ; No orthopnea, PND Respiratory: Negative for hemoptysis, wheezing and stridor. No pleuritic chestpain. Gastrointestinal: Negative for diarrhea, constipation,  melena, blood in stool, hematemesis, jaundice and rectal bleeding.    Genitourinary: Negative for frequency, dysuria, incontinence,flank pain and hematuria; Musculoskeletal: Negative for back pain and neck pain. Negative for swelling and trauma.;  Skin: . Negative for pruritus, rash, abrasions, bruising and skin lesion.; ulcerations Neuro: Negative for headache, lightheadedness and neck  stiffness. Negative for weakness, altered level of consciousness , altered mental status, extremity weakness, burning feet, involuntary movement, seizure and syncope.  Psych: negative for anxiety, depression, insomnia, tearfulness, panic attacks, hallucinations, paranoia, suicidal or homicidal ideation    Past Medical History:  Diagnosis Date  . Arthritis   . Cancer ([place])    lung cancer  . Chronic anxiety    Patient describes as stress  . Chronic back pain   . DVT (deep venous thrombosis) ([place]) age 81  . GERD (gastroesophageal reflux disease)   . Goals of care, counseling/discussion 09/21/2016  . Headache(784.0)   . Hypoestrogenism   . Lower extremity venous stasis    LLE, chronic  . Obesity   . Odynophagia 11/13/2016  . Paroxysmal atrial fibrillation ([facility])   . PONV (postoperative nausea and vomiting)   . Pulmonary embolism ([place])   . Reflux   . Sleep apnea    wear CPAP  . Spinal stenosis   . Tobacco abuse   . Venous stasis   . Warfarin anticoagulation     Past Surgical History:  Procedure Laterality Date  . ANKLE SURGERY     x2  . APPENDECTOMY    . CARPAL TUNNEL RELEASE    . CHOLECYSTECTOMY    . OVARIAN CYST REMOVAL    . ROTATOR CUFF REPAIR    . TUBAL LIGATION    . VIDEO BRONCHOSCOPY WITH ENDOBRONCHIAL ULTRASOUND N/A 09/14/2016   Procedure: VIDEO BRONCHOSCOPY WITH ENDOBRONCHIAL ULTRASOUND;  Surgeon: Melrose Nakayama, MD;  Location: [facility];  Service: Thoracic;  Laterality: N/A;     reports that she quit smoking about 3 months ago. Her smoking use included Cigarettes. She has a 40.00 pack-year smoking history.  She has never used smokeless tobacco. She reports that she does not drink alcohol or use drugs.  Allergies  Allergen Reactions  . Aspirin Nausea And Vomiting    Family History  Problem Relation Age of Onset  . Arrhythmia Mother   . Cancer Mother        colon  . Heart disease Mother   . Stroke Father        Deceased     Prior to Admission  medications   Medication Sig Start Date End Date Taking? Authorizing Provider  buPROPion (WELLBUTRIN SR) 150 MG 12 hr tablet TAKE 1 TABLET BY MOUTH TWICE DAILY **KEEP APPT ON 06/09/16** Patient taking differently: Take 150 mg by mouth daily. TAKE 1 TABLET BY MOUTH TWICE DAILY **KEEP APPT ON 06/09/16** 11/06/16  Yes [name], MD  calcium carbonate (TUMS - DOSED IN MG ELEMENTAL CALCIUM) 500 MG chewable tablet Chew 1 tablet by mouth 2 (two) times a week.    Yes [provider]  diltiazem (CARDIZEM CD) 180 MG 24 hr capsule TAKE ONE CAPSULE BY MOUTH EVERY DAY 10/12/16  Yes [name], MD  flecainide (TAMBOCOR) 50 MG tablet TAKE 1 TABLET (50 MG TOTAL) BY MOUTH 2 (TWO) TIMES DAILY. 09/12/16  Yes Satira Sark, MD  HYDROcodone-acetaminophen [facility]) 7.5-325 MG tablet One tablet up to four times a day as needed for pain 11/29/16  Yes Owens Shark, NP  ondansetron (ZOFRAN ODT) 4 MG disintegrating tablet Take 1 tablet by mouth every 6 hours as needed for nausea. 09/18/16  Yes [name], MD  pantoprazole (PROTONIX) 40 MG tablet TAKE 1 TABLET EVERY MORNING 10/12/16  Yes [name], MD  promethazine (PHENERGAN) 12.5 MG tablet Take 1 tablet (12.5 mg total) by mouth every 6 (six) hours as needed for nausea or vomiting. 10/30/16  Yes Curt Bears, MD  promethazine (PHENERGAN) 25 MG tablet Take 1 tablet (25 mg total) by mouth every 6 (six) hours as needed for nausea or vomiting. 12/12/16  Yes [name], MD  sucralfate (CARAFATE) 1 g tablet Take 1 tablet (1 g total) by mouth 2 (two) times daily. 11/06/16  Yes [name], MD  warfarin (COUMADIN) 5 MG tablet Take 2.5-5 mg by mouth daily. Patient takes 5mg  on Monday, Wednesday, and Saturday. One Tuesday, Thursday, Friday, and Sunday, patient takes 2.5mg    Yes [provider]  cefdinir (OMNICEF) 300 MG capsule Take 1 capsule (300 mg total) by mouth 2 (two) times daily. Patient not taking: Reported on 12/18/2016  12/07/16   Nilda Simmer, NP    Physical Exam: Vitals:   12/18/16 1720 12/18/16 1845 12/18/16 1922  BP: (!) 101/51 (!) 123/48 110/61  Pulse: (!) 103  94  Resp: 20  18  Temp: 98.5 F (36.9 C)  (!) 100.5 F (38.1 C)  TempSrc: Oral    SpO2: 96%  98%  Weight: 113.4 kg (250 lb)    Height: 5\' 7"  (1.702 m)        Constitutional: NAD, calm, comfortable Vitals:   12/18/16 1720 12/18/16 1845 12/18/16 1922  BP: (!) 101/51 (!) 123/48 110/61  Pulse: (!) 103  94  Resp: 20  18  Temp: 98.5 F (36.9 C)  (!) 100.5 F (38.1 C)  TempSrc: Oral    SpO2: 96%  98%  Weight: 113.4 kg (250 lb)    Height: 5\' 7"  (1.702 m)     Eyes: PERRL, lids and conjunctivae normal ENMT: Mucous membranes are moist.  Posterior pharynx clear of any exudate or lesions.Normal dentition.  Neck: normal, supple, no masses, no thyromegaly Respiratory: scattered rhonchi on the right field. no wheezing, no crackles. Normal respiratory effort. No accessory muscle use.  Cardiovascular: Regular rate and rhythm, no murmurs / rubs / gallops. No extremity edema. 2+ pedal pulses. No carotid bruits.  Abdomen: no tenderness, no masses palpated. No hepatosplenomegaly. Bowel sounds positive.  Musculoskeletal: no clubbing / cyanosis. No joint deformity upper and lower extremities. Good ROM, no contractures. Normal muscle tone.  Skin: no rashes, lesions, ulcers. No induration Neurologic: CN 2-12 grossly intact. Sensation intact, DTR normal. Strength 5/5 in all 4.  Psychiatric: Normal judgment and insight. Alert and oriented x 3. Normal mood.    Labs on Admission: I have personally reviewed following labs and imaging studies CBC:  Recent Labs Lab 12/12/16 1429 12/18/16 1808  WBC 3.4 6.2  NEUTROABS 2.6 4.7  HGB 11.4 11.3*  HCT 34.1 35.7*  MCV 88 91.8  PLT 180 852   Basic Metabolic Panel:  Recent Labs Lab 12/12/16 1429 12/18/16 1808  NA 142 138  K 3.9 3.4*  CL 98 98*  CO2 27 30  GLUCOSE 87 119*  BUN 7* 8    CREATININE 0.76 0.80  CALCIUM 9.1 9.1   GFR: Estimated Creatinine Clearance: 91.1 mL/min (by C-G formula based on SCr of 0.8 mg/dL). Liver Function Tests:  Recent Labs Lab 12/12/16 1429 12/18/16 1808  AST 21 24  ALT 21 18  ALKPHOS 113 106  BILITOT 0.2 0.3  PROT 6.4 7.5  ALBUMIN 3.7 3.2*   Coagulation Profile:  Recent Labs Lab 12/15/16 1016 12/18/16 1808  INR 2.1 2.45   Cardiac Enzymes: Urine analysis:    Component Value Date/Time   COLORURINE YELLOW 10/12/2016 2140   APPEARANCEUR CLEAR 10/12/2016 2140   LABSPEC >1.046 (H) 10/12/2016 2140   PHURINE 6.0 10/12/2016 2140   GLUCOSEU NEGATIVE 10/12/2016 2140   HGBUR NEGATIVE 10/12/2016 2140   [place] NEGATIVE 10/12/2016 2140   [place] NEGATIVE 10/12/2016 2140   PROTEINUR NEGATIVE 10/12/2016 2140   NITRITE NEGATIVE 10/12/2016 2140   LEUKOCYTESUR TRACE (A) 10/12/2016 2140    Recent Results (from the past 240 hour(s))  Culture, blood (Routine X 2) w Reflex to ID Panel     Status: None (Preliminary result)   Collection Time: 12/18/16  6:08 PM  Result Value Ref Range Status   Specimen Description LEFT ANTECUBITAL  Final   Special Requests   Final    BOTTLES DRAWN AEROBIC AND ANAEROBIC Blood Culture adequate volume   Culture PENDING  Incomplete   Report Status PENDING  Incomplete  Culture, blood (Routine X 2) w Reflex to ID Panel     Status: None (Preliminary result)   Collection Time: 12/18/16  6:08 PM  Result Value Ref Range Status   Specimen Description RIGHT ANTECUBITAL  Final   Special Requests   Final    BOTTLES DRAWN AEROBIC ONLY Blood Culture adequate volume   Culture PENDING  Incomplete   Report Status PENDING  Incomplete     Radiological Exams on Admission: Dg Chest Port 1 View  Result Date: 12/18/2016 CLINICAL DATA:  Fever and shortness of breath for 1 week. EXAM: PORTABLE CHEST 1 VIEW COMPARISON:  Chest radiograph 10/11/2016 FINDINGS: Area of increased opacity in the left mid lung. The masslike  area is not apparent on the current study. No pleural effusion or pneumothorax. IMPRESSION: Left mid lung airspace opacities may indicate developing consolidation or may be secondary to  radiation treatment. The previously seen large lung mass is not visualized. Electronically Signed   By: Ulyses Jarred M.D.   On: 12/18/2016 18:35     Assessment/Plan Active Problems:   GERD (gastroesophageal reflux disease)   Warfarin anticoagulation   OSA (obstructive sleep apnea)   Long term current use of anticoagulant therapy   Atrial fibrillation, chronic (HCC)   Squamous cell carcinoma of lung, left (HCC)   Odynophagia   HCAP (healthcare-associated pneumonia)   PLAN:   HCAP:  Will continue with her antibiotics.  She is stable with stable hemodynamics.  Sat is 98 percent with 2 L [place].  BC done.   Afib:  Rate is controlled with Tambocor and cardiazem.  INR is therapeutic, and will continue with coumadin per pharmacy's dosing.   Sleep apnea:  She has been compliant with CPAP.  Will continue, auto titrating.  GERD:  Stable.  No dysphagia.  Odynophagia has resolved.  Continue with PPI.   DVT prophylaxis: Coumadin.  Code Status: FULL CODE.  Family Communication: husband of over 30 years at bedside.  Disposition Plan: Home.  Consults called: None.  Admission status: inpatient.    Skyllar Notarianni MD FACP. [facility]  If 7PM-7AM, please contact night-coverage [email or web address] Password [email or web address]  12/18/2016, 8:05 PM

## 2016-12-18 NOTE — Telephone Encounter (Signed)
Pt instructed to go to [facility] ED now. She said she will go .

## 2016-12-18 NOTE — ED Notes (Signed)
EDP made aware of temp and orders were received

## 2016-12-18 NOTE — ED Triage Notes (Addendum)
Patient complaining of fever and shortness of breath for over a week. States PCP placed patient on antibiotics for unknown infection. States she is worse. States last chemo treatment and radiation was 3 weeks ago. States last tylenol was 1200 today.

## 2016-12-18 NOTE — ED Notes (Signed)
Attempted to give report x2 

## 2016-12-18 NOTE — ED Provider Notes (Signed)
[facility] DEPT Provider Note   CSN: [id number] Arrival date & time: 12/18/16  1713     History   Chief Complaint Chief Complaint  Patient presents with  . Fever  . Shortness of Breath    HPI Sarah [name] is a 66 y.o. female.  HPI Patient is currently undergoing chemotherapy for lung cancer. Had finished antibiotic for fever of unknown origin 4 days ago. Since that time has had fevers up to 101.4. Endorses fatigue, shortness of breath and nonproductive cough. Has chronic left lower extremity swelling compared to right. She is on Coumadin for previous DVT. Past Medical History:  Diagnosis Date  . Arthritis   . Cancer ([place])    lung cancer  . Chronic anxiety    Patient describes as stress  . Chronic back pain   . DVT (deep venous thrombosis) ([place]) age 8  . GERD (gastroesophageal reflux disease)   . Goals of care, counseling/discussion 09/21/2016  . Headache(784.0)   . Hypoestrogenism   . Lower extremity venous stasis    LLE, chronic  . Obesity   . Odynophagia 11/13/2016  . Paroxysmal atrial fibrillation (H[facility])   . PONV (postoperative nausea and vomiting)   . Pulmonary embolism ([place])   . Reflux   . Sleep apnea    wear CPAP  . Spinal stenosis   . Tobacco abuse   . Venous stasis   . Warfarin anticoagulation     Patient Active Problem List   Diagnosis Date Noted  . Odynophagia 11/13/2016  . Chemotherapy induced nausea and vomiting 10/12/2016  . Chemotherapy-induced nausea 10/12/2016  . Goals of care, counseling/discussion 09/21/2016  . Encounter for antineoplastic chemotherapy 09/21/2016  . Squamous cell carcinoma of lung, left ([place]) 09/20/2016  . Lung mass 09/15/2016  . Arthritis 06/23/2013  . Atrial fibrillation, chronic ([place]) 08/30/2012  . Obesity, unspecified 08/30/2012  . Hypokalemia 08/30/2012  . Long term current use of anticoagulant therapy 08/03/2012  . OSA (obstructive sleep apnea) 05/23/2012  . History of pulmonary embolism 12/28/2011  .  Tobacco abuse 12/28/2011  . Chronic anxiety 12/28/2011  . GERD (gastroesophageal reflux disease) 12/28/2011  . Warfarin anticoagulation 12/28/2011    Past Surgical History:  Procedure Laterality Date  . ANKLE SURGERY     x2  . APPENDECTOMY    . CARPAL TUNNEL RELEASE    . CHOLECYSTECTOMY    . OVARIAN CYST REMOVAL    . ROTATOR CUFF REPAIR    . TUBAL LIGATION    . VIDEO BRONCHOSCOPY WITH ENDOBRONCHIAL ULTRASOUND N/A 09/14/2016   Procedure: VIDEO BRONCHOSCOPY WITH ENDOBRONCHIAL ULTRASOUND;  Surgeon: Melrose Nakayama, MD;  Location: [facility] OR;  Service: Thoracic;  Laterality: N/A;    OB History    No data available       Home Medications    Prior to Admission medications   Medication Sig Start Date End Date Taking? Authorizing Provider  buPROPion (WELLBUTRIN SR) 150 MG 12 hr tablet TAKE 1 TABLET BY MOUTH TWICE DAILY **KEEP APPT ON 06/09/16** Patient taking differently: Take 150 mg by mouth daily. TAKE 1 TABLET BY MOUTH TWICE DAILY **KEEP APPT ON 06/09/16** 11/06/16  Yes [name], MD  calcium carbonate (TUMS - DOSED IN MG ELEMENTAL CALCIUM) 500 MG chewable tablet Chew 1 tablet by mouth 2 (two) times a week.    Yes [provider]  diltiazem (CARDIZEM CD) 180 MG 24 hr capsule TAKE ONE CAPSULE BY MOUTH EVERY DAY 10/12/16  Yes [name], MD  flecainide [facility]) 50  MG tablet TAKE 1 TABLET (50 MG TOTAL) BY MOUTH 2 (TWO) TIMES DAILY. 09/12/16  Yes Satira Sark, MD  HYDROcodone-acetaminophen [facility]) 7.5-325 MG tablet One tablet up to four times a day as needed for pain 11/29/16  Yes Owens Shark, NP  ondansetron (ZOFRAN ODT) 4 MG disintegrating tablet Take 1 tablet by mouth every 6 hours as needed for nausea. 09/18/16  Yes [name], MD  pantoprazole (PROTONIX) 40 MG tablet TAKE 1 TABLET EVERY MORNING 10/12/16  Yes [name], MD  promethazine (PHENERGAN) 12.5 MG tablet Take 1 tablet (12.5 mg total) by mouth every 6 (six) hours as needed for nausea  or vomiting. 10/30/16  Yes Curt Bears, MD  promethazine (PHENERGAN) 25 MG tablet Take 1 tablet (25 mg total) by mouth every 6 (six) hours as needed for nausea or vomiting. 12/12/16  Yes [name], MD  sucralfate (CARAFATE) 1 g tablet Take 1 tablet (1 g total) by mouth 2 (two) times daily. 11/06/16  Yes [name], MD  warfarin (COUMADIN) 5 MG tablet Take 2.5-5 mg by mouth daily. Patient takes 5mg  on Monday, Wednesday, and Saturday. One Tuesday, Thursday, Friday, and Sunday, patient takes 2.5mg    Yes [provider]  cefdinir (OMNICEF) 300 MG capsule Take 1 capsule (300 mg total) by mouth 2 (two) times daily. Patient not taking: Reported on 12/18/2016 12/07/16   Nilda Simmer, NP    Family History Family History  Problem Relation Age of Onset  . Arrhythmia Mother   . Cancer Mother        colon  . Heart disease Mother   . Stroke Father        Deceased    Social History Social History  Substance Use Topics  . Smoking status: Former Smoker    Packs/day: 1.00    Years: 40.00    Types: Cigarettes    Quit date: 08/29/2016  . Smokeless tobacco: Never Used  . Alcohol use No     Allergies   Aspirin   Review of Systems Review of Systems  Constitutional: Positive for chills, fatigue and fever.  HENT: Negative for congestion, sinus pain, sinus pressure, sore throat, trouble swallowing and voice change.   Eyes: Negative for photophobia and visual disturbance.  Respiratory: Positive for cough and shortness of breath.   Cardiovascular: Negative for chest pain and palpitations.  Gastrointestinal: Positive for nausea. Negative for abdominal pain, constipation, diarrhea and vomiting.  Genitourinary: Negative for difficulty urinating, dysuria, flank pain, frequency and hematuria.  Musculoskeletal: Negative for back pain, myalgias, neck pain and neck stiffness.  Skin: Negative for rash and wound.  Neurological: Negative for dizziness, weakness, light-headedness,  numbness and headaches.  All other systems reviewed and are negative.    Physical Exam Updated Vital Signs BP 110/61 (BP Location: Left Arm)   Pulse 94   Temp (!) 100.5 F (38.1 C)   Resp 18   Ht 5\' 7"  (1.702 m)   Wt 113.4 kg (250 lb)   SpO2 98%   BMI 39.16 kg/m   Physical Exam  Constitutional: She is oriented to person, place, and time. She appears well-developed and well-nourished. No distress.  HENT:  Head: Normocephalic and atraumatic.  Mouth/Throat: Oropharynx is clear and moist. No oropharyngeal exudate.  Eyes: Pupils are equal, round, and reactive to light. EOM are normal.  Neck: Normal range of motion. Neck supple.  No meningismus  Cardiovascular: Normal rate and regular rhythm.  Exam reveals no gallop and no  friction rub.   No murmur heard. Pulmonary/Chest: Effort normal. She has wheezes. She has rales.  Expiratory wheezing in bilateral lung fields. Crackles in bilateral bases.  Abdominal: Soft. Bowel sounds are normal. There is no tenderness. There is no rebound and no guarding.  Musculoskeletal: Normal range of motion. She exhibits no edema or tenderness.  No CVA tenderness bilaterally. Patient does have left lower extremity swelling compared to right. Some distended superficial veins noted. No tenderness to palpation.  Lymphadenopathy:    She has no cervical adenopathy.  Neurological: She is alert and oriented to person, place, and time.  Moves all extremities without focal deficit. Sensation intact.  Skin: Skin is warm and dry. Capillary refill takes less than 2 seconds. No rash noted. She is not diaphoretic. No erythema.  Psychiatric: She has a normal mood and affect. Her behavior is normal.  Nursing note and vitals reviewed.    ED Treatments / Results  Labs (all labs ordered are listed, but only abnormal results are displayed) Labs Reviewed  CBC WITH DIFFERENTIAL/PLATELET - Abnormal; Notable for the following:       Result Value   Hemoglobin 11.3 (*)      HCT 35.7 (*)    RDW 25.0 (*)    All other components within normal limits  COMPREHENSIVE METABOLIC PANEL - Abnormal; Notable for the following:    Potassium 3.4 (*)    Chloride 98 (*)    Glucose, Bld 119 (*)    Albumin 3.2 (*)    All other components within normal limits  PROTIME-INR - Abnormal; Notable for the following:    Prothrombin Time 27.0 (*)    All other components within normal limits  CULTURE, BLOOD (ROUTINE X 2)  CULTURE, BLOOD (ROUTINE X 2)  URINALYSIS, ROUTINE W REFLEX MICROSCOPIC  I-STAT CG4 LACTIC ACID, ED    EKG  EKG Interpretation None       Radiology Dg Chest Port 1 View  Result Date: 12/18/2016 CLINICAL DATA:  Fever and shortness of breath for 1 week. EXAM: PORTABLE CHEST 1 VIEW COMPARISON:  Chest radiograph 10/11/2016 FINDINGS: Area of increased opacity in the left mid lung. The masslike area is not apparent on the current study. No pleural effusion or pneumothorax. IMPRESSION: Left mid lung airspace opacities may indicate developing consolidation or may be secondary to radiation treatment. The previously seen large lung mass is not visualized. Electronically Signed   By: Ulyses Jarred M.D.   On: 12/18/2016 18:35    Procedures Procedures (including critical care time)  Medications Ordered in ED Medications  vancomycin (VANCOCIN) IVPB 1000 mg/200 mL premix (1,000 mg Intravenous New Bag/Given 12/18/16 1919)  acetaminophen (TYLENOL) tablet 650 mg (not administered)  sodium chloride 0.9 % bolus 500 mL (0 mLs Intravenous Stopped 12/18/16 1924)  piperacillin-tazobactam (ZOSYN) IVPB 3.375 g (0 g Intravenous Stopped 12/18/16 1952)     Initial Impression / Assessment and Plan / ED Course  I have reviewed the triage vital signs and the nursing notes.  Pertinent labs & imaging results that were available during my care of the patient were reviewed by me and considered in my medical decision making (see chart for details).    Patient with leftMidlung  infiltrate. Given infective symptoms we'll treat for healthcare associated pneumonia. Patient's oxygen saturations did drop into the high 80s. Placed on 2 L of supplemental oxygen. Discussed with hospitalist who will admit.   Final Clinical Impressions(s) / ED Diagnoses   Final diagnoses:  Healthcare-associated pneumonia  New Prescriptions New Prescriptions   No medications on file     Julianne Rice, MD 12/18/16 [phone]

## 2016-12-18 NOTE — ED Notes (Signed)
Pt given a meal tray and Sprite Zero upon request.

## 2016-12-18 NOTE — Telephone Encounter (Signed)
Pt with fevers "24/7 range from 99.4-101.4".  She  is not feeling well-.extremely SOB. CT scan tomorrow. Note to [place].

## 2016-12-18 NOTE — Telephone Encounter (Signed)
Message from pt, she continues to have fevers. Asks how long she should expect this? Is there anything she can do?  Message forwarded to Dr. Worthy Flank pod.

## 2016-12-18 NOTE — Telephone Encounter (Signed)
If she is extremely short of breath and has fever, she will need to go to the emergency department.

## 2016-12-19 ENCOUNTER — Telehealth: Payer: Self-pay | Admitting: *Deleted

## 2016-12-19 ENCOUNTER — Ambulatory Visit (HOSPITAL_COMMUNITY): Payer: Medicare Other

## 2016-12-19 ENCOUNTER — Encounter (HOSPITAL_COMMUNITY): Payer: Self-pay

## 2016-12-19 LAB — CBC
HEMATOCRIT: 32.3 % — AB (ref 36.0–46.0)
Hemoglobin: 10.4 g/dL — ABNORMAL LOW (ref 12.0–15.0)
MCH: 29.5 pg (ref 26.0–34.0)
MCHC: 32.2 g/dL (ref 30.0–36.0)
MCV: 91.5 fL (ref 78.0–100.0)
Platelets: 180 10*3/uL (ref 150–400)
RBC: 3.53 MIL/uL — AB (ref 3.87–5.11)
RDW: 24.7 % — AB (ref 11.5–15.5)
WBC: 6 10*3/uL (ref 4.0–10.5)

## 2016-12-19 LAB — BASIC METABOLIC PANEL
Anion gap: 9 (ref 5–15)
BUN: 7 mg/dL (ref 6–20)
CHLORIDE: 101 mmol/L (ref 101–111)
CO2: 29 mmol/L (ref 22–32)
Calcium: 8.8 mg/dL — ABNORMAL LOW (ref 8.9–10.3)
Creatinine, Ser: 0.86 mg/dL (ref 0.44–1.00)
GFR calc Af Amer: >60 mL/min (ref >60)
GFR calc non Af Amer: >60 mL/min (ref >60)
Glucose, Bld: 111 mg/dL — ABNORMAL HIGH (ref 65–99)
POTASSIUM: 3.5 mmol/L (ref 3.5–5.1)
SODIUM: 139 mmol/L (ref 135–145)

## 2016-12-19 LAB — PROTIME-INR
INR: 2.13
Prothrombin Time: 24.1 seconds — ABNORMAL HIGH (ref 11.4–15.2)

## 2016-12-19 LAB — URINALYSIS, ROUTINE W REFLEX MICROSCOPIC
Bilirubin Urine: NEGATIVE
Glucose, UA: NEGATIVE mg/dL
Hgb urine dipstick: NEGATIVE
Ketones, ur: NEGATIVE mg/dL
Leukocytes, UA: NEGATIVE
NITRITE: NEGATIVE
Protein, ur: NEGATIVE mg/dL
SPECIFIC GRAVITY, URINE: 1.016 (ref 1.005–1.030)
pH: 5 (ref 5.0–8.0)

## 2016-12-19 MED ORDER — FLECAINIDE ACETATE 100 MG PO TABS
ORAL_TABLET | ORAL | Status: AC
  Filled 2016-12-19: qty 1

## 2016-12-19 MED ORDER — WARFARIN SODIUM 2.5 MG PO TABS
2.5000 mg | ORAL_TABLET | Freq: Once | ORAL | Status: AC
  Administered 2016-12-19: 2.5 mg via ORAL
  Filled 2016-12-19: qty 1

## 2016-12-19 MED ORDER — PIPERACILLIN-TAZOBACTAM 3.375 G IVPB
3.3750 g | Freq: Three times a day (TID) | INTRAVENOUS | Status: DC
  Administered 2016-12-19 – 2016-12-20 (×3): 3.375 g via INTRAVENOUS
  Filled 2016-12-19 (×3): qty 50

## 2016-12-19 MED ORDER — SODIUM CHLORIDE 0.9 % IV SOLN
INTRAVENOUS | Status: DC
  Administered 2016-12-19: 09:00:00 via INTRAVENOUS

## 2016-12-19 MED ORDER — WARFARIN - PHARMACIST DOSING INPATIENT
Status: DC
  Administered 2016-12-19: 16:00:00

## 2016-12-19 MED ORDER — VANCOMYCIN HCL IN DEXTROSE 1-5 GM/200ML-% IV SOLN
1000.0000 mg | Freq: Two times a day (BID) | INTRAVENOUS | Status: DC
Start: 2016-12-19 — End: 2016-12-20
  Administered 2016-12-19 – 2016-12-20 (×3): 1000 mg via INTRAVENOUS
  Filled 2016-12-19 (×3): qty 200

## 2016-12-19 NOTE — Telephone Encounter (Signed)
Oncology Nurse Navigator Documentation  Oncology Nurse Navigator Flowsheets 12/19/2016  Navigator Location [facility]  Navigator Encounter Type Telephone/I followed up on Dr. Worthy Flank last note for patient. He would like her to get a CT scan before he sees her on 8/28.  I called patient to update her and give her central scheduling's number to call.  I was unable to reach or leave vm message.   Telephone Outgoing Call  Treatment Phase Follow-up  Barriers/Navigation Needs Coordination of Care;Education  Education Other  Interventions Coordination of Care;Education  Coordination of Care Other  Education Method Verbal  Acuity Level 1  Time Spent with Patient 30

## 2016-12-19 NOTE — Care Management Note (Signed)
Case Management Note  Patient Details  Name: Sarah [name] MRN: [id number] Date of Birth: [birth year]  Subjective/Objective: Adm with HCAP.Marland Kitchen Patient goes to [facility] for chemotherapy for squamous cell carcinoma of left lung.   She is ind PTA. No HH or DME. She has PCP, still drives to all her appointments. She has Medicare, reports no issues affording medications.                            Action/Plan: Anticipate DC home with self care. CM following for needs.  Expected Discharge Date:     12/21/2016             Expected Discharge Plan:  Home/Self Care  In-House Referral:     Discharge planning Services  CM Consult  Post Acute Care Choice:    Choice offered to:     DME Arranged:    DME Agency:     HH Arranged:    HH Agency:     Status of Service:  In process, will continue to follow  If discussed at Long Length of Stay Meetings, dates discussed:    Additional Comments:  Jahnavi Muratore, Chauncey Reading, RN 12/19/2016, 1:53 PM

## 2016-12-19 NOTE — Progress Notes (Signed)
PROGRESS NOTE    Sarah [name]  HQI:[id number] DOB: 1950-08-22 DOA: 12/18/2016 PCP: [name], MD     Brief Narrative:    66 y.o. female with hx of SCC of the lung, undergoing chemotherapy with Dr Earlie Server, hx of afib and DVT on therapeutic coumadin, INR 2.45, GERD, sleep apnea with CPAP compliant, spinal stenosis, obesity, hx of odynophagia but has improved, presents to the ER with temp 101.4 at home, coughs, and mild SOB   Assessment & Plan:     HCAP (healthcare-associated pneumonia)   GERD (gastroesophageal reflux disease)   Warfarin anticoagulation   OSA (obstructive sleep apnea)   Long term current use of anticoagulant therapy   Atrial fibrillation, chronic (HCC)   Squamous cell carcinoma of lung, left (HCC)   Odynophagia   Continue IV abx for one more day before switching to oral Lev/Doxy, Nubs , improving , add IVF.      DVT prophylaxis: (Lovenox/ Code Status: (Full Family Communication: (none  Disposition Plan: (home   Consultants:   None    Procedures: none  Antimicrobials: (specify start and planned stop date. Auto populated tables are space occupying and do not give end dates)  Zosyn and Vanc    Subjective: SOB has improved, no more fever , denies any N/V.  Objective: Vitals:   12/18/16 2300 12/18/16 2303 12/19/16 0030 12/19/16 0451  BP: (!) 110/50 (!) 110/50 (!) 84/45 (!) 101/47  Pulse: 97 97 91 97  Resp:  18 16   Temp:  98.4 F (36.9 C) 98.6 F (37 C)   TempSrc:  Oral Oral   SpO2: 96% 96% 96% 96%  Weight:   114.2 kg (251 lb 12.3 oz)   Height:   5\' 7"  (1.702 m)     Intake/Output Summary (Last 24 hours) at 12/19/16 0924 Last data filed at 12/19/16 0615  Gross per 24 hour  Intake             1280 ml  Output              150 ml  Net             1130 ml   Filed Weights   12/18/16 1720 12/19/16 0030  Weight: 113.4 kg (250 lb) 114.2 kg (251 lb 12.3 oz)    Examination:  General exam: Appears calm and comfortable    Respiratory system: Clear to auscultation. Respiratory effort normal. Cardiovascular system: S1 & S2 heard, RRR. No JVD, murmurs, rubs, gallops or clicks. No pedal edema. Gastrointestinal system: Abdomen is nondistended, soft and nontender. No organomegaly or masses felt. Normal bowel sounds heard. Central nervous system: Alert and oriented. No focal neurological deficits. Extremities: Symmetric 5 x 5 power. Skin: No rashes, lesions or ulcers Psychiatry: Judgement and insight appear normal. Mood & affect appropriate.     Data Reviewed: I have personally reviewed following labs and imaging studies  CBC:  Recent Labs Lab 12/12/16 1429 12/18/16 1808 12/19/16 0744  WBC 3.4 6.2 6.0  NEUTROABS 2.6 4.7  --   HGB 11.4 11.3* 10.4*  HCT 34.1 35.7* 32.3*  MCV 88 91.8 91.5  PLT 180 185 132   Basic Metabolic Panel:  Recent Labs Lab 12/12/16 1429 12/18/16 1808 12/19/16 0744  NA 142 138 139  K 3.9 3.4* 3.5  CL 98 98* 101  CO2 27 30 29   GLUCOSE 87 119* 111*  BUN 7* 8 7  CREATININE 0.76 0.80 0.86  CALCIUM 9.1 9.1 8.8*   GFR:  Estimated Creatinine Clearance: 85 mL/min (by C-G formula based on SCr of 0.86 mg/dL). Liver Function Tests:  Recent Labs Lab 12/12/16 1429 12/18/16 1808  AST 21 24  ALT 21 18  ALKPHOS 113 106  BILITOT 0.2 0.3  PROT 6.4 7.5  ALBUMIN 3.7 3.2*   No results for input(s): LIPASE, AMYLASE in the last 168 hours. No results for input(s): AMMONIA in the last 168 hours. Coagulation Profile:  Recent Labs Lab 12/15/16 1016 12/18/16 1808 12/19/16 0744  INR 2.1 2.45 2.13   Cardiac Enzymes: No results for input(s): CKTOTAL, CKMB, CKMBINDEX, TROPONINI in the last 168 hours. BNP (last 3 results) No results for input(s): PROBNP in the last 8760 hours. HbA1C: No results for input(s): HGBA1C in the last 72 hours. CBG: No results for input(s): GLUCAP in the last 168 hours. Lipid Profile: No results for input(s): CHOL, HDL, LDLCALC, TRIG, CHOLHDL,  LDLDIRECT in the last 72 hours. Thyroid Function Tests: No results for input(s): TSH, T4TOTAL, FREET4, T3FREE, THYROIDAB in the last 72 hours. Anemia Panel: No results for input(s): VITAMINB12, FOLATE, FERRITIN, TIBC, IRON, RETICCTPCT in the last 72 hours. Urine analysis:    Component Value Date/Time   COLORURINE YELLOW 12/19/2016 0126   APPEARANCEUR HAZY (A) 12/19/2016 0126   LABSPEC 1.016 12/19/2016 0126   PHURINE 5.0 12/19/2016 0126   GLUCOSEU NEGATIVE 12/19/2016 0126   HGBUR NEGATIVE 12/19/2016 0126   BILIRUBINUR NEGATIVE 12/19/2016 0126   KETONESUR NEGATIVE 12/19/2016 0126   PROTEINUR NEGATIVE 12/19/2016 0126   NITRITE NEGATIVE 12/19/2016 0126   LEUKOCYTESUR NEGATIVE 12/19/2016 0126   Sepsis Labs: @LABRCNTIP (procalcitonin:4,lacticidven:4)  ) Recent Results (from the past 240 hour(s))  Culture, blood (Routine X 2) w Reflex to ID Panel     Status: None (Preliminary result)   Collection Time: 12/18/16  6:08 PM  Result Value Ref Range Status   Specimen Description LEFT ANTECUBITAL  Final   Special Requests   Final    BOTTLES DRAWN AEROBIC AND ANAEROBIC Blood Culture adequate volume   Culture NO GROWTH < 24 HOURS  Final   Report Status PENDING  Incomplete  Culture, blood (Routine X 2) w Reflex to ID Panel     Status: None (Preliminary result)   Collection Time: 12/18/16  6:08 PM  Result Value Ref Range Status   Specimen Description RIGHT ANTECUBITAL  Final   Special Requests   Final    BOTTLES DRAWN AEROBIC ONLY Blood Culture adequate volume   Culture NO GROWTH < 24 HOURS  Final   Report Status PENDING  Incomplete         Radiology Studies: Dg Chest Port 1 View  Result Date: 12/18/2016 CLINICAL DATA:  Fever and shortness of breath for 1 week. EXAM: PORTABLE CHEST 1 VIEW COMPARISON:  Chest radiograph 10/11/2016 FINDINGS: Area of increased opacity in the left mid lung. The masslike area is not apparent on the current study. No pleural effusion or pneumothorax.  IMPRESSION: Left mid lung airspace opacities may indicate developing consolidation or may be secondary to radiation treatment. The previously seen large lung mass is not visualized. Electronically Signed   By: Ulyses Jarred M.D.   On: 12/18/2016 18:35        Scheduled Meds: . buPROPion  150 mg Oral Daily  . [START ON 12/21/2016] calcium carbonate  1 tablet Oral Once per day on Mon Thu  . diltiazem  180 mg Oral Daily  . flecainide  50 mg Oral Q12H  . pantoprazole  40 mg Oral q morning -  10a  . sodium chloride flush  3 mL Intravenous Q12H   Continuous Infusions: . sodium chloride 75 mL/hr at 12/19/16 0851  . piperacillin-tazobactam (ZOSYN)  IV    . vancomycin       LOS: 1 day    Time spent: [place], MD [facility]   If 7PM-7AM, please contact night-coverage [email or web address] Password [facility] 12/19/2016, 9:24 AM

## 2016-12-19 NOTE — Progress Notes (Signed)
ANTIBIOTIC CONSULT NOTE follow up  Pharmacy Consult for VAncomycin and Zosyn Indication: Pneumonia  Allergies  Allergen Reactions  . Aspirin Nausea And Vomiting    Patient Measurements: Height: 5\' 7"  (170.2 cm) Weight: 251 lb 12.3 oz (114.2 kg) IBW/kg (Calculated) : 61.6   Vital Signs: Temp: 98.6 F (37 C) (08/21 0030) Temp Source: Oral (08/21 0030) BP: 101/47 (08/21 0451) Pulse Rate: 97 (08/21 0451)  Labs:  Recent Labs  12/18/16 1808 12/19/16 0744  WBC 6.2 6.0  HGB 11.3* 10.4*  PLT 185 180  CREATININE 0.80 0.86    Estimated Creatinine Clearance: 85 mL/min (by C-G formula based on SCr of 0.86 mg/dL).  No results for input(s): VANCOTROUGH, VANCOPEAK, VANCORANDOM, GENTTROUGH, GENTPEAK, GENTRANDOM, TOBRATROUGH, TOBRAPEAK, TOBRARND, AMIKACINPEAK, AMIKACINTROU, AMIKACIN in the last 72 hours.   Microbiology: Recent Results (from the past 720 hour(s))  TECHNOLOGIST REVIEW     Status: None   Collection Time: 11/27/16 12:44 PM  Result Value Ref Range Status   Technologist Review rare myelocyte  Final  Urine Culture     Status: None   Collection Time: 12/07/16 12:00 AM  Result Value Ref Range Status   Urine Culture, Routine Final report  Final   Organism ID, Bacteria Comment  Final    Comment: Culture shows less than 10,000 colony forming units of bacteria per milliliter of urine. This colony count is not generally considered to be clinically significant.   Culture, blood (Routine X 2) w Reflex to ID Panel     Status: None (Preliminary result)   Collection Time: 12/18/16  6:08 PM  Result Value Ref Range Status   Specimen Description LEFT ANTECUBITAL  Final   Special Requests   Final    BOTTLES DRAWN AEROBIC AND ANAEROBIC Blood Culture adequate volume   Culture NO GROWTH < 24 HOURS  Final   Report Status PENDING  Incomplete  Culture, blood (Routine X 2) w Reflex to ID Panel     Status: None (Preliminary result)   Collection Time: 12/18/16  6:08 PM  Result Value  Ref Range Status   Specimen Description RIGHT ANTECUBITAL  Final   Special Requests   Final    BOTTLES DRAWN AEROBIC ONLY Blood Culture adequate volume   Culture NO GROWTH < 24 HOURS  Final   Report Status PENDING  Incomplete    Medical History: Past Medical History:  Diagnosis Date  . Arthritis   . Cancer ([place])    lung cancer  . Chronic anxiety    Patient describes as stress  . Chronic back pain   . DVT (deep venous thrombosis) ([place]) age 75  . GERD (gastroesophageal reflux disease)   . Goals of care, counseling/discussion 09/21/2016  . Headache(784.0)   . Hypoestrogenism   . Lower extremity venous stasis    LLE, chronic  . Obesity   . Odynophagia 11/13/2016  . Paroxysmal atrial fibrillation (HCC)   . PONV (postoperative nausea and vomiting)   . Pulmonary embolism ([place])   . Reflux   . Sleep apnea    wear CPAP  . Spinal stenosis   . Tobacco abuse   . Venous stasis   . Warfarin anticoagulation    Medications:  Vancomycin 1000 mg IV given in the ED Zosyn 3.375 Gm IV given in the ED  Assessment: 66 yo female undergoing chemotherapy for SCC of lung seen in the ED for elevated temp, cough and SOB. Outpatient Tx w/ Po Omnicef completed without resolution of symptoms.Possible Infiltrates seen on chest xray.  Pt to be admitted for HCAP on empiric antibiotics.  Goal of Therapy:  Vancomycin troughs 15-20 mcg/ml Eradicated infection  Plan:  Preliminary review of pertinent patient information completed.  Protocol will be initiated with doses of Vancomycin 1000 mg in addition to the initial ED dose for a total loading dose of 2000 mg, and Zosyn 3.375 Gm IV @8  hours after the initial ED dose. Forestine Na clinical pharmacist will complete review during morning rounds to assess patient and finalize treatment regimen if needed.  Addendum:  Vancomycin 1gm IV q12hrs,  Zosyn 3.375gm IV q8h, EID Monitor labs, progress c/s  Hart Robinsons A, RPH 12/19/2016,11:14 AM

## 2016-12-19 NOTE — Progress Notes (Signed)
Visit made to patients room to discuss setting up Cpap for night time rest. Pt states she wears one at home but is fine here without it for a few nights.  Advised pt that if she changes her mind to let us know and we would set her up.

## 2016-12-19 NOTE — Progress Notes (Signed)
ANTICOAGULATION CONSULT NOTE - Initial Consult  Pharmacy Consult for Coumadin (home med) Indication: atrial fibrillation  Allergies  Allergen Reactions  . Aspirin Nausea And Vomiting    Patient Measurements: Height: 5\' 7"  (170.2 cm) Weight: 251 lb 12.3 oz (114.2 kg) IBW/kg (Calculated) : 61.6  Vital Signs: Temp: 98.6 F (37 C) (08/21 0030) Temp Source: Oral (08/21 0030) BP: 101/47 (08/21 0451) Pulse Rate: 97 (08/21 0451)  Labs:  Recent Labs  12/18/16 1808 12/19/16 0744  HGB 11.3* 10.4*  HCT 35.7* 32.3*  PLT 185 180  LABPROT 27.0* 24.1*  INR 2.45 2.13  CREATININE 0.80 0.86    Estimated Creatinine Clearance: 85 mL/min (by C-G formula based on SCr of 0.86 mg/dL).   Medical History: Past Medical History:  Diagnosis Date  . Arthritis   . Cancer ([place])    lung cancer  . Chronic anxiety    Patient describes as stress  . Chronic back pain   . DVT (deep venous thrombosis) ([place]) age 36  . GERD (gastroesophageal reflux disease)   . Goals of care, counseling/discussion 09/21/2016  . Headache(784.0)   . Hypoestrogenism   . Lower extremity venous stasis    LLE, chronic  . Obesity   . Odynophagia 11/13/2016  . Paroxysmal atrial fibrillation ([facility])   . PONV (postoperative nausea and vomiting)   . Pulmonary embolism ([place])   . Reflux   . Sleep apnea    wear CPAP  . Spinal stenosis   . Tobacco abuse   . Venous stasis   . Warfarin anticoagulation     Medications:  Prescriptions Prior to Admission  Medication Sig Dispense Refill Last Dose  . buPROPion (WELLBUTRIN SR) 150 MG 12 hr tablet TAKE 1 TABLET BY MOUTH TWICE DAILY **KEEP APPT ON 06/09/16** (Patient taking differently: Take 150 mg by mouth daily. TAKE 1 TABLET BY MOUTH TWICE DAILY **KEEP APPT ON 06/09/16**) 180 tablet 0 12/17/2016 at Unknown time  . calcium carbonate (TUMS - DOSED IN MG ELEMENTAL CALCIUM) 500 MG chewable tablet Chew 1 tablet by mouth 2 (two) times a week.    Past Month at Unknown time  .  diltiazem (CARDIZEM CD) 180 MG 24 hr capsule TAKE ONE CAPSULE BY MOUTH EVERY DAY 90 capsule 1 12/18/2016 at 0930  . flecainide (TAMBOCOR) 50 MG tablet TAKE 1 TABLET (50 MG TOTAL) BY MOUTH 2 (TWO) TIMES DAILY. 60 tablet 6 12/18/2016 at 0930  . HYDROcodone-acetaminophen (NORCO) 7.5-325 MG tablet One tablet up to four times a day as needed for pain 60 tablet 0 Past Month at Unknown time  . ondansetron (ZOFRAN ODT) 4 MG disintegrating tablet Take 1 tablet by mouth every 6 hours as needed for nausea. 21 tablet 0 Past Month at Unknown time  . pantoprazole (PROTONIX) 40 MG tablet TAKE 1 TABLET EVERY MORNING 90 tablet 1 12/18/2016 at 0930  . promethazine (PHENERGAN) 12.5 MG tablet Take 1 tablet (12.5 mg total) by mouth every 6 (six) hours as needed for nausea or vomiting. 30 tablet 0 12/18/2016 at Unknown time  . promethazine (PHENERGAN) 25 MG tablet Take 1 tablet (25 mg total) by mouth every 6 (six) hours as needed for nausea or vomiting. 30 tablet 0 Past Month at Unknown time  . sucralfate (CARAFATE) 1 g tablet Take 1 tablet (1 g total) by mouth 2 (two) times daily. 60 tablet 0 Past Week at Unknown time  . warfarin (COUMADIN) 5 MG tablet Take 2.5-5 mg by mouth daily. Patient takes 5mg  on Monday, Wednesday, and Saturday.  One Tuesday, Thursday, Friday, and Sunday, patient takes 2.5mg    12/17/2016 at Unknown time  . cefdinir (OMNICEF) 300 MG capsule Take 1 capsule (300 mg total) by mouth 2 (two) times daily. (Patient not taking: Reported on 12/18/2016) 14 capsule 0 Completed Course at Unknown time    Assessment: 66yo female on chronic Coumadin for h/o afib.  Home dose listed above.  INR therapeutic on admission.   Goal of Therapy:  INR 2-3 Monitor platelets by anticoagulation protocol: Yes   Plan:  Coumadin 2.5mg  today x 1 (home dose) INR daily  [place] Crane, Anjani Feuerborn A 12/19/2016,11:12 AM

## 2016-12-20 ENCOUNTER — Telehealth: Payer: Self-pay | Admitting: *Deleted

## 2016-12-20 DIAGNOSIS — Z7901 Long term (current) use of anticoagulants: Secondary | ICD-10-CM

## 2016-12-20 DIAGNOSIS — I482 Chronic atrial fibrillation: Secondary | ICD-10-CM

## 2016-12-20 DIAGNOSIS — J189 Pneumonia, unspecified organism: Principal | ICD-10-CM

## 2016-12-20 LAB — COMPREHENSIVE METABOLIC PANEL
ALK PHOS: 91 U/L (ref 38–126)
ALT: 15 U/L (ref 14–54)
ANION GAP: 10 (ref 5–15)
AST: 18 U/L (ref 15–41)
Albumin: 2.8 g/dL — ABNORMAL LOW (ref 3.5–5.0)
BILIRUBIN TOTAL: 0.6 mg/dL (ref 0.3–1.2)
BUN: 7 mg/dL (ref 6–20)
CALCIUM: 8.7 mg/dL — AB (ref 8.9–10.3)
CO2: 31 mmol/L (ref 22–32)
Chloride: 101 mmol/L (ref 101–111)
Creatinine, Ser: 0.8 mg/dL (ref 0.44–1.00)
GFR calc Af Amer: >60 mL/min (ref >60)
Glucose, Bld: 93 mg/dL (ref 65–99)
POTASSIUM: 3.4 mmol/L — AB (ref 3.5–5.1)
Sodium: 142 mmol/L (ref 135–145)
TOTAL PROTEIN: 6.6 g/dL (ref 6.5–8.1)

## 2016-12-20 LAB — CBC
HEMATOCRIT: 32.8 % — AB (ref 36.0–46.0)
Hemoglobin: 10.6 g/dL — ABNORMAL LOW (ref 12.0–15.0)
MCH: 29.8 pg (ref 26.0–34.0)
MCHC: 32.3 g/dL (ref 30.0–36.0)
MCV: 92.1 fL (ref 78.0–100.0)
Platelets: 190 10*3/uL (ref 150–400)
RBC: 3.56 MIL/uL — ABNORMAL LOW (ref 3.87–5.11)
RDW: 24.6 % — AB (ref 11.5–15.5)
WBC: 5 10*3/uL (ref 4.0–10.5)

## 2016-12-20 LAB — PROTIME-INR
INR: 1.89
Prothrombin Time: 22 seconds — ABNORMAL HIGH (ref 11.4–15.2)

## 2016-12-20 LAB — HIV ANTIBODY (ROUTINE TESTING W REFLEX): HIV Screen 4th Generation wRfx: NONREACTIVE

## 2016-12-20 MED ORDER — LEVOFLOXACIN 750 MG PO TABS: 750.0000 mg | ORAL_TABLET | Freq: Every day | ORAL | 0 refills | Status: AC

## 2016-12-20 MED ORDER — WARFARIN SODIUM 5 MG PO TABS: 6.0000 mg | ORAL_TABLET | Freq: Once | ORAL | Status: DC

## 2016-12-20 NOTE — Progress Notes (Signed)
[place] for Coumadin (home med) Indication: atrial fibrillation  Allergies  Allergen Reactions  . Aspirin Nausea And Vomiting    Patient Measurements: Height: 5\' 7"  (170.2 cm) Weight: 251 lb 12.3 oz (114.2 kg) IBW/kg (Calculated) : 61.6  Vital Signs: Temp: 99.3 F (37.4 C) (08/22 0400) Temp Source: Oral (08/22 0400) BP: 95/51 (08/22 0400) Pulse Rate: 90 (08/22 0400)  Labs:  Recent Labs  12/18/16 1808 12/19/16 0744 12/20/16 0449  HGB 11.3* 10.4* 10.6*  HCT 35.7* 32.3* 32.8*  PLT 185 180 190  LABPROT 27.0* 24.1* 22.0*  INR 2.45 2.13 1.89  CREATININE 0.80 0.86 0.80    Estimated Creatinine Clearance: 91.4 mL/min (by C-G formula based on SCr of 0.8 mg/dL).   Medical History: Past Medical History:  Diagnosis Date  . Arthritis   . Cancer ([place])    lung cancer  . Chronic anxiety    Patient describes as stress  . Chronic back pain   . DVT (deep venous thrombosis) ([place]) age 74  . GERD (gastroesophageal reflux disease)   . Goals of care, counseling/discussion 09/21/2016  . Headache(784.0)   . Hypoestrogenism   . Lower extremity venous stasis    LLE, chronic  . Obesity   . Odynophagia 11/13/2016  . Paroxysmal atrial fibrillation ([facility])   . PONV (postoperative nausea and vomiting)   . Pulmonary embolism ([place])   . Reflux   . Sleep apnea    wear CPAP  . Spinal stenosis   . Tobacco abuse   . Venous stasis   . Warfarin anticoagulation     Medications:  Prescriptions Prior to Admission  Medication Sig Dispense Refill Last Dose  . buPROPion (WELLBUTRIN SR) 150 MG 12 hr tablet TAKE 1 TABLET BY MOUTH TWICE DAILY **KEEP APPT ON 06/09/16** (Patient taking differently: Take 150 mg by mouth daily. TAKE 1 TABLET BY MOUTH TWICE DAILY **KEEP APPT ON 06/09/16**) 180 tablet 0 12/17/2016 at Unknown time  . calcium carbonate (TUMS - DOSED IN MG ELEMENTAL CALCIUM) 500 MG chewable tablet Chew 1 tablet by mouth 2 (two) times a week.    Past Month  at Unknown time  . diltiazem (CARDIZEM CD) 180 MG 24 hr capsule TAKE ONE CAPSULE BY MOUTH EVERY DAY 90 capsule 1 12/18/2016 at 0930  . flecainide (TAMBOCOR) 50 MG tablet TAKE 1 TABLET (50 MG TOTAL) BY MOUTH 2 (TWO) TIMES DAILY. 60 tablet 6 12/18/2016 at 0930  . HYDROcodone-acetaminophen (NORCO) 7.5-325 MG tablet One tablet up to four times a day as needed for pain 60 tablet 0 Past Month at Unknown time  . ondansetron (ZOFRAN ODT) 4 MG disintegrating tablet Take 1 tablet by mouth every 6 hours as needed for nausea. 21 tablet 0 Past Month at Unknown time  . pantoprazole (PROTONIX) 40 MG tablet TAKE 1 TABLET EVERY MORNING 90 tablet 1 12/18/2016 at 0930  . promethazine (PHENERGAN) 12.5 MG tablet Take 1 tablet (12.5 mg total) by mouth every 6 (six) hours as needed for nausea or vomiting. 30 tablet 0 12/18/2016 at Unknown time  . promethazine (PHENERGAN) 25 MG tablet Take 1 tablet (25 mg total) by mouth every 6 (six) hours as needed for nausea or vomiting. 30 tablet 0 Past Month at Unknown time  . sucralfate (CARAFATE) 1 g tablet Take 1 tablet (1 g total) by mouth 2 (two) times daily. 60 tablet 0 Past Week at Unknown time  . warfarin (COUMADIN) 5 MG tablet Take 2.5-5 mg by mouth daily. Patient takes 5mg  on  Monday, Wednesday, and Saturday. One Tuesday, Thursday, Friday, and Sunday, patient takes 2.5mg    12/17/2016 at Unknown time  . cefdinir (OMNICEF) 300 MG capsule Take 1 capsule (300 mg total) by mouth 2 (two) times daily. (Patient not taking: Reported on 12/18/2016) 14 capsule 0 Completed Course at Unknown time    Assessment: 66yo female on chronic Coumadin for h/o afib.  Home dose listed above.  INR now below goal, no bleeding noted.   Goal of Therapy:  INR 2-3 Monitor platelets by anticoagulation protocol: Yes   Plan:  Coumadin 6mg  today. INR daily Monitor for signs and symptoms of bleeding.   Biagio Quint R 12/20/2016,11:16 AM

## 2016-12-20 NOTE — Care Management Important Message (Signed)
Important Message  Patient Details  Name: Sarah [name] MRN: [id number] Date of Birth: [birth year]   Medicare Important Message Given:  Yes    Estus Krakowski, Chauncey Reading, RN 12/20/2016, 9:37 AM

## 2016-12-20 NOTE — Care Management (Signed)
Patient qualifies for oxygen, desats to 84% on room air. Has Hx of Squamous cell carcinoma of lung and pneumonia. IV antibiotics have been tried and failed. Patient has chosen [facility] for oxygen supplier. Romualdo Bolk of [facility] notified and will obtain orders from chart.

## 2016-12-20 NOTE — Discharge Summary (Signed)
Physician Discharge Summary  Sarah [name] GYF:[id number] DOB: May 31, 1950 DOA: 12/18/2016  PCP: [name], MD  Admit date: 12/18/2016 Discharge date: 12/20/2016  Time spent: 45 minutes  Recommendations for Outpatient Follow-up:  -Will be discharged home today.  -Advised to follow-up with primary care provider in 2 weeks and with oncologist as scheduled. -Will need to complete a seven-day course of Levaquin for pneumonia. -Will need to be discharged on home oxygen given ambulating sats on room air of 84%.  Discharge Diagnoses:  Active Problems:   GERD (gastroesophageal reflux disease)   Warfarin anticoagulation   OSA (obstructive sleep apnea)   Long term current use of anticoagulant therapy   Atrial fibrillation, chronic (HCC)   Squamous cell carcinoma of lung, left (HCC)   Odynophagia   HCAP (healthcare-associated pneumonia)   Discharge Condition: Stable and improved  Filed Weights   12/18/16 1720 12/19/16 0030  Weight: 113.4 kg (250 lb) 114.2 kg (251 lb 12.3 oz)    History of present illness:  As per Dr. Marin Comment on 8/20: Sarah [name] is an 66 y.o. female with hx of SCC of the lung, undergoing chemotherapy with Dr Earlie Server, hx of afib and DVT on therapeutic coumadin, INR 2.45, GERD, sleep apnea with CPAP compliant, spinal stenosis, obesity, hx of odynophagia but has improved, presents to the ER with temp 101.4 at home, coughs, and mild SOB.  She was empirically placed on omicef.  Evaluation in the ER included a normal lactic acid, no leukocytosis, normal renal fx tests and CXR showed developing infiltrate.  She was admitted in June for nausea vomiting.  She was started on IV Van/Zosyn, BC were obtained, given supplemental oxygen with good Sat, and hospitalist was asked to admit her for HCAP.   Hospital Course:   Hospital-acquired pneumonia/acute hypoxemic respiratory failure -Blood cultures remain negative at day 2, she remains afebrile and nontoxic. -Antibiotics  will be transitioned over to Levaquin for which she will complete 7 more days upon discharge. -She does not use oxygen at home, however ambulating oxygen saturations on room air have been 84%, and as such will be discharged home on oxygen which I suspect will be temporary.  Rest of chronic conditions including atrial fibrillation and lung cancer have been stable.  Procedures:  None   Consultations:  None  Discharge Instructions  Discharge Instructions    Diet - low sodium heart healthy    Complete by:  As directed    For home use only DME oxygen    Complete by:  As directed    Liters per Minute:  2   Frequency:  Continuous (stationary and portable oxygen unit needed)   Oxygen delivery system:  Gas   Increase activity slowly    Complete by:  As directed      Allergies as of 12/20/2016      Reactions   Aspirin Nausea And Vomiting      Medication List    STOP taking these medications   cefdinir 300 MG capsule Commonly known as:  OMNICEF     TAKE these medications   buPROPion 150 MG 12 hr tablet Commonly known as:  WELLBUTRIN SR TAKE 1 TABLET BY MOUTH TWICE DAILY **KEEP APPT ON 06/09/16** What changed:  how much to take  how to take this  when to take this  additional instructions   calcium carbonate 500 MG chewable tablet Commonly known as:  TUMS - dosed in mg elemental calcium Chew 1 tablet by mouth 2 (  two) times a week.   diltiazem 180 MG 24 hr capsule Commonly known as:  CARDIZEM CD TAKE ONE CAPSULE BY MOUTH EVERY DAY   flecainide 50 MG tablet Commonly known as:  TAMBOCOR TAKE 1 TABLET (50 MG TOTAL) BY MOUTH 2 (TWO) TIMES DAILY.   HYDROcodone-acetaminophen 7.5-325 MG tablet Commonly known as:  NORCO One tablet up to four times a day as needed for pain   levofloxacin 750 MG tablet Commonly known as:  LEVAQUIN Take 1 tablet (750 mg total) by mouth daily.   ondansetron 4 MG disintegrating tablet Commonly known as:  ZOFRAN ODT Take 1 tablet by  mouth every 6 hours as needed for nausea.   pantoprazole 40 MG tablet Commonly known as:  PROTONIX TAKE 1 TABLET EVERY MORNING   promethazine 12.5 MG tablet Commonly known as:  PHENERGAN Take 1 tablet (12.5 mg total) by mouth every 6 (six) hours as needed for nausea or vomiting.   promethazine 25 MG tablet Commonly known as:  PHENERGAN Take 1 tablet (25 mg total) by mouth every 6 (six) hours as needed for nausea or vomiting.   sucralfate 1 g tablet Commonly known as:  CARAFATE Take 1 tablet (1 g total) by mouth 2 (two) times daily.   warfarin 5 MG tablet Commonly known as:  COUMADIN Take 2.5-5 mg by mouth daily. Patient takes 5mg  on Monday, Wednesday, and Saturday. One Tuesday, Thursday, Friday, and Sunday, patient takes 2.5mg            Durable Medical Equipment        Start     Ordered   12/20/16 0000  For home use only DME oxygen    Question Answer Comment  Liters per Minute 2   Frequency Continuous (stationary and portable oxygen unit needed)   Oxygen delivery system Gas      12/20/16 1332       Discharge Care Instructions        Start     Ordered   12/20/16 0000  levofloxacin (LEVAQUIN) 750 MG tablet  Daily     12/20/16 1332   12/20/16 0000  For home use only DME oxygen    Question Answer Comment  Liters per Minute 2   Frequency Continuous (stationary and portable oxygen unit needed)   Oxygen delivery system Gas      12/20/16 1332   12/20/16 0000  Increase activity slowly     12/20/16 1332   12/20/16 0000  Diet - low sodium heart healthy     08 /22/18 1332     Allergies  Allergen Reactions  . Aspirin Nausea And Vomiting   Follow-up Information    [name], MD. Schedule an appointment as soon as possible for a visit in 2 week(s).   Specialty:  Family Medicine Contact information: [address] [place] [address] [phone]            The results of significant diagnostics from this hospitalization (including  imaging, microbiology, ancillary and laboratory) are listed below for reference.    Significant Diagnostic Studies: Dg Chest Port 1 View  Result Date: 12/18/2016 CLINICAL DATA:  Fever and shortness of breath for 1 week. EXAM: PORTABLE CHEST 1 VIEW COMPARISON:  Chest radiograph 10/11/2016 FINDINGS: Area of increased opacity in the left mid lung. The masslike area is not apparent on the current study. No pleural effusion or pneumothorax. IMPRESSION: Left mid lung airspace opacities may indicate developing consolidation or may be secondary to radiation treatment.  The previously seen large lung mass is not visualized. Electronically Signed   By: Ulyses Jarred M.D.   On: 12/18/2016 18:35    Microbiology: Recent Results (from the past 240 hour(s))  Culture, blood (Routine X 2) w Reflex to ID Panel     Status: None (Preliminary result)   Collection Time: 12/18/16  6:08 PM  Result Value Ref Range Status   Specimen Description LEFT ANTECUBITAL  Final   Special Requests   Final    BOTTLES DRAWN AEROBIC AND ANAEROBIC Blood Culture adequate volume   Culture NO GROWTH 2 DAYS  Final   Report Status PENDING  Incomplete  Culture, blood (Routine X 2) w Reflex to ID Panel     Status: None (Preliminary result)   Collection Time: 12/18/16  6:08 PM  Result Value Ref Range Status   Specimen Description RIGHT ANTECUBITAL  Final   Special Requests   Final    BOTTLES DRAWN AEROBIC ONLY Blood Culture adequate volume   Culture NO GROWTH 2 DAYS  Final   Report Status PENDING  Incomplete     Labs: Basic Metabolic Panel:  Recent Labs Lab 12/18/16 1808 12/19/16 0744 12/20/16 0449  NA 138 139 142  K 3.4* 3.5 3.4*  CL 98* 101 101  CO2 30 29 31   GLUCOSE 119* 111* 93  BUN 8 7 7   CREATININE 0.80 0.86 0.80  CALCIUM 9.1 8.8* 8.7*   Liver Function Tests:  Recent Labs Lab 12/18/16 1808 12/20/16 0449  AST 24 18  ALT 18 15  ALKPHOS 106 91  BILITOT 0.3 0.6  PROT 7.5 6.6  ALBUMIN 3.2* 2.8*   No results  for input(s): LIPASE, AMYLASE in the last 168 hours. No results for input(s): AMMONIA in the last 168 hours. CBC:  Recent Labs Lab 12/18/16 1808 12/19/16 0744 12/20/16 0449  WBC 6.2 6.0 5.0  NEUTROABS 4.7  --   --   HGB 11.3* 10.4* 10.6*  HCT 35.7* 32.3* 32.8*  MCV 91.8 91.5 92.1  PLT 185 180 190   Cardiac Enzymes: No results for input(s): CKTOTAL, CKMB, CKMBINDEX, TROPONINI in the last 168 hours. BNP: BNP (last 3 results) No results for input(s): BNP in the last 8760 hours.  ProBNP (last 3 results) No results for input(s): PROBNP in the last 8760 hours.  CBG: No results for input(s): GLUCAP in the last 168 hours.     SignedLelon Frohlich  [facility] Pager: [phone] 12/20/2016, 1:32 PM

## 2016-12-20 NOTE — Telephone Encounter (Signed)
Oncology Nurse Navigator Documentation  Oncology Nurse Navigator Flowsheets 12/20/2016  Navigator Location [facility]  Referral date to RadOnc/MedOnc -  Navigator Encounter Type Telephone/I received vm message that Sarah [name] is in the hospital and was following up from my call.  I called her and she would like her CT scan at [facility].  I called central scheduling and was given a date and time. I called Sarah [name] backed and update appt and pre-procedure instructions.   Telephone Outgoing Call  Treatment Phase Follow-up  Barriers/Navigation Needs Coordination of Care  Interventions Coordination of Care  Coordination of Care Other  Acuity Level 2  Time Spent with Patient 45

## 2016-12-20 NOTE — Progress Notes (Signed)
SATURATION QUALIFICATIONS: (This note is used to comply with regulatory documentation for home oxygen)  Patient Saturations on Room Air at Rest = 84%  Patient Saturations on Room Air while Ambulating = 83%  Patient Saturations on 1 Liters of oxygen while Ambulating = 90%  Please briefly explain why patient needs home oxygen:

## 2016-12-23 LAB — CULTURE, BLOOD (ROUTINE X 2)
Culture: NO GROWTH
Culture: NO GROWTH
SPECIAL REQUESTS: ADEQUATE
Special Requests: ADEQUATE

## 2016-12-25 ENCOUNTER — Ambulatory Visit (HOSPITAL_COMMUNITY)
Admit: 2016-12-25 | Discharge: 2016-12-25 | Disposition: A | Discharge status: Home / Self Care | Payer: Medicare Other | Attending: Nurse Practitioner | Admitting: Nurse Practitioner

## 2016-12-25 ENCOUNTER — Other Ambulatory Visit: Payer: Self-pay | Admitting: Medical Oncology

## 2016-12-25 DIAGNOSIS — C3432 Malignant neoplasm of lower lobe, left bronchus or lung: Secondary | ICD-10-CM | POA: Diagnosis not present

## 2016-12-25 DIAGNOSIS — C3492 Malignant neoplasm of unspecified part of left bronchus or lung: Secondary | ICD-10-CM

## 2016-12-25 MED ORDER — IOPAMIDOL (ISOVUE-300) INJECTION 61%
75.0000 mL | Freq: Once | INTRAVENOUS | Status: AC | PRN
  Administered 2016-12-25: 75 mL via INTRAVENOUS

## 2016-12-26 ENCOUNTER — Ambulatory Visit (HOSPITAL_BASED_OUTPATIENT_CLINIC_OR_DEPARTMENT_OTHER): Payer: Medicare Other | Admitting: Internal Medicine

## 2016-12-26 ENCOUNTER — Other Ambulatory Visit (HOSPITAL_BASED_OUTPATIENT_CLINIC_OR_DEPARTMENT_OTHER): Payer: Medicare Other

## 2016-12-26 ENCOUNTER — Other Ambulatory Visit: Payer: Self-pay | Admitting: Internal Medicine

## 2016-12-26 ENCOUNTER — Telehealth: Payer: Self-pay | Admitting: Internal Medicine

## 2016-12-26 ENCOUNTER — Encounter: Payer: Self-pay | Admitting: Internal Medicine

## 2016-12-26 VITALS — BP 112/44 | HR 106 | Temp 98.8°F | Resp 18 | Ht 67.0 in | Wt 241.6 lb

## 2016-12-26 DIAGNOSIS — C3432 Malignant neoplasm of lower lobe, left bronchus or lung: Secondary | ICD-10-CM

## 2016-12-26 DIAGNOSIS — Z7189 Other specified counseling: Secondary | ICD-10-CM

## 2016-12-26 DIAGNOSIS — J189 Pneumonia, unspecified organism: Secondary | ICD-10-CM

## 2016-12-26 DIAGNOSIS — R509 Fever, unspecified: Secondary | ICD-10-CM | POA: Diagnosis not present

## 2016-12-26 DIAGNOSIS — T451X5A Adverse effect of antineoplastic and immunosuppressive drugs, initial encounter: Secondary | ICD-10-CM

## 2016-12-26 DIAGNOSIS — C3492 Malignant neoplasm of unspecified part of left bronchus or lung: Secondary | ICD-10-CM

## 2016-12-26 DIAGNOSIS — R110 Nausea: Secondary | ICD-10-CM | POA: Diagnosis not present

## 2016-12-26 DIAGNOSIS — J700 Acute pulmonary manifestations due to radiation: Secondary | ICD-10-CM

## 2016-12-26 DIAGNOSIS — R630 Anorexia: Secondary | ICD-10-CM

## 2016-12-26 DIAGNOSIS — Z5111 Encounter for antineoplastic chemotherapy: Secondary | ICD-10-CM

## 2016-12-26 LAB — COMPREHENSIVE METABOLIC PANEL
ALT: 12 U/L (ref 0–55)
AST: 16 U/L (ref 5–34)
Albumin: 2.5 g/dL — ABNORMAL LOW (ref 3.5–5.0)
Alkaline Phosphatase: 123 U/L (ref 40–150)
Anion Gap: 12 mEq/L — ABNORMAL HIGH (ref 3–11)
BUN: 5.4 mg/dL — ABNORMAL LOW (ref 7.0–26.0)
CHLORIDE: 95 meq/L — AB (ref 98–109)
CO2: 33 meq/L — AB (ref 22–29)
CREATININE: 0.7 mg/dL (ref 0.6–1.1)
Calcium: 9.9 mg/dL (ref 8.4–10.4)
EGFR: 85 mL/min/{1.73_m2} — ABNORMAL LOW (ref >90)
Glucose: 109 mg/dl (ref 70–140)
Potassium: 3.1 mEq/L — ABNORMAL LOW (ref 3.5–5.1)
SODIUM: 140 meq/L (ref 136–145)
TOTAL PROTEIN: 7.3 g/dL (ref 6.4–8.3)
Total Bilirubin: 0.33 mg/dL (ref 0.20–1.20)

## 2016-12-26 LAB — CBC WITH DIFFERENTIAL/PLATELET
BASO%: 0.2 % (ref 0.0–2.0)
Basophils Absolute: 0 10*3/uL (ref 0.0–0.1)
EOS%: 1.7 % (ref 0.0–7.0)
Eosinophils Absolute: 0.1 10*3/uL (ref 0.0–0.5)
HCT: 32.6 % — ABNORMAL LOW (ref 34.8–46.6)
HGB: 10.4 g/dL — ABNORMAL LOW (ref 11.6–15.9)
LYMPH%: 6.8 % — AB (ref 14.0–49.7)
MCH: 29.9 pg (ref 25.1–34.0)
MCHC: 31.9 g/dL (ref 31.5–36.0)
MCV: 93.7 fL (ref 79.5–101.0)
MONO#: 0.5 10*3/uL (ref 0.1–0.9)
MONO%: 7.9 % (ref 0.0–14.0)
NEUT%: 83.4 % — AB (ref 38.4–76.8)
NEUTROS ABS: 5.4 10*3/uL (ref 1.5–6.5)
Platelets: 185 10*3/uL (ref 145–400)
RBC: 3.48 10*6/uL — AB (ref 3.70–5.45)
RDW: 24.4 % — ABNORMAL HIGH (ref 11.2–14.5)
WBC: 6.5 10*3/uL (ref 3.9–10.3)
lymph#: 0.4 10*3/uL — ABNORMAL LOW (ref 0.9–3.3)

## 2016-12-26 MED ORDER — POTASSIUM CHLORIDE CRYS ER 20 MEQ PO TBCR: 20.0000 meq | EXTENDED_RELEASE_TABLET | Freq: Once | ORAL | 0 refills | Status: DC

## 2016-12-26 MED ORDER — PROCHLORPERAZINE MALEATE 10 MG PO TABS: 10.0000 mg | ORAL_TABLET | Freq: Four times a day (QID) | ORAL | 0 refills | Status: DC | PRN

## 2016-12-26 MED ORDER — METHYLPREDNISOLONE 4 MG PO TBPK: ORAL_TABLET | ORAL | 0 refills | Status: DC

## 2016-12-26 MED ORDER — DOXYCYCLINE HYCLATE 100 MG PO TABS: 100.0000 mg | ORAL_TABLET | Freq: Two times a day (BID) | ORAL | 0 refills | Status: DC

## 2016-12-26 NOTE — Telephone Encounter (Signed)
Gave patient avs and calendar with upcoming appts.  °

## 2016-12-26 NOTE — Progress Notes (Signed)
[place] Telephone:([phone]) [phone]   [phone]) [phone]  OFFICE PROGRESS NOTE  [name], MD [address] [place] [address]  DIAGNOSIS: Unresectable stage IIIA (T4, N0, M0) non-small cell lung cancer, squamous cell carcinoma diagnosed in May 2018 and presented with large obstructing left lower lobe lung mass suspicious small mediastinal lymphadenopathy.  PRIOR THERAPY: None.  CURRENT THERAPY: A course of concurrent chemoradiation with weekly carboplatin for AUC of 2 and paclitaxel 45 MG/M2. Status post 5 cycles. She received her radiation in [facility]. Last dose of chemotherapy was given 11/27/2016.  INTERVAL HISTORY: Sarah [name] 66 y.o. female returns to the clinic today for follow-up visit accompanied by her husband. The patient was recently discharged from [facility] after being treated for pneumonia. She continues to complain of fatigue and weakness as well as shortness breath and cough productive of yellowish sputum. She is finishing a course of outpatient Levaquin. She denied having any hemoptysis. She denied having any headache or visual changes. She continues to have nausea at regular basis. She also has low-grade fever on daily basis especially in the morning. She tolerated the previous course of concurrent chemoradiation well except for fatigue and shortness of breath as well as mild odynophagia. She had repeat CT scan of the chest performed yesterday and she is here for evaluation and discussion of her scan results and treatment options.  MEDICAL HISTORY: Past Medical History:  Diagnosis Date  . Arthritis   . Cancer ([place])    lung cancer  . Chronic anxiety    Patient describes as stress  . Chronic back pain   . DVT (deep venous thrombosis) ([place]) age 1  . GERD (gastroesophageal reflux disease)   . Goals of care, counseling/discussion 09/21/2016  . Headache(784.0)   . Hypoestrogenism   . Lower extremity venous  stasis    LLE, chronic  . Obesity   . Odynophagia 11/13/2016  . Paroxysmal atrial fibrillation ([facility])   . PONV (postoperative nausea and vomiting)   . Pulmonary embolism ([place])   . Reflux   . Sleep apnea    wear CPAP  . Spinal stenosis   . Tobacco abuse   . Venous stasis   . Warfarin anticoagulation     ALLERGIES:  is allergic to aspirin.  MEDICATIONS:  Current Outpatient Prescriptions  Medication Sig Dispense Refill  . buPROPion (WELLBUTRIN SR) 150 MG 12 hr tablet TAKE 1 TABLET BY MOUTH TWICE DAILY **KEEP APPT ON 06/09/16** (Patient taking differently: Take 150 mg by mouth daily. TAKE 1 TABLET BY MOUTH TWICE DAILY **KEEP APPT ON 06/09/16**) 180 tablet 0  . calcium carbonate (TUMS - DOSED IN MG ELEMENTAL CALCIUM) 500 MG chewable tablet Chew 1 tablet by mouth 2 (two) times a week.     . diltiazem (CARDIZEM CD) 180 MG 24 hr capsule TAKE ONE CAPSULE BY MOUTH EVERY DAY 90 capsule 1  . flecainide (TAMBOCOR) 50 MG tablet TAKE 1 TABLET (50 MG TOTAL) BY MOUTH 2 (TWO) TIMES DAILY. 60 tablet 6  . HYDROcodone-acetaminophen (NORCO) 7.5-325 MG tablet One tablet up to four times a day as needed for pain 60 tablet 0  . levofloxacin (LEVAQUIN) 750 MG tablet Take 1 tablet (750 mg total) by mouth daily. 7 tablet 0  . ondansetron (ZOFRAN ODT) 4 MG disintegrating tablet Take 1 tablet by mouth every 6 hours as needed for nausea. 21 tablet 0  . pantoprazole (PROTONIX) 40 MG tablet TAKE 1 TABLET  EVERY MORNING 90 tablet 1  . promethazine (PHENERGAN) 12.5 MG tablet Take 1 tablet (12.5 mg total) by mouth every 6 (six) hours as needed for nausea or vomiting. 30 tablet 0  . sucralfate (CARAFATE) 1 g tablet Take 1 tablet (1 g total) by mouth 2 (two) times daily. 60 tablet 0  . warfarin (COUMADIN) 5 MG tablet Take 2.5-5 mg by mouth daily. Patient takes 5mg  on Monday, Wednesday, and Saturday. One Tuesday, Thursday, Friday, and Sunday, patient takes 2.5mg      No current facility-administered medications for this  visit.     SURGICAL HISTORY:  Past Surgical History:  Procedure Laterality Date  . ANKLE SURGERY     x2  . APPENDECTOMY    . CARPAL TUNNEL RELEASE    . CHOLECYSTECTOMY    . OVARIAN CYST REMOVAL    . ROTATOR CUFF REPAIR    . TUBAL LIGATION    . VIDEO BRONCHOSCOPY WITH ENDOBRONCHIAL ULTRASOUND N/A 09/14/2016   Procedure: VIDEO BRONCHOSCOPY WITH ENDOBRONCHIAL ULTRASOUND;  Surgeon: Melrose Nakayama, MD;  Location: [place];  Service: Thoracic;  Laterality: N/A;    REVIEW OF SYSTEMS:  Constitutional: positive for anorexia, fatigue and weight loss Eyes: negative Ears, nose, mouth, throat, and face: negative Respiratory: positive for cough, dyspnea on exertion and sputum Cardiovascular: negative Gastrointestinal: negative Genitourinary:negative Integument/breast: negative Hematologic/lymphatic: negative Musculoskeletal:negative Neurological: negative Behavioral/Psych: negative Endocrine: negative Allergic/Immunologic: negative   PHYSICAL EXAMINATION: General appearance: alert, cooperative, fatigued and no distress Head: Normocephalic, without obvious abnormality, atraumatic Neck: no adenopathy, no JVD, supple, symmetrical, trachea midline and thyroid not enlarged, symmetric, no tenderness/mass/nodules Lymph nodes: Cervical, supraclavicular, and axillary nodes normal. Resp: diminished breath sounds LLL and wheezes LLL Back: symmetric, no curvature. ROM normal. No CVA tenderness. Cardio: regular rate and rhythm, S1, S2 normal, no murmur, click, rub or gallop GI: soft, non-tender; bowel sounds normal; no masses,  no organomegaly Extremities: extremities normal, atraumatic, no cyanosis or edema Neurologic: Alert and oriented X 3, normal strength and tone. Normal symmetric reflexes. Normal coordination and gait  ECOG PERFORMANCE STATUS: 2 - Symptomatic, <50% confined to bed  Blood pressure (!) 112/44, pulse (!) 106, temperature 98.8 F (37.1 C), temperature source Oral, resp. rate  18, height 5\' 7"  (1.702 m), weight 241 lb 9.6 oz (109.6 kg), SpO2 95 %.  LABORATORY DATA: Lab Results  Component Value Date   WBC 6.5 12/26/2016   HGB 10.4 (L) 12/26/2016   HCT 32.6 (L) 12/26/2016   MCV 93.7 12/26/2016   PLT 185 12/26/2016      Chemistry      Component Value Date/Time   NA 140 12/26/2016 0928   K 3.1 (L) 12/26/2016 0928   CL 101 12/20/2016 0449   CO2 33 (H) 12/26/2016 0928   BUN 5.4 (L) 12/26/2016 0928   CREATININE 0.7 12/26/2016 0928      Component Value Date/Time   CALCIUM 9.9 12/26/2016 0928   ALKPHOS 123 12/26/2016 0928   AST 16 12/26/2016 0928   ALT 12 12/26/2016 0928   BILITOT 0.33 12/26/2016 0928       RADIOGRAPHIC STUDIES: Ct Chest W Contrast  Result Date: 12/26/2016 CLINICAL DATA:  Stage IIIA unresectable squamous cell left lower lobe lung carcinoma diagnosed May 2018 status post concurrent chemoradiation therapy. Restaging. EXAM: CT CHEST WITH CONTRAST TECHNIQUE: Multidetector CT imaging of the chest was performed during intravenous contrast administration. CONTRAST:  63mL ISOVUE-300 IOPAMIDOL (ISOVUE-300) INJECTION 61% COMPARISON:  09/27/2016 simulation chest CT. 09/13/2016 PET-CT. 08/30/2016 diagnostic chest CT. FINDINGS:  Motion degraded scan. Cardiovascular: Normal heart size. No significant pericardial fluid/thickening. Mildly atherosclerotic nonaneurysmal thoracic aorta. Stable top-normal caliber main pulmonary artery (3.2 cm diameter). No central pulmonary emboli. Mediastinum/Nodes: No discrete thyroid nodules. Unremarkable esophagus. No pathologically enlarged axillary, mediastinal or hilar lymph nodes. Lungs/Pleura: No pneumothorax. No right pleural effusion. Small dependent left pleural effusion, mildly increased. Superior segment left lower lobe 6.9 x 4.4 cm lung mass (series 2/ image 63), decreased from 9.1 x 7.9 cm on 08/30/2016 using similar measurement technique. Persistent occlusion of the left lower lobe bronchus with complete left lower  lobe collapse. Tiny cortical erosion of the anteromedial left seventh rib (series 4/ image 69) appears stable since 09/27/2016. New patchy ground-glass attenuation throughout the left upper lobe and throughout the medial right lung. Moderate centrilobular emphysema. No additional significant pulmonary nodules. Upper abdomen: Cholecystectomy. Stable 3.0 cm right adrenal adenoma. Musculoskeletal: No aggressive appearing focal osseous lesions. Moderate thoracic spondylosis. IMPRESSION: 1. Left lower lobe lung mass is decreased in size, now 6.9 x 4.4 cm. Persistent occlusion of the left lower lobe bronchus with complete left lower lobe collapse. Stable minimal invasion of the posteromedial left seventh rib by the primary tumor. 2. No adenopathy or other findings of metastatic disease in the chest. 3. Small dependent left pleural effusion, mildly increased . 4. New patchy ground-glass attenuation throughout the left upper lobe and medial right lung, favor radiation pneumonitis. Aortic Atherosclerosis (ICD10-I70.0) and Emphysema (ICD10-J43.9). Electronically Signed   By: Ilona Sorrel M.D.   On: 12/26/2016 09:09   Dg Chest Port 1 View  Result Date: 12/18/2016 CLINICAL DATA:  Fever and shortness of breath for 1 week. EXAM: PORTABLE CHEST 1 VIEW COMPARISON:  Chest radiograph 10/11/2016 FINDINGS: Area of increased opacity in the left mid lung. The masslike area is not apparent on the current study. No pleural effusion or pneumothorax. IMPRESSION: Left mid lung airspace opacities may indicate developing consolidation or may be secondary to radiation treatment. The previously seen large lung mass is not visualized. Electronically Signed   By: Ulyses Jarred M.D.   On: 12/18/2016 18:35    ASSESSMENT AND PLAN:  This is a very pleasant 66 years old with unresectable a stage IIIa non-small cell lung cancer. The patient underwent a course of concurrent chemoradiation with weekly carboplatin and paclitaxel status post 8 cycles  and has been tolerating this treatment fairly well except for mild fatigue and odynophagia. She was recently treated for pneumonia at [facility] with Zosyn and vancomycin followed by outpatient treatment with Levaquin. She is not feeling well yet. She had a recent CT scan of the chest for restaging of her disease. I personally and independently reviewed the scan images and discuss the results with the patient and her husband and showed them the images. Her scan showed improvement of her disease but there are new patchy groundglass attenuation in the left upper lobe and medial right lung questionable for radiation pneumonitis but inflammatory process cannot be excluded. I had a lengthy discussion with the patient today about her current condition and treatment options. I would have recommended for the patient treatment with consolidation immunotherapy with Imfinzi (Durvalumab) but with her current condition and I will delay this discussion by a few more weeks until improvement of her condition. For the questionable pneumonia, I will start the patient on doxycycline 100 mg by mouth twice a day for 10 more days. For the lack of appetite and radiation pneumonitis, I will start the patient on Medrol Dosepak. For  the persistent nausea, I started the patient on Compazine 10 mg by mouth every 6 hours as needed for nausea. I will see the patient back for follow-up visit in 3 weeks for reevaluation and more detailed discussion of her treatment options. She was advised to call immediately if she has any concerning symptoms in the interval. The patient voices understanding of current disease status and treatment options and is in agreement with the current care plan. All questions were answered. The patient knows to call the clinic with any problems, questions or concerns. We can certainly see the patient much sooner if necessary.  Disclaimer: This note was dictated with voice recognition software.  Similar sounding words can inadvertently be transcribed and may not be corrected upon review.

## 2016-12-28 ENCOUNTER — Ambulatory Visit: Payer: Medicare Other

## 2016-12-28 ENCOUNTER — Ambulatory Visit: Payer: Medicare Other | Admitting: Family Medicine

## 2017-01-03 DIAGNOSIS — C3492 Malignant neoplasm of unspecified part of left bronchus or lung: Secondary | ICD-10-CM | POA: Diagnosis not present

## 2017-01-03 DIAGNOSIS — C3432 Malignant neoplasm of lower lobe, left bronchus or lung: Secondary | ICD-10-CM | POA: Diagnosis not present

## 2017-01-07 ENCOUNTER — Other Ambulatory Visit: Payer: Self-pay | Admitting: Family Medicine

## 2017-01-08 ENCOUNTER — Telehealth: Payer: Self-pay | Admitting: Medical Oncology

## 2017-01-08 DIAGNOSIS — C3490 Malignant neoplasm of unspecified part of unspecified bronchus or lung: Secondary | ICD-10-CM

## 2017-01-08 MED ORDER — METHYLPREDNISOLONE 4 MG PO TBPK: ORAL_TABLET | ORAL | 0 refills | Status: DC

## 2017-01-08 NOTE — Telephone Encounter (Signed)
No fever , but still having night sweats and sometimes during day . She does not have any energy , not breathing as well. She finished antibiotics and prednisone last wed. She is on 2 liters of Ramireno and pulse ox sitting at rest 93%. She had episode last night where suddenly she could not catch her breath.

## 2017-01-08 NOTE — Telephone Encounter (Signed)
Per Julien Nordmann I called in medrol dose pack . Pt states she has never been told she has COPD and has not seen a pulmonologist.

## 2017-01-15 DIAGNOSIS — C3492 Malignant neoplasm of unspecified part of left bronchus or lung: Secondary | ICD-10-CM | POA: Diagnosis not present

## 2017-01-15 DIAGNOSIS — C3432 Malignant neoplasm of lower lobe, left bronchus or lung: Secondary | ICD-10-CM | POA: Diagnosis not present

## 2017-01-16 ENCOUNTER — Encounter: Payer: Self-pay | Admitting: Oncology

## 2017-01-16 ENCOUNTER — Other Ambulatory Visit (HOSPITAL_BASED_OUTPATIENT_CLINIC_OR_DEPARTMENT_OTHER): Payer: Medicare Other

## 2017-01-16 ENCOUNTER — Ambulatory Visit (HOSPITAL_BASED_OUTPATIENT_CLINIC_OR_DEPARTMENT_OTHER): Payer: Medicare Other | Admitting: Oncology

## 2017-01-16 ENCOUNTER — Telehealth: Payer: Self-pay | Admitting: Oncology

## 2017-01-16 ENCOUNTER — Ambulatory Visit: Payer: Medicare Other

## 2017-01-16 VITALS — BP 105/72 | HR 68 | Temp 98.0°F | Resp 17 | Wt 238.2 lb

## 2017-01-16 DIAGNOSIS — C3492 Malignant neoplasm of unspecified part of left bronchus or lung: Secondary | ICD-10-CM

## 2017-01-16 DIAGNOSIS — C3432 Malignant neoplasm of lower lobe, left bronchus or lung: Secondary | ICD-10-CM

## 2017-01-16 DIAGNOSIS — T451X5A Adverse effect of antineoplastic and immunosuppressive drugs, initial encounter: Secondary | ICD-10-CM

## 2017-01-16 DIAGNOSIS — R05 Cough: Secondary | ICD-10-CM | POA: Diagnosis not present

## 2017-01-16 DIAGNOSIS — J189 Pneumonia, unspecified organism: Secondary | ICD-10-CM

## 2017-01-16 DIAGNOSIS — R0602 Shortness of breath: Secondary | ICD-10-CM

## 2017-01-16 DIAGNOSIS — Z7189 Other specified counseling: Secondary | ICD-10-CM

## 2017-01-16 DIAGNOSIS — R110 Nausea: Secondary | ICD-10-CM

## 2017-01-16 DIAGNOSIS — Z5111 Encounter for antineoplastic chemotherapy: Secondary | ICD-10-CM

## 2017-01-16 LAB — CBC WITH DIFFERENTIAL/PLATELET
BASO%: 0.1 % (ref 0.0–2.0)
BASOS ABS: 0 10*3/uL (ref 0.0–0.1)
EOS%: 0.1 % (ref 0.0–7.0)
Eosinophils Absolute: 0 10*3/uL (ref 0.0–0.5)
HEMATOCRIT: 38.4 % (ref 34.8–46.6)
HEMOGLOBIN: 12.1 g/dL (ref 11.6–15.9)
LYMPH#: 0.6 10*3/uL — AB (ref 0.9–3.3)
LYMPH%: 4.7 % — ABNORMAL LOW (ref 14.0–49.7)
MCH: 30.8 pg (ref 25.1–34.0)
MCHC: 31.5 g/dL (ref 31.5–36.0)
MCV: 97.7 fL (ref 79.5–101.0)
MONO#: 0.2 10*3/uL (ref 0.1–0.9)
MONO%: 1.5 % (ref 0.0–14.0)
NEUT#: 11.1 10*3/uL — ABNORMAL HIGH (ref 1.5–6.5)
NEUT%: 93.6 % — ABNORMAL HIGH (ref 38.4–76.8)
Platelets: 255 10*3/uL (ref 145–400)
RBC: 3.93 10*6/uL (ref 3.70–5.45)
RDW: 22.3 % — AB (ref 11.2–14.5)
WBC: 11.8 10*3/uL — ABNORMAL HIGH (ref 3.9–10.3)

## 2017-01-16 LAB — COMPREHENSIVE METABOLIC PANEL
ALBUMIN: 3 g/dL — AB (ref 3.5–5.0)
ALK PHOS: 112 U/L (ref 40–150)
ALT: 38 U/L (ref 0–55)
ANION GAP: 9 meq/L (ref 3–11)
AST: 16 U/L (ref 5–34)
BILIRUBIN TOTAL: 0.28 mg/dL (ref 0.20–1.20)
BUN: 13.9 mg/dL (ref 7.0–26.0)
CALCIUM: 10 mg/dL (ref 8.4–10.4)
CO2: 29 meq/L (ref 22–29)
CREATININE: 0.8 mg/dL (ref 0.6–1.1)
Chloride: 100 mEq/L (ref 98–109)
EGFR: 79 mL/min/{1.73_m2} — AB (ref >90)
GLUCOSE: 147 mg/dL — AB (ref 70–140)
Potassium: 4.4 mEq/L (ref 3.5–5.1)
Sodium: 138 mEq/L (ref 136–145)
Total Protein: 7.1 g/dL (ref 6.4–8.3)

## 2017-01-16 LAB — TECHNOLOGIST REVIEW: Technologist Review: 1

## 2017-01-16 NOTE — Progress Notes (Signed)
[facility] Follow up:    Sarah Kirschner, MD [place] [address]   DIAGNOSIS: Unresectable stage IIIA(T4, N0, M0) non-small cell lung cancer, squamous cell carcinoma diagnosed in May 2018 and presented with large obstructing left lower lobe lung mass suspicious small mediastinal lymphadenopathy.  SUMMARY OF ONCOLOGIC HISTORY:  No history exists.   PRIOR THERAPY: A course of concurrent chemoradiation with weekly carboplatin for AUC of 2 and paclitaxel 45 MG/M2. Status post 5 cycles. She received her radiation in [facility]. Last dose of chemotherapy was given 11/27/2016.  CURRENT THERAPY: under consideration to begin treatment with consolidation immunotherapy with Imfinzi (Durvalumab). Has not yet started due to pneumonia and steroid use.  INTERVAL HISTORY: Sarah [name] 66 y.o. female returns for routine follow-up with her husband. The patient was seen by radiation oncology yesterday and was placed on prednisone for approximately one month due to shortness of breath. She reports that her breathing is better and she is able to cough up some of the mucus that was in her lungs. She denies fevers and chills. Denies chest pain and hemoptysis. Denies nausea, vomiting, diarrhea, constipation. The patient is here to discuss treatment options for her lung cancer.   Patient Active Problem List   Diagnosis Date Noted  . HCAP (healthcare-associated pneumonia) 12/18/2016  . Odynophagia 11/13/2016  . Chemotherapy induced nausea and vomiting 10/12/2016  . Chemotherapy-induced nausea 10/12/2016  . Goals of care, counseling/discussion 09/21/2016  . Encounter for antineoplastic chemotherapy 09/21/2016  . Squamous cell carcinoma of lung, left ([place]) 09/20/2016  . Lung mass 09/15/2016  . Arthritis 06/23/2013  . Atrial fibrillation, chronic ([place]) 08/30/2012  . Obesity, unspecified 08/30/2012  . Hypokalemia 08/30/2012  . Long term current use  of anticoagulant therapy 08/03/2012  . OSA (obstructive sleep apnea) 05/23/2012  . History of pulmonary embolism 12/28/2011  . Tobacco abuse 12/28/2011  . Chronic anxiety 12/28/2011  . GERD (gastroesophageal reflux disease) 12/28/2011  . Warfarin anticoagulation 12/28/2011    is allergic to aspirin.  MEDICAL HISTORY: Past Medical History:  Diagnosis Date  . Arthritis   . Cancer ([place])    lung cancer  . Chronic anxiety    Patient describes as stress  . Chronic back pain   . DVT (deep venous thrombosis) ([place]) age 37  . GERD (gastroesophageal reflux disease)   . Goals of care, counseling/discussion 09/21/2016  . Headache(784.0)   . Hypoestrogenism   . Lower extremity venous stasis    LLE, chronic  . Obesity   . Odynophagia 11/13/2016  . Paroxysmal atrial fibrillation ([facility])   . PONV (postoperative nausea and vomiting)   . Pulmonary embolism ([place])   . Reflux   . Sleep apnea    wear CPAP  . Spinal stenosis   . Tobacco abuse   . Venous stasis   . Warfarin anticoagulation     SURGICAL HISTORY: Past Surgical History:  Procedure Laterality Date  . ANKLE SURGERY     x2  . APPENDECTOMY    . CARPAL TUNNEL RELEASE    . CHOLECYSTECTOMY    . OVARIAN CYST REMOVAL    . ROTATOR CUFF REPAIR    . TUBAL LIGATION    . VIDEO BRONCHOSCOPY WITH ENDOBRONCHIAL ULTRASOUND N/A 09/14/2016   Procedure: VIDEO BRONCHOSCOPY WITH ENDOBRONCHIAL ULTRASOUND;  Surgeon: Melrose Nakayama, MD;  Location: [facility];  Service: Thoracic;  Laterality: N/A;    SOCIAL HISTORY: Social History   Social History  . Marital  status: Married    Spouse name: N/A  . Number of children: N/A  . Years of education: N/A   Occupational History  . EMT     Volunteer  . Dialysis Tech     Retired   Social History Main Topics  . Smoking status: Former Smoker    Packs/day: 1.00    Years: 40.00    Types: Cigarettes    Quit date: 08/29/2016  . Smokeless tobacco: Never Used  . Alcohol use No  . Drug use: No  .  Sexual activity: Not on file   Other Topics Concern  . Not on file   Social History Narrative  . No narrative on file    FAMILY HISTORY: Family History  Problem Relation Age of Onset  . Arrhythmia Mother   . Cancer Mother        colon  . Heart disease Mother   . Stroke Father        Deceased    Review of Systems  Constitutional: Positive for fatigue. Negative for appetite change, chills, fever and unexpected weight change.  HENT:  Negative.   Eyes: Negative.   Respiratory: Positive for cough and shortness of breath. Negative for chest tightness, hemoptysis and wheezing.   Cardiovascular: Negative.   Gastrointestinal: Negative.   Genitourinary: Negative.    Musculoskeletal: Negative.   Skin: Negative.   Neurological: Negative.   Hematological: Negative.   Psychiatric/Behavioral: Negative.       PHYSICAL EXAMINATION  ECOG PERFORMANCE STATUS: 2 - Symptomatic, <50% confined to bed  Vitals:   01/16/17 0947  BP: 105/72  Pulse: 68  Resp: 17  Temp: 98 F (36.7 C)  SpO2: 98%    Physical Exam  Constitutional: She is oriented to person, place, and time and well-developed, well-nourished, and in no distress. No distress.  HENT:  Head: Normocephalic.  Mouth/Throat: Oropharynx is clear and moist. No oropharyngeal exudate.  Eyes: Conjunctivae are normal. Right eye exhibits no discharge. Left eye exhibits no discharge. No scleral icterus.  Neck: Normal range of motion. Neck supple.  Cardiovascular: Normal rate, regular rhythm, normal heart sounds and intact distal pulses.   Pulmonary/Chest: Effort normal. No respiratory distress. She has no wheezes.  Diminished breath sounds bilaterally.  Abdominal: Soft. Bowel sounds are normal. She exhibits no distension and no mass. There is no tenderness.  Musculoskeletal: Normal range of motion. She exhibits no edema.  Lymphadenopathy:    She has no cervical adenopathy.  Neurological: She is alert and oriented to person, place,  and time. She exhibits normal muscle tone.  Skin: Skin is warm and dry. No rash noted. She is not diaphoretic. No erythema. No pallor.  Psychiatric: Mood, memory, affect and judgment normal.  Vitals reviewed.   LABORATORY DATA:  CBC    Component Value Date/Time   WBC 11.8 (H) 01/16/2017 0928   WBC 5.0 12/20/2016 0449   RBC 3.93 01/16/2017 0928   RBC 3.56 (L) 12/20/2016 0449   HGB 12.1 01/16/2017 0928   HCT 38.4 01/16/2017 0928   PLT 255 01/16/2017 0928   PLT 180 12/12/2016 1429   MCV 97.7 01/16/2017 0928   MCH 30.8 01/16/2017 0928   MCH 29.8 12/20/2016 0449   MCHC 31.5 01/16/2017 0928   MCHC 32.3 12/20/2016 0449   RDW 22.3 (H) 01/16/2017 0928   LYMPHSABS 0.6 (L) 01/16/2017 0928   MONOABS 0.2 01/16/2017 0928   EOSABS 0.0 01/16/2017 0928   EOSABS 0.1 12/12/2016 1429   BASOSABS 0.0 01/16/2017 1093  CMP     Component Value Date/Time   NA 138 01/16/2017 0928   K 4.4 01/16/2017 0928   CL 101 12/20/2016 0449   CO2 29 01/16/2017 0928   GLUCOSE 147 (H) 01/16/2017 0928   BUN 13.9 01/16/2017 0928   CREATININE 0.8 01/16/2017 0928   CALCIUM 10.0 01/16/2017 0928   PROT 7.1 01/16/2017 0928   ALBUMIN 3.0 (L) 01/16/2017 0928   AST 16 01/16/2017 0928   ALT 38 01/16/2017 0928   ALKPHOS 112 01/16/2017 0928   BILITOT 0.28 01/16/2017 0928   GFRNONAA >60 12/20/2016 0449   GFRAA >60 12/20/2016 0449    RADIOGRAPHIC STUDIES:  No results found.  ASSESSMENT and THERAPY PLAN:   Squamous cell carcinoma of lung, left ([facility]) This is a very pleasant 66 year old with unresectable a stage IIIa non-small cell lung cancer. The patient underwent a course of concurrent chemoradiation with weekly carboplatin and paclitaxel status post 8 cycles and tolerated this treatment fairly well except for mild fatigue and odynophagia. She was recently treated for pneumonia at [facility] with Zosyn and vancomycin followed by outpatient treatment with Levaquin followed by doxycycline. She had a  restaging CT scan of the chest. Her scan showed improvement of her disease but there are new patchy groundglass attenuation in the left upper lobe and medial right lung questionable for radiation pneumonitis but inflammatory process cannot be excluded.  The patient was seen with Dr. Julien Nordmann. We again had a lengthy discussion with the patient today about her current condition and treatment options. I would have recommended for the patient treatment with consolidation immunotherapy with Imfinzi (Durvalumab) but with the patient currently taking prednisone, we will need to hold off on this. Once the patient's prednisone dose is down below 10 mg a day, we will consider starting treatment. Adverse effects of this treatment were discussed including but not limited to immune mediated the skin rash, diarrhea, inflammation of the lung, kidney, liver, thyroid or other endocrine dysfunction.  We will plan to see the patient back in 3-4 weeks to discuss treatment.  The patient inquired about her diagnosis COPD. Discussed with the patient and she is currently on treatment with prednisone which can help her COPD. Recommend that she talk with her primary care provider for ongoing management of COPD.  He patient will return back to the lab after the visit today to redraw her CMET since the first specimen hemolyzed.   Orders Placed This Encounter  Procedures  . CBC with Differential/Platelet    Standing Status:   Future    Number of Occurrences:   1    Standing Expiration Date:   01/16/2018  . Comprehensive metabolic panel    Standing Status:   Future    Number of Occurrences:   1    Standing Expiration Date:   01/16/2018    All questions were answered. The patient knows to call the clinic with any problems, questions or concerns. We can certainly see the patient much sooner if necessary.  Sarah Bussing, NP 01/16/2017   ADDENDUM: Hematology/Oncology Attending: I had a face to face encounter with the  patient. I recommended her care plan. This is a very pleasant 66 years old white female with a stage IIIa non-small cell lung cancer status post a course of concurrent chemoradiation with weekly carboplatin and paclitaxel. The patient rotated her treatment well except for the persistent cough and shortness of breath secondary to radiation induced pneumonitis. She has partial response to this treatment.  She was seen recently by Dr. Albin Felling at the [facility] in [place]. He started her on a tapering dose of prednisone. She felt a little bit better after her previous Medrol Dosepak. The patient is feeling a little bit better today but she continues to have the baseline shortness breath and cough. I recommended for the patient to hold starting the consolidation immunotherapy for now until her prednisone dose is 10 mg or less. We will arrange for the patient to come back for follow-up visit in few weeks for reevaluation and discussion of the consolidation immunotherapy once she is feeling better. She was advised to call immediately if she has any concerning symptoms in the interval. Disclaimer: This note was dictated with voice recognition software. Similar sounding words can inadvertently be transcribed and may be missed upon review. Eilleen Kempf, MD 01/17/17

## 2017-01-16 NOTE — Assessment & Plan Note (Addendum)
This is a very pleasant 66 year old with unresectable a stage IIIa non-small cell lung cancer. The patient underwent a course of concurrent chemoradiation with weekly carboplatin and paclitaxel status post 8 cycles and tolerated this treatment fairly well except for mild fatigue and odynophagia. She was recently treated for pneumonia at [facility] with Zosyn and vancomycin followed by outpatient treatment with Levaquin followed by doxycycline. She had a restaging CT scan of the chest. Her scan showed improvement of her disease but there are new patchy groundglass attenuation in the left upper lobe and medial right lung questionable for radiation pneumonitis but inflammatory process cannot be excluded.  The patient was seen with Dr. Julien Nordmann. We again had a lengthy discussion with the patient today about her current condition and treatment options. I would have recommended for the patient treatment with consolidation immunotherapy with Imfinzi (Durvalumab) but with the patient currently taking prednisone, we will need to hold off on this. Once the patient's prednisone dose is down below 10 mg a day, we will consider starting treatment. Adverse effects of this treatment were discussed including but not limited to immune mediated the skin rash, diarrhea, inflammation of the lung, kidney, liver, thyroid or other endocrine dysfunction.  We will plan to see the patient back in 3-4 weeks to discuss treatment.  The patient inquired about her diagnosis COPD. Discussed with the patient and she is currently on treatment with prednisone which can help her COPD. Recommend that she talk with her primary care provider for ongoing management of COPD.  He patient will return back to the lab after the visit today to redraw her CMET since the first specimen hemolyzed.

## 2017-01-16 NOTE — Telephone Encounter (Signed)
Scheduled appt per 9/18 los - no avs or calender wanted.

## 2017-01-17 ENCOUNTER — Ambulatory Visit (INDEPENDENT_AMBULATORY_CARE_PROVIDER_SITE_OTHER): Payer: Medicare Other | Admitting: Family Medicine

## 2017-01-17 DIAGNOSIS — R791 Abnormal coagulation profile: Secondary | ICD-10-CM | POA: Diagnosis not present

## 2017-01-17 DIAGNOSIS — Z7901 Long term (current) use of anticoagulants: Secondary | ICD-10-CM | POA: Diagnosis not present

## 2017-01-17 LAB — POCT INR

## 2017-01-17 NOTE — Patient Instructions (Addendum)
Hold coumadin today Recheck INR tomorrow. Any signs of bleeding call 911 and go to the Emergency department.

## 2017-01-17 NOTE — Progress Notes (Signed)
Specific complex office visit. Initially the patient came in for INR. INR measured very high. The patient denied any headache denied shortness of breath denied bleeding issues no bruising. Denied melena rectal bleeding. He denied hematuria. Denied coffee-ground emesis. Denied any chest pressure. No recent falls no head injuries no loss of consciousness. The patient relates she's been taking her medicine as directed she's never had this problem before but she does relate she's been taking doxycycline somewhere in the past couple weeks find infection as prescribed by her oncologist Patient on chronic Coumadin form a smoker she is been treated for squamous cell cancer of the lung  Medication list recent lab work reviewed  ROS denies all the above please see above. In addition this no chest pressure tightness does relate some weakness denies sweats chills denies change in appetite. Denies joint pain.  Vital signs stable. Lungs clear. Skin warm dry no bruising noted no petechiae.  Extensive discussion held with patient regarding possible courses of action-the patient overall was very stable. Showing no signs of bleeding or a few months dynamic instability. Upon research of this issue with UP to Date. He was presented with the option of referral to the ER for overnight admission or using vitamin K to help bring her INR detail.  25 minutes was spent with the patient. Greater than half the time was spent in discussion and answering questions and counseling regarding the issues that the patient came in for today.  The patient would like to be treated with vitamin K to avoid hospital admission. We called around multiple pharmacies there was no oral vitamin K available. Vitamin K was available through the pharmacy as injectable vitamin K. According to the results which that I did this can be given orally. So therefore this point amounts was given by mouth with water to drink afterwards. This equals 2 mgmilligrams  of vitamin K. Patient was instructed specifically that if she should start having any bleeding to call 911. She was also advised not to have any falls. She was advised not to take Coumadin or any anti-inflammatories. She is to follow-up on Thursday for INR.

## 2017-01-17 NOTE — Progress Notes (Signed)
Vit K 10mg  per ml- 0.39ml give SL to patient per Dr Bary Leriche order. Patient observed for 15 minutes and doing well.

## 2017-01-18 ENCOUNTER — Ambulatory Visit (INDEPENDENT_AMBULATORY_CARE_PROVIDER_SITE_OTHER): Payer: Medicare Other

## 2017-01-18 DIAGNOSIS — Z7901 Long term (current) use of anticoagulants: Secondary | ICD-10-CM

## 2017-01-18 LAB — POCT INR: INR: 2.1

## 2017-01-18 NOTE — Patient Instructions (Signed)
Take 1/2 tablet on Sunday,Tuesday, Thursday, and Friday and 1 whole tablet all other days. Recheck INR 01/25/17

## 2017-01-25 ENCOUNTER — Ambulatory Visit (INDEPENDENT_AMBULATORY_CARE_PROVIDER_SITE_OTHER): Payer: Medicare Other

## 2017-01-25 DIAGNOSIS — Z5181 Encounter for therapeutic drug level monitoring: Secondary | ICD-10-CM

## 2017-01-25 DIAGNOSIS — Z7901 Long term (current) use of anticoagulants: Secondary | ICD-10-CM | POA: Diagnosis not present

## 2017-01-25 LAB — POCT INR: INR: 5.5

## 2017-01-25 NOTE — Patient Instructions (Signed)
None today 01/25/2017 , none tomorrow 01/26/2017, then Half tabf all days until further instructions on Thursday 02/01/2017.

## 2017-02-01 ENCOUNTER — Ambulatory Visit (INDEPENDENT_AMBULATORY_CARE_PROVIDER_SITE_OTHER): Payer: Medicare Other

## 2017-02-01 DIAGNOSIS — Z7901 Long term (current) use of anticoagulants: Secondary | ICD-10-CM

## 2017-02-01 LAB — POCT INR: INR: 3.6

## 2017-02-01 NOTE — Patient Instructions (Signed)
Pt has 5 mg tablets she is aware to take 0.5 all days,except none on Thursdays. Pt is aware.

## 2017-02-05 DIAGNOSIS — C3432 Malignant neoplasm of lower lobe, left bronchus or lung: Secondary | ICD-10-CM | POA: Diagnosis not present

## 2017-02-05 DIAGNOSIS — J700 Acute pulmonary manifestations due to radiation: Secondary | ICD-10-CM | POA: Diagnosis not present

## 2017-02-05 DIAGNOSIS — C3492 Malignant neoplasm of unspecified part of left bronchus or lung: Secondary | ICD-10-CM | POA: Diagnosis not present

## 2017-02-05 DIAGNOSIS — G4733 Obstructive sleep apnea (adult) (pediatric): Secondary | ICD-10-CM | POA: Diagnosis not present

## 2017-02-08 ENCOUNTER — Other Ambulatory Visit: Payer: Self-pay | Admitting: Cardiology

## 2017-02-09 ENCOUNTER — Ambulatory Visit (INDEPENDENT_AMBULATORY_CARE_PROVIDER_SITE_OTHER): Payer: Medicare Other | Admitting: Family Medicine

## 2017-02-09 ENCOUNTER — Telehealth: Payer: Self-pay | Admitting: *Deleted

## 2017-02-09 ENCOUNTER — Encounter: Payer: Self-pay | Admitting: Family Medicine

## 2017-02-09 VITALS — BP 126/70 | Ht 67.0 in | Wt 240.2 lb

## 2017-02-09 DIAGNOSIS — J449 Chronic obstructive pulmonary disease, unspecified: Secondary | ICD-10-CM | POA: Diagnosis not present

## 2017-02-09 DIAGNOSIS — Z7901 Long term (current) use of anticoagulants: Secondary | ICD-10-CM

## 2017-02-09 LAB — POCT INR: INR: 3

## 2017-02-09 MED ORDER — KETOCONAZOLE 2 % EX CREA: TOPICAL_CREAM | CUTANEOUS | 2 refills | Status: DC

## 2017-02-09 MED ORDER — FLUTICASONE-SALMETEROL 115-21 MCG/ACT IN AERO: 2.0000 | INHALATION_SPRAY | Freq: Two times a day (BID) | RESPIRATORY_TRACT | 12 refills | Status: DC

## 2017-02-09 MED ORDER — ALBUTEROL SULFATE HFA 108 (90 BASE) MCG/ACT IN AERS: 2.0000 | INHALATION_SPRAY | Freq: Four times a day (QID) | RESPIRATORY_TRACT | 5 refills | Status: AC | PRN

## 2017-02-09 NOTE — Progress Notes (Signed)
   Subjective:    Patient ID: Sarah [name], female    DOB: 1951/01/12, 66 y.o.   MRN: [id number]  HPI Patient in today for a recheck INR. Patient states no other concerns this visit.  Patient notes compliance with Coumadin. No difficulties with excess bleeding.  Comes in for extensive discussion regarding COPD. Pulmonary function tests were ordered as part of patient's lung cancer workup. This revealed COPD. Patient expresses surprise that she has been diagnosed with this. Of note patient has smoked for 50 years at this time averaging one pack per day.  Had noted progressive shortness of breath with exertion with a past year. However really did not create a substantial problem until radiation treatment for pulmonary cancer. Patient developed subsequent radiation pneumonitis. Also is had bronchitis with exacerbation of reactive airways. Also potential pneumonia.  Patient no longer smoking thankfully   Patient has concerns of COPD diagnoses.   Results for orders placed or performed in visit on 02/09/17  POCT INR  Result Value Ref Range   INR 3.0      Review of Systems No headache, no major weight loss or weight gain, no chest pain no back pain abdominal pain no change in bowel habits complete ROS otherwise negative     Objective:   Physical Exam Alert and oriented, vitals reviewed and stable, NAD ENT-TM's and ext canals WNL bilat via otoscopic exam Soft palate, tonsils and post pharynx WNL via oropharyngeal exam Neck-symmetric, no masses; thyroid nonpalpable and nontender Pulmonary-no tachypnea or accessory muscle use; Faint wheeze with deep breaths. No inspiratory crackles. Breath sounds somewhat diminished Card--no abnrml murmurs, rhythm reg and rate WNL Carotid pulses symmetric, without bruits   Pulmonary function tests and scan results reviewed    Assessment & Plan:  Impression 1 COPD discussed at length. With acute reactive airway component. Discussed potential  treatments. Patient having daily symptoms fairly substantial. Warrants both preventive and secondary intervention agent. Discussed length. Choices discussed. Will initiate Advair twice a day. Also when necessary albuterol. Proper use discussed. Multiple questions answered.  Greater than 50% of this 25 minute face to face visit was spent in counseling and discussion and coordination of care regarding the above diagnosis/diagnosies. Patient to maintain same dose of Coumadin. Follow-up as scheduled

## 2017-02-09 NOTE — Telephone Encounter (Signed)
Fax from [employer] requesting med change.  advair hfa 115-21 mcg inhaler 2 puffs bid not covered by insurance.

## 2017-02-09 NOTE — Telephone Encounter (Signed)
Patient will call the pharmacy or her insurance company to find out what the preferred medication is and call us back.

## 2017-02-09 NOTE — Telephone Encounter (Signed)
Patient is aware, and rx for cream sent In.

## 2017-02-09 NOTE — Patient Instructions (Signed)
Take 0.5 tablets all day excepts thursdays, do not take any on thursdays.

## 2017-02-09 NOTE — Telephone Encounter (Signed)
What is covered? Also call in ketoconazole 30 g one bid for rash with two refills, (symbicort? advair discus?)

## 2017-02-11 DIAGNOSIS — J449 Chronic obstructive pulmonary disease, unspecified: Secondary | ICD-10-CM | POA: Insufficient documentation

## 2017-02-12 ENCOUNTER — Other Ambulatory Visit: Payer: Self-pay | Admitting: Medical Oncology

## 2017-02-12 DIAGNOSIS — C3492 Malignant neoplasm of unspecified part of left bronchus or lung: Secondary | ICD-10-CM

## 2017-02-13 ENCOUNTER — Encounter: Payer: Self-pay | Admitting: *Deleted

## 2017-02-13 ENCOUNTER — Telehealth: Payer: Self-pay | Admitting: *Deleted

## 2017-02-13 ENCOUNTER — Telehealth: Payer: Self-pay | Admitting: Internal Medicine

## 2017-02-13 ENCOUNTER — Other Ambulatory Visit (HOSPITAL_BASED_OUTPATIENT_CLINIC_OR_DEPARTMENT_OTHER): Payer: Medicare Other

## 2017-02-13 ENCOUNTER — Ambulatory Visit (HOSPITAL_BASED_OUTPATIENT_CLINIC_OR_DEPARTMENT_OTHER): Payer: Medicare Other | Admitting: Internal Medicine

## 2017-02-13 ENCOUNTER — Encounter: Payer: Self-pay | Admitting: Internal Medicine

## 2017-02-13 VITALS — BP 153/69 | HR 79 | Temp 98.1°F | Resp 18 | Ht 67.0 in | Wt 243.3 lb

## 2017-02-13 DIAGNOSIS — R1310 Dysphagia, unspecified: Secondary | ICD-10-CM | POA: Diagnosis not present

## 2017-02-13 DIAGNOSIS — C3492 Malignant neoplasm of unspecified part of left bronchus or lung: Secondary | ICD-10-CM

## 2017-02-13 DIAGNOSIS — Z7189 Other specified counseling: Secondary | ICD-10-CM

## 2017-02-13 DIAGNOSIS — R05 Cough: Secondary | ICD-10-CM | POA: Diagnosis not present

## 2017-02-13 DIAGNOSIS — C3432 Malignant neoplasm of lower lobe, left bronchus or lung: Secondary | ICD-10-CM

## 2017-02-13 DIAGNOSIS — Z23 Encounter for immunization: Secondary | ICD-10-CM | POA: Diagnosis not present

## 2017-02-13 DIAGNOSIS — R0609 Other forms of dyspnea: Secondary | ICD-10-CM

## 2017-02-13 DIAGNOSIS — Z5112 Encounter for antineoplastic immunotherapy: Secondary | ICD-10-CM

## 2017-02-13 DIAGNOSIS — R5382 Chronic fatigue, unspecified: Secondary | ICD-10-CM

## 2017-02-13 LAB — COMPREHENSIVE METABOLIC PANEL
ALT: 40 U/L (ref 0–55)
ANION GAP: 12 meq/L — AB (ref 3–11)
AST: 25 U/L (ref 5–34)
Albumin: 3.4 g/dL — ABNORMAL LOW (ref 3.5–5.0)
Alkaline Phosphatase: 91 U/L (ref 40–150)
BUN: 7.5 mg/dL (ref 7.0–26.0)
CHLORIDE: 103 meq/L (ref 98–109)
CO2: 28 meq/L (ref 22–29)
Calcium: 9.6 mg/dL (ref 8.4–10.4)
Creatinine: 0.9 mg/dL (ref 0.6–1.1)
GLUCOSE: 141 mg/dL — AB (ref 70–140)
POTASSIUM: 4.2 meq/L (ref 3.5–5.1)
SODIUM: 143 meq/L (ref 136–145)
Total Bilirubin: 0.34 mg/dL (ref 0.20–1.20)
Total Protein: 6.7 g/dL (ref 6.4–8.3)

## 2017-02-13 LAB — CBC WITH DIFFERENTIAL/PLATELET
BASO%: 0.3 % (ref 0.0–2.0)
BASOS ABS: 0 10*3/uL (ref 0.0–0.1)
EOS ABS: 0 10*3/uL (ref 0.0–0.5)
EOS%: 0.3 % (ref 0.0–7.0)
HCT: 41.1 % (ref 34.8–46.6)
HGB: 12.8 g/dL (ref 11.6–15.9)
LYMPH%: 7.5 % — AB (ref 14.0–49.7)
MCH: 32.7 pg (ref 25.1–34.0)
MCHC: 31.1 g/dL — AB (ref 31.5–36.0)
MCV: 105.1 fL — ABNORMAL HIGH (ref 79.5–101.0)
MONO#: 0.2 10*3/uL (ref 0.1–0.9)
MONO%: 2.5 % (ref 0.0–14.0)
NEUT%: 89.4 % — AB (ref 38.4–76.8)
NEUTROS ABS: 6.8 10*3/uL — AB (ref 1.5–6.5)
PLATELETS: 207 10*3/uL (ref 145–400)
RBC: 3.91 10*6/uL (ref 3.70–5.45)
RDW: 16.9 % — ABNORMAL HIGH (ref 11.2–14.5)
WBC: 7.6 10*3/uL (ref 3.9–10.3)
lymph#: 0.6 10*3/uL — ABNORMAL LOW (ref 0.9–3.3)

## 2017-02-13 LAB — TECHNOLOGIST REVIEW

## 2017-02-13 MED ORDER — BUDESONIDE-FORMOTEROL FUMARATE 160-4.5 MCG/ACT IN AERO: 2.0000 | INHALATION_SPRAY | Freq: Two times a day (BID) | RESPIRATORY_TRACT | 12 refills | Status: AC

## 2017-02-13 MED ORDER — INFLUENZA VAC SPLIT QUAD 0.5 ML IM SUSY
0.5000 mL | PREFILLED_SYRINGE | Freq: Once | INTRAMUSCULAR | Status: AC
  Administered 2017-02-13: 0.5 mL via INTRAMUSCULAR
  Filled 2017-02-13: qty 0.5

## 2017-02-13 NOTE — Progress Notes (Signed)
Oncology Nurse Navigator Documentation  Oncology Nurse Navigator Flowsheets 02/13/2017  Navigator Location [facility]  Navigator Encounter Type Clinic/MDC/I spoke with patient and husband today. Patient will have treatment change to Imfinzi.  I helped explain medication and side effects.  I will place written information about medication in the mail.    Patient Visit Type MedOnc  Treatment Phase Other  Barriers/Navigation Needs Education  Education Other  Interventions Education  Education Method Verbal;Written  Acuity Level 2  Time Spent with Patient 38

## 2017-02-13 NOTE — Telephone Encounter (Signed)
Symbicort sent into pharmacy. Tried to call no answer

## 2017-02-13 NOTE — Telephone Encounter (Signed)
Scheduled appt per 10/16 los and per MD notes . Gave patient AVS and calender per los.

## 2017-02-13 NOTE — Progress Notes (Signed)
DISCONTINUE ON PATHWAY REGIMEN - Non-Small Cell Lung     Administer weekly:     Paclitaxel      Carboplatin   **Always confirm dose/schedule in your pharmacy ordering system**    REASON: Continuation Of Treatment PRIOR TREATMENT: ZPH150: Carboplatin AUC=2 + Paclitaxel 45 mg/m2 Weekly During Radiation TREATMENT RESPONSE: Partial Response (PR)  START ON PATHWAY REGIMEN - Non-Small Cell Lung     A cycle is every 14 days:     Durvalumab   **Always confirm dose/schedule in your pharmacy ordering system**    Patient Characteristics: Stage III - Unresectable, PS = 0, 1 AJCC T Category: T4 Current Disease Status: No Distant Mets or Local Recurrence AJCC N Category: N0 AJCC M Category: M0 AJCC 8 Stage Grouping: IIIA Performance Status: PS = 0, 1 Intent of Therapy: Curative Intent, Discussed with Patient

## 2017-02-13 NOTE — Telephone Encounter (Signed)
Fax from [employer]. Insurance does not cover advair hfa. Please substitute one of the following Airduo, breo-ellipta, symbicort. Thanks.

## 2017-02-13 NOTE — Progress Notes (Signed)
[facility] [phone]   [phone]  OFFICE PROGRESS NOTE  [name], MD [address] [place] [address]  DIAGNOSIS: Unresectable stage IIIA (T4, N0, M0) non-small cell lung cancer, squamous cell carcinoma diagnosed in May 2018 and presented with large obstructing left lower lobe lung mass suspicious small mediastinal lymphadenopathy.  PRIOR THERAPY: A course of concurrent chemoradiation with weekly carboplatin for AUC of 2 and paclitaxel 45 MG/M2. Status post 5 cycles. She received her radiation in [facility]. Last dose of chemotherapy was given 11/27/2016.Marland Kitchen  CURRENT THERAPY: consolidation immunotherapy with Imfinzi (Durvalumab) 10 MG/KG every 2 weeks, first dose 02/21/2017.  INTERVAL HISTORY: Sarah [name] 66 y.o. female returns to the clinic today for follow-up visit accompanied by her husband. The patient is feeling much better today. She is currently on a tapering dose of prednisone down to 10 mg by mouth daily and starting tomorrow she will be taken 10 mg every other day. She continues to have shortness of breath with exertion but no significant chest pain or hemoptysis. She has mild cough. She denied having any fever or chills. She has no nausea, vomiting, diarrhea or constipation. She is here today for evaluation and discussion of her treatment options.  MEDICAL HISTORY: Past Medical History:  Diagnosis Date  . Arthritis   . Cancer ([place])    lung cancer  . Chronic anxiety    Patient describes as stress  . Chronic back pain   . DVT (deep venous thrombosis) ([place]) age 59  . GERD (gastroesophageal reflux disease)   . Goals of care, counseling/discussion 09/21/2016  . Headache(784.0)   . Hypoestrogenism   . Lower extremity venous stasis    LLE, chronic  . Obesity   . Odynophagia 11/13/2016  . Paroxysmal atrial fibrillation ([facility])   . PONV (postoperative nausea and vomiting)   . Pulmonary embolism ([place])   .  Reflux   . Sleep apnea    wear CPAP  . Spinal stenosis   . Tobacco abuse   . Venous stasis   . Warfarin anticoagulation     ALLERGIES:  is allergic to aspirin.  MEDICATIONS:  Current Outpatient Prescriptions  Medication Sig Dispense Refill  . albuterol (PROVENTIL HFA;VENTOLIN HFA) 108 (90 Base) MCG/ACT inhaler Inhale 2 puffs into the lungs every 6 (six) hours as needed for wheezing or shortness of breath. 1 Inhaler 5  . buPROPion (WELLBUTRIN SR) 150 MG 12 hr tablet TAKE 1 TABLET BY MOUTH TWICE DAILY **KEEP APPT ON 06/09/16** (Patient taking differently: Take 150 mg by mouth daily. TAKE 1 TABLET BY MOUTH TWICE DAILY **KEEP APPT ON 06/09/16**) 180 tablet 0  . calcium carbonate (TUMS - DOSED IN MG ELEMENTAL CALCIUM) 500 MG chewable tablet Chew 1 tablet by mouth 2 (two) times a week.     . diltiazem (CARDIZEM CD) 180 MG 24 hr capsule TAKE ONE CAPSULE BY MOUTH EVERY DAY 90 capsule 1  . flecainide (TAMBOCOR) 50 MG tablet TAKE 1 TABLET (50 MG TOTAL) BY MOUTH 2 (TWO) TIMES DAILY. 60 tablet 2  . fluticasone-salmeterol (ADVAIR HFA) 115-21 MCG/ACT inhaler Inhale 2 puffs into the lungs 2 (two) times daily. 1 Inhaler 12  . HYDROcodone-acetaminophen (NORCO) 7.5-325 MG tablet One tablet up to four times a day as needed for pain 60 tablet 0  . ketoconazole (NIZORAL) 2 % cream Apply BID for rash 30 g 2  . metoprolol tartrate (LOPRESSOR) 25 MG tablet TAKE 1 TABLET AS  NEEDED FOR HEART RACING 10 tablet 5  . ondansetron (ZOFRAN ODT) 4 MG disintegrating tablet Take 1 tablet by mouth every 6 hours as needed for nausea. 21 tablet 0  . pantoprazole (PROTONIX) 40 MG tablet TAKE 1 TABLET EVERY MORNING 90 tablet 1  . predniSONE (DELTASONE) 20 MG tablet Take 20 mg by mouth daily with breakfast.    . prochlorperazine (COMPAZINE) 10 MG tablet Take 1 tablet (10 mg total) by mouth every 6 (six) hours as needed for nausea or vomiting. 30 tablet 0  . promethazine (PHENERGAN) 12.5 MG tablet Take 1 tablet (12.5 mg total) by  mouth every 6 (six) hours as needed for nausea or vomiting. 30 tablet 0  . sucralfate (CARAFATE) 1 g tablet Take 1 tablet (1 g total) by mouth 2 (two) times daily. 60 tablet 0  . warfarin (COUMADIN) 5 MG tablet Take 2.5-5 mg by mouth daily. Patient takes 5mg  on Monday, Wednesday, and Saturday. One Tuesday, Thursday, Friday, and Sunday, patient takes 2.5mg     . potassium chloride SA (K-DUR,KLOR-CON) 20 MEQ tablet Take 1 tablet (20 mEq total) by mouth once. 10 tablet 0   No current facility-administered medications for this visit.     SURGICAL HISTORY:  Past Surgical History:  Procedure Laterality Date  . ANKLE SURGERY     x2  . APPENDECTOMY    . CARPAL TUNNEL RELEASE    . CHOLECYSTECTOMY    . OVARIAN CYST REMOVAL    . ROTATOR CUFF REPAIR    . TUBAL LIGATION    . VIDEO BRONCHOSCOPY WITH ENDOBRONCHIAL ULTRASOUND N/A 09/14/2016   Procedure: VIDEO BRONCHOSCOPY WITH ENDOBRONCHIAL ULTRASOUND;  Surgeon: Melrose Nakayama, MD;  Location: [facility];  Service: Thoracic;  Laterality: N/A;    REVIEW OF SYSTEMS:  Constitutional: positive for fatigue Eyes: negative Ears, nose, mouth, throat, and face: negative Respiratory: positive for cough and dyspnea on exertion Cardiovascular: negative Gastrointestinal: negative Genitourinary:negative Integument/breast: negative Hematologic/lymphatic: negative Musculoskeletal:negative Neurological: negative Behavioral/Psych: negative Endocrine: negative Allergic/Immunologic: negative   PHYSICAL EXAMINATION: General appearance: alert, cooperative, fatigued and no distress Head: Normocephalic, without obvious abnormality, atraumatic Neck: no adenopathy, no JVD, supple, symmetrical, trachea midline and thyroid not enlarged, symmetric, no tenderness/mass/nodules Lymph nodes: Cervical, supraclavicular, and axillary nodes normal. Resp: diminished breath sounds LLL and wheezes LLL Back: symmetric, no curvature. ROM normal. No CVA tenderness. Cardio: regular  rate and rhythm, S1, S2 normal, no murmur, click, rub or gallop GI: soft, non-tender; bowel sounds normal; no masses,  no organomegaly Extremities: extremities normal, atraumatic, no cyanosis or edema Neurologic: Alert and oriented X 3, normal strength and tone. Normal symmetric reflexes. Normal coordination and gait  ECOG PERFORMANCE STATUS: 1 - Symptomatic but completely ambulatory  Blood pressure (!) 153/69, pulse 79, temperature 98.1 F (36.7 C), temperature source Oral, resp. rate 18, height 5\' 7"  (1.702 m), weight 243 lb 4.8 oz (110.4 kg), SpO2 96 %.  LABORATORY DATA: Lab Results  Component Value Date   WBC 7.6 02/13/2017   HGB 12.8 02/13/2017   HCT 41.1 02/13/2017   MCV 105.1 (H) 02/13/2017   PLT 207 02/13/2017      Chemistry      Component Value Date/Time   NA 143 02/13/2017 0924   K 4.2 02/13/2017 0924   CL 101 12/20/2016 0449   CO2 28 02/13/2017 0924   BUN 7.5 02/13/2017 0924   CREATININE 0.9 02/13/2017 0924      Component Value Date/Time   CALCIUM 9.6 02/13/2017 0924   ALKPHOS 91 02/13/2017  0924   AST 25 02/13/2017 0924   ALT 40 02/13/2017 0924   BILITOT 0.34 02/13/2017 0924       RADIOGRAPHIC STUDIES: No results found.  ASSESSMENT AND PLAN:  This is a very pleasant 66 years old with unresectable a stage IIIa non-small cell lung cancer. The patient underwent a course of concurrent chemoradiation with weekly carboplatin and paclitaxel status post 8 cycles and has been tolerating this treatment fairly well except for mild fatigue and odynophagia. The patient is recovering very well at this point. She will is on a tapering dose of prednisone for the radiation-induced pneumonitis and she is feeling much better she is currently on prednisone 10 mg by mouth daily. I had a lengthy discussion with the patient and her husband about her condition and treatment options. I gave the patient the option of continuous observation and close monitoring versus consideration of  treatment with consolidation immunotherapy with Imfinzi (Durvalumab) 10 MG/KG every 2 weeks. I discussed with the patient adverse effect of the immunotherapy including but not limited to immunotherapy mediated skin rash, diarrhea, inflammation of the lung, kidney, liver, thyroid or other endocrine dysfunction including type 1 diabetes mellitus. She would like to proceed with the treatment with immunotherapy and she is expected to start the first cycle of this treatment on 02/21/2017. I will see the patient back for follow-up visit with the start of cycle #2. She was advised to call immediately if she has any concerning symptoms in the interval. The patient will receive flu vaccine today.  The patient voices understanding of current disease status and treatment options and is in agreement with the current care plan. All questions were answered. The patient knows to call the clinic with any problems, questions or concerns. We can certainly see the patient much sooner if necessary.  Disclaimer: This note was dictated with voice recognition software. Similar sounding words can inadvertently be transcribed and may not be corrected upon review.

## 2017-02-13 NOTE — Telephone Encounter (Signed)
symbicort 160/4.5  Two puffs bid  One yr's ref  Cancel advair

## 2017-02-15 NOTE — Telephone Encounter (Signed)
Patient states she has the Symbicort,and Albuterol picked up from the pharmacy.

## 2017-02-21 ENCOUNTER — Other Ambulatory Visit (HOSPITAL_BASED_OUTPATIENT_CLINIC_OR_DEPARTMENT_OTHER): Payer: Medicare Other

## 2017-02-21 ENCOUNTER — Ambulatory Visit (HOSPITAL_BASED_OUTPATIENT_CLINIC_OR_DEPARTMENT_OTHER): Payer: Medicare Other

## 2017-02-21 VITALS — BP 138/45 | HR 91 | Temp 98.1°F | Resp 18

## 2017-02-21 DIAGNOSIS — Z5112 Encounter for antineoplastic immunotherapy: Secondary | ICD-10-CM | POA: Diagnosis present

## 2017-02-21 DIAGNOSIS — C3432 Malignant neoplasm of lower lobe, left bronchus or lung: Secondary | ICD-10-CM | POA: Diagnosis present

## 2017-02-21 DIAGNOSIS — C3492 Malignant neoplasm of unspecified part of left bronchus or lung: Secondary | ICD-10-CM

## 2017-02-21 LAB — COMPREHENSIVE METABOLIC PANEL
ALBUMIN: 3.4 g/dL — AB (ref 3.5–5.0)
ALK PHOS: 91 U/L (ref 40–150)
ALT: 36 U/L (ref 0–55)
ANION GAP: 14 meq/L — AB (ref 3–11)
AST: 26 U/L (ref 5–34)
BILIRUBIN TOTAL: 0.31 mg/dL (ref 0.20–1.20)
BUN: 7.4 mg/dL (ref 7.0–26.0)
CO2: 24 mEq/L (ref 22–29)
CREATININE: 0.8 mg/dL (ref 0.6–1.1)
Calcium: 9.4 mg/dL (ref 8.4–10.4)
Chloride: 105 mEq/L (ref 98–109)
EGFR: >60 mL/min/{1.73_m2} (ref >60)
GLUCOSE: 145 mg/dL — AB (ref 70–140)
Potassium: 3.4 mEq/L — ABNORMAL LOW (ref 3.5–5.1)
SODIUM: 142 meq/L (ref 136–145)
TOTAL PROTEIN: 6.7 g/dL (ref 6.4–8.3)

## 2017-02-21 LAB — CBC WITH DIFFERENTIAL/PLATELET
BASO%: 0.6 % (ref 0.0–2.0)
Basophils Absolute: 0 10*3/uL (ref 0.0–0.1)
EOS ABS: 0 10*3/uL (ref 0.0–0.5)
EOS%: 0.5 % (ref 0.0–7.0)
HCT: 40.6 % (ref 34.8–46.6)
HEMOGLOBIN: 13.3 g/dL (ref 11.6–15.9)
LYMPH#: 0.2 10*3/uL — AB (ref 0.9–3.3)
LYMPH%: 2.8 % — AB (ref 14.0–49.7)
MCH: 33.2 pg (ref 25.1–34.0)
MCHC: 32.9 g/dL (ref 31.5–36.0)
MCV: 101.1 fL — ABNORMAL HIGH (ref 79.5–101.0)
MONO#: 0.4 10*3/uL (ref 0.1–0.9)
MONO%: 5.5 % (ref 0.0–14.0)
NEUT%: 90.6 % — ABNORMAL HIGH (ref 38.4–76.8)
NEUTROS ABS: 6.9 10*3/uL — AB (ref 1.5–6.5)
PLATELETS: 195 10*3/uL (ref 145–400)
RBC: 4.02 10*6/uL (ref 3.70–5.45)
RDW: 17.1 % — AB (ref 11.2–14.5)
WBC: 7.6 10*3/uL (ref 3.9–10.3)

## 2017-02-21 MED ORDER — SODIUM CHLORIDE 0.9 % IV SOLN
Freq: Once | INTRAVENOUS | Status: AC
  Administered 2017-02-21: 13:00:00 via INTRAVENOUS

## 2017-02-21 MED ORDER — SODIUM CHLORIDE 0.9 % IV SOLN
10.1000 mg/kg | Freq: Once | INTRAVENOUS | Status: AC
  Administered 2017-02-21: 1120 mg via INTRAVENOUS
  Filled 2017-02-21: qty 2.4

## 2017-02-21 NOTE — Patient Instructions (Signed)
[facility] Discharge Instructions for Patients Receiving Chemotherapy  Today you received the following chemotherapy agents: Imfinzi    To help prevent nausea and vomiting after your treatment, we encourage you to take your nausea medication as prescribed.    If you develop nausea and vomiting that is not controlled by your nausea medication, call the clinic.   BELOW ARE SYMPTOMS THAT SHOULD BE REPORTED IMMEDIATELY:  *FEVER GREATER THAN 100.5 F  *CHILLS WITH OR WITHOUT FEVER  NAUSEA AND VOMITING THAT IS NOT CONTROLLED WITH YOUR NAUSEA MEDICATION  *UNUSUAL SHORTNESS OF BREATH  *UNUSUAL BRUISING OR BLEEDING  TENDERNESS IN MOUTH AND THROAT WITH OR WITHOUT PRESENCE OF ULCERS  *URINARY PROBLEMS  *BOWEL PROBLEMS  UNUSUAL RASH Items with * indicate a potential emergency and should be followed up as soon as possible.  Feel free to call the clinic should you have any questions or concerns. The clinic phone number is [phone].  Please show the [facility] at check-in to the Emergency Department and triage nurse.  Durvalumab injection What is this medicine? DURVALUMAB (dur VAL ue mab) is a monoclonal antibody. It is used to treat urothelial cancer. This medicine may be used for other purposes; ask your health care provider or pharmacist if you have questions. COMMON BRAND NAME(S): IMFINZI What should I tell my health care provider before I take this medicine? They need to know if you have any of these conditions: -diabetes -immune system problems -infection -inflammatory bowel disease -kidney disease -liver disease -lung or breathing disease -lupus -organ transplant -stomach or intestine problems -thyroid disease -an unusual or allergic reaction to durvalumab, other medicines, foods, dyes, or preservatives -pregnant or trying to get pregnant -breast-feeding How should I use this medicine? This medicine is for infusion into a vein. It is given by  a health care professional in a hospital or clinic setting. A special MedGuide will be given to you before each treatment. Be sure to read this information carefully each time. Talk to your pediatrician regarding the use of this medicine in children. Special care may be needed. Overdosage: If you think you have taken too much of this medicine contact a poison control center or emergency room at once. NOTE: This medicine is only for you. Do not share this medicine with others. What if I miss a dose? It is important not to miss your dose. Call your doctor or health care professional if you are unable to keep an appointment. What may interact with this medicine? Interactions have not been studied. This list may not describe all possible interactions. Give your health care provider a list of all the medicines, herbs, non-prescription drugs, or dietary supplements you use. Also tell them if you smoke, drink alcohol, or use illegal drugs. Some items may interact with your medicine. What should I watch for while using this medicine? This drug may make you feel generally unwell. Continue your course of treatment even though you feel ill unless your doctor tells you to stop. You may need blood work done while you are taking this medicine. Do not become pregnant while taking this medicine or for 3 months after stopping it. Women should inform their doctor if they wish to become pregnant or think they might be pregnant. There is a potential for serious side effects to an unborn child. Talk to your health care professional or pharmacist for more information. Do not breast-feed an infant while taking this medicine or for 3 months after stopping it. What  side effects may I notice from receiving this medicine? Side effects that you should report to your doctor or health care professional as soon as possible: -allergic reactions like skin rash, itching or hives, swelling of the face, lips, or tongue -black, tarry  stools -bloody or watery diarrhea -breathing problems -change in emotions or moods -change in sex drive -changes in vision -chest pain or chest tightness -chills -confusion -cough -facial flushing -fever -headache -signs and symptoms of high blood sugar such as dizziness; dry mouth; dry skin; fruity breath; nausea; stomach pain; increased hunger or thirst; increased urination -signs and symptoms of liver injury like dark yellow or brown urine; general ill feeling or flu-like symptoms; light-colored stools; loss of appetite; nausea; right upper belly pain; unusually weak or tired; yellowing of the eyes or skin -stomach pain -trouble passing urine or change in the amount of urine -weight gain or weight loss Side effects that usually do not require medical attention (report these to your doctor or health care professional if they continue or are bothersome): -bone pain -constipation -loss of appetite -muscle pain -nausea -swelling of the ankles, feet, hands -tiredness This list may not describe all possible side effects. Call your doctor for medical advice about side effects. You may report side effects to FDA at 1-800-FDA-1088. Where should I keep my medicine? This drug is given in a hospital or clinic and will not be stored at home. NOTE: This sheet is a summary. It may not cover all possible information. If you have questions about this medicine, talk to your doctor, pharmacist, or health care provider.  2018 Elsevier/Gold Standard (2015-11-19 15:50:36)

## 2017-02-23 ENCOUNTER — Ambulatory Visit (INDEPENDENT_AMBULATORY_CARE_PROVIDER_SITE_OTHER): Payer: Medicare Other | Admitting: *Deleted

## 2017-02-23 DIAGNOSIS — Z7901 Long term (current) use of anticoagulants: Secondary | ICD-10-CM

## 2017-02-23 LAB — POCT INR: INR: 3.7

## 2017-02-24 DIAGNOSIS — H2513 Age-related nuclear cataract, bilateral: Secondary | ICD-10-CM | POA: Diagnosis not present

## 2017-03-02 ENCOUNTER — Emergency Department (HOSPITAL_COMMUNITY): Payer: Medicare Other

## 2017-03-02 ENCOUNTER — Encounter (HOSPITAL_COMMUNITY): Payer: Self-pay

## 2017-03-02 DIAGNOSIS — R0602 Shortness of breath: Secondary | ICD-10-CM | POA: Diagnosis not present

## 2017-03-02 DIAGNOSIS — J189 Pneumonia, unspecified organism: Secondary | ICD-10-CM | POA: Diagnosis not present

## 2017-03-02 DIAGNOSIS — Z86711 Personal history of pulmonary embolism: Secondary | ICD-10-CM | POA: Diagnosis not present

## 2017-03-02 DIAGNOSIS — F418 Other specified anxiety disorders: Secondary | ICD-10-CM | POA: Diagnosis present

## 2017-03-02 DIAGNOSIS — I10 Essential (primary) hypertension: Secondary | ICD-10-CM | POA: Diagnosis present

## 2017-03-02 DIAGNOSIS — J441 Chronic obstructive pulmonary disease with (acute) exacerbation: Secondary | ICD-10-CM | POA: Diagnosis not present

## 2017-03-02 DIAGNOSIS — Z823 Family history of stroke: Secondary | ICD-10-CM

## 2017-03-02 DIAGNOSIS — Y95 Nosocomial condition: Secondary | ICD-10-CM | POA: Diagnosis present

## 2017-03-02 DIAGNOSIS — J440 Chronic obstructive pulmonary disease with acute lower respiratory infection: Secondary | ICD-10-CM | POA: Diagnosis not present

## 2017-03-02 DIAGNOSIS — K219 Gastro-esophageal reflux disease without esophagitis: Secondary | ICD-10-CM | POA: Diagnosis present

## 2017-03-02 DIAGNOSIS — D7589 Other specified diseases of blood and blood-forming organs: Secondary | ICD-10-CM | POA: Diagnosis present

## 2017-03-02 DIAGNOSIS — C3492 Malignant neoplasm of unspecified part of left bronchus or lung: Secondary | ICD-10-CM | POA: Diagnosis present

## 2017-03-02 DIAGNOSIS — I482 Chronic atrial fibrillation: Secondary | ICD-10-CM | POA: Diagnosis present

## 2017-03-02 DIAGNOSIS — J9620 Acute and chronic respiratory failure, unspecified whether with hypoxia or hypercapnia: Secondary | ICD-10-CM | POA: Diagnosis not present

## 2017-03-02 DIAGNOSIS — Z7901 Long term (current) use of anticoagulants: Secondary | ICD-10-CM

## 2017-03-02 DIAGNOSIS — Z86718 Personal history of other venous thrombosis and embolism: Secondary | ICD-10-CM

## 2017-03-02 DIAGNOSIS — Z8249 Family history of ischemic heart disease and other diseases of the circulatory system: Secondary | ICD-10-CM

## 2017-03-02 DIAGNOSIS — Z9221 Personal history of antineoplastic chemotherapy: Secondary | ICD-10-CM

## 2017-03-02 DIAGNOSIS — G4733 Obstructive sleep apnea (adult) (pediatric): Secondary | ICD-10-CM | POA: Diagnosis present

## 2017-03-02 DIAGNOSIS — R05 Cough: Secondary | ICD-10-CM | POA: Diagnosis not present

## 2017-03-02 DIAGNOSIS — Z923 Personal history of irradiation: Secondary | ICD-10-CM

## 2017-03-02 DIAGNOSIS — Z87891 Personal history of nicotine dependence: Secondary | ICD-10-CM | POA: Diagnosis not present

## 2017-03-02 DIAGNOSIS — Z800 Family history of malignant neoplasm of digestive organs: Secondary | ICD-10-CM

## 2017-03-02 MED ORDER — ALBUTEROL SULFATE (2.5 MG/3ML) 0.083% IN NEBU
5.0000 mg | INHALATION_SOLUTION | Freq: Once | RESPIRATORY_TRACT | Status: AC
  Administered 2017-03-03: 5 mg via RESPIRATORY_TRACT
  Filled 2017-03-02: qty 6

## 2017-03-02 NOTE — ED Triage Notes (Signed)
Pt has lung cancer, goes to [facility] for treatment, states she is on immunotherapy x 2 weeks.  Pt c/o sob that is worsening over the past several hours, has a productive cough with dark green sputum

## 2017-03-03 ENCOUNTER — Inpatient Hospital Stay (HOSPITAL_COMMUNITY)
Admission: EM | Admit: 2017-03-03 | Discharge: 2017-03-05 | DRG: 193 | Disposition: A | Discharge status: Home / Self Care | Payer: Medicare Other | Attending: Internal Medicine | Admitting: Internal Medicine

## 2017-03-03 ENCOUNTER — Encounter (HOSPITAL_COMMUNITY): Payer: Self-pay | Admitting: Family Medicine

## 2017-03-03 ENCOUNTER — Other Ambulatory Visit: Payer: Self-pay

## 2017-03-03 DIAGNOSIS — R059 Cough, unspecified: Secondary | ICD-10-CM

## 2017-03-03 DIAGNOSIS — G4733 Obstructive sleep apnea (adult) (pediatric): Secondary | ICD-10-CM | POA: Diagnosis present

## 2017-03-03 DIAGNOSIS — J440 Chronic obstructive pulmonary disease with acute lower respiratory infection: Secondary | ICD-10-CM | POA: Diagnosis present

## 2017-03-03 DIAGNOSIS — I10 Essential (primary) hypertension: Secondary | ICD-10-CM | POA: Diagnosis present

## 2017-03-03 DIAGNOSIS — C3492 Malignant neoplasm of unspecified part of left bronchus or lung: Secondary | ICD-10-CM | POA: Diagnosis present

## 2017-03-03 DIAGNOSIS — I482 Chronic atrial fibrillation, unspecified: Secondary | ICD-10-CM | POA: Diagnosis present

## 2017-03-03 DIAGNOSIS — K219 Gastro-esophageal reflux disease without esophagitis: Secondary | ICD-10-CM | POA: Diagnosis present

## 2017-03-03 DIAGNOSIS — J189 Pneumonia, unspecified organism: Principal | ICD-10-CM | POA: Diagnosis present

## 2017-03-03 DIAGNOSIS — Y95 Nosocomial condition: Secondary | ICD-10-CM | POA: Diagnosis present

## 2017-03-03 DIAGNOSIS — J441 Chronic obstructive pulmonary disease with (acute) exacerbation: Secondary | ICD-10-CM | POA: Diagnosis present

## 2017-03-03 DIAGNOSIS — J449 Chronic obstructive pulmonary disease, unspecified: Secondary | ICD-10-CM | POA: Diagnosis not present

## 2017-03-03 DIAGNOSIS — D7589 Other specified diseases of blood and blood-forming organs: Secondary | ICD-10-CM | POA: Diagnosis present

## 2017-03-03 DIAGNOSIS — Z7901 Long term (current) use of anticoagulants: Secondary | ICD-10-CM | POA: Diagnosis not present

## 2017-03-03 DIAGNOSIS — Z923 Personal history of irradiation: Secondary | ICD-10-CM | POA: Diagnosis not present

## 2017-03-03 DIAGNOSIS — Z9221 Personal history of antineoplastic chemotherapy: Secondary | ICD-10-CM | POA: Diagnosis not present

## 2017-03-03 DIAGNOSIS — Z86711 Personal history of pulmonary embolism: Secondary | ICD-10-CM | POA: Diagnosis not present

## 2017-03-03 DIAGNOSIS — Z800 Family history of malignant neoplasm of digestive organs: Secondary | ICD-10-CM | POA: Diagnosis not present

## 2017-03-03 DIAGNOSIS — Z8249 Family history of ischemic heart disease and other diseases of the circulatory system: Secondary | ICD-10-CM | POA: Diagnosis not present

## 2017-03-03 DIAGNOSIS — R05 Cough: Secondary | ICD-10-CM

## 2017-03-03 DIAGNOSIS — Z823 Family history of stroke: Secondary | ICD-10-CM | POA: Diagnosis not present

## 2017-03-03 DIAGNOSIS — J9620 Acute and chronic respiratory failure, unspecified whether with hypoxia or hypercapnia: Secondary | ICD-10-CM | POA: Diagnosis present

## 2017-03-03 DIAGNOSIS — F418 Other specified anxiety disorders: Secondary | ICD-10-CM | POA: Diagnosis present

## 2017-03-03 DIAGNOSIS — Z86718 Personal history of other venous thrombosis and embolism: Secondary | ICD-10-CM | POA: Diagnosis not present

## 2017-03-03 LAB — CBC WITH DIFFERENTIAL/PLATELET
BASOS ABS: 0 10*3/uL (ref 0.0–0.1)
Basophils Relative: 0 %
EOS ABS: 0 10*3/uL (ref 0.0–0.7)
EOS PCT: 1 %
HCT: 39.2 % (ref 36.0–46.0)
Hemoglobin: 12.7 g/dL (ref 12.0–15.0)
LYMPHS ABS: 0.3 10*3/uL — AB (ref 0.7–4.0)
LYMPHS PCT: 6 %
MCH: 33.2 pg (ref 26.0–34.0)
MCHC: 32.4 g/dL (ref 30.0–36.0)
MCV: 102.6 fL — AB (ref 78.0–100.0)
MONO ABS: 0.8 10*3/uL (ref 0.1–1.0)
Monocytes Relative: 16 %
Neutro Abs: 3.9 10*3/uL (ref 1.7–7.7)
Neutrophils Relative %: 77 %
PLATELETS: 194 10*3/uL (ref 150–400)
RBC: 3.82 MIL/uL — AB (ref 3.87–5.11)
RDW: 15.5 % (ref 11.5–15.5)
WBC: 5.1 10*3/uL (ref 4.0–10.5)

## 2017-03-03 LAB — BASIC METABOLIC PANEL
ANION GAP: 9 (ref 5–15)
BUN: 8 mg/dL (ref 6–20)
CALCIUM: 9.2 mg/dL (ref 8.9–10.3)
CHLORIDE: 103 mmol/L (ref 101–111)
CO2: 30 mmol/L (ref 22–32)
CREATININE: 0.79 mg/dL (ref 0.44–1.00)
GFR calc Af Amer: >60 mL/min (ref >60)
Glucose, Bld: 120 mg/dL — ABNORMAL HIGH (ref 65–99)
POTASSIUM: 3.7 mmol/L (ref 3.5–5.1)
SODIUM: 142 mmol/L (ref 135–145)

## 2017-03-03 LAB — APTT: aPTT: 27 seconds (ref 24–36)

## 2017-03-03 LAB — PROTIME-INR
INR: 1.23
PROTHROMBIN TIME: 15.4 s — AB (ref 11.4–15.2)

## 2017-03-03 LAB — BRAIN NATRIURETIC PEPTIDE: B Natriuretic Peptide: 18 pg/mL (ref 0.0–100.0)

## 2017-03-03 MED ORDER — DEXTROSE 5 % IV SOLN
1.0000 g | Freq: Once | INTRAVENOUS | Status: AC
  Administered 2017-03-03: 1 g via INTRAVENOUS
  Filled 2017-03-03: qty 1

## 2017-03-03 MED ORDER — PANTOPRAZOLE SODIUM 40 MG PO TBEC
40.0000 mg | DELAYED_RELEASE_TABLET | Freq: Every morning | ORAL | Status: DC
  Administered 2017-03-03 – 2017-03-05 (×3): 40 mg via ORAL
  Filled 2017-03-03 (×3): qty 1

## 2017-03-03 MED ORDER — VANCOMYCIN HCL 10 G IV SOLR
2000.0000 mg | Freq: Once | INTRAVENOUS | Status: DC
  Filled 2017-03-03: qty 2000

## 2017-03-03 MED ORDER — DEXTROSE 5 % IV SOLN
1.0000 g | Freq: Three times a day (TID) | INTRAVENOUS | Status: DC
  Administered 2017-03-03 – 2017-03-04 (×3): 1 g via INTRAVENOUS
  Filled 2017-03-03 (×7): qty 1

## 2017-03-03 MED ORDER — VANCOMYCIN HCL IN DEXTROSE 1-5 GM/200ML-% IV SOLN
INTRAVENOUS | Status: AC
Start: 2017-03-03 — End: 2017-03-03
  Filled 2017-03-03: qty 200

## 2017-03-03 MED ORDER — WARFARIN SODIUM 5 MG PO TABS
5.0000 mg | ORAL_TABLET | Freq: Once | ORAL | Status: AC
  Administered 2017-03-03: 5 mg via ORAL
  Filled 2017-03-03: qty 1

## 2017-03-03 MED ORDER — VANCOMYCIN HCL IN DEXTROSE 750-5 MG/150ML-% IV SOLN
750.0000 mg | Freq: Two times a day (BID) | INTRAVENOUS | Status: DC
Start: 2017-03-03 — End: 2017-03-04
  Administered 2017-03-03 – 2017-03-04 (×2): 750 mg via INTRAVENOUS
  Filled 2017-03-03 (×5): qty 150

## 2017-03-03 MED ORDER — FLECAINIDE ACETATE 50 MG PO TABS
50.0000 mg | ORAL_TABLET | Freq: Two times a day (BID) | ORAL | Status: DC
  Administered 2017-03-03 – 2017-03-05 (×5): 50 mg via ORAL
  Filled 2017-03-03 (×9): qty 1

## 2017-03-03 MED ORDER — HYDROCODONE-ACETAMINOPHEN 5-325 MG PO TABS
2.0000 | ORAL_TABLET | Freq: Once | ORAL | Status: AC
  Administered 2017-03-03: 2 via ORAL
  Filled 2017-03-03: qty 2

## 2017-03-03 MED ORDER — HYDROCODONE-ACETAMINOPHEN 7.5-325 MG PO TABS
1.0000 | ORAL_TABLET | ORAL | Status: DC | PRN
  Administered 2017-03-03 – 2017-03-04 (×3): 1 via ORAL
  Administered 2017-03-04: 2 via ORAL
  Administered 2017-03-04: 1 via ORAL
  Filled 2017-03-03 (×4): qty 1
  Filled 2017-03-03: qty 2

## 2017-03-03 MED ORDER — BUPROPION HCL ER (SR) 150 MG PO TB12
150.0000 mg | ORAL_TABLET | Freq: Every day | ORAL | Status: DC
  Administered 2017-03-03 – 2017-03-05 (×3): 150 mg via ORAL
  Filled 2017-03-03 (×6): qty 1

## 2017-03-03 MED ORDER — CALCIUM CARBONATE ANTACID 500 MG PO CHEW
1.0000 | CHEWABLE_TABLET | ORAL | Status: DC
  Administered 2017-03-05: 200 mg via ORAL
  Filled 2017-03-03 (×2): qty 1

## 2017-03-03 MED ORDER — MOMETASONE FURO-FORMOTEROL FUM 200-5 MCG/ACT IN AERO
2.0000 | INHALATION_SPRAY | Freq: Two times a day (BID) | RESPIRATORY_TRACT | Status: DC
  Administered 2017-03-03 – 2017-03-05 (×4): 2 via RESPIRATORY_TRACT
  Filled 2017-03-03 (×2): qty 8.8

## 2017-03-03 MED ORDER — WARFARIN - PHARMACIST DOSING INPATIENT
Freq: Every day | Status: DC
  Administered 2017-03-03 – 2017-03-04 (×2)

## 2017-03-03 MED ORDER — ALBUTEROL SULFATE (2.5 MG/3ML) 0.083% IN NEBU: 2.5000 mg | INHALATION_SOLUTION | RESPIRATORY_TRACT | Status: DC | PRN

## 2017-03-03 MED ORDER — HEPARIN BOLUS VIA INFUSION: 4000.0000 [IU] | Freq: Once | INTRAVENOUS | Status: DC

## 2017-03-03 MED ORDER — HEPARIN (PORCINE) IN NACL 100-0.45 UNIT/ML-% IJ SOLN
1150.0000 [IU]/h | INTRAMUSCULAR | Status: DC
  Filled 2017-03-03: qty 250

## 2017-03-03 MED ORDER — METHYLPREDNISOLONE SODIUM SUCC 40 MG IJ SOLR
40.0000 mg | Freq: Three times a day (TID) | INTRAMUSCULAR | Status: DC
  Administered 2017-03-03 – 2017-03-04 (×4): 40 mg via INTRAVENOUS
  Filled 2017-03-03 (×4): qty 1

## 2017-03-03 MED ORDER — VANCOMYCIN HCL IN DEXTROSE 1-5 GM/200ML-% IV SOLN
1000.0000 mg | INTRAVENOUS | Status: AC
  Administered 2017-03-03: 07:00:00 via INTRAVENOUS
  Administered 2017-03-03: 1000 mg via INTRAVENOUS
  Filled 2017-03-03 (×2): qty 200

## 2017-03-03 MED ORDER — DILTIAZEM HCL ER COATED BEADS 180 MG PO CP24
180.0000 mg | ORAL_CAPSULE | Freq: Every day | ORAL | Status: DC
  Administered 2017-03-03 – 2017-03-05 (×3): 180 mg via ORAL
  Filled 2017-03-03 (×6): qty 1

## 2017-03-03 NOTE — ED Notes (Signed)
Brooke RN attempted to call report.

## 2017-03-03 NOTE — Progress Notes (Signed)
ANTICOAGULATION CONSULT NOTE - Preliminary  Pharmacy Consult for Heparin Indication: Atrial fibrillation  Allergies  Allergen Reactions  . Aspirin Nausea And Vomiting    Patient Measurements: Height: 5\' 5"  (165.1 cm) Weight: 243 lb (110.2 kg) IBW/kg (Calculated) : 57 HEPARIN DW (KG): 82.9   Vital Signs: Temp: 97.8 F (36.6 C) (11/02 2317) Temp Source: Oral (11/02 2317) BP: 113/63 (11/03 0449) Pulse Rate: 81 (11/03 0449)  Labs:  Recent Labs  03/03/17 0236 03/03/17 0533 03/03/17 0534  HGB 12.7  --   --   HCT 39.2  --   --   PLT 194  --   --   LABPROT  --  15.4*  --   INR  --  1.23  --   CREATININE  --   --  0.79   Estimated Creatinine Clearance: 86.7 mL/min (by C-G formula based on SCr of 0.79 mg/dL).  Medical History: Past Medical History:  Diagnosis Date  . Arthritis   . Cancer ([place])    lung cancer  . Chronic anxiety    Patient describes as stress  . Chronic back pain   . DVT (deep venous thrombosis) ([place]) age 25  . GERD (gastroesophageal reflux disease)   . Goals of care, counseling/discussion 09/21/2016  . Headache(784.0)   . Hypoestrogenism   . Lower extremity venous stasis    LLE, chronic  . Obesity   . Odynophagia 11/13/2016  . Paroxysmal atrial fibrillation ([facility])   . PONV (postoperative nausea and vomiting)   . Pulmonary embolism ([place])   . Reflux   . Sleep apnea    wear CPAP  . Spinal stenosis   . Tobacco abuse   . Venous stasis   . Warfarin anticoagulation     Medications:  Warfarin   Assessment: 66 yo female with PMH sig for atrial fibrillation, on warfarin with subtherapeutic INR. Pharmacy has been consulted for IV heparin dosing to be bridged to therapeutic warfarin dose.  Goal of Therapy:  Heparin level goal: 0.3-0.7 units/ml Monitor platelets by anticoagulation protocol: Yes  Plan:  Heparin 4000 unit IV loading dose Heparin infusion at 1150 units/hr Heparin level in 6-8 hours  Preliminary review of pertinent patient  information completed.  Forestine Na clinical pharmacist will complete review during morning rounds to assess the patient and finalize treatment regimen.  Norberto Sorenson, [facility] 03/03/2017,6:53 AM

## 2017-03-03 NOTE — H&P (Signed)
History and Physical    Sarah [name] YIR:[id number] DOB: 1950/07/23 DOA: 03/03/2017  PCP: [name], MD   Patient coming from: Home  Chief Complaint: Productive cough, dyspnea   HPI: Sarah [name] is a 66 y.o. female with medical history significant for atrial fibrillation on warfarin, cancer of the left lung status post chemoradiation and now on immunotherapy, COPD, hypertension, depression with anxiety, and GERD, now presenting to the emergency department with dyspnea and productive cough.  The patient reports that she had been in her usual state until approximately 48 hours ago when she noted the insidious development of worsening dyspnea and worsening cough, productive of thick green sputum.  Since that time, her condition has continuously worsened.  She describes her symptoms as similar to when she has developed pneumonia in the past.  ED Course: Upon arrival to the ED, patient is found to be afebrile, saturating 90% on room air, and with vitals otherwise stable.  EKG features a sinus rhythm with right axis deviation and chest x-ray is notable for progressive volume loss in the left hemithorax, possibly secondary to radiation changes.  CBC is notable for macrocytosis without anemia.  INR is subtherapeutic at 1.23, and BMP remains pending at this time.  Patient was treated with empiric vancomycin and cefepime, as well as albuterol in the emergency department.  She is placed on supplemental oxygen.  Remains hemodynamically stable, not in acute distress while at rest, and will be admitted to the telemetry unit for ongoing evaluation and management of progressive dyspnea and productive cough concerning for pneumonia.  Review of Systems:  All other systems reviewed and apart from HPI, are negative.  Past Medical History:  Diagnosis Date  . Arthritis   . Cancer ([place])    lung cancer  . Chronic anxiety    Patient describes as stress  . Chronic back pain   . DVT (deep venous  thrombosis) ([place]) age 33  . GERD (gastroesophageal reflux disease)   . Goals of care, counseling/discussion 09/21/2016  . Headache(784.0)   . Hypoestrogenism   . Lower extremity venous stasis    LLE, chronic  . Obesity   . Odynophagia 11/13/2016  . Paroxysmal atrial fibrillation (H[facility])   . PONV (postoperative nausea and vomiting)   . Pulmonary embolism ([place])   . Reflux   . Sleep apnea    wear CPAP  . Spinal stenosis   . Tobacco abuse   . Venous stasis   . Warfarin anticoagulation     Past Surgical History:  Procedure Laterality Date  . ANKLE SURGERY     x2  . APPENDECTOMY    . CARPAL TUNNEL RELEASE    . CHOLECYSTECTOMY    . OVARIAN CYST REMOVAL    . ROTATOR CUFF REPAIR    . TUBAL LIGATION    . VIDEO BRONCHOSCOPY WITH ENDOBRONCHIAL ULTRASOUND N/A 09/14/2016   Procedure: VIDEO BRONCHOSCOPY WITH ENDOBRONCHIAL ULTRASOUND;  Surgeon: Melrose Nakayama, MD;  Location: [facility];  Service: Thoracic;  Laterality: N/A;     reports that she quit smoking about 6 months ago. Her smoking use included Cigarettes. She has a 40.00 pack-year smoking history. She has never used smokeless tobacco. She reports that she does not drink alcohol or use drugs.  Allergies  Allergen Reactions  . Aspirin Nausea And Vomiting    Family History  Problem Relation Age of Onset  . Arrhythmia Mother   . Cancer Mother        colon  .  Heart disease Mother   . Stroke Father        Deceased     Prior to Admission medications   Medication Sig Start Date End Date Taking? Authorizing Provider  albuterol (PROVENTIL HFA;VENTOLIN HFA) 108 (90 Base) MCG/ACT inhaler Inhale 2 puffs into the lungs every 6 (six) hours as needed for wheezing or shortness of breath. 02/09/17   [name], MD  budesonide-formoterol [facility]) 160-4.5 MCG/ACT inhaler Inhale 2 puffs into the lungs 2 (two) times daily. 02/13/17   [name], MD  buPROPion (WELLBUTRIN SR) 150 MG 12 hr tablet TAKE 1 TABLET BY MOUTH TWICE  DAILY **KEEP APPT ON 06/09/16** Patient taking differently: Take 150 mg by mouth daily. TAKE 1 TABLET BY MOUTH TWICE DAILY **KEEP APPT ON 06/09/16** 11/06/16   [name], MD  calcium carbonate (TUMS - DOSED IN MG ELEMENTAL CALCIUM) 500 MG chewable tablet Chew 1 tablet by mouth 2 (two) times a week.     [provider]  diltiazem (CARDIZEM CD) 180 MG 24 hr capsule TAKE ONE CAPSULE BY MOUTH EVERY DAY 10/12/16   [name], MD  flecainide (TAMBOCOR) 50 MG tablet TAKE 1 TABLET (50 MG TOTAL) BY MOUTH 2 (TWO) TIMES DAILY. 02/08/17   Satira Sark, MD  fluticasone-salmeterol (ADVAIR HFA) [phone] MCG/ACT inhaler Inhale 2 puffs into the lungs 2 (two) times daily. 02/09/17   [name], MD  HYDROcodone-acetaminophen [facility]) 7.5-325 MG tablet One tablet up to four times a day as needed for pain 11/29/16   Owens Shark, NP  ketoconazole (NIZORAL) 2 % cream Apply BID for rash 02/09/17   [name], MD  metoprolol tartrate (LOPRESSOR) 25 MG tablet TAKE 1 TABLET AS NEEDED FOR HEART RACING 01/08/17   [name], MD  ondansetron (ZOFRAN ODT) 4 MG disintegrating tablet Take 1 tablet by mouth every 6 hours as needed for nausea. 09/18/16   [name], MD  pantoprazole (PROTONIX) 40 MG tablet TAKE 1 TABLET EVERY MORNING 10/12/16   [name], MD  potassium chloride SA (K-DUR,KLOR-CON) 20 MEQ tablet Take 1 tablet (20 mEq total) by mouth once. 12/26/16 12/26/16  Curt Bears, MD  predniSONE (DELTASONE) 20 MG tablet Take 20 mg by mouth daily with breakfast.    [provider]  prochlorperazine (COMPAZINE) 10 MG tablet Take 1 tablet (10 mg total) by mouth every 6 (six) hours as needed for nausea or vomiting. 12/26/16   Curt Bears, MD  promethazine (PHENERGAN) 12.5 MG tablet Take 1 tablet (12.5 mg total) by mouth every 6 (six) hours as needed for nausea or vomiting. 10/30/16   Curt Bears, MD  sucralfate (CARAFATE) 1 g tablet Take 1 tablet (1 g total)  by mouth 2 (two) times daily. 11/06/16   [name], MD  warfarin (COUMADIN) 5 MG tablet Take 2.5-5 mg by mouth daily. Patient takes 5mg  on Monday, Wednesday, and Saturday. One Tuesday, Thursday, Friday, and Sunday, patient takes 2.5mg     [provider]    Physical Exam: Vitals:   03/02/17 2317 03/02/17 2318 03/03/17 0034 03/03/17 0449  BP: (!) 144/63   113/63  Pulse: 89   81  Resp: 20   20  Temp: 97.8 F (36.6 C)     TempSrc: Oral     SpO2: 91%  90% 98%  Weight:  110.2 kg (243 lb)    Height:  5\' 5"  (1.651 m)        Constitutional: NAD, calm, appears  uncomfortable Eyes: PERTLA, lids and conjunctivae normal ENMT: Mucous membranes are moist. Posterior pharynx clear of any exudate or lesions.   Neck: normal, supple, no masses, no thyromegaly Respiratory: Mild tachypnea, dyspnea with speech, diminished on left with coarse rhonchi. No accessory muscle use.  Cardiovascular: S1 & S2 heard, regular rate and rhythm. No significant JVD. Abdomen: No distension, no tenderness, no masses palpated. Bowel sounds normal.  Musculoskeletal: no clubbing / cyanosis. No joint deformity upper and lower extremities.   Skin: no significant rashes, lesions, ulcers. Warm, dry, well-perfused. Neurologic: CN 2-12 grossly intact. Sensation intact. Strength 5/5 in all 4 limbs.  Psychiatric: Alert and oriented x 3. Pleasant, cooperative.     Labs on Admission: I have personally reviewed following labs and imaging studies  CBC:  Recent Labs Lab 03/03/17 0236  WBC 5.1  NEUTROABS 3.9  HGB 12.7  HCT 39.2  MCV 102.6*  PLT 983   Basic Metabolic Panel: No results for input(s): NA, K, CL, CO2, GLUCOSE, BUN, CREATININE, CALCIUM, MG, PHOS in the last 168 hours. GFR: Estimated Creatinine Clearance: 86.7 mL/min (by C-G formula based on SCr of 0.8 mg/dL). Liver Function Tests: No results for input(s): AST, ALT, ALKPHOS, BILITOT, PROT, ALBUMIN in the last 168 hours. No results for input(s):  LIPASE, AMYLASE in the last 168 hours. No results for input(s): AMMONIA in the last 168 hours. Coagulation Profile: No results for input(s): INR, PROTIME in the last 168 hours. Cardiac Enzymes: No results for input(s): CKTOTAL, CKMB, CKMBINDEX, TROPONINI in the last 168 hours. BNP (last 3 results) No results for input(s): PROBNP in the last 8760 hours. HbA1C: No results for input(s): HGBA1C in the last 72 hours. CBG: No results for input(s): GLUCAP in the last 168 hours. Lipid Profile: No results for input(s): CHOL, HDL, LDLCALC, TRIG, CHOLHDL, LDLDIRECT in the last 72 hours. Thyroid Function Tests: No results for input(s): TSH, T4TOTAL, FREET4, T3FREE, THYROIDAB in the last 72 hours. Anemia Panel: No results for input(s): VITAMINB12, FOLATE, FERRITIN, TIBC, IRON, RETICCTPCT in the last 72 hours. Urine analysis:    Component Value Date/Time   COLORURINE YELLOW 12/19/2016 0126   APPEARANCEUR HAZY (A) 12/19/2016 0126   LABSPEC 1.016 12/19/2016 0126   PHURINE 5.0 12/19/2016 0126   GLUCOSEU NEGATIVE 12/19/2016 0126   HGBUR NEGATIVE 12/19/2016 0126   BILIRUBINUR NEGATIVE 12/19/2016 0126   KETONESUR NEGATIVE 12/19/2016 0126   PROTEINUR NEGATIVE 12/19/2016 0126   NITRITE NEGATIVE 12/19/2016 0126   LEUKOCYTESUR NEGATIVE 12/19/2016 0126   Sepsis Labs: @LABRCNTIP (procalcitonin:4,lacticidven:4) )No results found for this or any previous visit (from the past 240 hour(s)).   Radiological Exams on Admission: Dg Chest 2 View  Result Date: 03/02/2017 CLINICAL DATA:  Cough. Worsening shortness of breath. Lung cancer on active immunotherapy. EXAM: CHEST  2 VIEW COMPARISON:  Most recent radiograph 12/18/2016, most recent CT 12/25/2016 FINDINGS: Progressive volume loss in the left hemithorax. Retrocardiac soft tissue density at the left lung base, previous CT demonstrated a occlusion of the left lower lobe bronchus and left lower lobe collapse. Progressive ill-defined opacities throughout the mid  and lower left hemithorax. Mild compensatory hyperinflation of the right lung. The right lung is clear. Limited assessment for pleural fluid. Leftward mediastinal shift secondary to atelectasis/collapse. No pulmonary edema. No pneumothorax. IMPRESSION: Progressive volume loss in left hemithorax, may be secondary to radiation change, however is nonspecific. No other new abnormality. Electronically Signed   By: Jeb Levering M.D.   On: 03/02/2017 23:56    EKG: Independently reviewed. Sinus  rhythm, right axis deviation.   Assessment/Plan  1. HCAP  - Pt presents with increasing dyspnea and worsening cough productive of thick green sputum  - CXR with radiation changes and difficult to exclude underlying infiltrate  - Treated with empiric vancomycin and cefepime in ED  - Plan to obtain blood and sputum cultures, continue empiric abx, add adjunctive glucocorticoid, continue supplemental O2 prn    2. Lung cancer  - Pt is under the care of oncology for [facility] involving the left lung  - She has been treated with immunotherapy and currently receiving immunotherapy   - Continue oncology follow-up   3. COPD with acute exacerbation  - Pt presents with increased dyspnea and increased cough and sputum production  - Check sputum culture and start systemic steroid, continue abx as above  - Continue ICS/LABA and prn albuterol    4. Atrial fibrillation  - In a sinus rhythm on admission  - CHADS-VASc is at least 78 (age, gender, HTN)  - INR subtherapeutic, will continue warfarin and bridge with heparin  - Continue flecainide and diltiazem     DVT prophylaxis: warfarin Code Status: Full  Family Communication: Discussed with patient Disposition Plan: Admit to telemetry Consults called: None Admission status: Inpatient    Vianne Bulls, MD [facility] Pager [phone]  If 7PM-7AM, please contact night-coverage [email or web address] Password [facility]  03/03/2017, 5:44 AM

## 2017-03-03 NOTE — ED Notes (Signed)
EKG seen and given to Dr Christy Gentles

## 2017-03-03 NOTE — ED Provider Notes (Signed)
[facility] EMERGENCY DEPARTMENT Provider Note   CSN: [id number] Arrival date & time: 03/02/17  2311     History   Chief Complaint Chief Complaint  Patient presents with  . Shortness of Breath    HPI Sarah [name] is a 66 y.o. female.  The history is provided by the patient.  Shortness of Breath  This is a new problem. The average episode lasts 48 hours. The problem occurs frequently.The problem has been gradually worsening. Associated symptoms include cough and sputum production. Pertinent negatives include no fever, no hemoptysis and no chest pain.   Patient with h/o lung CA, h/o DVT, on coumadin, h/o radiation pneumonitis, presents with worsening dyspnea over past 48 hrs.  She reports dyspnea on exertion and orthopnea.  No fever She reports coughing up thick/green sputum No hemoptysis No CP Past Medical History:  Diagnosis Date  . Arthritis   . Cancer ([place])    lung cancer  . Chronic anxiety    Patient describes as stress  . Chronic back pain   . DVT (deep venous thrombosis) ([place]) age 24  . GERD (gastroesophageal reflux disease)   . Goals of care, counseling/discussion 09/21/2016  . Headache(784.0)   . Hypoestrogenism   . Lower extremity venous stasis    LLE, chronic  . Obesity   . Odynophagia 11/13/2016  . Paroxysmal atrial fibrillation (HCC)   . PONV (postoperative nausea and vomiting)   . Pulmonary embolism ([place])   . Reflux   . Sleep apnea    wear CPAP  . Spinal stenosis   . Tobacco abuse   . Venous stasis   . Warfarin anticoagulation     Patient Active Problem List   Diagnosis Date Noted  . Chronic fatigue 02/13/2017  . Encounter for antineoplastic immunotherapy 02/13/2017  . Chronic obstructive pulmonary disease (COPD) ([place]) 02/11/2017  . HCAP (healthcare-associated pneumonia) 12/18/2016  . Odynophagia 11/13/2016  . Chemotherapy induced nausea and vomiting 10/12/2016  . Chemotherapy-induced nausea 10/12/2016  . Goals of care,  counseling/discussion 09/21/2016  . Encounter for antineoplastic chemotherapy 09/21/2016  . Squamous cell carcinoma of lung, left ([place]) 09/20/2016  . Lung mass 09/15/2016  . Arthritis 06/23/2013  . Atrial fibrillation, chronic ([place]) 08/30/2012  . Obesity, unspecified 08/30/2012  . Hypokalemia 08/30/2012  . Long term current use of anticoagulant therapy 08/03/2012  . OSA (obstructive sleep apnea) 05/23/2012  . History of pulmonary embolism 12/28/2011  . Tobacco abuse 12/28/2011  . Chronic anxiety 12/28/2011  . GERD (gastroesophageal reflux disease) 12/28/2011  . Warfarin anticoagulation 12/28/2011    Past Surgical History:  Procedure Laterality Date  . ANKLE SURGERY     x2  . APPENDECTOMY    . CARPAL TUNNEL RELEASE    . CHOLECYSTECTOMY    . OVARIAN CYST REMOVAL    . ROTATOR CUFF REPAIR    . TUBAL LIGATION    . VIDEO BRONCHOSCOPY WITH ENDOBRONCHIAL ULTRASOUND N/A 09/14/2016   Procedure: VIDEO BRONCHOSCOPY WITH ENDOBRONCHIAL ULTRASOUND;  Surgeon: Melrose Nakayama, MD;  Location: [facility] OR;  Service: Thoracic;  Laterality: N/A;    OB History    No data available       Home Medications    Prior to Admission medications   Medication Sig Start Date End Date Taking? Authorizing Provider  albuterol (PROVENTIL HFA;VENTOLIN HFA) 108 (90 Base) MCG/ACT inhaler Inhale 2 puffs into the lungs every 6 (six) hours as needed for wheezing or shortness of breath. 02/09/17   [name], MD  budesonide-formoterol [facility]) 160-4.5  MCG/ACT inhaler Inhale 2 puffs into the lungs 2 (two) times daily. 02/13/17   [name], MD  buPROPion (WELLBUTRIN SR) 150 MG 12 hr tablet TAKE 1 TABLET BY MOUTH TWICE DAILY **KEEP APPT ON 06/09/16** Patient taking differently: Take 150 mg by mouth daily. TAKE 1 TABLET BY MOUTH TWICE DAILY **KEEP APPT ON 06/09/16** 11/06/16   [name], MD  calcium carbonate (TUMS - DOSED IN MG ELEMENTAL CALCIUM) 500 MG chewable tablet Chew 1 tablet by mouth 2  (two) times a week.     [provider]  diltiazem (CARDIZEM CD) 180 MG 24 hr capsule TAKE ONE CAPSULE BY MOUTH EVERY DAY 10/12/16   [name], MD  flecainide (TAMBOCOR) 50 MG tablet TAKE 1 TABLET (50 MG TOTAL) BY MOUTH 2 (TWO) TIMES DAILY. 02/08/17   Satira Sark, MD  fluticasone-salmeterol (ADVAIR HFA) [phone] MCG/ACT inhaler Inhale 2 puffs into the lungs 2 (two) times daily. 02/09/17   [name], MD  HYDROcodone-acetaminophen [facility]) 7.5-325 MG tablet One tablet up to four times a day as needed for pain 11/29/16   Owens Shark, NP  ketoconazole (NIZORAL) 2 % cream Apply BID for rash 02/09/17   [name], MD  metoprolol tartrate (LOPRESSOR) 25 MG tablet TAKE 1 TABLET AS NEEDED FOR HEART RACING 01/08/17   [name], MD  ondansetron (ZOFRAN ODT) 4 MG disintegrating tablet Take 1 tablet by mouth every 6 hours as needed for nausea. 09/18/16   [name], MD  pantoprazole (PROTONIX) 40 MG tablet TAKE 1 TABLET EVERY MORNING 10/12/16   [name], MD  potassium chloride SA (K-DUR,KLOR-CON) 20 MEQ tablet Take 1 tablet (20 mEq total) by mouth once. 12/26/16 12/26/16  Curt Bears, MD  predniSONE (DELTASONE) 20 MG tablet Take 20 mg by mouth daily with breakfast.    [provider]  prochlorperazine (COMPAZINE) 10 MG tablet Take 1 tablet (10 mg total) by mouth every 6 (six) hours as needed for nausea or vomiting. 12/26/16   Curt Bears, MD  promethazine (PHENERGAN) 12.5 MG tablet Take 1 tablet (12.5 mg total) by mouth every 6 (six) hours as needed for nausea or vomiting. 10/30/16   Curt Bears, MD  sucralfate (CARAFATE) 1 g tablet Take 1 tablet (1 g total) by mouth 2 (two) times daily. 11/06/16   [name], MD  warfarin (COUMADIN) 5 MG tablet Take 2.5-5 mg by mouth daily. Patient takes 5mg  on Monday, Wednesday, and Saturday. One Tuesday, Thursday, Friday, and Sunday, patient takes 2.5mg     [provider]    Family  History Family History  Problem Relation Age of Onset  . Arrhythmia Mother   . Cancer Mother        colon  . Heart disease Mother   . Stroke Father        Deceased    Social History Social History  Substance Use Topics  . Smoking status: Former Smoker    Packs/day: 1.00    Years: 40.00    Types: Cigarettes    Quit date: 08/29/2016  . Smokeless tobacco: Never Used  . Alcohol use No     Allergies   Aspirin   Review of Systems Review of Systems  Constitutional: Negative for fever.  Respiratory: Positive for cough, sputum production and shortness of breath. Negative for hemoptysis.   Cardiovascular: Negative for chest pain.  All other systems reviewed and are negative.    Physical Exam Updated Vital Signs BP Marland Kitchen)  144/63 (BP Location: Right Arm)   Pulse 89   Temp 97.8 F (36.6 C) (Oral)   Resp 20   Ht 1.651 m (5\' 5" )   Wt 110.2 kg (243 lb)   SpO2 90%   BMI 40.44 kg/m   Physical Exam  CONSTITUTIONAL: Elderly, no acute distress HEAD: Normocephalic/atraumatic EYES: EOMI/PERRL ENMT: Mucous membranes moist NECK: supple no meningeal signs, no JVD SPINE/BACK:entire spine nontender CV: S1/S2 noted  LUNGS: decreased BS noted in left base, crackles noted, no distress noted ABDOMEN: soft, nontender  GU:no cva tenderness NEURO: Pt is awake/alert/appropriate, moves all extremitiesx4.  No facial droop.   EXTREMITIES: pulses normal/equal, full ROM, chronic edema to left LE noted SKIN: warm, color normal PSYCH: no abnormalities of mood noted, alert and oriented to situation  ED Treatments / Results  Labs (all labs ordered are listed, but only abnormal results are displayed) Labs Reviewed  CBC WITH DIFFERENTIAL/PLATELET - Abnormal; Notable for the following:       Result Value   RBC 3.82 (*)    MCV 102.6 (*)    Lymphs Abs 0.3 (*)    All other components within normal limits  PROTIME-INR - Abnormal; Notable for the following:    Prothrombin Time 15.4 (*)    All  other components within normal limits  BASIC METABOLIC PANEL - Abnormal; Notable for the following:    Glucose, Bld 120 (*)    All other components within normal limits  CULTURE, BLOOD (ROUTINE X 2)  CULTURE, BLOOD (ROUTINE X 2)  CULTURE, EXPECTORATED SPUTUM-ASSESSMENT  GRAM STAIN  BRAIN NATRIURETIC PEPTIDE  STREP PNEUMONIAE URINARY ANTIGEN  LEGIONELLA PNEUMOPHILA SEROGP 1 UR AG    EKG  EKG Interpretation  Date/Time:  Saturday March 03 2017 01:02:58 EDT Ventricular Rate:  85 PR Interval:    QRS Duration: 105 QT Interval:  402 QTC Calculation: 478 R Axis:   104 Text Interpretation:  Sinus rhythm Right axis deviation Low voltage, precordial leads Baseline wander in lead(s) III Artifact Confirmed by Ripley Fraise [phone]) on 03/03/2017 1:07:26 AM       Radiology Dg Chest 2 View  Result Date: 03/02/2017 CLINICAL DATA:  Cough. Worsening shortness of breath. Lung cancer on active immunotherapy. EXAM: CHEST  2 VIEW COMPARISON:  Most recent radiograph 12/18/2016, most recent CT 12/25/2016 FINDINGS: Progressive volume loss in the left hemithorax. Retrocardiac soft tissue density at the left lung base, previous CT demonstrated a occlusion of the left lower lobe bronchus and left lower lobe collapse. Progressive ill-defined opacities throughout the mid and lower left hemithorax. Mild compensatory hyperinflation of the right lung. The right lung is clear. Limited assessment for pleural fluid. Leftward mediastinal shift secondary to atelectasis/collapse. No pulmonary edema. No pneumothorax. IMPRESSION: Progressive volume loss in left hemithorax, may be secondary to radiation change, however is nonspecific. No other new abnormality. Electronically Signed   By: Jeb Levering M.D.   On: 03/02/2017 23:56    Procedures Procedures (including critical care time)  Medications Ordered in ED Medications  vancomycin (VANCOCIN) 2,000 mg in sodium chloride 0.9 % 500 mL IVPB (not administered)    mometasone-formoterol (DULERA) 200-5 MCG/ACT inhaler 2 puff (not administered)  flecainide (TAMBOCOR) tablet 50 mg (not administered)  HYDROcodone-acetaminophen (NORCO) 7.5-325 MG per tablet 1-2 tablet (not administered)  buPROPion (WELLBUTRIN SR) 12 hr tablet 150 mg (not administered)  diltiazem (CARDIZEM CD) 24 hr capsule 180 mg (not administered)  pantoprazole (PROTONIX) EC tablet 40 mg (not administered)  calcium carbonate (TUMS - dosed in mg  elemental calcium) chewable tablet 200 mg of elemental calcium (not administered)  ceFEPIme (MAXIPIME) 1 g in dextrose 5 % 50 mL IVPB (not administered)  methylPREDNISolone sodium succinate (SOLU-MEDROL) 40 mg/mL injection 40 mg (not administered)  albuterol (PROVENTIL) (2.5 MG/3ML) 0.083% nebulizer solution 2.5 mg (not administered)  albuterol (PROVENTIL) (2.5 MG/3ML) 0.083% nebulizer solution 5 mg (5 mg Nebulization Given 03/03/17 0034)  HYDROcodone-acetaminophen (NORCO/VICODIN) 5-325 MG per tablet 2 tablet (2 tablets Oral Given 03/03/17 0447)  ceFEPIme (MAXIPIME) 1 g in dextrose 5 % 50 mL IVPB (1 g Intravenous New Bag/Given 03/03/17 0546)     Initial Impression / Assessment and Plan / ED Course  I have reviewed the triage vital signs and the nursing notes.  Pertinent labs & imaging results that were available during my care of the patient were reviewed by me and considered in my medical decision making (see chart for details).     3:41 AM Concern for pneumonia, possibly post obstructive pneumonia 6:20 AM Pt stable D/w dr opyd for admission Will start broad spectrum antibiotics Pt awake/alert, no distress while at rest  Final Clinical Impressions(s) / ED Diagnoses   Final diagnoses:  Cough  HCAP (healthcare-associated pneumonia)    New Prescriptions New Prescriptions   No medications on file     Ripley Fraise, MD 03/03/17 [phone]

## 2017-03-03 NOTE — ED Notes (Signed)
Attempted to call report

## 2017-03-03 NOTE — Progress Notes (Signed)
ANTIBIOTIC CONSULT NOTE-Preliminary  Pharmacy Consult for Vancomycin Indication: Pneumonia  Allergies  Allergen Reactions  . Aspirin Nausea And Vomiting    Patient Measurements: Height: 5\' 5"  (165.1 cm) Weight: 243 lb (110.2 kg) IBW/kg (Calculated) : 57   Vital Signs: Temp: 97.8 F (36.6 C) (11/02 2317) Temp Source: Oral (11/02 2317) BP: 113/63 (11/03 0449) Pulse Rate: 81 (11/03 0449)  Labs:  Recent Labs  03/03/17 0236  WBC 5.1  HGB 12.7  PLT 194    Estimated Creatinine Clearance: 86.7 mL/min (by C-G formula based on SCr of 0.8 mg/dL).  No results for input(s): VANCOTROUGH, VANCOPEAK, VANCORANDOM, GENTTROUGH, GENTPEAK, GENTRANDOM, TOBRATROUGH, TOBRAPEAK, TOBRARND, AMIKACINPEAK, AMIKACINTROU, AMIKACIN in the last 72 hours.   Microbiology: Recent Results (from the past 720 hour(s))  TECHNOLOGIST REVIEW     Status: None   Collection Time: 02/13/17  9:24 AM  Result Value Ref Range Status   Technologist Review Oc meta and myelocytes  Final    Medical History: Past Medical History:  Diagnosis Date  . Arthritis   . Cancer ([place])    lung cancer  . Chronic anxiety    Patient describes as stress  . Chronic back pain   . DVT (deep venous thrombosis) ([place]) age 17  . GERD (gastroesophageal reflux disease)   . Goals of care, counseling/discussion 09/21/2016  . Headache(784.0)   . Hypoestrogenism   . Lower extremity venous stasis    LLE, chronic  . Obesity   . Odynophagia 11/13/2016  . Paroxysmal atrial fibrillation (H[facility])   . PONV (postoperative nausea and vomiting)   . Pulmonary embolism ([place])   . Reflux   . Sleep apnea    wear CPAP  . Spinal stenosis   . Tobacco abuse   . Venous stasis   . Warfarin anticoagulation     Medications:  Cefepime 1 Gm IV given in the ED  Assessment: 66 yo female with SCC of lung undergoing chemotherapy. Pt seen in the ED for worsening SOB and productive cough. Pharmacy has been consulted for Vancomycin dosing.  Goal of  Therapy:  Vancomycin troughs 15-20 mc/gml  Plan:  Preliminary review of pertinent patient information completed.  Protocol will be initiated with one dose of Vancomycin 2000 mg IV.  Forestine Na clinical pharmacist will complete review during morning rounds to assess patient and finalize treatment regimen if needed.  Norberto Sorenson, [facility] 03/03/2017,5:33 AM

## 2017-03-03 NOTE — Progress Notes (Signed)
Patient seen and examined, database reviewed.  Admitted earlier today due to cough, dyspnea.  Found to have pneumonia.  She is currently undergoing treatment for lung cancer.  Plan to continue antibiotics for now pending culture data.  She also has atrial fibrillation which is currently rate controlled, she is on warfarin and INR was subtherapeutic.  On admission was placed on a heparin bridge which I will discontinue.  We will continue to follow.  Domingo Mend, MD [facility] Pager: [phone]

## 2017-03-03 NOTE — Progress Notes (Signed)
[place] for Vancomycin Indication: Pneumonia  Allergies  Allergen Reactions  . Aspirin Nausea And Vomiting    Patient Measurements: Height: 5\' 5"  (165.1 cm) Weight: 243 lb (110.2 kg) IBW/kg (Calculated) : 57   Vital Signs: Temp: 98.1 F (36.7 C) (11/03 0854) Temp Source: Oral (11/03 0854) BP: 121/60 (11/03 0854) Pulse Rate: 78 (11/03 0854)  Labs:  Recent Labs  03/03/17 0236 03/03/17 0534  WBC 5.1  --   HGB 12.7  --   PLT 194  --   CREATININE  --  0.79    Estimated Creatinine Clearance: 86.7 mL/min (by C-G formula based on SCr of 0.79 mg/dL).  No results for input(s): VANCOTROUGH, VANCOPEAK, VANCORANDOM, GENTTROUGH, GENTPEAK, GENTRANDOM, TOBRATROUGH, TOBRAPEAK, TOBRARND, AMIKACINPEAK, AMIKACINTROU, AMIKACIN in the last 72 hours.   Microbiology: Recent Results (from the past 720 hour(s))  TECHNOLOGIST REVIEW     Status: None   Collection Time: 02/13/17  9:24 AM  Result Value Ref Range Status   Technologist Review Oc meta and myelocytes  Final  Culture, blood (routine x 2) Call MD if unable to obtain prior to antibiotics being given     Status: None (Preliminary result)   Collection Time: 03/03/17  5:54 AM  Result Value Ref Range Status   Specimen Description RIGHT ANTECUBITAL  Final   Special Requests   Final    BOTTLES DRAWN AEROBIC AND ANAEROBIC Blood Culture results may not be optimal due to an inadequate volume of blood received in culture bottles   Culture PENDING  Incomplete   Report Status PENDING  Incomplete    Medical History: Past Medical History:  Diagnosis Date  . Arthritis   . Cancer ([place])    lung cancer  . Chronic anxiety    Patient describes as stress  . Chronic back pain   . DVT (deep venous thrombosis) ([place]) age 72  . GERD (gastroesophageal reflux disease)   . Goals of care, counseling/discussion 09/21/2016  . Headache(784.0)   . Hypoestrogenism   . Lower extremity venous stasis    LLE, chronic  .  Obesity   . Odynophagia 11/13/2016  . Paroxysmal atrial fibrillation (H[facility])   . PONV (postoperative nausea and vomiting)   . Pulmonary embolism ([place])   . Reflux   . Sleep apnea    wear CPAP  . Spinal stenosis   . Tobacco abuse   . Venous stasis   . Warfarin anticoagulation     Medications:  Cefepime 1 Gm IV given in the ED  Assessment: 66 yo female with SCC of lung undergoing chemotherapy. Pt seen in the ED for worsening SOB and productive cough. Pharmacy has been consulted for Vancomycin dosing. Pharmacy will also continue her coumadin dosing  Goal of Therapy:  Vancomycin troughs 15-20 mcg/ml INR 2-3  Plan:  Continue vancomycin 750 mg IV q12 hours Continue cefepime as ordered by MD Coumadin 5 mg po x 1 now Daily PT/INR  Tarquin Welcher Poteet, RPH 03/03/2017,10:58 AM

## 2017-03-04 ENCOUNTER — Other Ambulatory Visit: Payer: Self-pay

## 2017-03-04 LAB — PROTIME-INR
INR: 1.31
Prothrombin Time: 16.2 seconds — ABNORMAL HIGH (ref 11.4–15.2)

## 2017-03-04 MED ORDER — HEPARIN SODIUM (PORCINE) 5000 UNIT/ML IJ SOLN
5000.0000 [IU] | Freq: Three times a day (TID) | INTRAMUSCULAR | Status: DC
  Administered 2017-03-04 – 2017-03-05 (×3): 5000 [IU] via SUBCUTANEOUS
  Filled 2017-03-04 (×3): qty 1

## 2017-03-04 MED ORDER — WARFARIN SODIUM 5 MG PO TABS
5.0000 mg | ORAL_TABLET | Freq: Once | ORAL | Status: AC
  Administered 2017-03-04: 5 mg via ORAL
  Filled 2017-03-04: qty 1

## 2017-03-04 MED ORDER — AMOXICILLIN-POT CLAVULANATE 875-125 MG PO TABS
1.0000 | ORAL_TABLET | Freq: Two times a day (BID) | ORAL | Status: DC
  Administered 2017-03-04 – 2017-03-05 (×3): 1 via ORAL
  Filled 2017-03-04 (×3): qty 1

## 2017-03-04 NOTE — Progress Notes (Signed)
[place] for coumadin Indication: Atrial fibrillation  Allergies  Allergen Reactions  . Aspirin Nausea And Vomiting    Patient Measurements: Height: 5\' 5"  (165.1 cm) Weight: 243 lb (110.2 kg) IBW/kg (Calculated) : 57 HEPARIN DW (KG): 82.9   Vital Signs: Temp: 97.8 F (36.6 C) (11/04 0700) BP: 104/72 (11/04 0700) Pulse Rate: 92 (11/04 0700)  Labs: Recent Labs    03/03/17 0236 03/03/17 0533 03/03/17 0534 03/04/17 0604  HGB 12.7  --   --   --   HCT 39.2  --   --   --   PLT 194  --   --   --   APTT  --  27  --   --   LABPROT  --  15.4*  --  16.2*  INR  --  1.23  --  1.31  CREATININE  --   --  0.79  --    Estimated Creatinine Clearance: 86.7 mL/min (by C-G formula based on SCr of 0.79 mg/dL).  Medical History: Past Medical History:  Diagnosis Date  . Arthritis   . Cancer ([place])    lung cancer  . Chronic anxiety    Patient describes as stress  . Chronic back pain   . DVT (deep venous thrombosis) ([place]) age 64  . GERD (gastroesophageal reflux disease)   . Goals of care, counseling/discussion 09/21/2016  . Headache(784.0)   . Hypoestrogenism   . Lower extremity venous stasis    LLE, chronic  . Obesity   . Odynophagia 11/13/2016  . Paroxysmal atrial fibrillation (H[facility])   . PONV (postoperative nausea and vomiting)   . Pulmonary embolism ([place])   . Reflux   . Sleep apnea    wear CPAP  . Spinal stenosis   . Tobacco abuse   . Venous stasis   . Warfarin anticoagulation     Medications:  Warfarin   Assessment: 66 yo female with PMH sig for atrial fibrillation, on warfarin with subtherapeutic INR.   Goal of Therapy:  INR 2-3 Monitor platelets by anticoagulation protocol: Yes  Plan:  Coumadin 5mg  po today Daily PT/INR Monitor for bleeding complications  Tyson Masin Poteet, RPH 03/04/2017,8:58 AM

## 2017-03-04 NOTE — Progress Notes (Signed)
PROGRESS NOTE    Sarah [name]  HGD:[id number] DOB: Sep 18, 1950 DOA: 03/03/2017 PCP: [name], MD     Brief Narrative:  66 year old woman admitted from home on 11/3 due to productive cough and dyspnea.  She has a history of cancer of the lung status post chemoradiation and now on immunotherapy as well as COPD.  Symptoms have been progressing over the past 48 hours.  She was found to have hospital-acquired pneumonia and possibly acute COPD and was admitted to the hospital for further evaluation.   Assessment & Plan:   Principal Problem:   HCAP (healthcare-associated pneumonia) Active Problems:   History of pulmonary embolism   Warfarin anticoagulation   OSA (obstructive sleep apnea)   Atrial fibrillation, chronic (HCC)   Squamous cell carcinoma of lung, left (HCC)   Chronic obstructive pulmonary disease (COPD) (HCC)   Acute on chronic respiratory failure -On account of hospital-acquired pneumonia and possibly COPD with acute exacerbation. -She is currently nontoxic, culture data remains negative.  Will take off vancomycin and cefepime and place on Augmentin for total of 8 days. -She has no wheezing on chest auscultation, she has only been on steroids for 2 days, will discontinue.  Atrial fibrillation -Rate controlled at present, continue warfarin, INR is subtherapeutic.  Pharmacy dosing.  Lung cancer -Continue outpatient follow-up with oncology as scheduled.   DVT prophylaxis: Subcu heparin Code Status: Full code Family Communication: Patient only Disposition Plan: Anticipate discharge home in 24 hours  Consultants:   None  Procedures:   None  Antimicrobials:  Anti-infectives (From admission, onward)   Start     Dose/Rate Route Frequency Ordered Stop   03/04/17 1030  amoxicillin-clavulanate (AUGMENTIN) 875-125 MG per tablet 1 tablet     1 tablet Oral Every 12 hours 03/04/17 1018 03/12/17 0959   03/03/17 2000  vancomycin (VANCOCIN) IVPB 750 mg/150 ml  premix  Status:  Discontinued     750 mg 150 mL/hr over 60 Minutes Intravenous Every 12 hours 03/03/17 1101 03/04/17 1018   03/03/17 1330  ceFEPIme (MAXIPIME) 1 g in dextrose 5 % 50 mL IVPB  Status:  Discontinued     1 g 100 mL/hr over 30 Minutes Intravenous Every 8 hours 03/03/17 0543 03/04/17 1018   03/03/17 0800  vancomycin (VANCOCIN) IVPB 1000 mg/200 mL premix     1,000 mg 200 mL/hr over 60 Minutes Intravenous Every 1 hr x 2 03/03/17 0746 03/03/17 1122   03/03/17 0600  vancomycin (VANCOCIN) 2,000 mg in sodium chloride 0.9 % 500 mL IVPB  Status:  Discontinued     2,000 mg 250 mL/hr over 120 Minutes Intravenous  Once 03/03/17 0532 03/03/17 0746   03/03/17 0530  ceFEPIme (MAXIPIME) 1 g in dextrose 5 % 50 mL IVPB     1 g 100 mL/hr over 30 Minutes Intravenous  Once 03/03/17 0519 03/03/17 0636       Subjective: Feels well, no complaints, shortness of breath is back to baseline.  Objective: Vitals:   03/04/17 0308 03/04/17 0700 03/04/17 0923 03/04/17 1327  BP:  104/72  107/65  Pulse: 94 92  89  Resp: 20 18  18   Temp:  97.8 F (36.6 C)  98.4 F (36.9 C)  TempSrc:    Oral  SpO2: 92% 98% 97% 97%  Weight:      Height:        Intake/Output Summary (Last 24 hours) at 03/04/2017 1355 Last data filed at 03/04/2017 0900 Gross per 24 hour  Intake 290 ml  Output -  Net 290 ml   Filed Weights   03/02/17 2318 03/03/17 0854  Weight: 110.2 kg (243 lb) 110.2 kg (243 lb)    Examination:  General exam: Alert, awake, oriented x 3 Respiratory system: Clear to auscultation. Respiratory effort normal. Cardiovascular system:RRR. No murmurs, rubs, gallops. Gastrointestinal system: Abdomen is nondistended, soft and nontender. No organomegaly or masses felt. Normal bowel sounds heard. Central nervous system: Alert and oriented. No focal neurological deficits. Extremities: No C/C/E, +pedal pulses Skin: No rashes, lesions or ulcers Psychiatry: Judgement and insight appear normal. Mood &  affect appropriate.     Data Reviewed: I have personally reviewed following labs and imaging studies  CBC: Recent Labs  Lab 03/03/17 0236  WBC 5.1  NEUTROABS 3.9  HGB 12.7  HCT 39.2  MCV 102.6*  PLT 778   Basic Metabolic Panel: Recent Labs  Lab 03/03/17 0534  NA 142  K 3.7  CL 103  CO2 30  GLUCOSE 120*  BUN 8  CREATININE 0.79  CALCIUM 9.2   GFR: Estimated Creatinine Clearance: 86.7 mL/min (by C-G formula based on SCr of 0.79 mg/dL). Liver Function Tests: No results for input(s): AST, ALT, ALKPHOS, BILITOT, PROT, ALBUMIN in the last 168 hours. No results for input(s): LIPASE, AMYLASE in the last 168 hours. No results for input(s): AMMONIA in the last 168 hours. Coagulation Profile: Recent Labs  Lab 03/03/17 0533 03/04/17 0604  INR 1.23 1.31   Cardiac Enzymes: No results for input(s): CKTOTAL, CKMB, CKMBINDEX, TROPONINI in the last 168 hours. BNP (last 3 results) No results for input(s): PROBNP in the last 8760 hours. HbA1C: No results for input(s): HGBA1C in the last 72 hours. CBG: No results for input(s): GLUCAP in the last 168 hours. Lipid Profile: No results for input(s): CHOL, HDL, LDLCALC, TRIG, CHOLHDL, LDLDIRECT in the last 72 hours. Thyroid Function Tests: No results for input(s): TSH, T4TOTAL, FREET4, T3FREE, THYROIDAB in the last 72 hours. Anemia Panel: No results for input(s): VITAMINB12, FOLATE, FERRITIN, TIBC, IRON, RETICCTPCT in the last 72 hours. Urine analysis:    Component Value Date/Time   COLORURINE YELLOW 12/19/2016 0126   APPEARANCEUR HAZY (A) 12/19/2016 0126   LABSPEC 1.016 12/19/2016 0126   PHURINE 5.0 12/19/2016 0126   GLUCOSEU NEGATIVE 12/19/2016 0126   HGBUR NEGATIVE 12/19/2016 0126   BILIRUBINUR NEGATIVE 12/19/2016 0126   KETONESUR NEGATIVE 12/19/2016 0126   PROTEINUR NEGATIVE 12/19/2016 0126   NITRITE NEGATIVE 12/19/2016 0126   LEUKOCYTESUR NEGATIVE 12/19/2016 0126   Sepsis  Labs: @LABRCNTIP (procalcitonin:4,lacticidven:4)  ) Recent Results (from the past 240 hour(s))  Culture, blood (routine x 2) Call MD if unable to obtain prior to antibiotics being given     Status: None (Preliminary result)   Collection Time: 03/03/17  5:54 AM  Result Value Ref Range Status   Specimen Description RIGHT ANTECUBITAL  Final   Special Requests   Final    BOTTLES DRAWN AEROBIC AND ANAEROBIC Blood Culture results may not be optimal due to an inadequate volume of blood received in culture bottles   Culture NO GROWTH 1 DAY  Final   Report Status PENDING  Incomplete  Culture, blood (routine x 2) Call MD if unable to obtain prior to antibiotics being given     Status: None (Preliminary result)   Collection Time: 03/03/17  6:09 AM  Result Value Ref Range Status   Specimen Description BLOOD  Final   Special Requests Immunocompromised  Final   Culture NO GROWTH 1 DAY  Final  Report Status PENDING  Incomplete         Radiology Studies: Dg Chest 2 View  Result Date: 03/02/2017 CLINICAL DATA:  Cough. Worsening shortness of breath. Lung cancer on active immunotherapy. EXAM: CHEST  2 VIEW COMPARISON:  Most recent radiograph 12/18/2016, most recent CT 12/25/2016 FINDINGS: Progressive volume loss in the left hemithorax. Retrocardiac soft tissue density at the left lung base, previous CT demonstrated a occlusion of the left lower lobe bronchus and left lower lobe collapse. Progressive ill-defined opacities throughout the mid and lower left hemithorax. Mild compensatory hyperinflation of the right lung. The right lung is clear. Limited assessment for pleural fluid. Leftward mediastinal shift secondary to atelectasis/collapse. No pulmonary edema. No pneumothorax. IMPRESSION: Progressive volume loss in left hemithorax, may be secondary to radiation change, however is nonspecific. No other new abnormality. Electronically Signed   By: Jeb Levering M.D.   On: 03/02/2017 23:56         Scheduled Meds: . amoxicillin-clavulanate  1 tablet Oral Q12H  . buPROPion  150 mg Oral Daily  . [START ON 03/05/2017] calcium carbonate  1 tablet Oral Once per day on Mon Thu  . diltiazem  180 mg Oral Daily  . flecainide  50 mg Oral Q12H  . mometasone-formoterol  2 puff Inhalation BID  . pantoprazole  40 mg Oral q morning - 10a  . warfarin  5 mg Oral Once  . Warfarin - Pharmacist Dosing Inpatient   Does not apply q1800   Continuous Infusions:   LOS: 1 day    Time spent: 35 minutes. Greater than 50% of this time was spent in direct contact with the patient coordinating care.     Lelon Frohlich, MD [facility] Pager [phone]  If 7PM-7AM, please contact night-coverage [email or web address] Password [email or web address] 03/04/2017, 1:55 PM

## 2017-03-05 ENCOUNTER — Telehealth: Payer: Self-pay | Admitting: *Deleted

## 2017-03-05 LAB — PROTIME-INR
INR: 1.6
PROTHROMBIN TIME: 18.9 s — AB (ref 11.4–15.2)

## 2017-03-05 LAB — STREP PNEUMONIAE URINARY ANTIGEN: STREP PNEUMO URINARY ANTIGEN: NEGATIVE

## 2017-03-05 MED ORDER — AMOXICILLIN-POT CLAVULANATE 875-125 MG PO TABS: 1.0000 | ORAL_TABLET | Freq: Two times a day (BID) | ORAL | 0 refills | Status: DC

## 2017-03-05 MED ORDER — WARFARIN - PHARMACIST DOSING INPATIENT: Status: DC

## 2017-03-05 MED ORDER — WARFARIN SODIUM 2.5 MG PO TABS: 2.5000 mg | ORAL_TABLET | Freq: Once | ORAL | Status: DC

## 2017-03-05 NOTE — Telephone Encounter (Signed)
Reviewed with MD, Pt will not need labs tomorrow, pt to come in for Provider visit, will not be getting treatment. Message to scheduling to cancel lab appt 11/6.

## 2017-03-05 NOTE — Care Management Important Message (Signed)
Important Message  Patient Details  Name: DILARA NAVARRETE MRN: [id number] Date of Birth: 1951/01/22   Medicare Important Message Given:  Yes    Yasuo Phimmasone, Chauncey Reading, RN 03/05/2017, 10:14 AM

## 2017-03-05 NOTE — Discharge Summary (Signed)
Physician Discharge Summary  Sarah [name] KPT:[id number] DOB: 1950-06-17 DOA: 03/03/2017  PCP: [name], MD  Admit date: 03/03/2017 Discharge date: 03/05/2017  Time spent: 45 minutes  Recommendations for Outpatient Follow-up:  -Will be discharged home today. -Advised to follow up with PCP in 2 weeks.   Discharge Diagnoses:  Principal Problem:   HCAP (healthcare-associated pneumonia) Active Problems:   History of pulmonary embolism   Warfarin anticoagulation   OSA (obstructive sleep apnea)   Atrial fibrillation, chronic (HCC)   Squamous cell carcinoma of lung, left (HCC)   Chronic obstructive pulmonary disease (COPD) ([place])   Discharge Condition: Stable and improved  Filed Weights   03/02/17 2318 03/03/17 0854  Weight: 110.2 kg (243 lb) 110.2 kg (243 lb)    History of present illness:  As per Dr. Myna Hidalgo on 11/3: Sarah [name] is a 66 y.o. female with medical history significant for atrial fibrillation on warfarin, cancer of the left lung status post chemoradiation and now on immunotherapy, COPD, hypertension, depression with anxiety, and GERD, now presenting to the emergency department with dyspnea and productive cough.  The patient reports that she had been in her usual state until approximately 48 hours ago when she noted the insidious development of worsening dyspnea and worsening cough, productive of thick green sputum.  Since that time, her condition has continuously worsened.  She describes her symptoms as similar to when she has developed pneumonia in the past.  ED Course: Upon arrival to the ED, patient is found to be afebrile, saturating 90% on room air, and with vitals otherwise stable.  EKG features a sinus rhythm with right axis deviation and chest x-ray is notable for progressive volume loss in the left hemithorax, possibly secondary to radiation changes.  CBC is notable for macrocytosis without anemia.  INR is subtherapeutic at 1.23, and BMP remains  pending at this time.  Patient was treated with empiric vancomycin and cefepime, as well as albuterol in the emergency department.  She is placed on supplemental oxygen.  Remains hemodynamically stable, not in acute distress while at rest, and will be admitted to the telemetry unit for ongoing evaluation and management of progressive dyspnea and productive cough concerning for pneumonia.    Hospital Course:   Acute on chronic respiratory failure -On account of hospital-acquired pneumonia and possibly COPD with acute exacerbation. -She is currently nontoxic, culture data remains negative.  DC on augmentin for 5 days to complete 8 days of treatment. -She has no wheezing on chest auscultation, she has only been on steroids for 2 days, will discontinue.  Atrial fibrillation -Rate controlled at present, continue warfarin, INR is subtherapeutic.   -Advised to follow up with the provider that does her coumadin levels in 2 days.  Lung cancer -Continue outpatient follow-up with oncology as scheduled.    Procedures:  None   Consultations:  None  Discharge Instructions  Discharge Instructions    Diet - low sodium heart healthy   Complete by:  As directed    Increase activity slowly   Complete by:  As directed      Allergies as of 03/05/2017      Reactions   Aspirin Nausea And Vomiting      Medication List    STOP taking these medications   potassium chloride SA 20 MEQ tablet Commonly known as:  K-DUR,KLOR-CON     TAKE these medications   albuterol 108 (90 Base) MCG/ACT inhaler Commonly known as:  PROVENTIL HFA;VENTOLIN HFA Inhale  2 puffs into the lungs every 6 (six) hours as needed for wheezing or shortness of breath.   amoxicillin-clavulanate 875-125 MG tablet Commonly known as:  AUGMENTIN Take 1 tablet every 12 (twelve) hours by mouth.   budesonide-formoterol 160-4.5 MCG/ACT inhaler Commonly known as:  SYMBICORT Inhale 2 puffs into the lungs 2 (two) times daily.     buPROPion 150 MG 12 hr tablet Commonly known as:  WELLBUTRIN SR TAKE 1 TABLET BY MOUTH TWICE DAILY **KEEP APPT ON 06/09/16** What changed:    how much to take  how to take this  when to take this  additional instructions   calcium carbonate 500 MG chewable tablet Commonly known as:  TUMS - dosed in mg elemental calcium Chew 1 tablet by mouth 2 (two) times a week.   diltiazem 180 MG 24 hr capsule Commonly known as:  CARDIZEM CD TAKE ONE CAPSULE BY MOUTH EVERY DAY   flecainide 50 MG tablet Commonly known as:  TAMBOCOR TAKE 1 TABLET (50 MG TOTAL) BY MOUTH 2 (TWO) TIMES DAILY.   HYDROcodone-acetaminophen 7.5-325 MG tablet Commonly known as:  NORCO One tablet up to four times a day as needed for pain What changed:    how much to take  how to take this  when to take this  reasons to take this  additional instructions   ketoconazole 2 % cream Commonly known as:  NIZORAL Apply BID for rash   metoprolol tartrate 25 MG tablet Commonly known as:  LOPRESSOR TAKE 1 TABLET AS NEEDED FOR HEART RACING   pantoprazole 40 MG tablet Commonly known as:  PROTONIX TAKE 1 TABLET EVERY MORNING   prochlorperazine 10 MG tablet Commonly known as:  COMPAZINE Take 1 tablet (10 mg total) by mouth every 6 (six) hours as needed for nausea or vomiting.   promethazine 12.5 MG tablet Commonly known as:  PHENERGAN Take 1 tablet (12.5 mg total) by mouth every 6 (six) hours as needed for nausea or vomiting.   sucralfate 1 g tablet Commonly known as:  CARAFATE Take 1 tablet (1 g total) by mouth 2 (two) times daily. What changed:    when to take this  reasons to take this   warfarin 5 MG tablet Commonly known as:  COUMADIN Take 0-2.5 mg daily by mouth. Take 2.5 mg Daily except none on Monday and Thursday.      Allergies  Allergen Reactions  . Aspirin Nausea And Vomiting   Follow-up Information    [name], MD. Schedule an appointment as soon as possible for a visit  on 03/08/2017.   Specialty:  Family Medicine Why:  1:40 pm Contact information: [place] [address] [phone]            The results of significant diagnostics from this hospitalization (including imaging, microbiology, ancillary and laboratory) are listed below for reference.    Significant Diagnostic Studies: Dg Chest 2 View  Result Date: 03/02/2017 CLINICAL DATA:  Cough. Worsening shortness of breath. Lung cancer on active immunotherapy. EXAM: CHEST  2 VIEW COMPARISON:  Most recent radiograph 12/18/2016, most recent CT 12/25/2016 FINDINGS: Progressive volume loss in the left hemithorax. Retrocardiac soft tissue density at the left lung base, previous CT demonstrated a occlusion of the left lower lobe bronchus and left lower lobe collapse. Progressive ill-defined opacities throughout the mid and lower left hemithorax. Mild compensatory hyperinflation of the right lung. The right lung is clear. Limited assessment for pleural fluid. Leftward mediastinal shift secondary to  atelectasis/collapse. No pulmonary edema. No pneumothorax. IMPRESSION: Progressive volume loss in left hemithorax, may be secondary to radiation change, however is nonspecific. No other new abnormality. Electronically Signed   By: Jeb Levering M.D.   On: 03/02/2017 23:56    Microbiology: Recent Results (from the past 240 hour(s))  Culture, blood (routine x 2) Call MD if unable to obtain prior to antibiotics being given     Status: None (Preliminary result)   Collection Time: 03/03/17  5:54 AM  Result Value Ref Range Status   Specimen Description RIGHT ANTECUBITAL  Final   Special Requests   Final    BOTTLES DRAWN AEROBIC AND ANAEROBIC Blood Culture results may not be optimal due to an inadequate volume of blood received in culture bottles   Culture NO GROWTH 2 DAYS  Final   Report Status PENDING  Incomplete  Culture, blood (routine x 2) Call MD if unable to obtain prior to antibiotics  being given     Status: None (Preliminary result)   Collection Time: 03/03/17  6:09 AM  Result Value Ref Range Status   Specimen Description BLOOD RIGHT HAND  Final   Special Requests   Final    BOTTLES DRAWN AEROBIC AND ANAEROBIC Blood Culture adequate volume   Culture NO GROWTH 2 DAYS  Final   Report Status PENDING  Incomplete     Labs: Basic Metabolic Panel: Recent Labs  Lab 03/03/17 0534  NA 142  K 3.7  CL 103  CO2 30  GLUCOSE 120*  BUN 8  CREATININE 0.79  CALCIUM 9.2   Liver Function Tests: No results for input(s): AST, ALT, ALKPHOS, BILITOT, PROT, ALBUMIN in the last 168 hours. No results for input(s): LIPASE, AMYLASE in the last 168 hours. No results for input(s): AMMONIA in the last 168 hours. CBC: Recent Labs  Lab 03/03/17 0236  WBC 5.1  NEUTROABS 3.9  HGB 12.7  HCT 39.2  MCV 102.6*  PLT 194   Cardiac Enzymes: No results for input(s): CKTOTAL, CKMB, CKMBINDEX, TROPONINI in the last 168 hours. BNP: BNP (last 3 results) Recent Labs    03/03/17 0533  BNP 18.0    ProBNP (last 3 results) No results for input(s): PROBNP in the last 8760 hours.  CBG: No results for input(s): GLUCAP in the last 168 hours.     SignedLelon Frohlich  [facility] Pager: [phone] 03/05/2017, 4:52 PM

## 2017-03-05 NOTE — Care Management Note (Signed)
Case Management Note  Patient Details  Name: Sarah [name] MRN: [id number] Date of Birth: [birth year]  Subjective/Objective:   Chart reviewed.  Adm with HCAP. History of lung cancer. She is ind PTA. No HH pta.  She has PCP, still drives to all her appointments. She has Medicare, reports no issues affording medications.                               Action/Plan: Anticipate DC home with self care.    Expected Discharge Date:      03/05/2017           Expected Discharge Plan:  Home/Self Care  In-House Referral:     Discharge planning Services  CM Consult  Post Acute Care Choice:    Choice offered to:  NA  DME Arranged:    DME Agency:     HH Arranged:    HH Agency:     Status of Service:  Completed, signed off  If discussed at [employer] of Stay Meetings, dates discussed:    Additional Comments:  Brevin Mcfadden, Chauncey Reading, RN 03/05/2017, 10:15 AM

## 2017-03-05 NOTE — Telephone Encounter (Signed)
"  I was discharged this morning with pneumonia.  Still on antibiotics.  Do I need to come in tomorrow for immunotherapy?"  Call transferred.

## 2017-03-05 NOTE — Progress Notes (Signed)
Patient is to be discharged home and in stable condition. IV and telemetry removed, WNL. Patient given discharge instructions and verbalized understanding. Patient will be escorted out by staff via wheelchair.  Celestia Khat, RN

## 2017-03-05 NOTE — Progress Notes (Signed)
[place] for coumadin Indication: Atrial fibrillation  Allergies  Allergen Reactions  . Aspirin Nausea And Vomiting   Patient Measurements: Height: 5\' 5"  (165.1 cm) Weight: 243 lb (110.2 kg) IBW/kg (Calculated) : 57 HEPARIN DW (KG): 82.9  Vital Signs: Temp: 97.6 F (36.4 C) (11/05 0618) Temp Source: Oral (11/05 0618) BP: 110/54 (11/05 0618) Pulse Rate: 73 (11/05 0618)  Labs: Recent Labs    03/03/17 0236 03/03/17 0533 03/03/17 0534 03/04/17 0604 03/05/17 0529  HGB 12.7  --   --   --   --   HCT 39.2  --   --   --   --   PLT 194  --   --   --   --   APTT  --  27  --   --   --   LABPROT  --  15.4*  --  16.2* 18.9*  INR  --  1.23  --  1.31 1.60  CREATININE  --   --  0.79  --   --    Estimated Creatinine Clearance: 86.7 mL/min (by C-G formula based on SCr of 0.79 mg/dL).  Medical History: Past Medical History:  Diagnosis Date  . Arthritis   . Cancer ([place])    lung cancer  . Chronic anxiety    Patient describes as stress  . Chronic back pain   . DVT (deep venous thrombosis) ([place]) age 21  . GERD (gastroesophageal reflux disease)   . Goals of care, counseling/discussion 09/21/2016  . Headache(784.0)   . Hypoestrogenism   . Lower extremity venous stasis    LLE, chronic  . Obesity   . Odynophagia 11/13/2016  . Paroxysmal atrial fibrillation (H[facility])   . PONV (postoperative nausea and vomiting)   . Pulmonary embolism ([place])   . Reflux   . Sleep apnea    wear CPAP  . Spinal stenosis   . Tobacco abuse   . Venous stasis   . Warfarin anticoagulation    Medications:  Medications Prior to Admission  Medication Sig Dispense Refill Last Dose  . albuterol (PROVENTIL HFA;VENTOLIN HFA) 108 (90 Base) MCG/ACT inhaler Inhale 2 puffs into the lungs every 6 (six) hours as needed for wheezing or shortness of breath. 1 Inhaler 5 unknown  . budesonide-formoterol (SYMBICORT) 160-4.5 MCG/ACT inhaler Inhale 2 puffs into the lungs 2 (two) times  daily. 1 Inhaler 12 03/02/2017 at Unknown time  . buPROPion (WELLBUTRIN SR) 150 MG 12 hr tablet TAKE 1 TABLET BY MOUTH TWICE DAILY **KEEP APPT ON 06/09/16** (Patient taking differently: Take 150 mg by mouth daily. TAKE 1 TABLET BY MOUTH TWICE DAILY **KEEP APPT ON 06/09/16**) 180 tablet 0 03/02/2017 at Unknown time  . calcium carbonate (TUMS - DOSED IN MG ELEMENTAL CALCIUM) 500 MG chewable tablet Chew 1 tablet by mouth 2 (two) times a week.    unknown  . diltiazem (CARDIZEM CD) 180 MG 24 hr capsule TAKE ONE CAPSULE BY MOUTH EVERY DAY 90 capsule 1 03/02/2017 at Unknown time  . flecainide (TAMBOCOR) 50 MG tablet TAKE 1 TABLET (50 MG TOTAL) BY MOUTH 2 (TWO) TIMES DAILY. 60 tablet 2 03/02/2017 at Unknown time  . HYDROcodone-acetaminophen (NORCO) 7.5-325 MG tablet One tablet up to four times a day as needed for pain (Patient taking differently: Take 1 tablet by mouth every 6 (six) hours as needed. ) 60 tablet 0 03/02/2017 at Unknown time  . ketoconazole (NIZORAL) 2 % cream Apply BID for rash 30 g 2 Past Month at  Unknown time  . metoprolol tartrate (LOPRESSOR) 25 MG tablet TAKE 1 TABLET AS NEEDED FOR HEART RACING 10 tablet 5 unknown  . pantoprazole (PROTONIX) 40 MG tablet TAKE 1 TABLET EVERY MORNING 90 tablet 1 03/02/2017 at Unknown time  . prochlorperazine (COMPAZINE) 10 MG tablet Take 1 tablet (10 mg total) by mouth every 6 (six) hours as needed for nausea or vomiting. 30 tablet 0 03/02/2017 at Unknown time  . promethazine (PHENERGAN) 12.5 MG tablet Take 1 tablet (12.5 mg total) by mouth every 6 (six) hours as needed for nausea or vomiting. 30 tablet 0 unknown  . sucralfate (CARAFATE) 1 g tablet Take 1 tablet (1 g total) by mouth 2 (two) times daily. (Patient taking differently: Take 1 g by mouth 2 (two) times daily as needed (stomach). ) 60 tablet 0 unknown  . warfarin (COUMADIN) 5 MG tablet Take 0-2.5 mg daily by mouth. Take 2.5 mg Daily except none on Monday and Thursday.   03/02/2017 at 1900  . potassium  chloride SA (K-DUR,KLOR-CON) 20 MEQ tablet Take 1 tablet (20 mEq total) by mouth once. 10 tablet 0    Assessment: 66 yo female with PMH sig for atrial fibrillation, on warfarin with subtherapeutic INR.   Goal of Therapy:  INR 2-3 Monitor platelets by anticoagulation protocol: Yes  Plan:  Coumadin 2.5mg  po today Daily PT/INR Monitor for bleeding complications  Pricilla Larsson, [facility] 03/05/2017,10:56 AM

## 2017-03-06 ENCOUNTER — Encounter: Payer: Self-pay | Admitting: Oncology

## 2017-03-06 ENCOUNTER — Other Ambulatory Visit: Payer: Medicare Other

## 2017-03-06 ENCOUNTER — Ambulatory Visit (HOSPITAL_BASED_OUTPATIENT_CLINIC_OR_DEPARTMENT_OTHER): Payer: Medicare Other | Admitting: Oncology

## 2017-03-06 ENCOUNTER — Ambulatory Visit: Payer: Medicare Other

## 2017-03-06 ENCOUNTER — Encounter: Payer: Self-pay | Admitting: *Deleted

## 2017-03-06 ENCOUNTER — Telehealth: Payer: Self-pay | Admitting: *Deleted

## 2017-03-06 ENCOUNTER — Telehealth: Payer: Self-pay | Admitting: Internal Medicine

## 2017-03-06 VITALS — BP 105/67 | HR 79 | Temp 98.3°F | Resp 18 | Ht 65.0 in | Wt 248.1 lb

## 2017-03-06 DIAGNOSIS — C3492 Malignant neoplasm of unspecified part of left bronchus or lung: Secondary | ICD-10-CM

## 2017-03-06 DIAGNOSIS — R0609 Other forms of dyspnea: Secondary | ICD-10-CM

## 2017-03-06 DIAGNOSIS — C3432 Malignant neoplasm of lower lobe, left bronchus or lung: Secondary | ICD-10-CM | POA: Diagnosis not present

## 2017-03-06 DIAGNOSIS — Z5112 Encounter for antineoplastic immunotherapy: Secondary | ICD-10-CM

## 2017-03-06 DIAGNOSIS — R05 Cough: Secondary | ICD-10-CM | POA: Diagnosis not present

## 2017-03-06 MED ORDER — LIDOCAINE-PRILOCAINE 2.5-2.5 % EX CREA: 1.0000 "application " | TOPICAL_CREAM | CUTANEOUS | 2 refills | Status: DC | PRN

## 2017-03-06 NOTE — Telephone Encounter (Signed)
Call from IR, [facility] placement scheduled for 11/19, pt to hold Coumadin x 4 days prior. Called pt with above information, pt advised she will proceed with treatment on 11/13 ( PIV), PAC to be placed 11/19. Pt will hold Coumadin 4 days prior. No further concerns.

## 2017-03-06 NOTE — Telephone Encounter (Signed)
Gave avs and calendar for November and December

## 2017-03-06 NOTE — Progress Notes (Signed)
[facility] OFFICE PROGRESS NOTE  [name], MD [place] [address]  DIAGNOSIS: Unresectable stage IIIA(T4, N0, M0) non-small cell lung cancer, squamous cell carcinoma diagnosed in May 2018 and presented with large obstructing left lower lobe lung mass suspicious small mediastinal lymphadenopathy.  PRIOR THERAPY: A course of concurrent chemoradiation with weekly carboplatin for AUC of 2 and paclitaxel 45 MG/M2. Status post 5 cycles. She received her radiation in [facility]. Last dose of chemotherapy was given 11/27/2016.Marland Kitchen  CURRENT THERAPY: consolidation immunotherapy with Imfinzi (Durvalumab) 10 MG/KG every 2 weeks, first dose 02/21/2017.  Status post 1 cycle.  INTERVAL HISTORY: Sarah [name] 66 y.o. female returns for routine follow-up visit accompanied by her husband.  Since her last visit, patient was admitted to the hospital for pneumonia.  The patient was having increased cough with green sputum production and shortness of breath.  She remains on Augmentin for several more days.  It is starting to feel better.  She has not had any fevers or chills.  She denies chest pain shortness of breath at rest and hemoptysis.  She still has a nonproductive cough and some dyspnea on exertion.  Denies nausea, vomiting, constipation, diarrhea.  The patient states that she has been having difficulty with people finding her veins and is requesting a Port-A-Cath to be placed.  The patient is here for evaluation prior to cycle 2 of her Imfinzi.  MEDICAL HISTORY: Past Medical History:  Diagnosis Date  . Arthritis   . Cancer ([facility])    lung cancer  . Chronic anxiety    Patient describes as stress  . Chronic back pain   . DVT (deep venous thrombosis) ([place]) age 41  . GERD (gastroesophageal reflux disease)   . Goals of care, counseling/discussion 09/21/2016  . Headache(784.0)   . Hypoestrogenism   . Lower extremity venous stasis    LLE, chronic  .  Obesity   . Odynophagia 11/13/2016  . Paroxysmal atrial fibrillation (HCC)   . PONV (postoperative nausea and vomiting)   . Pulmonary embolism ([place])   . Reflux   . Sleep apnea    wear CPAP  . Spinal stenosis   . Tobacco abuse   . Venous stasis   . Warfarin anticoagulation     ALLERGIES:  is allergic to aspirin.  MEDICATIONS:  Current Outpatient Medications  Medication Sig Dispense Refill  . albuterol (PROVENTIL HFA;VENTOLIN HFA) 108 (90 Base) MCG/ACT inhaler Inhale 2 puffs into the lungs every 6 (six) hours as needed for wheezing or shortness of breath. 1 Inhaler 5  . amoxicillin-clavulanate (AUGMENTIN) 875-125 MG tablet Take 1 tablet every 12 (twelve) hours by mouth. 10 tablet 0  . budesonide-formoterol (SYMBICORT) 160-4.5 MCG/ACT inhaler Inhale 2 puffs into the lungs 2 (two) times daily. 1 Inhaler 12  . buPROPion (WELLBUTRIN SR) 150 MG 12 hr tablet TAKE 1 TABLET BY MOUTH TWICE DAILY **KEEP APPT ON 06/09/16** (Patient taking differently: Take 150 mg by mouth daily. TAKE 1 TABLET BY MOUTH TWICE DAILY **KEEP APPT ON 06/09/16**) 180 tablet 0  . calcium carbonate (TUMS - DOSED IN MG ELEMENTAL CALCIUM) 500 MG chewable tablet Chew 1 tablet by mouth 2 (two) times a week.     . diltiazem (CARDIZEM CD) 180 MG 24 hr capsule TAKE ONE CAPSULE BY MOUTH EVERY DAY 90 capsule 1  . flecainide (TAMBOCOR) 50 MG tablet TAKE 1 TABLET (50 MG TOTAL) BY MOUTH 2 (TWO) TIMES DAILY. 60 tablet 2  .  HYDROcodone-acetaminophen (NORCO) 7.5-325 MG tablet One tablet up to four times a day as needed for pain (Patient taking differently: Take 1 tablet by mouth every 6 (six) hours as needed. ) 60 tablet 0  . pantoprazole (PROTONIX) 40 MG tablet TAKE 1 TABLET EVERY MORNING 90 tablet 1  . warfarin (COUMADIN) 5 MG tablet Take 0-2.5 mg daily by mouth. Take 2.5 mg Daily except none on Monday and Thursday.    Marland Kitchen ketoconazole (NIZORAL) 2 % cream Apply BID for rash (Patient not taking: Reported on 03/06/2017) 30 g 2  .  lidocaine-prilocaine (EMLA) cream Apply 1 application as needed topically. 30 g 2  . metoprolol tartrate (LOPRESSOR) 25 MG tablet TAKE 1 TABLET AS NEEDED FOR HEART RACING (Patient not taking: Reported on 03/06/2017) 10 tablet 5  . prochlorperazine (COMPAZINE) 10 MG tablet Take 1 tablet (10 mg total) by mouth every 6 (six) hours as needed for nausea or vomiting. 30 tablet 0  . promethazine (PHENERGAN) 12.5 MG tablet Take 1 tablet (12.5 mg total) by mouth every 6 (six) hours as needed for nausea or vomiting. 30 tablet 0  . sucralfate (CARAFATE) 1 g tablet Take 1 tablet (1 g total) by mouth 2 (two) times daily. (Patient not taking: Reported on 03/06/2017) 60 tablet 0   No current facility-administered medications for this visit.     SURGICAL HISTORY:  Past Surgical History:  Procedure Laterality Date  . ANKLE SURGERY     x2  . APPENDECTOMY    . CARPAL TUNNEL RELEASE    . CHOLECYSTECTOMY    . OVARIAN CYST REMOVAL    . ROTATOR CUFF REPAIR    . TUBAL LIGATION      REVIEW OF SYSTEMS:   Review of Systems  Constitutional: Negative for appetite change, chills, fatigue, fever and unexpected weight change.  HENT:   Negative for mouth sores, nosebleeds, sore throat and trouble swallowing.   Eyes: Negative for eye problems and icterus.  Respiratory: Negative forhemoptysis, shortness of breath and wheezing.  Positive for nonproductive cough and dyspnea on exertion. Cardiovascular: Negative for chest pain and leg swelling.  Gastrointestinal: Negative for abdominal pain, constipation, diarrhea, nausea and vomiting.  Genitourinary: Negative for bladder incontinence, difficulty urinating, dysuria, frequency and hematuria.   Musculoskeletal: Negative for back pain, gait problem, neck pain and neck stiffness.  Skin: Negative for itching and rash.  Neurological: Negative for dizziness, extremity weakness, gait problem, headaches, light-headedness and seizures.  Hematological: Negative for adenopathy. Does  not bruise/bleed easily.  Psychiatric/Behavioral: Negative for confusion, depression and sleep disturbance. The patient is not nervous/anxious.     PHYSICAL EXAMINATION:  Blood pressure 105/67, pulse 79, temperature 98.3 F (36.8 C), temperature source Oral, resp. rate 18, height 5\' 5"  (1.651 m), weight 248 lb 1.6 oz (112.5 kg), SpO2 93 %.  ECOG PERFORMANCE STATUS: 1 - Symptomatic but completely ambulatory  Physical Exam  Constitutional: Oriented to person, place, and time and well-developed, well-nourished, and in no distress. No distress.  HENT:  Head: Normocephalic and atraumatic.  Mouth/Throat: Oropharynx is clear and moist. No oropharyngeal exudate.  Eyes: Conjunctivae are normal. Right eye exhibits no discharge. Left eye exhibits no discharge. No scleral icterus.  Neck: Normal range of motion. Neck supple.  Cardiovascular: Normal rate, regular rhythm, normal heart sounds and intact distal pulses.   Pulmonary/Chest: Effort normal and breath sounds normal. No respiratory distress. No wheezes. No rales.  Abdominal: Soft. Bowel sounds are normal. Exhibits no distension and no mass. There is no tenderness.  Musculoskeletal: Normal range of motion. Exhibits no edema.  Lymphadenopathy:    No cervical adenopathy.  Neurological: Alert and oriented to person, place, and time. Exhibits normal muscle tone. Gait normal. Coordination normal.  Skin: Skin is warm and dry. No rash noted. Not diaphoretic. No erythema. No pallor.  Psychiatric: Mood, memory and judgment normal.  Vitals reviewed.  LABORATORY DATA: Lab Results  Component Value Date   WBC 5.1 03/03/2017   HGB 12.7 03/03/2017   HCT 39.2 03/03/2017   MCV 102.6 (H) 03/03/2017   PLT 194 03/03/2017      Chemistry      Component Value Date/Time   NA 142 03/03/2017 0534   NA 142 02/21/2017 1117   K 3.7 03/03/2017 0534   K 3.4 (L) 02/21/2017 1117   CL 103 03/03/2017 0534   CO2 30 03/03/2017 0534   CO2 24 02/21/2017 1117   BUN  8 03/03/2017 0534   BUN 7.4 02/21/2017 1117   CREATININE 0.79 03/03/2017 0534   CREATININE 0.8 02/21/2017 1117      Component Value Date/Time   CALCIUM 9.2 03/03/2017 0534   CALCIUM 9.4 02/21/2017 1117   ALKPHOS 91 02/21/2017 1117   AST 26 02/21/2017 1117   ALT 36 02/21/2017 1117   BILITOT 0.31 02/21/2017 1117       RADIOGRAPHIC STUDIES:  Dg Chest 2 View  Result Date: 03/02/2017 CLINICAL DATA:  Cough. Worsening shortness of breath. Lung cancer on active immunotherapy. EXAM: CHEST  2 VIEW COMPARISON:  Most recent radiograph 12/18/2016, most recent CT 12/25/2016 FINDINGS: Progressive volume loss in the left hemithorax. Retrocardiac soft tissue density at the left lung base, previous CT demonstrated a occlusion of the left lower lobe bronchus and left lower lobe collapse. Progressive ill-defined opacities throughout the mid and lower left hemithorax. Mild compensatory hyperinflation of the right lung. The right lung is clear. Limited assessment for pleural fluid. Leftward mediastinal shift secondary to atelectasis/collapse. No pulmonary edema. No pneumothorax. IMPRESSION: Progressive volume loss in left hemithorax, may be secondary to radiation change, however is nonspecific. No other new abnormality. Electronically Signed   By: Jeb Levering M.D.   On: 03/02/2017 23:56     ASSESSMENT/PLAN:  Squamous cell carcinoma of lung, left (HCC) This is a very pleasant 66 year old with unresectable a stage IIIa non-small cell lung cancer. The patient underwent a course of concurrent chemoradiation with weekly carboplatin and paclitaxel status post 8 cycles and has been tolerating this treatment fairly well except for mild fatigue and odynophagia. The patient is now on Imfinzi status post 1 cycle.  The patient was seen with Dr. Earlie Server.  She is recovering from her recent pneumonia.  Discussed with the patient and her husband that she can proceed with her Imfinzi today as scheduled.  The patient is  concerned about difficulty finding a vein and would prefer to have a Port-A-Cath placed prior to proceeding with any treatment.  In order for Port-A-Cath placement by interventional radiology was placed today.  We will delay her treatment by 1 week and adjust her future appointments accordingly.  A prescription for EMLA cream was sent to her local pharmacy.  The patient will be seen back for follow-up in approximately 3 weeks for evaluation prior to cycle #3 of Infinzi.  She was advised to call immediately if she has any concerning symptoms in the interval.  The patient voices understanding of current disease status and treatment options and is in agreement with the current care plan. All questions  were answered. The patient knows to call the clinic with any problems, questions or concerns. We can certainly see the patient much sooner if necessary.  Orders Placed This Encounter  Procedures  . IR FLUORO GUIDE PORT INSERTION LEFT    Standing Status:   Future    Standing Expiration Date:   05/06/2018    Order Specific Question:   Reason for Exam (SYMPTOM  OR DIAGNOSIS REQUIRED)    Answer:   PAC placement for chemo infusion    Order Specific Question:   Preferred Imaging Location?    Answer:   [place], [place], AGPCNP-BC, [place] 03/06/17  ADDENDUM: Hematology/oncology Attending: I had a face-to-face encounter with the patient today.  I recommended her care plan.  This is a very pleasant 66 years old white female with a stage IIIa non-small cell lung cancer status post a course of concurrent chemoradiation with weekly carboplatin and paclitaxel with partial response.  The patient is currently on treatment with consolidation immunotherapy with Imfinzi (Durvalumab) status post 1 cycle.  She tolerated the first cycle of her treatment well but unfortunately she was admitted recently to any pain hospital with questionable pneumonia.  She is currently on treatment with  antibiotics for her pneumonia. The patient was supposed to start cycle #2 today.  She is concerned about her recent pneumonia and would like to delay her treatment by 1 more week. I agreed to the request of the patient and she will start cycle #2 next week. In the meantime will arrange for the patient to have a Port-A-Cath placed for IV access. She will come back for follow-up visit in 3 weeks for evaluation before starting cycle #3. The patient was advised to call immediately if she has any concerning symptoms in the interval. Disclaimer: This note was dictated with voice recognition software. Similar sounding words can inadvertently be transcribed and may be missed upon review. Eilleen Kempf, MD 03/06/17

## 2017-03-06 NOTE — Assessment & Plan Note (Addendum)
This is a very pleasant 66 year old with unresectable a stage IIIa non-small cell lung cancer. The patient underwent a course of concurrent chemoradiation with weekly carboplatin and paclitaxel status post 8 cycles and has been tolerating this treatment fairly well except for mild fatigue and odynophagia. The patient is now on Imfinzi status post 1 cycle.  The patient was seen with Dr. Earlie Server.  She is recovering from her recent pneumonia.  Discussed with the patient and her husband that she can proceed with her Imfinzi today as scheduled.  The patient is concerned about difficulty finding a vein and would prefer to have a Port-A-Cath placed prior to proceeding with any treatment.  In order for Port-A-Cath placement by interventional radiology was placed today.  We will delay her treatment by 1 week and adjust her future appointments accordingly.  A prescription for EMLA cream was sent to her local pharmacy.  The patient will be seen back for follow-up in approximately 3 weeks for evaluation prior to cycle #3 of Infinzi.  She was advised to call immediately if she has any concerning symptoms in the interval.  The patient voices understanding of current disease status and treatment options and is in agreement with the current care plan. All questions were answered. The patient knows to call the clinic with any problems, questions or concerns. We can certainly see the patient much sooner if necessary.

## 2017-03-07 LAB — LEGIONELLA PNEUMOPHILA SEROGP 1 UR AG: L. pneumophila Serogp 1 Ur Ag: NEGATIVE

## 2017-03-08 ENCOUNTER — Encounter: Payer: Self-pay | Admitting: Family Medicine

## 2017-03-08 ENCOUNTER — Ambulatory Visit (INDEPENDENT_AMBULATORY_CARE_PROVIDER_SITE_OTHER): Payer: Medicare Other | Admitting: Family Medicine

## 2017-03-08 VITALS — BP 110/84 | Ht 67.0 in | Wt 243.0 lb

## 2017-03-08 DIAGNOSIS — Z23 Encounter for immunization: Secondary | ICD-10-CM | POA: Diagnosis not present

## 2017-03-08 DIAGNOSIS — Z7901 Long term (current) use of anticoagulants: Secondary | ICD-10-CM | POA: Diagnosis not present

## 2017-03-08 DIAGNOSIS — J449 Chronic obstructive pulmonary disease, unspecified: Secondary | ICD-10-CM

## 2017-03-08 LAB — CULTURE, BLOOD (ROUTINE X 2)
Culture: NO GROWTH
Culture: NO GROWTH
Special Requests: ADEQUATE

## 2017-03-08 LAB — POCT INR: INR: 2.1

## 2017-03-08 NOTE — Progress Notes (Signed)
   Subjective:    Patient ID: Sarah [name], female    DOB: 03/28/1951, 66 y.o.   MRN: [id number] Patient arrives office for follow-up transitional visit from hospital discharge.  Of note we communicated with patient on the day of discharge regarding appropriate use and appropriate follow-up.   HPI  Patient in today for a follow up on COPD exacerbation along with potential pneumonia..    Patient would also like to get pneumonia shot.   Results for orders placed or performed in visit on 03/08/17  POCT INR  Result Value Ref Range   INR 2.1     Patient has finished up her antibiotics.  Reports overall her breathing has returned to baseline.  Complete hospital record.  All progress notes.  All specialist notes.  All imaging and blood work results reviewed today in presence of patient.  Patient claims compliance with her Coumadin Review of Systems No headache, no major weight loss or weight gain, no chest pain no back pain abdominal pain no change in bowel habits complete ROS otherwise negative     Objective:   Physical Exam  Alert and oriented, vitals reviewed and stable, NAD ENT-TM's and ext canals WNL bilat via otoscopic exam Soft palate, tonsils and post pharynx WNL via oropharyngeal exam Neck-symmetric, no masses; thyroid nonpalpable and nontender Pulmonary-no tachypnea or accessory muscle use; Somewhat diminished breath sounds diffusely with very slight wheezes via auscultation Card--no abnrml murmurs, rhythm reg and rate WNL Carotid pulses symmetric, without bruits      Assessment & Plan:  Impression status post admission for pneumonia.  Along with exacerbation of COPD Long discussion held this may not represent a true pneumonia.  Appearance on chest x-ray consistent with also post radiation pneumonitis which patient has experienced.  This may just represent an exacerbation of COPD since we did not get a scan maintain same dose on INR.  Will as noted.  Prevnar shot  today.

## 2017-03-13 ENCOUNTER — Other Ambulatory Visit (HOSPITAL_BASED_OUTPATIENT_CLINIC_OR_DEPARTMENT_OTHER): Payer: Medicare Other

## 2017-03-13 ENCOUNTER — Ambulatory Visit (HOSPITAL_BASED_OUTPATIENT_CLINIC_OR_DEPARTMENT_OTHER): Payer: Medicare Other

## 2017-03-13 VITALS — BP 120/56 | HR 83 | Temp 98.2°F | Resp 20

## 2017-03-13 DIAGNOSIS — Z5112 Encounter for antineoplastic immunotherapy: Secondary | ICD-10-CM | POA: Diagnosis present

## 2017-03-13 DIAGNOSIS — C3432 Malignant neoplasm of lower lobe, left bronchus or lung: Secondary | ICD-10-CM

## 2017-03-13 DIAGNOSIS — C3492 Malignant neoplasm of unspecified part of left bronchus or lung: Secondary | ICD-10-CM

## 2017-03-13 DIAGNOSIS — Z79899 Other long term (current) drug therapy: Secondary | ICD-10-CM | POA: Diagnosis not present

## 2017-03-13 DIAGNOSIS — R5382 Chronic fatigue, unspecified: Secondary | ICD-10-CM

## 2017-03-13 LAB — COMPREHENSIVE METABOLIC PANEL
ALBUMIN: 3.3 g/dL — AB (ref 3.5–5.0)
ALK PHOS: 91 U/L (ref 40–150)
ALT: 16 U/L (ref 0–55)
ANION GAP: 9 meq/L (ref 3–11)
AST: 14 U/L (ref 5–34)
BILIRUBIN TOTAL: 0.3 mg/dL (ref 0.20–1.20)
BUN: 6.5 mg/dL — ABNORMAL LOW (ref 7.0–26.0)
CALCIUM: 9.5 mg/dL (ref 8.4–10.4)
CO2: 27 meq/L (ref 22–29)
Chloride: 105 mEq/L (ref 98–109)
Creatinine: 1 mg/dL (ref 0.6–1.1)
Glucose: 140 mg/dl (ref 70–140)
Potassium: 4 mEq/L (ref 3.5–5.1)
Sodium: 141 mEq/L (ref 136–145)
TOTAL PROTEIN: 7 g/dL (ref 6.4–8.3)

## 2017-03-13 LAB — CBC WITH DIFFERENTIAL/PLATELET
BASO%: 0.3 % (ref 0.0–2.0)
Basophils Absolute: 0 10*3/uL (ref 0.0–0.1)
EOS%: 0.9 % (ref 0.0–7.0)
Eosinophils Absolute: 0.1 10*3/uL (ref 0.0–0.5)
HEMATOCRIT: 41.9 % (ref 34.8–46.6)
HEMOGLOBIN: 13.4 g/dL (ref 11.6–15.9)
LYMPH#: 0.3 10*3/uL — AB (ref 0.9–3.3)
LYMPH%: 4.8 % — ABNORMAL LOW (ref 14.0–49.7)
MCH: 32.9 pg (ref 25.1–34.0)
MCHC: 32 g/dL (ref 31.5–36.0)
MCV: 102.9 fL — ABNORMAL HIGH (ref 79.5–101.0)
MONO#: 0.8 10*3/uL (ref 0.1–0.9)
MONO%: 12.2 % (ref 0.0–14.0)
NEUT#: 5.3 10*3/uL (ref 1.5–6.5)
NEUT%: 81.8 % — ABNORMAL HIGH (ref 38.4–76.8)
Platelets: 210 10*3/uL (ref 145–400)
RBC: 4.07 10*6/uL (ref 3.70–5.45)
RDW: 14.5 % (ref 11.2–14.5)
WBC: 6.5 10*3/uL (ref 3.9–10.3)

## 2017-03-13 LAB — TSH: TSH: 1.158 m[IU]/L (ref 0.308–3.960)

## 2017-03-13 MED ORDER — SODIUM CHLORIDE 0.9 % IV SOLN
10.2000 mg/kg | Freq: Once | INTRAVENOUS | Status: AC
  Administered 2017-03-13: 1120 mg via INTRAVENOUS
  Filled 2017-03-13: qty 20

## 2017-03-13 MED ORDER — SODIUM CHLORIDE 0.9 % IV SOLN
Freq: Once | INTRAVENOUS | Status: AC
  Administered 2017-03-13: 14:00:00 via INTRAVENOUS

## 2017-03-13 NOTE — Patient Instructions (Signed)
[facility] Discharge Instructions for Patients Receiving Chemotherapy  Today you received the following chemotherapy agents: Imfinzi.  To help prevent nausea and vomiting after your treatment, we encourage you to take your nausea medication as directed.   If you develop nausea and vomiting that is not controlled by your nausea medication, call the clinic.   BELOW ARE SYMPTOMS THAT SHOULD BE REPORTED IMMEDIATELY:  *FEVER GREATER THAN 100.5 F  *CHILLS WITH OR WITHOUT FEVER  NAUSEA AND VOMITING THAT IS NOT CONTROLLED WITH YOUR NAUSEA MEDICATION  *UNUSUAL SHORTNESS OF BREATH  *UNUSUAL BRUISING OR BLEEDING  TENDERNESS IN MOUTH AND THROAT WITH OR WITHOUT PRESENCE OF ULCERS  *URINARY PROBLEMS  *BOWEL PROBLEMS  UNUSUAL RASH Items with * indicate a potential emergency and should be followed up as soon as possible.  Feel free to call the clinic should you have any questions or concerns. The clinic phone number is [phone].  Please show the CHEMO ALERT CARD at check-in to the Emergency Department and triage nurse.   

## 2017-03-16 ENCOUNTER — Other Ambulatory Visit: Payer: Self-pay | Admitting: General Surgery

## 2017-03-19 ENCOUNTER — Encounter (HOSPITAL_COMMUNITY): Payer: Self-pay

## 2017-03-19 ENCOUNTER — Ambulatory Visit (HOSPITAL_COMMUNITY)
Admission: RE | Admit: 2017-03-19 | Discharge: 2017-03-19 | Disposition: A | Discharge status: Home / Self Care | Payer: Medicare Other | Source: Ambulatory Visit | Attending: Oncology | Admitting: Oncology

## 2017-03-19 ENCOUNTER — Other Ambulatory Visit: Payer: Self-pay | Admitting: *Deleted

## 2017-03-19 ENCOUNTER — Other Ambulatory Visit: Payer: Self-pay | Admitting: Oncology

## 2017-03-19 DIAGNOSIS — Z86718 Personal history of other venous thrombosis and embolism: Secondary | ICD-10-CM | POA: Diagnosis not present

## 2017-03-19 DIAGNOSIS — I480 Paroxysmal atrial fibrillation: Secondary | ICD-10-CM | POA: Insufficient documentation

## 2017-03-19 DIAGNOSIS — G4730 Sleep apnea, unspecified: Secondary | ICD-10-CM | POA: Diagnosis not present

## 2017-03-19 DIAGNOSIS — Z5112 Encounter for antineoplastic immunotherapy: Secondary | ICD-10-CM

## 2017-03-19 DIAGNOSIS — C3492 Malignant neoplasm of unspecified part of left bronchus or lung: Secondary | ICD-10-CM | POA: Insufficient documentation

## 2017-03-19 DIAGNOSIS — Z7901 Long term (current) use of anticoagulants: Secondary | ICD-10-CM | POA: Insufficient documentation

## 2017-03-19 DIAGNOSIS — F419 Anxiety disorder, unspecified: Secondary | ICD-10-CM | POA: Insufficient documentation

## 2017-03-19 DIAGNOSIS — Z7951 Long term (current) use of inhaled steroids: Secondary | ICD-10-CM | POA: Diagnosis not present

## 2017-03-19 DIAGNOSIS — Z452 Encounter for adjustment and management of vascular access device: Secondary | ICD-10-CM | POA: Diagnosis not present

## 2017-03-19 DIAGNOSIS — Z87891 Personal history of nicotine dependence: Secondary | ICD-10-CM | POA: Insufficient documentation

## 2017-03-19 DIAGNOSIS — M1990 Unspecified osteoarthritis, unspecified site: Secondary | ICD-10-CM | POA: Insufficient documentation

## 2017-03-19 DIAGNOSIS — M549 Dorsalgia, unspecified: Secondary | ICD-10-CM | POA: Insufficient documentation

## 2017-03-19 DIAGNOSIS — G8929 Other chronic pain: Secondary | ICD-10-CM | POA: Diagnosis not present

## 2017-03-19 DIAGNOSIS — E669 Obesity, unspecified: Secondary | ICD-10-CM | POA: Insufficient documentation

## 2017-03-19 DIAGNOSIS — K219 Gastro-esophageal reflux disease without esophagitis: Secondary | ICD-10-CM | POA: Insufficient documentation

## 2017-03-19 DIAGNOSIS — R160 Hepatomegaly, not elsewhere classified: Secondary | ICD-10-CM | POA: Diagnosis not present

## 2017-03-19 DIAGNOSIS — Z86711 Personal history of pulmonary embolism: Secondary | ICD-10-CM | POA: Insufficient documentation

## 2017-03-19 HISTORY — PX: IR US GUIDE VASC ACCESS RIGHT: IMG2390

## 2017-03-19 HISTORY — PX: IR FLUORO GUIDE PORT INSERTION RIGHT: IMG5741

## 2017-03-19 LAB — CBC WITH DIFFERENTIAL/PLATELET
BASOS ABS: 0 10*3/uL (ref 0.0–0.1)
Basophils Relative: 0 %
EOS PCT: 1 %
Eosinophils Absolute: 0.1 10*3/uL (ref 0.0–0.7)
HEMATOCRIT: 41.7 % (ref 36.0–46.0)
HEMOGLOBIN: 13.8 g/dL (ref 12.0–15.0)
LYMPHS ABS: 0.8 10*3/uL (ref 0.7–4.0)
LYMPHS PCT: 7 %
MCH: 33.2 pg (ref 26.0–34.0)
MCHC: 33.1 g/dL (ref 30.0–36.0)
MCV: 100.2 fL — AB (ref 78.0–100.0)
Monocytes Absolute: 0.3 10*3/uL (ref 0.1–1.0)
Monocytes Relative: 3 %
NEUTROS ABS: 9.1 10*3/uL — AB (ref 1.7–7.7)
NEUTROS PCT: 89 %
PLATELETS: 224 10*3/uL (ref 150–400)
RBC: 4.16 MIL/uL (ref 3.87–5.11)
RDW: 14.5 % (ref 11.5–15.5)
WBC: 10.3 10*3/uL (ref 4.0–10.5)

## 2017-03-19 LAB — PROTIME-INR
INR: 0.9
Prothrombin Time: 12.1 seconds (ref 11.4–15.2)

## 2017-03-19 MED ORDER — HEPARIN SOD (PORK) LOCK FLUSH 100 UNIT/ML IV SOLN
INTRAVENOUS | Status: AC | PRN
  Administered 2017-03-19: 500 [IU] via INTRAVENOUS

## 2017-03-19 MED ORDER — LIDOCAINE-EPINEPHRINE (PF) 2 %-1:200000 IJ SOLN
INTRAMUSCULAR | Status: AC
  Filled 2017-03-19: qty 20

## 2017-03-19 MED ORDER — SODIUM CHLORIDE 0.9 % IV SOLN
INTRAVENOUS | Status: DC
  Administered 2017-03-19: 08:00:00 via INTRAVENOUS

## 2017-03-19 MED ORDER — CEFAZOLIN SODIUM-DEXTROSE 2-4 GM/100ML-% IV SOLN
INTRAVENOUS | Status: AC
  Filled 2017-03-19: qty 100

## 2017-03-19 MED ORDER — LIDOCAINE HCL 1 % IJ SOLN
INTRAMUSCULAR | Status: AC
  Filled 2017-03-19: qty 20

## 2017-03-19 MED ORDER — HEPARIN SOD (PORK) LOCK FLUSH 100 UNIT/ML IV SOLN
INTRAVENOUS | Status: AC
  Filled 2017-03-19: qty 5

## 2017-03-19 MED ORDER — MIDAZOLAM HCL 2 MG/2ML IJ SOLN
INTRAMUSCULAR | Status: AC
  Filled 2017-03-19: qty 4

## 2017-03-19 MED ORDER — FENTANYL CITRATE (PF) 100 MCG/2ML IJ SOLN
INTRAMUSCULAR | Status: AC
  Filled 2017-03-19: qty 2

## 2017-03-19 MED ORDER — FENTANYL CITRATE (PF) 100 MCG/2ML IJ SOLN
INTRAMUSCULAR | Status: AC | PRN
  Administered 2017-03-19 (×2): 50 ug via INTRAVENOUS

## 2017-03-19 MED ORDER — MIDAZOLAM HCL 2 MG/2ML IJ SOLN
INTRAMUSCULAR | Status: AC | PRN
  Administered 2017-03-19 (×2): 1 mg via INTRAVENOUS

## 2017-03-19 MED ORDER — CEFAZOLIN SODIUM-DEXTROSE 2-4 GM/100ML-% IV SOLN
2.0000 g | INTRAVENOUS | Status: AC
  Administered 2017-03-19: 2 g via INTRAVENOUS

## 2017-03-19 MED ORDER — LIDOCAINE-EPINEPHRINE (PF) 1 %-1:200000 IJ SOLN
INTRAMUSCULAR | Status: AC | PRN
  Administered 2017-03-19: 20 mL

## 2017-03-19 NOTE — Discharge Instructions (Signed)
Implanted Port Home Guide An implanted port is a type of central line that is placed under the skin. Central lines are used to provide IV access when treatment or nutrition needs to be given through a person's veins. Implanted ports are used for long-term IV access. An implanted port may be placed because:  You need IV medicine that would be irritating to the small veins in your hands or arms.  You need long-term IV medicines, such as antibiotics.  You need IV nutrition for a long period.  You need frequent blood draws for lab tests.  You need dialysis.  Implanted ports are usually placed in the chest area, but they can also be placed in the upper arm, the abdomen, or the leg. An implanted port has two main parts:  Reservoir. The reservoir is round and will appear as a small, raised area under your skin. The reservoir is the part where a needle is inserted to give medicines or draw blood.  Catheter. The catheter is a thin, flexible tube that extends from the reservoir. The catheter is placed into a large vein. Medicine that is inserted into the reservoir goes into the catheter and then into the vein.  How will I care for my incision site? Do not get the incision site wet. Bathe or shower as directed by your health care provider. How is my port accessed? Special steps must be taken to access the port:  Before the port is accessed, a numbing cream can be placed on the skin. This helps numb the skin over the port site.  Your health care provider uses a sterile technique to access the port. ? Your health care provider must put on a mask and sterile gloves. ? The skin over your port is cleaned carefully with an antiseptic and allowed to dry. ? The port is gently pinched between sterile gloves, and a needle is inserted into the port.  Only "non-coring" port needles should be used to access the port. Once the port is accessed, a blood return should be checked. This helps ensure that the port  is in the vein and is not clogged.  If your port needs to remain accessed for a constant infusion, a clear (transparent) bandage will be placed over the needle site. The bandage and needle will need to be changed every week, or as directed by your health care provider.  Keep the bandage covering the needle clean and dry. Do not get it wet. Follow your health care provider's instructions on how to take a shower or bath while the port is accessed.  If your port does not need to stay accessed, no bandage is needed over the port.  What is flushing? Flushing helps keep the port from getting clogged. Follow your health care provider's instructions on how and when to flush the port. Ports are usually flushed with saline solution or a medicine called heparin. The need for flushing will depend on how the port is used.  If the port is used for intermittent medicines or blood draws, the port will need to be flushed: ? After medicines have been given. ? After blood has been drawn. ? As part of routine maintenance.  If a constant infusion is running, the port may not need to be flushed.  How long will my port stay implanted? The port can stay in for as long as your health care provider thinks it is needed. When it is time for the port to come out, surgery will be   done to remove it. The procedure is similar to the one performed when the port was put in. When should I seek immediate medical care? When you have an implanted port, you should seek immediate medical care if:  You notice a bad smell coming from the incision site.  You have swelling, redness, or drainage at the incision site.  You have more swelling or pain at the port site or the surrounding area.  You have a fever that is not controlled with medicine.  This information is not intended to replace advice given to you by your health care provider. Make sure you discuss any questions you have with your health care provider. Document  Released: 04/17/2005 Document Revised: 09/23/2015 Document Reviewed: 12/23/2012 Elsevier Interactive Patient Education  2017 [employer]  

## 2017-03-19 NOTE — Procedures (Signed)
Placement of right jugular port.  Tip at SVC/RA junction.  Minimal blood loss and no immediate complication.

## 2017-03-19 NOTE — H&P (Signed)
Chief Complaint: Patient was seen in consultation today for lung cancer  Referring Physician(s): [place]  Supervising Physician: Markus Daft  Patient Status: [facility] - Out-pt  History of Present Illness: Sarah [name] is a 66 y.o. female with past medical history of anxiety, back pain, GERD, DVT/PE on coumadin who is also undergoing treatment for lung cancer.  Patient has undergone several weeks of chemotherapy through peripheral IVs.  She now has plans for immunotherapy, but has developed difficult/poor venous access.  IR consulted for Port-A-Cath placement at the request of Mikey Bussing, NP.  Patient presents today in her usual state of health.  She did recently have pneumonia and has completed her course of antibiotics.  She states she had an episode of shortness of breath this AM which resolved on its own.  She has been NPO.  She has appropriately held her coumadin since last Wednesday.   Past Medical History:  Diagnosis Date  . Arthritis   . Cancer ([place])    lung cancer  . Chronic anxiety    Patient describes as stress  . Chronic back pain   . DVT (deep venous thrombosis) ([place]) age 48  . GERD (gastroesophageal reflux disease)   . Goals of care, counseling/discussion 09/21/2016  . Headache(784.0)   . Hypoestrogenism   . Lower extremity venous stasis    LLE, chronic  . Obesity   . Odynophagia 11/13/2016  . Paroxysmal atrial fibrillation ([facility])   . PONV (postoperative nausea and vomiting)   . Pulmonary embolism ([place])   . Reflux   . Sleep apnea    wear CPAP  . Spinal stenosis   . Tobacco abuse   . Venous stasis   . Warfarin anticoagulation     Past Surgical History:  Procedure Laterality Date  . ANKLE SURGERY     x2  . APPENDECTOMY    . CARPAL TUNNEL RELEASE    . CHOLECYSTECTOMY    . OVARIAN CYST REMOVAL    . ROTATOR CUFF REPAIR    . TUBAL LIGATION    . VIDEO BRONCHOSCOPY WITH ENDOBRONCHIAL ULTRASOUND N/A 09/14/2016   Performed by Melrose Nakayama, MD at [facility] OR    Allergies: Aspirin  Medications: Prior to Admission medications   Medication Sig Start Date End Date Taking? Authorizing Provider  albuterol (PROVENTIL HFA;VENTOLIN HFA) 108 (90 Base) MCG/ACT inhaler Inhale 2 puffs into the lungs every 6 (six) hours as needed for wheezing or shortness of breath. 02/09/17  Yes [name], MD  budesonide-formoterol [facility]) 160-4.5 MCG/ACT inhaler Inhale 2 puffs into the lungs 2 (two) times daily. 02/13/17  Yes [name], MD  buPROPion (WELLBUTRIN SR) 150 MG 12 hr tablet TAKE 1 TABLET BY MOUTH TWICE DAILY **KEEP APPT ON 06/09/16** Patient taking differently: Take 150 mg by mouth daily. TAKE 1 TABLET BY MOUTH TWICE DAILY **KEEP APPT ON 06/09/16** 11/06/16  Yes [name], MD  calcium carbonate (TUMS - DOSED IN MG ELEMENTAL CALCIUM) 500 MG chewable tablet Chew 1 tablet by mouth 2 (two) times a week.    Yes [provider]  diltiazem (CARDIZEM CD) 180 MG 24 hr capsule TAKE ONE CAPSULE BY MOUTH EVERY DAY 10/12/16  Yes [name], MD  flecainide (TAMBOCOR) 50 MG tablet TAKE 1 TABLET (50 MG TOTAL) BY MOUTH 2 (TWO) TIMES DAILY. 02/08/17  Yes Satira Sark, MD  HYDROcodone-acetaminophen ([place]) 7.5-325 MG tablet One tablet up to four times a day as needed for pain Patient taking differently:  Take 1 tablet by mouth every 6 (six) hours as needed.  11/29/16  Yes Owens Shark, NP  pantoprazole (PROTONIX) 40 MG tablet TAKE 1 TABLET EVERY MORNING 10/12/16  Yes [name], MD  promethazine (PHENERGAN) 12.5 MG tablet Take 1 tablet (12.5 mg total) by mouth every 6 (six) hours as needed for nausea or vomiting. 10/30/16  Yes Curt Bears, MD  amoxicillin-clavulanate (AUGMENTIN) 875-125 MG tablet Take 1 tablet every 12 (twelve) hours by mouth. 03/05/17   Isaac Bliss, Rayford Halsted, MD  ketoconazole (NIZORAL) 2 % cream Apply BID for rash 02/09/17   [name], MD  lidocaine-prilocaine (EMLA) cream Apply  1 application as needed topically. 03/06/17   Maryanna Shape, NP  metoprolol tartrate (LOPRESSOR) 25 MG tablet TAKE 1 TABLET AS NEEDED FOR HEART RACING 01/08/17   [name], MD  prochlorperazine (COMPAZINE) 10 MG tablet Take 1 tablet (10 mg total) by mouth every 6 (six) hours as needed for nausea or vomiting. 12/26/16   Curt Bears, MD  sucralfate (CARAFATE) 1 g tablet Take 1 tablet (1 g total) by mouth 2 (two) times daily. 11/06/16   [name], MD  warfarin (COUMADIN) 5 MG tablet Take 0-2.5 mg daily by mouth. Take 2.5 mg Daily except none on Monday and Thursday.    [provider]     Family History  Problem Relation Age of Onset  . Arrhythmia Mother   . Cancer Mother        colon  . Heart disease Mother   . Stroke Father        Deceased    Social History   Socioeconomic History  . Marital status: Married    Spouse name: None  . Number of children: None  . Years of education: None  . Highest education level: None  Social Needs  . Financial resource strain: None  . Food insecurity - worry: None  . Food insecurity - inability: None  . Transportation needs - medical: None  . Transportation needs - non-medical: None  Occupational History  . Occupation: EMT    CommentArt gallery manager  . Occupation: Dialysis Tech    Comment: Retired  Tobacco Use  . Smoking status: Former Smoker    Packs/day: 1.00    Years: 40.00    Pack years: 40.00    Types: Cigarettes    Last attempt to quit: 08/29/2016    Years since quitting: 0.5  . Smokeless tobacco: Never Used  Substance and Sexual Activity  . Alcohol use: No    Alcohol/week: 0.0 oz  . Drug use: No  . Sexual activity: None  Other Topics Concern  . None  Social History Narrative  . None    Review of Systems  Constitutional: Negative for fatigue and fever.  Respiratory: Positive for shortness of breath. Negative for cough.   Gastrointestinal: Negative for abdominal pain.  Genitourinary: Negative for  frequency and urgency.  Musculoskeletal: Negative for back pain.  Psychiatric/Behavioral: Negative for behavioral problems and confusion.    Vital Signs: BP 135/80 (BP Location: Right Arm)   Pulse 89   Temp 97.9 F (36.6 C) (Oral)   Resp 16   SpO2 94%   Physical Exam  Constitutional: She is oriented to person, place, and time. She appears well-developed.  Cardiovascular: Normal rate, regular rhythm and normal heart sounds.  Pulmonary/Chest: Effort normal and breath sounds normal. No respiratory distress.  Abdominal: Soft.  Neurological: She is alert and oriented to person, place, and  time.  Skin: Skin is warm and dry.  Psychiatric: She has a normal mood and affect. Her behavior is normal. Judgment and thought content normal.  Nursing note and vitals reviewed.   Imaging: Dg Chest 2 View  Result Date: 03/02/2017 CLINICAL DATA:  Cough. Worsening shortness of breath. Lung cancer on active immunotherapy. EXAM: CHEST  2 VIEW COMPARISON:  Most recent radiograph 12/18/2016, most recent CT 12/25/2016 FINDINGS: Progressive volume loss in the left hemithorax. Retrocardiac soft tissue density at the left lung base, previous CT demonstrated a occlusion of the left lower lobe bronchus and left lower lobe collapse. Progressive ill-defined opacities throughout the mid and lower left hemithorax. Mild compensatory hyperinflation of the right lung. The right lung is clear. Limited assessment for pleural fluid. Leftward mediastinal shift secondary to atelectasis/collapse. No pulmonary edema. No pneumothorax. IMPRESSION: Progressive volume loss in left hemithorax, may be secondary to radiation change, however is nonspecific. No other new abnormality. Electronically Signed   By: Jeb Levering M.D.   On: 03/02/2017 23:56    Labs:  CBC: Recent Labs    02/21/17 1117 03/03/17 0236 03/13/17 1143 03/19/17 0748  WBC 7.6 5.1 6.5 10.3  HGB 13.3 12.7 13.4 13.8  HCT 40.6 39.2 41.9 41.7  PLT 195 194 210  224    COAGS: Recent Labs    09/14/16 0915  03/03/17 0533 03/04/17 0604 03/05/17 0529 03/08/17 1353 03/19/17 0748  INR 0.99   < > 1.23 1.31 1.60 2.1 0.90  APTT 30  --  27  --   --   --   --    < > = values in this interval not displayed.    BMP: Recent Labs    12/18/16 1808 12/19/16 0744 12/20/16 0449  02/13/17 0924 02/21/17 1117 03/03/17 0534 03/13/17 1144  NA 138 139 142   < > 143 142 142 141  K 3.4* 3.5 3.4*   < > 4.2 3.4* 3.7 4.0  CL 98* 101 101  --   --   --  103  --   CO2 30 29 31    < > 28 24 30 27   GLUCOSE 119* 111* 93   < > 141* 145* 120* 140  BUN 8 7 7    < > 7.5 7.4 8 6.5*  CALCIUM 9.1 8.8* 8.7*   < > 9.6 9.4 9.2 9.5  CREATININE 0.80 0.86 0.80   < > 0.9 0.8 0.79 1.0  GFRNONAA >60 >60 >60  --   --   --  >60  --   GFRAA >60 >60 >60  --   --   --  >60  --    < > = values in this interval not displayed.    LIVER FUNCTION TESTS: Recent Labs    01/16/17 0928 02/13/17 0924 02/21/17 1117 03/13/17 1144  BILITOT 0.28 0.34 0.31 0.30  AST 16 25 26 14   ALT 38 40 36 16  ALKPHOS 112 91 91 91  PROT 7.1 6.7 6.7 7.0  ALBUMIN 3.0* 3.4* 3.4* 3.3*    TUMOR MARKERS: No results for input(s): AFPTM, CEA, CA199, CHROMGRNA in the last 8760 hours.  Assessment and Plan: Patient with past medical history of lung cancer presents with plans for upcoming immunotherapy.  IR consulted for Port-A-Cath placement at the request of Mikey Bussing, NP. Patient presents today in their usual state of health. She did have an episode of shortness of breath this which resolved on its own.  She is afebrile and stable of her  home O2 settings.   Discussed with Dr. Anselm Pancoast; ok to proceed with Port-A-cath placement today.  She has been NPO and is not currently on blood thinners as she has held her coumadin appropriately.   Risks and benefits discussed with the patient including, but not limited to bleeding, infection, damage to adjacent structures or low yield requiring additional tests. All of  the patient's questions were answered, patient is agreeable to proceed. Consent signed and in chart.   Thank you for this interesting consult.  I greatly enjoyed meeting Sarah [name] and look forward to participating in their care.  A copy of this report was sent to the requesting provider on this date.  Electronically Signed: Docia Barrier, PA 03/19/2017, 9:03 AM   I spent a total of  30 Minutes   in face to face in clinical consultation, greater than 50% of which was counseling/coordinating care for lung cancer.

## 2017-03-20 ENCOUNTER — Other Ambulatory Visit: Payer: Medicare Other

## 2017-03-20 ENCOUNTER — Ambulatory Visit: Payer: Medicare Other | Admitting: Internal Medicine

## 2017-03-20 ENCOUNTER — Ambulatory Visit: Payer: Medicare Other

## 2017-03-21 ENCOUNTER — Encounter: Payer: Self-pay | Admitting: Family Medicine

## 2017-03-21 ENCOUNTER — Ambulatory Visit (INDEPENDENT_AMBULATORY_CARE_PROVIDER_SITE_OTHER): Payer: Medicare Other | Admitting: Family Medicine

## 2017-03-21 VITALS — BP 118/80 | Ht 67.0 in | Wt 239.0 lb

## 2017-03-21 DIAGNOSIS — J449 Chronic obstructive pulmonary disease, unspecified: Secondary | ICD-10-CM | POA: Diagnosis not present

## 2017-03-21 DIAGNOSIS — Z7901 Long term (current) use of anticoagulants: Secondary | ICD-10-CM | POA: Diagnosis not present

## 2017-03-21 LAB — POCT INR: INR: 1.1

## 2017-03-21 NOTE — Progress Notes (Signed)
   Subjective:    Patient ID: JESSCIA IMM, female    DOB: July 13, 1950, 66 y.o.   MRN: [id number]  Pneumonia  Chronicity: follow up.   Concerns about having tremors.. First noticed very faintly six mo ago some family history of this.  Worse when tired.  Progressively worse past 6 months.  Father tremors got worse in [place] as he got oldr     INR today 1.1. Stopped coumadin for 4 days. Started back on Monday took one half tablet Monday and Tuesday.   Sob most of the time still on the home O 2. tking reg .  Notes overall her oxygen controlling symptoms well.  Cough less productive.  Energy level still diminished but slowly improving.  Discussed.      Results for orders placed or performed in visit on 03/21/17  POCT INR  Result Value Ref Range   INR 1.1    Review of Systems No headache, no major weight loss or weight gain, no chest pain no back pain abdominal pain no change in bowel habits complete ROS otherwise negative     Objective:   Physical Exam Alert and oriented, vitals reviewed and stable, NAD ENT-TM's and ext canals WNL bilat via otoscopic exam Soft palate, tonsils and post pharynx WNL via oropharyngeal exam Neck-symmetric, no masses; thyroid nonpalpable and nontender Pulmonary-no tachypnea or accessory muscle use; Clear without wheezes via auscultation Card--no abnrml murmurs, rhythm reg and rate WNL Carotid pulses symmetric, without bruits Impression 1 status post pneumonia versus flare of COPD with background radiation pneumonitis  2.  COPD clinically improving discussed  3.  Fatigue and weakness within normal limits for current condition including lung cancer under treatment.  Discussed.  4.  Anticoagulation.  Coumadin adjusted.  See Coumadin she  Follow-up as scheduled  Greater than 50% of this 25 minute face to face visit was spent in counseling and discussion and coordination of care regarding the above diagnosis/diagnosies        Assessment  & Plan:

## 2017-03-27 ENCOUNTER — Ambulatory Visit: Payer: Medicare Other

## 2017-03-27 ENCOUNTER — Ambulatory Visit: Payer: Medicare Other | Admitting: Oncology

## 2017-03-27 ENCOUNTER — Other Ambulatory Visit: Payer: Medicare Other

## 2017-03-28 ENCOUNTER — Ambulatory Visit (HOSPITAL_BASED_OUTPATIENT_CLINIC_OR_DEPARTMENT_OTHER): Payer: Medicare Other

## 2017-03-28 ENCOUNTER — Ambulatory Visit: Payer: Medicare Other

## 2017-03-28 ENCOUNTER — Encounter: Payer: Self-pay | Admitting: Oncology

## 2017-03-28 ENCOUNTER — Other Ambulatory Visit (HOSPITAL_BASED_OUTPATIENT_CLINIC_OR_DEPARTMENT_OTHER): Payer: Medicare Other

## 2017-03-28 ENCOUNTER — Ambulatory Visit (HOSPITAL_BASED_OUTPATIENT_CLINIC_OR_DEPARTMENT_OTHER): Payer: Medicare Other | Admitting: Oncology

## 2017-03-28 VITALS — BP 111/53 | HR 73 | Temp 98.1°F | Resp 19 | Ht 67.0 in | Wt 240.5 lb

## 2017-03-28 DIAGNOSIS — C3432 Malignant neoplasm of lower lobe, left bronchus or lung: Secondary | ICD-10-CM | POA: Diagnosis not present

## 2017-03-28 DIAGNOSIS — R0609 Other forms of dyspnea: Secondary | ICD-10-CM | POA: Diagnosis not present

## 2017-03-28 DIAGNOSIS — C3492 Malignant neoplasm of unspecified part of left bronchus or lung: Secondary | ICD-10-CM

## 2017-03-28 DIAGNOSIS — Z5112 Encounter for antineoplastic immunotherapy: Secondary | ICD-10-CM

## 2017-03-28 DIAGNOSIS — R05 Cough: Secondary | ICD-10-CM | POA: Diagnosis not present

## 2017-03-28 DIAGNOSIS — Z95828 Presence of other vascular implants and grafts: Secondary | ICD-10-CM

## 2017-03-28 LAB — CBC WITH DIFFERENTIAL/PLATELET
BASO%: 0.3 % (ref 0.0–2.0)
BASOS ABS: 0 10*3/uL (ref 0.0–0.1)
EOS ABS: 0 10*3/uL (ref 0.0–0.5)
EOS%: 0.4 % (ref 0.0–7.0)
HEMATOCRIT: 39 % (ref 34.8–46.6)
HEMOGLOBIN: 12.9 g/dL (ref 11.6–15.9)
LYMPH#: 0.3 10*3/uL — AB (ref 0.9–3.3)
LYMPH%: 4.2 % — ABNORMAL LOW (ref 14.0–49.7)
MCH: 32.4 pg (ref 25.1–34.0)
MCHC: 33.2 g/dL (ref 31.5–36.0)
MCV: 97.6 fL (ref 79.5–101.0)
MONO#: 0.5 10*3/uL (ref 0.1–0.9)
MONO%: 7.8 % (ref 0.0–14.0)
NEUT#: 5.7 10*3/uL (ref 1.5–6.5)
NEUT%: 87.3 % — ABNORMAL HIGH (ref 38.4–76.8)
PLATELETS: 184 10*3/uL (ref 145–400)
RBC: 3.99 10*6/uL (ref 3.70–5.45)
RDW: 15.3 % — AB (ref 11.2–14.5)
WBC: 6.5 10*3/uL (ref 3.9–10.3)

## 2017-03-28 LAB — COMPREHENSIVE METABOLIC PANEL WITH GFR
ALT: 13 U/L (ref 0–55)
AST: 15 U/L (ref 5–34)
Albumin: 3.5 g/dL (ref 3.5–5.0)
Alkaline Phosphatase: 103 U/L (ref 40–150)
Anion Gap: 10 meq/L (ref 3–11)
BUN: 6.7 mg/dL — ABNORMAL LOW (ref 7.0–26.0)
CO2: 29 meq/L (ref 22–29)
Calcium: 9.7 mg/dL (ref 8.4–10.4)
Chloride: 102 meq/L (ref 98–109)
Creatinine: 0.9 mg/dL (ref 0.6–1.1)
EGFR: >60 ml/min/1.73 m2 (ref >60)
Glucose: 115 mg/dL (ref 70–140)
Potassium: 3.8 meq/L (ref 3.5–5.1)
Sodium: 141 meq/L (ref 136–145)
Total Bilirubin: 0.29 mg/dL (ref 0.20–1.20)
Total Protein: 7.1 g/dL (ref 6.4–8.3)

## 2017-03-28 LAB — RESEARCH LABS

## 2017-03-28 MED ORDER — SODIUM CHLORIDE 0.9% FLUSH
10.0000 mL | INTRAVENOUS | Status: DC | PRN
  Administered 2017-03-28: 10 mL via INTRAVENOUS
  Filled 2017-03-28: qty 10

## 2017-03-28 MED ORDER — SODIUM CHLORIDE 0.9 % IV SOLN
Freq: Once | INTRAVENOUS | Status: AC
  Administered 2017-03-28: 14:00:00 via INTRAVENOUS

## 2017-03-28 MED ORDER — SODIUM CHLORIDE 0.9 % IV SOLN
10.2000 mg/kg | Freq: Once | INTRAVENOUS | Status: AC
  Administered 2017-03-28: 1120 mg via INTRAVENOUS
  Filled 2017-03-28: qty 2.4

## 2017-03-28 MED ORDER — SODIUM CHLORIDE 0.9% FLUSH
10.0000 mL | INTRAVENOUS | Status: DC | PRN
  Administered 2017-03-28: 10 mL
  Filled 2017-03-28: qty 10

## 2017-03-28 MED ORDER — HEPARIN SOD (PORK) LOCK FLUSH 100 UNIT/ML IV SOLN
500.0000 [IU] | Freq: Once | INTRAVENOUS | Status: AC | PRN
  Administered 2017-03-28: 500 [IU]
  Filled 2017-03-28: qty 5

## 2017-03-28 NOTE — Progress Notes (Signed)
[facility] OFFICE PROGRESS NOTE  [name], MD [place] [address]  DIAGNOSIS: Unresectable stage IIIA(T4, N0, M0) non-small cell lung cancer, squamous cell carcinoma diagnosed in May 2018 and presented with large obstructing left lower lobe lung mass suspicious small mediastinal lymphadenopathy.  PRIOR THERAPY:  A course of concurrent chemoradiation with weekly carboplatin for AUC of 2 and paclitaxel 45 MG/M2. Status post 5 cycles. She received her radiation in [facility]. Last dose of chemotherapy was given 11/27/2016.Marland Kitchen  CURRENT THERAPY: consolidation immunotherapy with Imfinzi (Durvalumab) 10 MG/KG every 2 weeks, first dose 02/21/2017.  Status post 2 cycles.  INTERVAL HISTORY: Sarah [name] 66 y.o. female returns for routine follow-up visit accompanied by her husband.  The patient reports that she is feeling well and has no specific complaints except for ongoing shortness of breath.  Patient denies fevers and chills.  Denies chest pain, shortness of breath at rest, and hemoptysis.  She still has an ongoing cough and dyspnea on exertion.  Denies nausea, vomiting, constipation, diarrhea.  Since her last visit, the patient had a Port-A-Cath placed.  The patient is here for evaluation prior to cycle #3 of her Imfinzi today.  MEDICAL HISTORY: Past Medical History:  Diagnosis Date  . Arthritis   . Cancer ([place])    lung cancer  . Chronic anxiety    Patient describes as stress  . Chronic back pain   . DVT (deep venous thrombosis) ([place]) age 75  . GERD (gastroesophageal reflux disease)   . Goals of care, counseling/discussion 09/21/2016  . Headache(784.0)   . Hypoestrogenism   . Lower extremity venous stasis    LLE, chronic  . Obesity   . Odynophagia 11/13/2016  . Paroxysmal atrial fibrillation (HCC)   . PONV (postoperative nausea and vomiting)   . Pulmonary embolism ([place])   . Reflux   . Sleep apnea    wear CPAP  . Spinal  stenosis   . Tobacco abuse   . Venous stasis   . Warfarin anticoagulation     ALLERGIES:  is allergic to aspirin.  MEDICATIONS:  Current Outpatient Medications  Medication Sig Dispense Refill  . albuterol (PROVENTIL HFA;VENTOLIN HFA) 108 (90 Base) MCG/ACT inhaler Inhale 2 puffs into the lungs every 6 (six) hours as needed for wheezing or shortness of breath. 1 Inhaler 5  . budesonide-formoterol (SYMBICORT) 160-4.5 MCG/ACT inhaler Inhale 2 puffs into the lungs 2 (two) times daily. 1 Inhaler 12  . buPROPion (WELLBUTRIN SR) 150 MG 12 hr tablet TAKE 1 TABLET BY MOUTH TWICE DAILY **KEEP APPT ON 06/09/16** (Patient taking differently: Take 150 mg by mouth daily. TAKE 1 TABLET BY MOUTH TWICE DAILY **KEEP APPT ON 06/09/16**) 180 tablet 0  . calcium carbonate (TUMS - DOSED IN MG ELEMENTAL CALCIUM) 500 MG chewable tablet Chew 1 tablet by mouth 2 (two) times a week.     . diltiazem (CARDIZEM CD) 180 MG 24 hr capsule TAKE ONE CAPSULE BY MOUTH EVERY DAY 90 capsule 1  . flecainide (TAMBOCOR) 50 MG tablet TAKE 1 TABLET (50 MG TOTAL) BY MOUTH 2 (TWO) TIMES DAILY. 60 tablet 2  . HYDROcodone-acetaminophen (NORCO) 7.5-325 MG tablet One tablet up to four times a day as needed for pain (Patient taking differently: Take 1 tablet by mouth every 6 (six) hours as needed. ) 60 tablet 0  . ketoconazole (NIZORAL) 2 % cream Apply BID for rash 30 g 2  . lidocaine-prilocaine (EMLA) cream Apply 1 application as  needed topically. 30 g 2  . metoprolol tartrate (LOPRESSOR) 25 MG tablet TAKE 1 TABLET AS NEEDED FOR HEART RACING 10 tablet 5  . pantoprazole (PROTONIX) 40 MG tablet TAKE 1 TABLET EVERY MORNING 90 tablet 1  . prochlorperazine (COMPAZINE) 10 MG tablet Take 1 tablet (10 mg total) by mouth every 6 (six) hours as needed for nausea or vomiting. 30 tablet 0  . promethazine (PHENERGAN) 12.5 MG tablet Take 1 tablet (12.5 mg total) by mouth every 6 (six) hours as needed for nausea or vomiting. 30 tablet 0  . sucralfate  (CARAFATE) 1 g tablet Take 1 tablet (1 g total) by mouth 2 (two) times daily. 60 tablet 0  . warfarin (COUMADIN) 5 MG tablet Take 0-2.5 mg daily by mouth. Take 2.5 mg Daily except none on Monday and Thursday.     No current facility-administered medications for this visit.    Facility-Administered Medications Ordered in Other Visits  Medication Dose Route Frequency Provider Last Rate Last Dose  . sodium chloride flush (NS) 0.9 % injection 10 mL  10 mL Intracatheter PRN Curt Bears, MD   10 mL at 03/28/17 1623    SURGICAL HISTORY:  Past Surgical History:  Procedure Laterality Date  . ANKLE SURGERY     x2  . APPENDECTOMY    . CARPAL TUNNEL RELEASE    . CHOLECYSTECTOMY    . IR FLUORO GUIDE PORT INSERTION RIGHT  03/19/2017  . IR US GUIDE VASC ACCESS RIGHT  03/19/2017  . OVARIAN CYST REMOVAL    . ROTATOR CUFF REPAIR    . TUBAL LIGATION    . VIDEO BRONCHOSCOPY WITH ENDOBRONCHIAL ULTRASOUND N/A 09/14/2016   Procedure: VIDEO BRONCHOSCOPY WITH ENDOBRONCHIAL ULTRASOUND;  Surgeon: Melrose Nakayama, MD;  Location: [facility] OR;  Service: Thoracic;  Laterality: N/A;    REVIEW OF SYSTEMS:   Review of Systems  Constitutional: Negative for appetite change, chills, fatigue, fever and unexpected weight change.  HENT:   Negative for mouth sores, nosebleeds, sore throat and trouble swallowing.   Eyes: Negative for eye problems and icterus.  Respiratory: Negative for hemoptysis, shortness of breath at rest and wheezing.  Positive for cough and shortness of breath with exertion. Cardiovascular: Negative for chest pain and leg swelling.  Gastrointestinal: Negative for abdominal pain, constipation, diarrhea, nausea and vomiting.  Genitourinary: Negative for bladder incontinence, difficulty urinating, dysuria, frequency and hematuria.   Musculoskeletal: Negative for back pain, gait problem, neck pain and neck stiffness.  Skin: Negative for itching and rash.  Neurological: Negative for dizziness,  extremity weakness, gait problem, headaches, light-headedness and seizures.  Hematological: Negative for adenopathy. Does not bruise/bleed easily.  Psychiatric/Behavioral: Negative for confusion, depression and sleep disturbance. The patient is not nervous/anxious.     PHYSICAL EXAMINATION:  Blood pressure (!) 111/53, pulse 73, temperature 98.1 F (36.7 C), temperature source Oral, resp. rate 19, height 5\' 7"  (1.702 m), weight 240 lb 8 oz (109.1 kg), SpO2 98 %.  ECOG PERFORMANCE STATUS: 1 - Symptomatic but completely ambulatory  Physical Exam  Constitutional: Oriented to person, place, and time and well-developed, well-nourished, and in no distress. No distress.  HENT:  Head: Normocephalic and atraumatic.  Mouth/Throat: Oropharynx is clear and moist. No oropharyngeal exudate.  Eyes: Conjunctivae are normal. Right eye exhibits no discharge. Left eye exhibits no discharge. No scleral icterus.  Neck: Normal range of motion. Neck supple.  Cardiovascular: Normal rate, regular rhythm, normal heart sounds and intact distal pulses.   Pulmonary/Chest: Effort normal and breath sounds  normal. No respiratory distress. No wheezes. No rales.  Abdominal: Soft. Bowel sounds are normal. Exhibits no distension and no mass. There is no tenderness.  Musculoskeletal: Normal range of motion. Exhibits no edema.  Lymphadenopathy:    No cervical adenopathy.  Neurological: Alert and oriented to person, place, and time. Exhibits normal muscle tone. Gait normal. Coordination normal.  Skin: Skin is warm and dry. No rash noted. Not diaphoretic. No erythema. No pallor.  Psychiatric: Mood, memory and judgment normal.  Vitals reviewed.  LABORATORY DATA: Lab Results  Component Value Date   WBC 6.5 03/28/2017   HGB 12.9 03/28/2017   HCT 39.0 03/28/2017   MCV 97.6 03/28/2017   PLT 184 03/28/2017      Chemistry      Component Value Date/Time   NA 141 03/28/2017 1221   K 3.8 03/28/2017 1221   CL 103 03/03/2017  0534   CO2 29 03/28/2017 1221   BUN 6.7 (L) 03/28/2017 1221   CREATININE 0.9 03/28/2017 1221      Component Value Date/Time   CALCIUM 9.7 03/28/2017 1221   ALKPHOS 103 03/28/2017 1221   AST 15 03/28/2017 1221   ALT 13 03/28/2017 1221   BILITOT 0.29 03/28/2017 1221       RADIOGRAPHIC STUDIES:  Dg Chest 2 View  Result Date: 03/02/2017 CLINICAL DATA:  Cough. Worsening shortness of breath. Lung cancer on active immunotherapy. EXAM: CHEST  2 VIEW COMPARISON:  Most recent radiograph 12/18/2016, most recent CT 12/25/2016 FINDINGS: Progressive volume loss in the left hemithorax. Retrocardiac soft tissue density at the left lung base, previous CT demonstrated a occlusion of the left lower lobe bronchus and left lower lobe collapse. Progressive ill-defined opacities throughout the mid and lower left hemithorax. Mild compensatory hyperinflation of the right lung. The right lung is clear. Limited assessment for pleural fluid. Leftward mediastinal shift secondary to atelectasis/collapse. No pulmonary edema. No pneumothorax. IMPRESSION: Progressive volume loss in left hemithorax, may be secondary to radiation change, however is nonspecific. No other new abnormality. Electronically Signed   By: Jeb Levering M.D.   On: 03/02/2017 23:56   Ir US Guide Vasc Access Right  Result Date: 03/19/2017 INDICATION: 66 year old with squamous cell carcinoma of the left lung. Port-A-Cath needed for treatment. EXAM: FLUOROSCOPIC AND ULTRASOUND GUIDED PLACEMENT OF A SUBCUTANEOUS PORT COMPARISON:  None. MEDICATIONS: Ancef 2 g; The antibiotic was administered within an appropriate time interval prior to skin puncture. ANESTHESIA/SEDATION: Versed 2.0 mg IV; Fentanyl 100 mcg IV; Moderate Sedation Time:  35 minutes The patient was continuously monitored during the procedure by the interventional radiology nurse under my direct supervision. FLUOROSCOPY TIME:  1 minute, 6 seconds (13 mGy) COMPLICATIONS: None immediate.  PROCEDURE: The procedure, risks, benefits, and alternatives were explained to the patient. Questions regarding the procedure were encouraged and answered. The patient understands and consents to the procedure. Patient was placed supine on the interventional table. Ultrasound confirmed a patent right internal jugular vein. The right chest and neck were cleaned with a skin antiseptic and a sterile drape was placed. Maximal barrier sterile technique was utilized including caps, mask, sterile gowns, sterile gloves, sterile drape, hand hygiene and skin antiseptic. The right neck was anesthetized with 1% lidocaine. Small incision was made in the right neck with a blade. Micropuncture set was placed in the right internal jugular vein with ultrasound guidance. The micropuncture wire was used for measurement purposes. The right chest was anesthetized with 1% lidocaine with epinephrine. #15 blade was used to make an incision  and a subcutaneous port pocket was formed. [place] was assembled. Subcutaneous tunnel was formed with a stiff tunneling device. The port catheter was brought through the subcutaneous tunnel. The port was placed in the subcutaneous pocket and sutured in place. The micropuncture set was exchanged for a peel-away sheath. The catheter was placed through the peel-away sheath and the tip was positioned at the superior cavoatrial junction. Catheter placement was confirmed with fluoroscopy. The port was accessed and flushed with heparinized saline. The port pocket was closed using two layers of absorbable sutures and Dermabond. The vein skin site was closed using a single layer of absorbable suture and Dermabond. Sterile dressings were applied. Patient tolerated the procedure well without an immediate complication. Ultrasound and fluoroscopic images were taken and saved for this procedure. IMPRESSION: Placement of a subcutaneous port device. Electronically Signed   By: Markus Daft M.D.   On: 03/19/2017  13:48   Ir Fluoro Guide Port Insertion Right  Result Date: 03/19/2017 INDICATION: 66 year old with squamous cell carcinoma of the left lung. Port-A-Cath needed for treatment. EXAM: FLUOROSCOPIC AND ULTRASOUND GUIDED PLACEMENT OF A SUBCUTANEOUS PORT COMPARISON:  None. MEDICATIONS: Ancef 2 g; The antibiotic was administered within an appropriate time interval prior to skin puncture. ANESTHESIA/SEDATION: Versed 2.0 mg IV; Fentanyl 100 mcg IV; Moderate Sedation Time:  35 minutes The patient was continuously monitored during the procedure by the interventional radiology nurse under my direct supervision. FLUOROSCOPY TIME:  1 minute, 6 seconds (13 mGy) COMPLICATIONS: None immediate. PROCEDURE: The procedure, risks, benefits, and alternatives were explained to the patient. Questions regarding the procedure were encouraged and answered. The patient understands and consents to the procedure. Patient was placed supine on the interventional table. Ultrasound confirmed a patent right internal jugular vein. The right chest and neck were cleaned with a skin antiseptic and a sterile drape was placed. Maximal barrier sterile technique was utilized including caps, mask, sterile gowns, sterile gloves, sterile drape, hand hygiene and skin antiseptic. The right neck was anesthetized with 1% lidocaine. Small incision was made in the right neck with a blade. Micropuncture set was placed in the right internal jugular vein with ultrasound guidance. The micropuncture wire was used for measurement purposes. The right chest was anesthetized with 1% lidocaine with epinephrine. #15 blade was used to make an incision and a subcutaneous port pocket was formed. [place] was assembled. Subcutaneous tunnel was formed with a stiff tunneling device. The port catheter was brought through the subcutaneous tunnel. The port was placed in the subcutaneous pocket and sutured in place. The micropuncture set was exchanged for a peel-away  sheath. The catheter was placed through the peel-away sheath and the tip was positioned at the superior cavoatrial junction. Catheter placement was confirmed with fluoroscopy. The port was accessed and flushed with heparinized saline. The port pocket was closed using two layers of absorbable sutures and Dermabond. The vein skin site was closed using a single layer of absorbable suture and Dermabond. Sterile dressings were applied. Patient tolerated the procedure well without an immediate complication. Ultrasound and fluoroscopic images were taken and saved for this procedure. IMPRESSION: Placement of a subcutaneous port device. Electronically Signed   By: Markus Daft M.D.   On: 03/19/2017 13:48     ASSESSMENT/PLAN:  Squamous cell carcinoma of lung, left (HCC) This is a very pleasant 66 year old with unresectable a stage IIIa non-small cell lung cancer. The patient underwent a course of concurrent chemoradiation with weekly carboplatin and paclitaxel  status post 8 cycles and has been tolerating this treatment fairly well except for mild fatigue and odynophagia. The patient is now on Imfinzi status post 2 cycles.  The patient continues to tolerate her Imfinzi well.  Recommend that she proceed with cycle 3 as scheduled today.  Patient notices being more short of breath at times.  We discussed that she is afebrile and that her lungs are clear.  Her oxygen saturation is 100%.  We discussed that her shortness of breath may be related to deconditioning.  I have encouraged her to begin some light exercises.  The patient will be seen back for follow-up in 2 weeks for evaluation prior to cycle #4 of Infinzi.  She was advised to call immediately if she has any concerning symptoms in the interval.  The patient voices understanding of current disease status and treatment options and is in agreement with the current care plan. All questions were answered. The patient knows to call the clinic with any  problems, questions or concerns. We can certainly see the patient much sooner if necessary.  No orders of the defined types were placed in this encounter.  Mikey Bussing, DNP, AGPCNP-BC, AOCNP 03/28/17

## 2017-03-28 NOTE — Assessment & Plan Note (Signed)
This is a very pleasant 66 year old with unresectable a stage IIIa non-small cell lung cancer. The patient underwent a course of concurrent chemoradiation with weekly carboplatin and paclitaxel status post 8 cycles and has been tolerating this treatment fairly well except for mild fatigue and odynophagia. The patient is now on Imfinzi status post 2 cycles.  The patient continues to tolerate her Imfinzi well.  Recommend that she proceed with cycle 3 as scheduled today.  Patient notices being more short of breath at times.  We discussed that she is afebrile and that her lungs are clear.  Her oxygen saturation is 100%.  We discussed that her shortness of breath may be related to deconditioning.  I have encouraged her to begin some light exercises.  The patient will be seen back for follow-up in 2 weeks for evaluation prior to cycle #4 of Infinzi.  She was advised to call immediately if she has any concerning symptoms in the interval.  The patient voices understanding of current disease status and treatment options and is in agreement with the current care plan. All questions were answered. The patient knows to call the clinic with any problems, questions or concerns. We can certainly see the patient much sooner if necessary.

## 2017-03-28 NOTE — Patient Instructions (Signed)
[facility] Discharge Instructions for Patients Receiving Chemotherapy  Today you received the following chemotherapy agents: Imfinzi.  To help prevent nausea and vomiting after your treatment, we encourage you to take your nausea medication as directed.   If you develop nausea and vomiting that is not controlled by your nausea medication, call the clinic.   BELOW ARE SYMPTOMS THAT SHOULD BE REPORTED IMMEDIATELY:  *FEVER GREATER THAN 100.5 F  *CHILLS WITH OR WITHOUT FEVER  NAUSEA AND VOMITING THAT IS NOT CONTROLLED WITH YOUR NAUSEA MEDICATION  *UNUSUAL SHORTNESS OF BREATH  *UNUSUAL BRUISING OR BLEEDING  TENDERNESS IN MOUTH AND THROAT WITH OR WITHOUT PRESENCE OF ULCERS  *URINARY PROBLEMS  *BOWEL PROBLEMS  UNUSUAL RASH Items with * indicate a potential emergency and should be followed up as soon as possible.  Feel free to call the clinic should you have any questions or concerns. The clinic phone number is [phone].  Please show the CHEMO ALERT CARD at check-in to the Emergency Department and triage nurse.   

## 2017-03-29 ENCOUNTER — Telehealth: Payer: Self-pay | Admitting: Internal Medicine

## 2017-03-29 ENCOUNTER — Other Ambulatory Visit: Payer: Self-pay | Admitting: Family Medicine

## 2017-03-29 NOTE — Telephone Encounter (Signed)
Scheduled appt per 11/28 los - Patient to pick up new schedule next visit.

## 2017-04-04 ENCOUNTER — Ambulatory Visit: Payer: Medicare Other | Admitting: Oncology

## 2017-04-04 ENCOUNTER — Ambulatory Visit: Payer: Medicare Other

## 2017-04-04 ENCOUNTER — Other Ambulatory Visit: Payer: Medicare Other

## 2017-04-06 DIAGNOSIS — C3432 Malignant neoplasm of lower lobe, left bronchus or lung: Secondary | ICD-10-CM | POA: Diagnosis not present

## 2017-04-11 ENCOUNTER — Telehealth: Payer: Self-pay | Admitting: *Deleted

## 2017-04-11 ENCOUNTER — Other Ambulatory Visit (HOSPITAL_BASED_OUTPATIENT_CLINIC_OR_DEPARTMENT_OTHER): Payer: Medicare Other

## 2017-04-11 ENCOUNTER — Encounter: Payer: Self-pay | Admitting: Oncology

## 2017-04-11 ENCOUNTER — Ambulatory Visit: Payer: Medicare Other

## 2017-04-11 ENCOUNTER — Ambulatory Visit (HOSPITAL_BASED_OUTPATIENT_CLINIC_OR_DEPARTMENT_OTHER): Payer: Medicare Other | Admitting: Oncology

## 2017-04-11 ENCOUNTER — Ambulatory Visit (HOSPITAL_BASED_OUTPATIENT_CLINIC_OR_DEPARTMENT_OTHER): Payer: Medicare Other

## 2017-04-11 VITALS — BP 105/45 | HR 84 | Temp 98.4°F | Resp 20 | Wt 243.8 lb

## 2017-04-11 DIAGNOSIS — C3432 Malignant neoplasm of lower lobe, left bronchus or lung: Secondary | ICD-10-CM | POA: Diagnosis not present

## 2017-04-11 DIAGNOSIS — Z5112 Encounter for antineoplastic immunotherapy: Secondary | ICD-10-CM

## 2017-04-11 DIAGNOSIS — Z79899 Other long term (current) drug therapy: Secondary | ICD-10-CM

## 2017-04-11 DIAGNOSIS — C3492 Malignant neoplasm of unspecified part of left bronchus or lung: Secondary | ICD-10-CM

## 2017-04-11 DIAGNOSIS — K5900 Constipation, unspecified: Secondary | ICD-10-CM | POA: Diagnosis not present

## 2017-04-11 DIAGNOSIS — R109 Unspecified abdominal pain: Secondary | ICD-10-CM | POA: Insufficient documentation

## 2017-04-11 DIAGNOSIS — R0609 Other forms of dyspnea: Secondary | ICD-10-CM | POA: Diagnosis not present

## 2017-04-11 DIAGNOSIS — Z95828 Presence of other vascular implants and grafts: Secondary | ICD-10-CM

## 2017-04-11 DIAGNOSIS — R5382 Chronic fatigue, unspecified: Secondary | ICD-10-CM

## 2017-04-11 LAB — COMPREHENSIVE METABOLIC PANEL
ALBUMIN: 3.6 g/dL (ref 3.5–5.0)
ALK PHOS: 95 U/L (ref 40–150)
ALT: 17 U/L (ref 0–55)
AST: 18 U/L (ref 5–34)
Anion Gap: 11 mEq/L (ref 3–11)
BUN: 5.6 mg/dL — AB (ref 7.0–26.0)
CALCIUM: 9.3 mg/dL (ref 8.4–10.4)
CO2: 26 mEq/L (ref 22–29)
CREATININE: 0.9 mg/dL (ref 0.6–1.1)
Chloride: 102 mEq/L (ref 98–109)
EGFR: >60 mL/min/{1.73_m2} (ref >60)
GLUCOSE: 94 mg/dL (ref 70–140)
POTASSIUM: 3.7 meq/L (ref 3.5–5.1)
SODIUM: 140 meq/L (ref 136–145)
Total Bilirubin: 0.35 mg/dL (ref 0.20–1.20)
Total Protein: 6.8 g/dL (ref 6.4–8.3)

## 2017-04-11 LAB — CBC WITH DIFFERENTIAL/PLATELET
BASO%: 0.6 % (ref 0.0–2.0)
Basophils Absolute: 0 10*3/uL (ref 0.0–0.1)
EOS%: 0.9 % (ref 0.0–7.0)
Eosinophils Absolute: 0.1 10*3/uL (ref 0.0–0.5)
HEMATOCRIT: 40.3 % (ref 34.8–46.6)
HEMOGLOBIN: 13 g/dL (ref 11.6–15.9)
LYMPH#: 0.3 10*3/uL — AB (ref 0.9–3.3)
LYMPH%: 4.6 % — ABNORMAL LOW (ref 14.0–49.7)
MCH: 31.6 pg (ref 25.1–34.0)
MCHC: 32.4 g/dL (ref 31.5–36.0)
MCV: 97.5 fL (ref 79.5–101.0)
MONO#: 0.5 10*3/uL (ref 0.1–0.9)
MONO%: 8.4 % (ref 0.0–14.0)
NEUT%: 85.5 % — ABNORMAL HIGH (ref 38.4–76.8)
NEUTROS ABS: 5.4 10*3/uL (ref 1.5–6.5)
Platelets: 196 10*3/uL (ref 145–400)
RBC: 4.13 10*6/uL (ref 3.70–5.45)
RDW: 14.6 % — AB (ref 11.2–14.5)
WBC: 6.3 10*3/uL (ref 3.9–10.3)

## 2017-04-11 LAB — TSH: TSH: 1.838 m(IU)/L (ref 0.308–3.960)

## 2017-04-11 MED ORDER — HEPARIN SOD (PORK) LOCK FLUSH 100 UNIT/ML IV SOLN
500.0000 [IU] | Freq: Once | INTRAVENOUS | Status: AC | PRN
  Administered 2017-04-11: 500 [IU]
  Filled 2017-04-11: qty 5

## 2017-04-11 MED ORDER — SODIUM CHLORIDE 0.9 % IV SOLN
1120.0000 mg | Freq: Once | INTRAVENOUS | Status: AC
  Administered 2017-04-11: 1120 mg via INTRAVENOUS
  Filled 2017-04-11: qty 2.4

## 2017-04-11 MED ORDER — SODIUM CHLORIDE 0.9% FLUSH
10.0000 mL | INTRAVENOUS | Status: DC | PRN
  Administered 2017-04-11: 10 mL via INTRAVENOUS
  Filled 2017-04-11: qty 10

## 2017-04-11 MED ORDER — SODIUM CHLORIDE 0.9 % IV SOLN
Freq: Once | INTRAVENOUS | Status: AC
  Administered 2017-04-11: 12:00:00 via INTRAVENOUS

## 2017-04-11 MED ORDER — SODIUM CHLORIDE 0.9% FLUSH
10.0000 mL | INTRAVENOUS | Status: DC | PRN
  Administered 2017-04-11: 10 mL
  Filled 2017-04-11: qty 10

## 2017-04-11 NOTE — Patient Instructions (Signed)
[facility] Discharge Instructions for Patients Receiving Chemotherapy  Today you received the following chemotherapy agents: Imfinzi.  To help prevent nausea and vomiting after your treatment, we encourage you to take your nausea medication as directed.   If you develop nausea and vomiting that is not controlled by your nausea medication, call the clinic.   BELOW ARE SYMPTOMS THAT SHOULD BE REPORTED IMMEDIATELY:  *FEVER GREATER THAN 100.5 F  *CHILLS WITH OR WITHOUT FEVER  NAUSEA AND VOMITING THAT IS NOT CONTROLLED WITH YOUR NAUSEA MEDICATION  *UNUSUAL SHORTNESS OF BREATH  *UNUSUAL BRUISING OR BLEEDING  TENDERNESS IN MOUTH AND THROAT WITH OR WITHOUT PRESENCE OF ULCERS  *URINARY PROBLEMS  *BOWEL PROBLEMS  UNUSUAL RASH Items with * indicate a potential emergency and should be followed up as soon as possible.  Feel free to call the clinic should you have any questions or concerns. The clinic phone number is [phone].  Please show the CHEMO ALERT CARD at check-in to the Emergency Department and triage nurse.   

## 2017-04-11 NOTE — Assessment & Plan Note (Signed)
This is a very pleasant 66 year old with unresectable a stage IIIa non-small cell lung cancer. The patient underwent a course of concurrent chemoradiation with weekly carboplatin and paclitaxel status post 8 cycles and has been tolerating this treatment fairly well except for mild fatigue and odynophagia. The patient is now on Imfinzi status post 3 cycles.  The patient continues to tolerate her Imfinzi well.  Recommend that she proceed with cycle 4 as scheduled today.  The patient reports left flank pain which is been present for 3-4 days.  Pain is controlled with hydrocodone.  She is not have any urinary symptoms, but will check a urinalysis and urine culture today.  Pain could also be related to constipation and we discussed starting Senokot-S 2 tablets twice a day for this.  She was advised to call us if her pain worsens.  The patient will be seen back for follow-up in 2 weeks for evaluation prior to cycle #5 of Infinzi.  She was advised to call immediately if she has any concerning symptoms in the interval.  The patient voices understanding of current disease status and treatment options and is in agreement with the current care plan. All questions were answered. The patient knows to call the clinic with any problems, questions or concerns. We can certainly see the patient much sooner if necessary.

## 2017-04-11 NOTE — Progress Notes (Signed)
[facility] OFFICE PROGRESS NOTE  [name], MD [place] [address]  DIAGNOSIS: Unresectable stage IIIA(T4, N0, M0) non-small cell lung cancer, squamous cell carcinoma diagnosed in May 2018 and presented with large obstructing left lower lobe lung mass suspicious small mediastinal lymphadenopathy.  PRIOR THERAPY: A course of concurrent chemoradiation with weekly carboplatin for AUC of 2 and paclitaxel 45 MG/M2. Status post 5 cycles. She received her radiation in [facility]. Last dose of chemotherapy was given 11/27/2016.  CURRENT THERAPY: consolidation immunotherapy with Imfinzi (Durvalumab) 10 MG/KG every 2 weeks, first dose 02/21/2017.Status post 3 cycles.  INTERVAL HISTORY: Sarah [name] 66 y.o. female returns for routine follow-up visit accompanied by her husband.  The patient reports that she is feeling well except for left-sided flank pain which is been present for 3-4 days.  The patient denies dysuria, urinary frequency, and hematuria.  She does report constipation and has not had a bowel movement in approximately 4 days.  She has not yet taken anything for her constipation and plans to do so this evening.  She uses hydrocodone as needed for pain.  She denies fevers and chills.  Denies chest pain, cough, hemoptysis.  She has her ongoing shortness of breath exertion.  Denies nausea, vomiting, diarrhea.  The patient is here for evaluation prior to cycle #4 of her Imfinzi.  MEDICAL HISTORY: Past Medical History:  Diagnosis Date  . Arthritis   . Cancer ([place])    lung cancer  . Chronic anxiety    Patient describes as stress  . Chronic back pain   . DVT (deep venous thrombosis) ([facility]) age 53  . GERD (gastroesophageal reflux disease)   . Goals of care, counseling/discussion 09/21/2016  . Headache(784.0)   . Hypoestrogenism   . Lower extremity venous stasis    LLE, chronic  . Obesity   . Odynophagia 11/13/2016  . Paroxysmal  atrial fibrillation ([facility])   . PONV (postoperative nausea and vomiting)   . Pulmonary embolism ([facility])   . Reflux   . Sleep apnea    wear CPAP  . Spinal stenosis   . Tobacco abuse   . Venous stasis   . Warfarin anticoagulation     ALLERGIES:  is allergic to aspirin.  MEDICATIONS:  Current Outpatient Medications  Medication Sig Dispense Refill  . albuterol (PROVENTIL HFA;VENTOLIN HFA) 108 (90 Base) MCG/ACT inhaler Inhale 2 puffs into the lungs every 6 (six) hours as needed for wheezing or shortness of breath. 1 Inhaler 5  . budesonide-formoterol (SYMBICORT) 160-4.5 MCG/ACT inhaler Inhale 2 puffs into the lungs 2 (two) times daily. 1 Inhaler 12  . buPROPion (WELLBUTRIN SR) 150 MG 12 hr tablet TAKE 1 TABLET BY MOUTH TWICE DAILY **KEEP APPT ON 06/09/16** (Patient taking differently: Take 150 mg by mouth daily. TAKE 1 TABLET BY MOUTH TWICE DAILY **KEEP APPT ON 06/09/16**) 180 tablet 0  . calcium carbonate (TUMS - DOSED IN MG ELEMENTAL CALCIUM) 500 MG chewable tablet Chew 1 tablet by mouth 2 (two) times a week.     . diltiazem (CARDIZEM CD) 180 MG 24 hr capsule TAKE ONE CAPSULE BY MOUTH EVERY DAY 90 capsule 1  . flecainide (TAMBOCOR) 50 MG tablet TAKE 1 TABLET (50 MG TOTAL) BY MOUTH 2 (TWO) TIMES DAILY. 60 tablet 2  . HYDROcodone-acetaminophen (NORCO) 7.5-325 MG tablet One tablet up to four times a day as needed for pain (Patient taking differently: Take 1 tablet by mouth every 6 (six) hours as  needed. ) 60 tablet 0  . ketoconazole (NIZORAL) 2 % cream Apply BID for rash 30 g 2  . lidocaine-prilocaine (EMLA) cream Apply 1 application as needed topically. 30 g 2  . metoprolol tartrate (LOPRESSOR) 25 MG tablet TAKE 1 TABLET AS NEEDED FOR HEART RACING 10 tablet 5  . pantoprazole (PROTONIX) 40 MG tablet TAKE 1 TABLET EVERY MORNING 90 tablet 1  . prochlorperazine (COMPAZINE) 10 MG tablet Take 1 tablet (10 mg total) by mouth every 6 (six) hours as needed for nausea or vomiting. 30 tablet 0  .  promethazine (PHENERGAN) 12.5 MG tablet Take 1 tablet (12.5 mg total) by mouth every 6 (six) hours as needed for nausea or vomiting. 30 tablet 0  . promethazine (PHENERGAN) 25 MG tablet TAKE 1 TABLET BY MOUTH EVERY 6 HOURS AS NEEDED FOR NAUSEA/VOMITING 30 tablet 0  . sucralfate (CARAFATE) 1 g tablet Take 1 tablet (1 g total) by mouth 2 (two) times daily. 60 tablet 0  . warfarin (COUMADIN) 5 MG tablet Take 0-2.5 mg daily by mouth. Take 2.5 mg Daily except none on Monday and Thursday.     No current facility-administered medications for this visit.    Facility-Administered Medications Ordered in Other Visits  Medication Dose Route Frequency Provider Last Rate Last Dose  . durvalumab (IMFINZI) 1,120 mg in sodium chloride 0.9 % 100 mL chemo infusion  1,120 mg Intravenous Once Curt Bears, MD 122 mL/hr at 04/11/17 1232 1,120 mg at 04/11/17 1232  . heparin lock flush 100 unit/mL  500 Units Intracatheter Once PRN Curt Bears, MD      . sodium chloride flush (NS) 0.9 % injection 10 mL  10 mL Intracatheter PRN Curt Bears, MD        SURGICAL HISTORY:  Past Surgical History:  Procedure Laterality Date  . ANKLE SURGERY     x2  . APPENDECTOMY    . CARPAL TUNNEL RELEASE    . CHOLECYSTECTOMY    . IR FLUORO GUIDE PORT INSERTION RIGHT  03/19/2017  . IR US GUIDE VASC ACCESS RIGHT  03/19/2017  . OVARIAN CYST REMOVAL    . ROTATOR CUFF REPAIR    . TUBAL LIGATION    . VIDEO BRONCHOSCOPY WITH ENDOBRONCHIAL ULTRASOUND N/A 09/14/2016   Procedure: VIDEO BRONCHOSCOPY WITH ENDOBRONCHIAL ULTRASOUND;  Surgeon: Melrose Nakayama, MD;  Location: [facility] OR;  Service: Thoracic;  Laterality: N/A;    REVIEW OF SYSTEMS:   Review of Systems  Constitutional: Negative for appetite change, chills, fatigue, fever and unexpected weight change.  HENT:   Negative for mouth sores, nosebleeds, sore throat and trouble swallowing.   Eyes: Negative for eye problems and icterus.  Respiratory: Negative for cough,  hemoptysis, shortness of breath at rest and wheezing.  Positive for dyspnea on exertion. Cardiovascular: Negative for chest pain and leg swelling.  Gastrointestinal: Negative for abdominal pain, diarrhea, nausea and vomiting. Positive for constipation. Genitourinary: Negative for bladder incontinence, difficulty urinating, dysuria, frequency and hematuria.   Musculoskeletal: Negative for back pain, gait problem, neck pain and neck stiffness. Positive for left flank pain. Skin: Negative for itching and rash.  Neurological: Negative for dizziness, extremity weakness, gait problem, headaches, light-headedness and seizures.  Hematological: Negative for adenopathy. Does not bruise/bleed easily.  Psychiatric/Behavioral: Negative for confusion, depression and sleep disturbance. The patient is not nervous/anxious.     PHYSICAL EXAMINATION:  Blood pressure (!) 105/45, pulse 84, temperature 98.4 F (36.9 C), temperature source Oral, resp. rate 20, weight 243 lb 12.8 oz (110.6 kg),  SpO2 97 %.  ECOG PERFORMANCE STATUS: 1 - Symptomatic but completely ambulatory  Physical Exam  Constitutional: Oriented to person, place, and time and well-developed, well-nourished, and in no distress. No distress.  HENT:  Head: Normocephalic and atraumatic.  Mouth/Throat: Oropharynx is clear and moist. No oropharyngeal exudate.  Eyes: Conjunctivae are normal. Right eye exhibits no discharge. Left eye exhibits no discharge. No scleral icterus.  Neck: Normal range of motion. Neck supple.  Cardiovascular: Normal rate, regular rhythm, normal heart sounds and intact distal pulses.   Pulmonary/Chest: Effort normal and breath sounds normal. No respiratory distress. No wheezes. No rales.  Abdominal: Soft. Bowel sounds are normal. Exhibits no distension and no mass. There is no tenderness.  Musculoskeletal: Normal range of motion. Exhibits no edema.  Lymphadenopathy:    No cervical adenopathy.  Neurological: Alert and oriented  to person, place, and time. Exhibits normal muscle tone. Gait normal. Coordination normal.  Skin: Skin is warm and dry. No rash noted. Not diaphoretic. No erythema. No pallor.  Psychiatric: Mood, memory and judgment normal.  Vitals reviewed.  LABORATORY DATA: Lab Results  Component Value Date   WBC 6.3 04/11/2017   HGB 13.0 04/11/2017   HCT 40.3 04/11/2017   MCV 97.5 04/11/2017   PLT 196 04/11/2017      Chemistry      Component Value Date/Time   NA 140 04/11/2017 0909   K 3.7 04/11/2017 0909   CL 103 03/03/2017 0534   CO2 26 04/11/2017 0909   BUN 5.6 (L) 04/11/2017 0909   CREATININE 0.9 04/11/2017 0909      Component Value Date/Time   CALCIUM 9.3 04/11/2017 0909   ALKPHOS 95 04/11/2017 0909   AST 18 04/11/2017 0909   ALT 17 04/11/2017 0909   BILITOT 0.35 04/11/2017 0909       RADIOGRAPHIC STUDIES:  Ir US Guide Vasc Access Right  Result Date: 03/19/2017 INDICATION: 66 year old with squamous cell carcinoma of the left lung. Port-A-Cath needed for treatment. EXAM: FLUOROSCOPIC AND ULTRASOUND GUIDED PLACEMENT OF A SUBCUTANEOUS PORT COMPARISON:  None. MEDICATIONS: Ancef 2 g; The antibiotic was administered within an appropriate time interval prior to skin puncture. ANESTHESIA/SEDATION: Versed 2.0 mg IV; Fentanyl 100 mcg IV; Moderate Sedation Time:  35 minutes The patient was continuously monitored during the procedure by the interventional radiology nurse under my direct supervision. FLUOROSCOPY TIME:  1 minute, 6 seconds (13 mGy) COMPLICATIONS: None immediate. PROCEDURE: The procedure, risks, benefits, and alternatives were explained to the patient. Questions regarding the procedure were encouraged and answered. The patient understands and consents to the procedure. Patient was placed supine on the interventional table. Ultrasound confirmed a patent right internal jugular vein. The right chest and neck were cleaned with a skin antiseptic and a sterile drape was placed. Maximal  barrier sterile technique was utilized including caps, mask, sterile gowns, sterile gloves, sterile drape, hand hygiene and skin antiseptic. The right neck was anesthetized with 1% lidocaine. Small incision was made in the right neck with a blade. Micropuncture set was placed in the right internal jugular vein with ultrasound guidance. The micropuncture wire was used for measurement purposes. The right chest was anesthetized with 1% lidocaine with epinephrine. #15 blade was used to make an incision and a subcutaneous port pocket was formed. [place] was assembled. Subcutaneous tunnel was formed with a stiff tunneling device. The port catheter was brought through the subcutaneous tunnel. The port was placed in the subcutaneous pocket and sutured in place. The micropuncture set  was exchanged for a peel-away sheath. The catheter was placed through the peel-away sheath and the tip was positioned at the superior cavoatrial junction. Catheter placement was confirmed with fluoroscopy. The port was accessed and flushed with heparinized saline. The port pocket was closed using two layers of absorbable sutures and Dermabond. The vein skin site was closed using a single layer of absorbable suture and Dermabond. Sterile dressings were applied. Patient tolerated the procedure well without an immediate complication. Ultrasound and fluoroscopic images were taken and saved for this procedure. IMPRESSION: Placement of a subcutaneous port device. Electronically Signed   By: Markus Daft M.D.   On: 03/19/2017 13:48   Ir Fluoro Guide Port Insertion Right  Result Date: 03/19/2017 INDICATION: 66 year old with squamous cell carcinoma of the left lung. Port-A-Cath needed for treatment. EXAM: FLUOROSCOPIC AND ULTRASOUND GUIDED PLACEMENT OF A SUBCUTANEOUS PORT COMPARISON:  None. MEDICATIONS: Ancef 2 g; The antibiotic was administered within an appropriate time interval prior to skin puncture. ANESTHESIA/SEDATION: Versed 2.0 mg  IV; Fentanyl 100 mcg IV; Moderate Sedation Time:  35 minutes The patient was continuously monitored during the procedure by the interventional radiology nurse under my direct supervision. FLUOROSCOPY TIME:  1 minute, 6 seconds (13 mGy) COMPLICATIONS: None immediate. PROCEDURE: The procedure, risks, benefits, and alternatives were explained to the patient. Questions regarding the procedure were encouraged and answered. The patient understands and consents to the procedure. Patient was placed supine on the interventional table. Ultrasound confirmed a patent right internal jugular vein. The right chest and neck were cleaned with a skin antiseptic and a sterile drape was placed. Maximal barrier sterile technique was utilized including caps, mask, sterile gowns, sterile gloves, sterile drape, hand hygiene and skin antiseptic. The right neck was anesthetized with 1% lidocaine. Small incision was made in the right neck with a blade. Micropuncture set was placed in the right internal jugular vein with ultrasound guidance. The micropuncture wire was used for measurement purposes. The right chest was anesthetized with 1% lidocaine with epinephrine. #15 blade was used to make an incision and a subcutaneous port pocket was formed. [place] was assembled. Subcutaneous tunnel was formed with a stiff tunneling device. The port catheter was brought through the subcutaneous tunnel. The port was placed in the subcutaneous pocket and sutured in place. The micropuncture set was exchanged for a peel-away sheath. The catheter was placed through the peel-away sheath and the tip was positioned at the superior cavoatrial junction. Catheter placement was confirmed with fluoroscopy. The port was accessed and flushed with heparinized saline. The port pocket was closed using two layers of absorbable sutures and Dermabond. The vein skin site was closed using a single layer of absorbable suture and Dermabond. Sterile dressings were  applied. Patient tolerated the procedure well without an immediate complication. Ultrasound and fluoroscopic images were taken and saved for this procedure. IMPRESSION: Placement of a subcutaneous port device. Electronically Signed   By: Markus Daft M.D.   On: 03/19/2017 13:48     ASSESSMENT/PLAN:  Squamous cell carcinoma of lung, left (HCC) This is a very pleasant 66 year old with unresectable a stage IIIa non-small cell lung cancer. The patient underwent a course of concurrent chemoradiation with weekly carboplatin and paclitaxel status post 8 cycles and has been tolerating this treatment fairly well except for mild fatigue and odynophagia. The patient is now on Imfinzi status post 3 cycles.  The patient continues to tolerate her Imfinzi well.  Recommend that she proceed with cycle 4 as  scheduled today.  The patient reports left flank pain which is been present for 3-4 days.  Pain is controlled with hydrocodone.  She is not have any urinary symptoms, but will check a urinalysis and urine culture today.  Pain could also be related to constipation and we discussed starting Senokot-S 2 tablets twice a day for this.  She was advised to call us if her pain worsens.  The patient will be seen back for follow-up in 2 weeks for evaluation prior to cycle #5 of Infinzi.  She was advised to call immediately if she has any concerning symptoms in the interval.  The patient voices understanding of current disease status and treatment options and is in agreement with the current care plan. All questions were answered. The patient knows to call the clinic with any problems, questions or concerns. We can certainly see the patient much sooner if necessary.  Orders Placed This Encounter  Procedures  . Urine Culture    Standing Status:   Future    Number of Occurrences:   1    Standing Expiration Date:   04/11/2018  . Urinalysis, Microscopic - [facility]    Standing Status:   Future    Number of Occurrences:    1    Standing Expiration Date:   04/11/2018    Mikey Bussing, DNP, AGPCNP-BC, AOCNP 04/11/17

## 2017-04-11 NOTE — Telephone Encounter (Signed)
Telephone call to patient in regards to uncollected urine sample. Patient was not able to obtain sample prior to leaving clinic today. Patient states she has an appointment with PCP tomorrow and will discuss symptoms and testing with him tomorrow. Patient understands to call this office with any questions or concerns.

## 2017-04-12 ENCOUNTER — Ambulatory Visit (INDEPENDENT_AMBULATORY_CARE_PROVIDER_SITE_OTHER): Payer: Medicare Other | Admitting: Family Medicine

## 2017-04-12 ENCOUNTER — Telehealth: Payer: Self-pay | Admitting: *Deleted

## 2017-04-12 ENCOUNTER — Other Ambulatory Visit: Payer: Self-pay | Admitting: Medical Oncology

## 2017-04-12 ENCOUNTER — Telehealth: Payer: Self-pay | Admitting: Medical Oncology

## 2017-04-12 DIAGNOSIS — C3492 Malignant neoplasm of unspecified part of left bronchus or lung: Secondary | ICD-10-CM

## 2017-04-12 DIAGNOSIS — Z7901 Long term (current) use of anticoagulants: Secondary | ICD-10-CM

## 2017-04-12 DIAGNOSIS — N390 Urinary tract infection, site not specified: Secondary | ICD-10-CM | POA: Diagnosis not present

## 2017-04-12 LAB — POCT URINALYSIS DIPSTICK
SPEC GRAV UA: 1.015 (ref 1.010–1.025)
pH, UA: 5 (ref 5.0–8.0)

## 2017-04-12 LAB — POCT INR: INR: 1.4

## 2017-04-12 MED ORDER — HYDROCODONE-ACETAMINOPHEN 7.5-325 MG PO TABS: ORAL_TABLET | ORAL | 0 refills | Status: DC

## 2017-04-12 NOTE — Telephone Encounter (Signed)
Is getting her urine test at PCP today.

## 2017-04-12 NOTE — Telephone Encounter (Signed)
Pt dropped off urine to be looked at. Dipstick results put in. Pt having flnak pain for the past 5 days. Tried to get urine at specialist office yesterday. Unable to do so. Urine culture sent per dr Richardson Landry.   Pt requested a refill on hydrocoodne. Ok per dr Richardson Landry. Script given to pt at office visit.   Pt notified she will be called back if she needs antbiotic after dr Richardson Landry checks urine.   [facility].

## 2017-04-13 ENCOUNTER — Other Ambulatory Visit: Payer: Self-pay

## 2017-04-13 MED ORDER — NITROFURANTOIN MONOHYD MACRO 100 MG PO CAPS: 100.0000 mg | ORAL_CAPSULE | Freq: Two times a day (BID) | ORAL | 0 refills | Status: DC

## 2017-04-13 NOTE — Progress Notes (Signed)
Results for orders placed or performed in visit on 04/12/17  POCT INR  Result Value Ref Range   INR 1.4   POCT urinalysis dipstick  Result Value Ref Range   Color, UA     Clarity, UA     Glucose, UA     Bilirubin, UA     Ketones, UA     Spec Grav, UA 1.015 1.010 - 1.025   Blood, UA     pH, UA 5.0 5.0 - 8.0   Protein, UA     Urobilinogen, UA  0.2 or 1.0 E.U./dL   Nitrite, UA +    Leukocytes, UA Moderate (2+) (A) Negative   Appearance     Odor

## 2017-04-15 LAB — URINE CULTURE

## 2017-04-22 ENCOUNTER — Other Ambulatory Visit: Payer: Self-pay | Admitting: Family Medicine

## 2017-04-26 ENCOUNTER — Ambulatory Visit (HOSPITAL_BASED_OUTPATIENT_CLINIC_OR_DEPARTMENT_OTHER): Payer: Medicare Other | Admitting: Nurse Practitioner

## 2017-04-26 ENCOUNTER — Ambulatory Visit: Payer: Medicare Other

## 2017-04-26 ENCOUNTER — Ambulatory Visit (HOSPITAL_BASED_OUTPATIENT_CLINIC_OR_DEPARTMENT_OTHER): Payer: Medicare Other

## 2017-04-26 ENCOUNTER — Other Ambulatory Visit (HOSPITAL_BASED_OUTPATIENT_CLINIC_OR_DEPARTMENT_OTHER): Payer: Medicare Other

## 2017-04-26 ENCOUNTER — Encounter: Payer: Self-pay | Admitting: Nurse Practitioner

## 2017-04-26 ENCOUNTER — Telehealth: Payer: Self-pay | Admitting: Nurse Practitioner

## 2017-04-26 VITALS — BP 124/68 | HR 85 | Temp 98.4°F | Resp 19 | Ht 67.0 in | Wt 243.5 lb

## 2017-04-26 DIAGNOSIS — C3432 Malignant neoplasm of lower lobe, left bronchus or lung: Secondary | ICD-10-CM

## 2017-04-26 DIAGNOSIS — Z95828 Presence of other vascular implants and grafts: Secondary | ICD-10-CM

## 2017-04-26 DIAGNOSIS — C3490 Malignant neoplasm of unspecified part of unspecified bronchus or lung: Secondary | ICD-10-CM

## 2017-04-26 DIAGNOSIS — Z5112 Encounter for antineoplastic immunotherapy: Secondary | ICD-10-CM | POA: Diagnosis not present

## 2017-04-26 DIAGNOSIS — R0609 Other forms of dyspnea: Secondary | ICD-10-CM | POA: Diagnosis not present

## 2017-04-26 DIAGNOSIS — C3492 Malignant neoplasm of unspecified part of left bronchus or lung: Secondary | ICD-10-CM

## 2017-04-26 LAB — CBC WITH DIFFERENTIAL/PLATELET
BASO%: 0.4 % (ref 0.0–2.0)
BASOS ABS: 0 10*3/uL (ref 0.0–0.1)
EOS ABS: 0.1 10*3/uL (ref 0.0–0.5)
EOS%: 2 % (ref 0.0–7.0)
HCT: 39.1 % (ref 34.8–46.6)
HGB: 12.5 g/dL (ref 11.6–15.9)
LYMPH%: 6 % — AB (ref 14.0–49.7)
MCH: 31.5 pg (ref 25.1–34.0)
MCHC: 32 g/dL (ref 31.5–36.0)
MCV: 98.5 fL (ref 79.5–101.0)
MONO#: 0.6 10*3/uL (ref 0.1–0.9)
MONO%: 11.5 % (ref 0.0–14.0)
NEUT%: 80.1 % — AB (ref 38.4–76.8)
NEUTROS ABS: 4.4 10*3/uL (ref 1.5–6.5)
PLATELETS: 208 10*3/uL (ref 145–400)
RBC: 3.97 10*6/uL (ref 3.70–5.45)
RDW: 13.6 % (ref 11.2–14.5)
WBC: 5.5 10*3/uL (ref 3.9–10.3)
lymph#: 0.3 10*3/uL — ABNORMAL LOW (ref 0.9–3.3)

## 2017-04-26 LAB — COMPREHENSIVE METABOLIC PANEL
ALK PHOS: 99 U/L (ref 40–150)
ALT: 12 U/L (ref 0–55)
ANION GAP: 9 meq/L (ref 3–11)
AST: 14 U/L (ref 5–34)
Albumin: 3.3 g/dL — ABNORMAL LOW (ref 3.5–5.0)
BILIRUBIN TOTAL: 0.28 mg/dL (ref 0.20–1.20)
BUN: 7.5 mg/dL (ref 7.0–26.0)
CO2: 29 mEq/L (ref 22–29)
Calcium: 9.1 mg/dL (ref 8.4–10.4)
Chloride: 103 mEq/L (ref 98–109)
Creatinine: 0.9 mg/dL (ref 0.6–1.1)
Glucose: 112 mg/dl (ref 70–140)
POTASSIUM: 3.4 meq/L — AB (ref 3.5–5.1)
Sodium: 142 mEq/L (ref 136–145)
TOTAL PROTEIN: 6.6 g/dL (ref 6.4–8.3)

## 2017-04-26 MED ORDER — SODIUM CHLORIDE 0.9 % IV SOLN
Freq: Once | INTRAVENOUS | Status: AC
  Administered 2017-04-26: 15:00:00 via INTRAVENOUS

## 2017-04-26 MED ORDER — SODIUM CHLORIDE 0.9% FLUSH
10.0000 mL | INTRAVENOUS | Status: DC | PRN
  Administered 2017-04-26: 10 mL
  Filled 2017-04-26: qty 10

## 2017-04-26 MED ORDER — SODIUM CHLORIDE 0.9% FLUSH
10.0000 mL | INTRAVENOUS | Status: DC | PRN
  Administered 2017-04-26: 10 mL via INTRAVENOUS
  Filled 2017-04-26: qty 10

## 2017-04-26 MED ORDER — SODIUM CHLORIDE 0.9 % IV SOLN
1120.0000 mg | Freq: Once | INTRAVENOUS | Status: AC
  Administered 2017-04-26: 1120 mg via INTRAVENOUS
  Filled 2017-04-26: qty 20

## 2017-04-26 MED ORDER — HEPARIN SOD (PORK) LOCK FLUSH 100 UNIT/ML IV SOLN
500.0000 [IU] | Freq: Once | INTRAVENOUS | Status: AC | PRN
  Administered 2017-04-26: 500 [IU]
  Filled 2017-04-26: qty 5

## 2017-04-26 NOTE — Telephone Encounter (Signed)
No 12/27 los.

## 2017-04-26 NOTE — Progress Notes (Signed)
  [name] OFFICE PROGRESS NOTE   DIAGNOSIS: Unresectable stage IIIA(T4, N0, M0) non-small cell lung cancer, squamous cell carcinoma diagnosed in May 2018 and presented with large obstructing left lower lobe lung mass suspicious small mediastinal lymphadenopathy.  PRIOR THERAPY: A course of concurrent chemoradiation with weekly carboplatin for AUC of 2 and paclitaxel 45 MG/M2. Status post 5 cycles. She received her radiation in [facility]. Last dose of chemotherapy was given 11/27/2016.  CURRENT THERAPY: consolidation immunotherapy with Imfinzi (Durvalumab) 10 MG/KG every 2 weeks,first dose 02/21/2017.Status post4cycles.     INTERVAL HISTORY:   Sarah [name] returns as scheduled.  She completed cycle 4 Imfinzi 04/11/2017.  She denies nausea/vomiting.  No mouth sores.  No diarrhea.  No rash.  She has stable dyspnea.  No significant cough. She has good appetite.  Left flank pain is better since completing a course of antibiotic.  Objective:  Vital signs in last 24 hours:  Blood pressure 124/68, pulse 85, temperature 98.4 F (36.9 C), temperature source Oral, resp. rate 19, height 5\' 7"  (1.702 m), weight 243 lb 8 oz (110.5 kg), SpO2 95 %.    HEENT: No thrush or ulcers. Resp: Lungs clear bilaterally. Cardio: Regular rate and rhythm. GI: Abdomen soft and nontender.  No hepatomegaly. Vascular: Chronic edema/stasis change left lower leg (per patient report due to remote ankle injury/surgery). Neuro: Alert and oriented. Skin: No rash. Port-A-Cath without erythema.   Lab Results:  Lab Results  Component Value Date   WBC 5.5 04/26/2017   HGB 12.5 04/26/2017   HCT 39.1 04/26/2017   MCV 98.5 04/26/2017   PLT 208 04/26/2017   NEUTROABS 4.4 04/26/2017    Imaging:  No results found.  Medications: I have reviewed the patient's current medications.  Assessment/Plan: 1. Unresectable stage IIIa non-small cell lung cancer. Currently undergoing a course  of concurrent chemoradiation with weekly carboplatin and paclitaxel status post 8cycles, radiation completed 11/30/2016.  Consolidation immunotherapy with Imfinzi beginning 02/21/2017.  Disposition: [name] appears stable.  She has completed 4 cycles of Imfinzi.  Plan to proceed with cycle 5 today as scheduled.  She will return for a follow-up visit and cycle 6 in 2 weeks.  She will contact the office in the interim with any problems.    Sarah [name] ANP/GNP-BC   04/26/2017  12:17 PM

## 2017-05-07 ENCOUNTER — Ambulatory Visit (INDEPENDENT_AMBULATORY_CARE_PROVIDER_SITE_OTHER): Payer: Medicare Other | Admitting: Family Medicine

## 2017-05-07 ENCOUNTER — Encounter: Payer: Self-pay | Admitting: Family Medicine

## 2017-05-07 VITALS — BP 118/68 | Temp 98.3°F

## 2017-05-07 DIAGNOSIS — J449 Chronic obstructive pulmonary disease, unspecified: Secondary | ICD-10-CM

## 2017-05-07 DIAGNOSIS — M1990 Unspecified osteoarthritis, unspecified site: Secondary | ICD-10-CM

## 2017-05-07 DIAGNOSIS — C3492 Malignant neoplasm of unspecified part of left bronchus or lung: Secondary | ICD-10-CM | POA: Diagnosis not present

## 2017-05-07 DIAGNOSIS — Z7901 Long term (current) use of anticoagulants: Secondary | ICD-10-CM

## 2017-05-07 LAB — POCT INR: INR: 1.4

## 2017-05-07 MED ORDER — HYDROCODONE-ACETAMINOPHEN 7.5-325 MG PO TABS: ORAL_TABLET | ORAL | 0 refills | Status: DC

## 2017-05-07 MED ORDER — AMOXICILLIN 500 MG PO CAPS: 500.0000 mg | ORAL_CAPSULE | Freq: Three times a day (TID) | ORAL | 0 refills | Status: DC

## 2017-05-07 NOTE — Progress Notes (Signed)
   Subjective:    Patient ID: Sarah [name], female    DOB: [birth year], 67 y.o.   MRN: [id number]  [place] today 1.4.  Coughing up green mucus.   feelimg tired  Prod greenish discharge   Sone sob and wheezingn   Review of Systems No headache, no major weight loss or weight gain, no chest pain no back pain abdominal pain no change in bowel habits complete ROS otherwise negative     Objective:   Physical Exam  Alert active mild malaise.  Lungs bronchial cough positive nasal congestion heart regular rate and rhythm.      Assessment & Plan:  Impression rhinosinusitis/bronchitis exacerbation of COPD.  Recent pneumonia so somewhat concerning.  Warning signs discussed antibiotics prescribed follow-up INR just a couple weeks office visit 2 months

## 2017-05-09 ENCOUNTER — Inpatient Hospital Stay: Payer: Medicare Other

## 2017-05-09 ENCOUNTER — Inpatient Hospital Stay: Payer: Medicare Other | Attending: Internal Medicine

## 2017-05-09 ENCOUNTER — Encounter: Payer: Self-pay | Admitting: Internal Medicine

## 2017-05-09 ENCOUNTER — Inpatient Hospital Stay (HOSPITAL_BASED_OUTPATIENT_CLINIC_OR_DEPARTMENT_OTHER): Payer: Medicare Other | Admitting: Internal Medicine

## 2017-05-09 DIAGNOSIS — Z86711 Personal history of pulmonary embolism: Secondary | ICD-10-CM | POA: Insufficient documentation

## 2017-05-09 DIAGNOSIS — Z5112 Encounter for antineoplastic immunotherapy: Secondary | ICD-10-CM | POA: Insufficient documentation

## 2017-05-09 DIAGNOSIS — M129 Arthropathy, unspecified: Secondary | ICD-10-CM | POA: Diagnosis not present

## 2017-05-09 DIAGNOSIS — R251 Tremor, unspecified: Secondary | ICD-10-CM

## 2017-05-09 DIAGNOSIS — I480 Paroxysmal atrial fibrillation: Secondary | ICD-10-CM | POA: Insufficient documentation

## 2017-05-09 DIAGNOSIS — M4800 Spinal stenosis, site unspecified: Secondary | ICD-10-CM | POA: Diagnosis not present

## 2017-05-09 DIAGNOSIS — Z86718 Personal history of other venous thrombosis and embolism: Secondary | ICD-10-CM | POA: Insufficient documentation

## 2017-05-09 DIAGNOSIS — R05 Cough: Secondary | ICD-10-CM | POA: Diagnosis not present

## 2017-05-09 DIAGNOSIS — J449 Chronic obstructive pulmonary disease, unspecified: Secondary | ICD-10-CM | POA: Diagnosis not present

## 2017-05-09 DIAGNOSIS — K219 Gastro-esophageal reflux disease without esophagitis: Secondary | ICD-10-CM

## 2017-05-09 DIAGNOSIS — D3501 Benign neoplasm of right adrenal gland: Secondary | ICD-10-CM | POA: Insufficient documentation

## 2017-05-09 DIAGNOSIS — G8929 Other chronic pain: Secondary | ICD-10-CM | POA: Insufficient documentation

## 2017-05-09 DIAGNOSIS — I878 Other specified disorders of veins: Secondary | ICD-10-CM | POA: Diagnosis not present

## 2017-05-09 DIAGNOSIS — I313 Pericardial effusion (noninflammatory): Secondary | ICD-10-CM | POA: Insufficient documentation

## 2017-05-09 DIAGNOSIS — R599 Enlarged lymph nodes, unspecified: Secondary | ICD-10-CM | POA: Diagnosis not present

## 2017-05-09 DIAGNOSIS — Z9049 Acquired absence of other specified parts of digestive tract: Secondary | ICD-10-CM | POA: Insufficient documentation

## 2017-05-09 DIAGNOSIS — Z923 Personal history of irradiation: Secondary | ICD-10-CM

## 2017-05-09 DIAGNOSIS — E669 Obesity, unspecified: Secondary | ICD-10-CM | POA: Diagnosis not present

## 2017-05-09 DIAGNOSIS — M549 Dorsalgia, unspecified: Secondary | ICD-10-CM | POA: Diagnosis not present

## 2017-05-09 DIAGNOSIS — M459 Ankylosing spondylitis of unspecified sites in spine: Secondary | ICD-10-CM | POA: Diagnosis not present

## 2017-05-09 DIAGNOSIS — N281 Cyst of kidney, acquired: Secondary | ICD-10-CM | POA: Diagnosis not present

## 2017-05-09 DIAGNOSIS — I700 Atherosclerosis of aorta: Secondary | ICD-10-CM | POA: Diagnosis not present

## 2017-05-09 DIAGNOSIS — Z9981 Dependence on supplemental oxygen: Secondary | ICD-10-CM | POA: Insufficient documentation

## 2017-05-09 DIAGNOSIS — R0602 Shortness of breath: Secondary | ICD-10-CM | POA: Insufficient documentation

## 2017-05-09 DIAGNOSIS — C3432 Malignant neoplasm of lower lobe, left bronchus or lung: Secondary | ICD-10-CM | POA: Insufficient documentation

## 2017-05-09 DIAGNOSIS — J90 Pleural effusion, not elsewhere classified: Secondary | ICD-10-CM | POA: Diagnosis not present

## 2017-05-09 DIAGNOSIS — R5382 Chronic fatigue, unspecified: Secondary | ICD-10-CM

## 2017-05-09 DIAGNOSIS — G4730 Sleep apnea, unspecified: Secondary | ICD-10-CM | POA: Insufficient documentation

## 2017-05-09 DIAGNOSIS — Z79899 Other long term (current) drug therapy: Secondary | ICD-10-CM

## 2017-05-09 DIAGNOSIS — C3492 Malignant neoplasm of unspecified part of left bronchus or lung: Secondary | ICD-10-CM

## 2017-05-09 DIAGNOSIS — Z7901 Long term (current) use of anticoagulants: Secondary | ICD-10-CM | POA: Insufficient documentation

## 2017-05-09 DIAGNOSIS — C3490 Malignant neoplasm of unspecified part of unspecified bronchus or lung: Secondary | ICD-10-CM

## 2017-05-09 DIAGNOSIS — F419 Anxiety disorder, unspecified: Secondary | ICD-10-CM | POA: Insufficient documentation

## 2017-05-09 LAB — CBC WITH DIFFERENTIAL/PLATELET
Abs Granulocyte: 4.9 10*3/uL (ref 1.5–6.5)
BASOS PCT: 1 %
Basophils Absolute: 0 10*3/uL (ref 0.0–0.1)
EOS PCT: 2 %
Eosinophils Absolute: 0.1 10*3/uL (ref 0.0–0.5)
HCT: 39 % (ref 34.8–46.6)
Hemoglobin: 12.9 g/dL (ref 11.6–15.9)
Lymphocytes Relative: 5 %
Lymphs Abs: 0.3 10*3/uL — ABNORMAL LOW (ref 0.9–3.3)
MCH: 31.5 pg (ref 25.1–34.0)
MCHC: 33.1 g/dL (ref 31.5–36.0)
MCV: 95 fL (ref 79.5–101.0)
MONO ABS: 0.5 10*3/uL (ref 0.1–0.9)
Monocytes Relative: 8 %
NEUTROS PCT: 84 %
Neutro Abs: 4.9 10*3/uL (ref 1.5–6.5)
PLATELETS: 186 10*3/uL (ref 145–400)
RBC: 4.1 MIL/uL (ref 3.70–5.45)
RDW: 14.9 % (ref 11.2–16.1)
WBC: 5.8 10*3/uL (ref 3.9–10.3)

## 2017-05-09 LAB — COMPREHENSIVE METABOLIC PANEL
ALBUMIN: 3.7 g/dL (ref 3.5–5.0)
ALT: 16 U/L (ref 0–55)
AST: 16 U/L (ref 5–34)
Alkaline Phosphatase: 93 U/L (ref 40–150)
Anion gap: 9 (ref 3–11)
BUN: 9 mg/dL (ref 7–26)
CHLORIDE: 103 mmol/L (ref 98–109)
CO2: 28 mmol/L (ref 22–29)
Calcium: 9.3 mg/dL (ref 8.4–10.4)
Creatinine, Ser: 0.96 mg/dL (ref 0.60–1.10)
GFR calc Af Amer: >60 mL/min (ref >60)
GFR calc non Af Amer: >60 mL/min (ref >60)
GLUCOSE: 95 mg/dL (ref 70–140)
POTASSIUM: 3.8 mmol/L (ref 3.3–4.7)
SODIUM: 140 mmol/L (ref 136–145)
Total Bilirubin: 0.3 mg/dL (ref 0.2–1.2)
Total Protein: 7 g/dL (ref 6.4–8.3)

## 2017-05-09 LAB — TSH: TSH: 1.553 u[IU]/mL (ref 0.308–3.960)

## 2017-05-09 MED ORDER — SODIUM CHLORIDE 0.9 % IV SOLN
10.2000 mg/kg | Freq: Once | INTRAVENOUS | Status: AC
  Administered 2017-05-09: 1120 mg via INTRAVENOUS
  Filled 2017-05-09: qty 20

## 2017-05-09 MED ORDER — SODIUM CHLORIDE 0.9% FLUSH
10.0000 mL | INTRAVENOUS | Status: DC | PRN
  Administered 2017-05-09: 10 mL
  Filled 2017-05-09: qty 10

## 2017-05-09 MED ORDER — HEPARIN SOD (PORK) LOCK FLUSH 100 UNIT/ML IV SOLN
500.0000 [IU] | Freq: Once | INTRAVENOUS | Status: AC | PRN
  Administered 2017-05-09: 500 [IU]
  Filled 2017-05-09: qty 5

## 2017-05-09 MED ORDER — SODIUM CHLORIDE 0.9 % IV SOLN
Freq: Once | INTRAVENOUS | Status: AC
  Administered 2017-05-09: 12:00:00 via INTRAVENOUS

## 2017-05-09 NOTE — Patient Instructions (Signed)
Implanted Port Home Guide An implanted port is a type of central line that is placed under the skin. Central lines are used to provide IV access when treatment or nutrition needs to be given through a person's veins. Implanted ports are used for long-term IV access. An implanted port may be placed because:  You need IV medicine that would be irritating to the small veins in your hands or arms.  You need long-term IV medicines, such as antibiotics.  You need IV nutrition for a long period.  You need frequent blood draws for lab tests.  You need dialysis.  Implanted ports are usually placed in the chest area, but they can also be placed in the upper arm, the abdomen, or the leg. An implanted port has two main parts:  Reservoir. The reservoir is round and will appear as a small, raised area under your skin. The reservoir is the part where a needle is inserted to give medicines or draw blood.  Catheter. The catheter is a thin, flexible tube that extends from the reservoir. The catheter is placed into a large vein. Medicine that is inserted into the reservoir goes into the catheter and then into the vein.  How will I care for my incision site? Do not get the incision site wet. Bathe or shower as directed by your health care provider. How is my port accessed? Special steps must be taken to access the port:  Before the port is accessed, a numbing cream can be placed on the skin. This helps numb the skin over the port site.  Your health care provider uses a sterile technique to access the port. ? Your health care provider must put on a mask and sterile gloves. ? The skin over your port is cleaned carefully with an antiseptic and allowed to dry. ? The port is gently pinched between sterile gloves, and a needle is inserted into the port.  Only "non-coring" port needles should be used to access the port. Once the port is accessed, a blood return should be checked. This helps ensure that the port  is in the vein and is not clogged.  If your port needs to remain accessed for a constant infusion, a clear (transparent) bandage will be placed over the needle site. The bandage and needle will need to be changed every week, or as directed by your health care provider.  Keep the bandage covering the needle clean and dry. Do not get it wet. Follow your health care provider's instructions on how to take a shower or bath while the port is accessed.  If your port does not need to stay accessed, no bandage is needed over the port.  What is flushing? Flushing helps keep the port from getting clogged. Follow your health care provider's instructions on how and when to flush the port. Ports are usually flushed with saline solution or a medicine called heparin. The need for flushing will depend on how the port is used.  If the port is used for intermittent medicines or blood draws, the port will need to be flushed: ? After medicines have been given. ? After blood has been drawn. ? As part of routine maintenance.  If a constant infusion is running, the port may not need to be flushed.  How long will my port stay implanted? The port can stay in for as long as your health care provider thinks it is needed. When it is time for the port to come out, surgery will be   done to remove it. The procedure is similar to the one performed when the port was put in. When should I seek immediate medical care? When you have an implanted port, you should seek immediate medical care if:  You notice a bad smell coming from the incision site.  You have swelling, redness, or drainage at the incision site.  You have more swelling or pain at the port site or the surrounding area.  You have a fever that is not controlled with medicine.  This information is not intended to replace advice given to you by your health care provider. Make sure you discuss any questions you have with your health care provider. Document  Released: 04/17/2005 Document Revised: 09/23/2015 Document Reviewed: 12/23/2012 Elsevier Interactive Patient Education  2017 [employer]  

## 2017-05-09 NOTE — Progress Notes (Signed)
[facility] [phone]   Fax:([phone]) [phone]  OFFICE PROGRESS NOTE  [name], MD [address] [place] [address]  DIAGNOSIS: Unresectable stage IIIA (T4, N0, M0) non-small cell lung cancer, squamous cell carcinoma diagnosed in May 2018 and presented with large obstructing left lower lobe lung mass suspicious small mediastinal lymphadenopathy.  PRIOR THERAPY: A course of concurrent chemoradiation with weekly carboplatin for AUC of 2 and paclitaxel 45 MG/M2. Status post 5 cycles. She received her radiation in [facility]. Last dose of chemotherapy was given 11/27/2016.Marland Kitchen  CURRENT THERAPY: consolidation immunotherapy with Imfinzi (Durvalumab) 10 MG/KG every 2 weeks, first dose 02/21/2017.  Status post 5 cycles.  INTERVAL HISTORY: Sarah [name] 67 y.o. female returns to the clinic today for follow-up visit accompanied by her husband.  The patient is feeling fine today with no specific complaints except for productive cough started last week.  She was seen by her primary care physician 2 days ago and started on treatment with amoxicillin.  She denied having any chest pain but has baseline shortness of breath with no hemoptysis.  She denied having any weight loss or night sweats.  She has no nausea, vomiting, diarrhea or constipation.  She also has intermittent tremors in her hands but she has family history of tremors too.  She is here today for evaluation before starting cycle #6 of her treatment.  MEDICAL HISTORY: Past Medical History:  Diagnosis Date  . Arthritis   . Cancer ([facility])    lung cancer  . Chronic anxiety    Patient describes as stress  . Chronic back pain   . DVT (deep venous thrombosis) ([facility]) age 4  . GERD (gastroesophageal reflux disease)   . Goals of care, counseling/discussion 09/21/2016  . Headache(784.0)   . Hypoestrogenism   . Lower extremity venous stasis    LLE, chronic  . Obesity   . Odynophagia  11/13/2016  . Paroxysmal atrial fibrillation ([facility])   . PONV (postoperative nausea and vomiting)   . Pulmonary embolism ([facility])   . Reflux   . Sleep apnea    wear CPAP  . Spinal stenosis   . Tobacco abuse   . Venous stasis   . Warfarin anticoagulation     ALLERGIES:  is allergic to aspirin.  MEDICATIONS:  Current Outpatient Medications  Medication Sig Dispense Refill  . albuterol (PROVENTIL HFA;VENTOLIN HFA) 108 (90 Base) MCG/ACT inhaler Inhale 2 puffs into the lungs every 6 (six) hours as needed for wheezing or shortness of breath. 1 Inhaler 5  . amoxicillin (AMOXIL) 500 MG capsule Take 1 capsule (500 mg total) by mouth 3 (three) times daily. 30 capsule 0  . budesonide-formoterol (SYMBICORT) 160-4.5 MCG/ACT inhaler Inhale 2 puffs into the lungs 2 (two) times daily. 1 Inhaler 12  . buPROPion (WELLBUTRIN SR) 150 MG 12 hr tablet TAKE 1 TABLET BY MOUTH TWICE DAILY **KEEP APPT ON 06/09/16** (Patient taking differently: Take 150 mg by mouth daily. TAKE 1 TABLET BY MOUTH TWICE DAILY **KEEP APPT ON 06/09/16**) 180 tablet 0  . calcium carbonate (TUMS - DOSED IN MG ELEMENTAL CALCIUM) 500 MG chewable tablet Chew 1 tablet by mouth 2 (two) times a week.     . diltiazem (CARDIZEM CD) 180 MG 24 hr capsule TAKE ONE CAPSULE BY MOUTH EVERY DAY 90 capsule 1  . flecainide (TAMBOCOR) 50 MG tablet TAKE 1 TABLET (50 MG TOTAL) BY MOUTH 2 (TWO) TIMES DAILY. 60 tablet 2  .  HYDROcodone-acetaminophen (NORCO) 7.5-325 MG tablet One tablet up to three times a day as needed 90 tablet 0  . ketoconazole (NIZORAL) 2 % cream Apply BID for rash 30 g 2  . lidocaine-prilocaine (EMLA) cream Apply 1 application as needed topically. 30 g 2  . metoprolol tartrate (LOPRESSOR) 25 MG tablet TAKE 1 TABLET AS NEEDED FOR HEART RACING 10 tablet 5  . pantoprazole (PROTONIX) 40 MG tablet TAKE 1 TABLET EVERY MORNING 90 tablet 0  . warfarin (COUMADIN) 5 MG tablet Take 0-2.5 mg daily by mouth. Take 2.5 mg Daily except none on Monday and  Thursday.    . prochlorperazine (COMPAZINE) 10 MG tablet Take 1 tablet (10 mg total) by mouth every 6 (six) hours as needed for nausea or vomiting. (Patient not taking: Reported on 05/09/2017) 30 tablet 0  . promethazine (PHENERGAN) 12.5 MG tablet Take 1 tablet (12.5 mg total) by mouth every 6 (six) hours as needed for nausea or vomiting. 30 tablet 0  . promethazine (PHENERGAN) 25 MG tablet TAKE 1 TABLET BY MOUTH EVERY 6 HOURS AS NEEDED FOR NAUSEA/VOMITING 30 tablet 0  . sucralfate (CARAFATE) 1 g tablet Take 1 tablet (1 g total) by mouth 2 (two) times daily. (Patient not taking: Reported on 05/09/2017) 60 tablet 0   No current facility-administered medications for this visit.     SURGICAL HISTORY:  Past Surgical History:  Procedure Laterality Date  . ANKLE SURGERY     x2  . APPENDECTOMY    . CARPAL TUNNEL RELEASE    . CHOLECYSTECTOMY    . IR FLUORO GUIDE PORT INSERTION RIGHT  03/19/2017  . IR US GUIDE VASC ACCESS RIGHT  03/19/2017  . OVARIAN CYST REMOVAL    . ROTATOR CUFF REPAIR    . TUBAL LIGATION    . VIDEO BRONCHOSCOPY WITH ENDOBRONCHIAL ULTRASOUND N/A 09/14/2016   Procedure: VIDEO BRONCHOSCOPY WITH ENDOBRONCHIAL ULTRASOUND;  Surgeon: Melrose Nakayama, MD;  Location: [facility] OR;  Service: Thoracic;  Laterality: N/A;    REVIEW OF SYSTEMS:  A comprehensive review of systems was negative except for: Respiratory: positive for cough and dyspnea on exertion Neurological: positive for tremors   PHYSICAL EXAMINATION: General appearance: alert, cooperative, fatigued and no distress Head: Normocephalic, without obvious abnormality, atraumatic Neck: no adenopathy, no JVD, supple, symmetrical, trachea midline and thyroid not enlarged, symmetric, no tenderness/mass/nodules Lymph nodes: Cervical, supraclavicular, and axillary nodes normal. Resp: diminished breath sounds LLL and wheezes LLL Back: symmetric, no curvature. ROM normal. No CVA tenderness. Cardio: regular rate and rhythm, S1, S2 normal,  no murmur, click, rub or gallop GI: soft, non-tender; bowel sounds normal; no masses,  no organomegaly Extremities: extremities normal, atraumatic, no cyanosis or edema  ECOG PERFORMANCE STATUS: 1 - Symptomatic but completely ambulatory  Blood pressure (!) 107/56, pulse 83, temperature 98.5 F (36.9 C), temperature source Oral, resp. rate 17, height 5\' 7"  (1.702 m), weight 242 lb 12.8 oz (110.1 kg), SpO2 94 %.  LABORATORY DATA: Lab Results  Component Value Date   WBC 5.8 05/09/2017   HGB 12.9 05/09/2017   HCT 39.0 05/09/2017   MCV 95.0 05/09/2017   PLT 186 05/09/2017      Chemistry      Component Value Date/Time   NA 140 05/09/2017 0916   NA 142 04/26/2017 1106   K 3.8 05/09/2017 0916   K 3.4 (L) 04/26/2017 1106   CL 103 05/09/2017 0916   CO2 28 05/09/2017 0916   CO2 29 04/26/2017 1106   BUN 9 05/09/2017  0916   BUN 7.5 04/26/2017 1106   CREATININE 0.96 05/09/2017 0916   CREATININE 0.9 04/26/2017 1106      Component Value Date/Time   CALCIUM 9.3 05/09/2017 0916   CALCIUM 9.1 04/26/2017 1106   ALKPHOS 93 05/09/2017 0916   ALKPHOS 99 04/26/2017 1106   AST 16 05/09/2017 0916   AST 14 04/26/2017 1106   ALT 16 05/09/2017 0916   ALT 12 04/26/2017 1106   BILITOT 0.3 05/09/2017 0916   BILITOT 0.28 04/26/2017 1106       RADIOGRAPHIC STUDIES: No results found.  ASSESSMENT AND PLAN:  This is a very pleasant 67 years old with unresectable a stage IIIa non-small cell lung cancer. The patient underwent a course of concurrent chemoradiation with weekly carboplatin and paclitaxel status post 8 cycles and has been tolerating this treatment fairly well except for mild fatigue and odynophagia. The patient is currently undergoing consolidation immunotherapy with Imfinzi (Durvalumab) status post 5 cycles and has been tolerating this treatment fairly well with no concerning complaints. I recommended for her to proceed with cycle #6 today as a scheduled. We will see the patient back  for follow-up visit in 2 weeks for evaluation after repeating CT scan of the chest for restaging of her disease. The patient was advised to call immediately if she has any concerning symptoms in the interval. The patient voices understanding of current disease status and treatment options and is in agreement with the current care plan. All questions were answered. The patient knows to call the clinic with any problems, questions or concerns. We can certainly see the patient much sooner if necessary.  Disclaimer: This note was dictated with voice recognition software. Similar sounding words can inadvertently be transcribed and may not be corrected upon review.

## 2017-05-09 NOTE — Patient Instructions (Signed)
[facility] Discharge Instructions for Patients Receiving Chemotherapy  Today you received the following chemotherapy agents: Imfinzi.  To help prevent nausea and vomiting after your treatment, we encourage you to take your nausea medication as directed.   If you develop nausea and vomiting that is not controlled by your nausea medication, call the clinic.   BELOW ARE SYMPTOMS THAT SHOULD BE REPORTED IMMEDIATELY:  *FEVER GREATER THAN 100.5 F  *CHILLS WITH OR WITHOUT FEVER  NAUSEA AND VOMITING THAT IS NOT CONTROLLED WITH YOUR NAUSEA MEDICATION  *UNUSUAL SHORTNESS OF BREATH  *UNUSUAL BRUISING OR BLEEDING  TENDERNESS IN MOUTH AND THROAT WITH OR WITHOUT PRESENCE OF ULCERS  *URINARY PROBLEMS  *BOWEL PROBLEMS  UNUSUAL RASH Items with * indicate a potential emergency and should be followed up as soon as possible.  Feel free to call the clinic should you have any questions or concerns. The clinic phone number is [phone].  Please show the CHEMO ALERT CARD at check-in to the Emergency Department and triage nurse.   

## 2017-05-11 ENCOUNTER — Telehealth: Payer: Self-pay | Admitting: Internal Medicine

## 2017-05-11 NOTE — Telephone Encounter (Signed)
Scheduled appt per 1/09 los - patient to get an updated schedule next visit.

## 2017-05-19 ENCOUNTER — Other Ambulatory Visit: Payer: Self-pay | Admitting: Cardiology

## 2017-05-20 ENCOUNTER — Other Ambulatory Visit: Payer: Self-pay | Admitting: Family Medicine

## 2017-05-21 ENCOUNTER — Ambulatory Visit (HOSPITAL_COMMUNITY)
Admission: RE | Admit: 2017-05-21 | Discharge: 2017-05-21 | Disposition: A | Discharge status: Home / Self Care | Payer: Medicare Other | Source: Ambulatory Visit | Attending: Internal Medicine | Admitting: Internal Medicine

## 2017-05-21 DIAGNOSIS — J439 Emphysema, unspecified: Secondary | ICD-10-CM | POA: Diagnosis not present

## 2017-05-21 DIAGNOSIS — C3490 Malignant neoplasm of unspecified part of unspecified bronchus or lung: Secondary | ICD-10-CM | POA: Diagnosis not present

## 2017-05-21 DIAGNOSIS — I700 Atherosclerosis of aorta: Secondary | ICD-10-CM | POA: Diagnosis not present

## 2017-05-21 DIAGNOSIS — I313 Pericardial effusion (noninflammatory): Secondary | ICD-10-CM | POA: Diagnosis not present

## 2017-05-21 DIAGNOSIS — J90 Pleural effusion, not elsewhere classified: Secondary | ICD-10-CM | POA: Diagnosis not present

## 2017-05-21 DIAGNOSIS — D3501 Benign neoplasm of right adrenal gland: Secondary | ICD-10-CM | POA: Diagnosis not present

## 2017-05-21 MED ORDER — IOPAMIDOL (ISOVUE-300) INJECTION 61%
75.0000 mL | Freq: Once | INTRAVENOUS | Status: AC | PRN
  Administered 2017-05-21: 75 mL via INTRAVENOUS

## 2017-05-22 ENCOUNTER — Ambulatory Visit (INDEPENDENT_AMBULATORY_CARE_PROVIDER_SITE_OTHER): Payer: Medicare Other

## 2017-05-22 ENCOUNTER — Telehealth (INDEPENDENT_AMBULATORY_CARE_PROVIDER_SITE_OTHER): Payer: Medicare Other

## 2017-05-22 DIAGNOSIS — Z7901 Long term (current) use of anticoagulants: Secondary | ICD-10-CM | POA: Diagnosis not present

## 2017-05-22 DIAGNOSIS — R300 Dysuria: Secondary | ICD-10-CM

## 2017-05-22 LAB — POCT URINALYSIS DIPSTICK: PH UA: 5 (ref 5.0–8.0)

## 2017-05-22 LAB — POCT INR: INR: 1.7

## 2017-05-22 MED ORDER — NITROFURANTOIN MONOHYD MACRO 100 MG PO CAPS: 100.0000 mg | ORAL_CAPSULE | Freq: Two times a day (BID) | ORAL | 0 refills | Status: AC

## 2017-05-22 NOTE — Addendum Note (Signed)
Addended by: Karle Barr on: 05/22/2017 04:49 PM   Modules accepted: Orders

## 2017-05-22 NOTE — Telephone Encounter (Signed)
macrobid 100 bid 7 d , culture urine plz

## 2017-05-22 NOTE — Telephone Encounter (Signed)
Patient is aware 

## 2017-05-22 NOTE — Telephone Encounter (Signed)
Dr.Steve aware not enough urine to culture.

## 2017-05-22 NOTE — Telephone Encounter (Signed)
Patient came in today for Pt/inr check and states she has some burning and stinging while urinating she has not ran a fever.Urine dipped and spun.

## 2017-05-23 ENCOUNTER — Other Ambulatory Visit: Payer: Self-pay | Admitting: Internal Medicine

## 2017-05-23 ENCOUNTER — Inpatient Hospital Stay: Payer: Medicare Other

## 2017-05-23 ENCOUNTER — Telehealth: Payer: Self-pay | Admitting: Internal Medicine

## 2017-05-23 ENCOUNTER — Inpatient Hospital Stay (HOSPITAL_BASED_OUTPATIENT_CLINIC_OR_DEPARTMENT_OTHER): Payer: Medicare Other | Admitting: Internal Medicine

## 2017-05-23 ENCOUNTER — Encounter: Payer: Self-pay | Admitting: Internal Medicine

## 2017-05-23 VITALS — BP 106/50 | HR 73 | Temp 98.0°F | Resp 18 | Ht 67.0 in | Wt 241.3 lb

## 2017-05-23 DIAGNOSIS — G8929 Other chronic pain: Secondary | ICD-10-CM

## 2017-05-23 DIAGNOSIS — N281 Cyst of kidney, acquired: Secondary | ICD-10-CM

## 2017-05-23 DIAGNOSIS — J90 Pleural effusion, not elsewhere classified: Secondary | ICD-10-CM | POA: Diagnosis not present

## 2017-05-23 DIAGNOSIS — Z86718 Personal history of other venous thrombosis and embolism: Secondary | ICD-10-CM

## 2017-05-23 DIAGNOSIS — D3501 Benign neoplasm of right adrenal gland: Secondary | ICD-10-CM

## 2017-05-23 DIAGNOSIS — M459 Ankylosing spondylitis of unspecified sites in spine: Secondary | ICD-10-CM

## 2017-05-23 DIAGNOSIS — I313 Pericardial effusion (noninflammatory): Secondary | ICD-10-CM | POA: Diagnosis not present

## 2017-05-23 DIAGNOSIS — J449 Chronic obstructive pulmonary disease, unspecified: Secondary | ICD-10-CM | POA: Diagnosis not present

## 2017-05-23 DIAGNOSIS — C3432 Malignant neoplasm of lower lobe, left bronchus or lung: Secondary | ICD-10-CM

## 2017-05-23 DIAGNOSIS — F419 Anxiety disorder, unspecified: Secondary | ICD-10-CM

## 2017-05-23 DIAGNOSIS — Z9981 Dependence on supplemental oxygen: Secondary | ICD-10-CM

## 2017-05-23 DIAGNOSIS — I480 Paroxysmal atrial fibrillation: Secondary | ICD-10-CM

## 2017-05-23 DIAGNOSIS — R599 Enlarged lymph nodes, unspecified: Secondary | ICD-10-CM | POA: Diagnosis not present

## 2017-05-23 DIAGNOSIS — Z95828 Presence of other vascular implants and grafts: Secondary | ICD-10-CM

## 2017-05-23 DIAGNOSIS — G4730 Sleep apnea, unspecified: Secondary | ICD-10-CM

## 2017-05-23 DIAGNOSIS — R251 Tremor, unspecified: Secondary | ICD-10-CM

## 2017-05-23 DIAGNOSIS — E669 Obesity, unspecified: Secondary | ICD-10-CM

## 2017-05-23 DIAGNOSIS — R0602 Shortness of breath: Secondary | ICD-10-CM

## 2017-05-23 DIAGNOSIS — I700 Atherosclerosis of aorta: Secondary | ICD-10-CM | POA: Diagnosis not present

## 2017-05-23 DIAGNOSIS — Z9049 Acquired absence of other specified parts of digestive tract: Secondary | ICD-10-CM

## 2017-05-23 DIAGNOSIS — K219 Gastro-esophageal reflux disease without esophagitis: Secondary | ICD-10-CM

## 2017-05-23 DIAGNOSIS — R05 Cough: Secondary | ICD-10-CM | POA: Diagnosis not present

## 2017-05-23 DIAGNOSIS — M129 Arthropathy, unspecified: Secondary | ICD-10-CM

## 2017-05-23 DIAGNOSIS — Z923 Personal history of irradiation: Secondary | ICD-10-CM

## 2017-05-23 DIAGNOSIS — Z5112 Encounter for antineoplastic immunotherapy: Secondary | ICD-10-CM

## 2017-05-23 DIAGNOSIS — C3492 Malignant neoplasm of unspecified part of left bronchus or lung: Secondary | ICD-10-CM

## 2017-05-23 DIAGNOSIS — Z79899 Other long term (current) drug therapy: Secondary | ICD-10-CM

## 2017-05-23 DIAGNOSIS — Z7901 Long term (current) use of anticoagulants: Secondary | ICD-10-CM

## 2017-05-23 DIAGNOSIS — I878 Other specified disorders of veins: Secondary | ICD-10-CM

## 2017-05-23 DIAGNOSIS — M4800 Spinal stenosis, site unspecified: Secondary | ICD-10-CM

## 2017-05-23 DIAGNOSIS — Z86711 Personal history of pulmonary embolism: Secondary | ICD-10-CM

## 2017-05-23 DIAGNOSIS — G4733 Obstructive sleep apnea (adult) (pediatric): Secondary | ICD-10-CM

## 2017-05-23 LAB — CBC WITH DIFFERENTIAL/PLATELET
Basophils Absolute: 0 10*3/uL (ref 0.0–0.1)
Basophils Relative: 0 %
EOS ABS: 0.1 10*3/uL (ref 0.0–0.5)
Eosinophils Relative: 1 %
HEMATOCRIT: 40.3 % (ref 34.8–46.6)
HEMOGLOBIN: 12.6 g/dL (ref 11.6–15.9)
LYMPHS PCT: 4 %
Lymphs Abs: 0.4 10*3/uL — ABNORMAL LOW (ref 0.9–3.3)
MCH: 30.5 pg (ref 25.1–34.0)
MCHC: 31.3 g/dL — AB (ref 31.5–36.0)
MCV: 97.6 fL (ref 79.5–101.0)
MONOS PCT: 7 %
Monocytes Absolute: 0.6 10*3/uL (ref 0.1–0.9)
NEUTROS ABS: 7.9 10*3/uL — AB (ref 1.5–6.5)
NEUTROS PCT: 88 %
Platelets: 188 10*3/uL (ref 145–400)
RBC: 4.13 MIL/uL (ref 3.70–5.45)
RDW: 13.9 % (ref 11.2–16.1)
WBC: 9 10*3/uL (ref 3.9–10.3)

## 2017-05-23 LAB — COMPREHENSIVE METABOLIC PANEL
ALT: 16 U/L (ref 0–55)
AST: 15 U/L (ref 5–34)
Albumin: 3.5 g/dL (ref 3.5–5.0)
Alkaline Phosphatase: 93 U/L (ref 40–150)
Anion gap: 9 (ref 3–11)
BUN: 8 mg/dL (ref 7–26)
CHLORIDE: 104 mmol/L (ref 98–109)
CO2: 29 mmol/L (ref 22–29)
CREATININE: 0.92 mg/dL (ref 0.60–1.10)
Calcium: 9.2 mg/dL (ref 8.4–10.4)
GFR calc non Af Amer: >60 mL/min (ref >60)
Glucose, Bld: 110 mg/dL (ref 70–140)
POTASSIUM: 3.7 mmol/L (ref 3.3–4.7)
SODIUM: 142 mmol/L (ref 136–145)
Total Bilirubin: 0.4 mg/dL (ref 0.2–1.2)
Total Protein: 6.6 g/dL (ref 6.4–8.3)

## 2017-05-23 MED ORDER — SODIUM CHLORIDE 0.9 % IV SOLN
Freq: Once | INTRAVENOUS | Status: AC
  Administered 2017-05-23: 11:00:00 via INTRAVENOUS

## 2017-05-23 MED ORDER — SODIUM CHLORIDE 0.9% FLUSH
10.0000 mL | INTRAVENOUS | Status: DC | PRN
  Administered 2017-05-23: 10 mL
  Filled 2017-05-23: qty 10

## 2017-05-23 MED ORDER — HEPARIN SOD (PORK) LOCK FLUSH 100 UNIT/ML IV SOLN
500.0000 [IU] | Freq: Once | INTRAVENOUS | Status: AC | PRN
  Administered 2017-05-23: 500 [IU]
  Filled 2017-05-23: qty 5

## 2017-05-23 MED ORDER — SODIUM CHLORIDE 0.9% FLUSH
10.0000 mL | INTRAVENOUS | Status: DC | PRN
  Administered 2017-05-23: 10 mL via INTRAVENOUS
  Filled 2017-05-23: qty 10

## 2017-05-23 MED ORDER — SODIUM CHLORIDE 0.9 % IV SOLN
10.2000 mg/kg | Freq: Once | INTRAVENOUS | Status: AC
  Administered 2017-05-23: 1120 mg via INTRAVENOUS
  Filled 2017-05-23: qty 20

## 2017-05-23 NOTE — Patient Instructions (Signed)
[facility] Discharge Instructions for Patients Receiving Chemotherapy  Today you received the following chemotherapy agents: Darvalumab (Imfinzi).  To help prevent nausea and vomiting after your treatment, we encourage you to take your nausea medication as prescribed.  If you develop nausea and vomiting that is not controlled by your nausea medication, call the clinic.   BELOW ARE SYMPTOMS THAT SHOULD BE REPORTED IMMEDIATELY:  *FEVER GREATER THAN 100.5 F  *CHILLS WITH OR WITHOUT FEVER  NAUSEA AND VOMITING THAT IS NOT CONTROLLED WITH YOUR NAUSEA MEDICATION  *UNUSUAL SHORTNESS OF BREATH  *UNUSUAL BRUISING OR BLEEDING  TENDERNESS IN MOUTH AND THROAT WITH OR WITHOUT PRESENCE OF ULCERS  *URINARY PROBLEMS  *BOWEL PROBLEMS  UNUSUAL RASH Items with * indicate a potential emergency and should be followed up as soon as possible.  Feel free to call the clinic should you have any questions or concerns. The clinic phone number is [phone].  Please show the [facility] at check-in to the Emergency Department and triage nurse.

## 2017-05-23 NOTE — Patient Instructions (Signed)
Implanted Port Home Guide An implanted port is a type of central line that is placed under the skin. Central lines are used to provide IV access when treatment or nutrition needs to be given through a person's veins. Implanted ports are used for long-term IV access. An implanted port may be placed because:  You need IV medicine that would be irritating to the small veins in your hands or arms.  You need long-term IV medicines, such as antibiotics.  You need IV nutrition for a long period.  You need frequent blood draws for lab tests.  You need dialysis.  Implanted ports are usually placed in the chest area, but they can also be placed in the upper arm, the abdomen, or the leg. An implanted port has two main parts:  Reservoir. The reservoir is round and will appear as a small, raised area under your skin. The reservoir is the part where a needle is inserted to give medicines or draw blood.  Catheter. The catheter is a thin, flexible tube that extends from the reservoir. The catheter is placed into a large vein. Medicine that is inserted into the reservoir goes into the catheter and then into the vein.  How will I care for my incision site? Do not get the incision site wet. Bathe or shower as directed by your health care provider. How is my port accessed? Special steps must be taken to access the port:  Before the port is accessed, a numbing cream can be placed on the skin. This helps numb the skin over the port site.  Your health care provider uses a sterile technique to access the port. ? Your health care provider must put on a mask and sterile gloves. ? The skin over your port is cleaned carefully with an antiseptic and allowed to dry. ? The port is gently pinched between sterile gloves, and a needle is inserted into the port.  Only "non-coring" port needles should be used to access the port. Once the port is accessed, a blood return should be checked. This helps ensure that the port  is in the vein and is not clogged.  If your port needs to remain accessed for a constant infusion, a clear (transparent) bandage will be placed over the needle site. The bandage and needle will need to be changed every week, or as directed by your health care provider.  Keep the bandage covering the needle clean and dry. Do not get it wet. Follow your health care provider's instructions on how to take a shower or bath while the port is accessed.  If your port does not need to stay accessed, no bandage is needed over the port.  What is flushing? Flushing helps keep the port from getting clogged. Follow your health care provider's instructions on how and when to flush the port. Ports are usually flushed with saline solution or a medicine called heparin. The need for flushing will depend on how the port is used.  If the port is used for intermittent medicines or blood draws, the port will need to be flushed: ? After medicines have been given. ? After blood has been drawn. ? As part of routine maintenance.  If a constant infusion is running, the port may not need to be flushed.  How long will my port stay implanted? The port can stay in for as long as your health care provider thinks it is needed. When it is time for the port to come out, surgery will be   done to remove it. The procedure is similar to the one performed when the port was put in. When should I seek immediate medical care? When you have an implanted port, you should seek immediate medical care if:  You notice a bad smell coming from the incision site.  You have swelling, redness, or drainage at the incision site.  You have more swelling or pain at the port site or the surrounding area.  You have a fever that is not controlled with medicine.  This information is not intended to replace advice given to you by your health care provider. Make sure you discuss any questions you have with your health care provider. Document  Released: 04/17/2005 Document Revised: 09/23/2015 Document Reviewed: 12/23/2012 Elsevier Interactive Patient Education  2017 [employer]  

## 2017-05-23 NOTE — Patient Instructions (Signed)
[facility] Discharge Instructions for Patients Receiving Chemotherapy  Today you received the following chemotherapy agents: Imfinzi.  To help prevent nausea and vomiting after your treatment, we encourage you to take your nausea medication as directed.   If you develop nausea and vomiting that is not controlled by your nausea medication, call the clinic.   BELOW ARE SYMPTOMS THAT SHOULD BE REPORTED IMMEDIATELY:  *FEVER GREATER THAN 100.5 F  *CHILLS WITH OR WITHOUT FEVER  NAUSEA AND VOMITING THAT IS NOT CONTROLLED WITH YOUR NAUSEA MEDICATION  *UNUSUAL SHORTNESS OF BREATH  *UNUSUAL BRUISING OR BLEEDING  TENDERNESS IN MOUTH AND THROAT WITH OR WITHOUT PRESENCE OF ULCERS  *URINARY PROBLEMS  *BOWEL PROBLEMS  UNUSUAL RASH Items with * indicate a potential emergency and should be followed up as soon as possible.  Feel free to call the clinic should you have any questions or concerns. The clinic phone number is [phone].  Please show the CHEMO ALERT CARD at check-in to the Emergency Department and triage nurse.   

## 2017-05-23 NOTE — Progress Notes (Signed)
[facility] [phone]   [phone]) [phone]  OFFICE PROGRESS NOTE  [name], MD [address] [place] [address]  DIAGNOSIS: Unresectable stage IIIA (T4, N0, M0) non-small cell lung cancer, squamous cell carcinoma diagnosed in May 2018 and presented with large obstructing left lower lobe lung mass suspicious small mediastinal lymphadenopathy.  PRIOR THERAPY: A course of concurrent chemoradiation with weekly carboplatin for AUC of 2 and paclitaxel 45 MG/M2. Status post 5 cycles. She received her radiation in [facility]. Last dose of chemotherapy was given 11/27/2016.Marland Kitchen  CURRENT THERAPY: consolidation immunotherapy with Imfinzi (Durvalumab) 10 MG/KG every 2 weeks, first dose 02/21/2017.  Status post 6 cycles.  INTERVAL HISTORY: Sarah [name] 67 y.o. female returns to the clinic today for follow-up visit accompanied by her husband.  The patient is feeling fine today with no specific complaints except for the baseline shortness of breath and she is currently on home oxygen secondary to COPD.  She denied having any chest pain, cough or hemoptysis.  She denied having any fever or chills.  She has no significant weight loss or night sweats.  The patient has no nausea, vomiting, diarrhea or constipation.  She continues to tolerate her treatment with Imfinzi (Durvalumab) fairly well.  She had repeat CT scan of the chest performed recently and she is here for evaluation and discussion of her scan results.  MEDICAL HISTORY: Past Medical History:  Diagnosis Date  . Arthritis   . Cancer ([facility])    lung cancer  . Chronic anxiety    Patient describes as stress  . Chronic back pain   . DVT (deep venous thrombosis) ([facility]) age 63  . GERD (gastroesophageal reflux disease)   . Goals of care, counseling/discussion 09/21/2016  . Headache(784.0)   . Hypoestrogenism   . Lower extremity venous stasis    LLE, chronic  . Obesity   . Odynophagia  11/13/2016  . Paroxysmal atrial fibrillation ([facility])   . PONV (postoperative nausea and vomiting)   . Pulmonary embolism ([facility])   . Reflux   . Sleep apnea    wear CPAP  . Spinal stenosis   . Tobacco abuse   . Venous stasis   . Warfarin anticoagulation     ALLERGIES:  is allergic to aspirin.  MEDICATIONS:  Current Outpatient Medications  Medication Sig Dispense Refill  . albuterol (PROVENTIL HFA;VENTOLIN HFA) 108 (90 Base) MCG/ACT inhaler Inhale 2 puffs into the lungs every 6 (six) hours as needed for wheezing or shortness of breath. 1 Inhaler 5  . budesonide-formoterol (SYMBICORT) 160-4.5 MCG/ACT inhaler Inhale 2 puffs into the lungs 2 (two) times daily. 1 Inhaler 12  . buPROPion (WELLBUTRIN SR) 150 MG 12 hr tablet TAKE 1 TABLET BY MOUTH TWICE DAILY **KEEP APPT ON 06/09/16** (Patient taking differently: Take 150 mg by mouth daily. TAKE 1 TABLET BY MOUTH TWICE DAILY **KEEP APPT ON 06/09/16**) 180 tablet 0  . calcium carbonate (TUMS - DOSED IN MG ELEMENTAL CALCIUM) 500 MG chewable tablet Chew 1 tablet by mouth 2 (two) times a week.     . diltiazem (CARDIZEM CD) 180 MG 24 hr capsule TAKE ONE CAPSULE BY MOUTH EVERY DAY 90 capsule 1  . flecainide (TAMBOCOR) 50 MG tablet TAKE 1 TABLET (50 MG TOTAL) BY MOUTH 2 (TWO) TIMES DAILY. 60 tablet 2  . HYDROcodone-acetaminophen (NORCO) 7.5-325 MG tablet One tablet up to three times a day as needed 90 tablet 0  . ketoconazole (NIZORAL) 2 %  cream Apply BID for rash 30 g 2  . lidocaine-prilocaine (EMLA) cream Apply 1 application as needed topically. 30 g 2  . metoprolol tartrate (LOPRESSOR) 25 MG tablet TAKE 1 TABLET AS NEEDED FOR HEART RACING 10 tablet 5  . nitrofurantoin, macrocrystal-monohydrate, (MACROBID) 100 MG capsule Take 1 capsule (100 mg total) by mouth 2 (two) times daily for 7 days. 14 capsule 0  . pantoprazole (PROTONIX) 40 MG tablet TAKE 1 TABLET EVERY MORNING 90 tablet 0  . warfarin (COUMADIN) 5 MG tablet Take 0-2.5 mg daily by mouth. Take 2.5  mg Daily except none on Monday and Thursday.    . prochlorperazine (COMPAZINE) 10 MG tablet Take 1 tablet (10 mg total) by mouth every 6 (six) hours as needed for nausea or vomiting. (Patient not taking: Reported on 05/09/2017) 30 tablet 0  . promethazine (PHENERGAN) 25 MG tablet TAKE 1 TABLET BY MOUTH EVERY 6 HOURS AS NEEDED FOR NAUSEA/VOMITING (Patient not taking: Reported on 05/23/2017) 30 tablet 0  . sucralfate (CARAFATE) 1 g tablet Take 1 tablet (1 g total) by mouth 2 (two) times daily. (Patient not taking: Reported on 05/09/2017) 60 tablet 0   No current facility-administered medications for this visit.     SURGICAL HISTORY:  Past Surgical History:  Procedure Laterality Date  . ANKLE SURGERY     x2  . APPENDECTOMY    . CARPAL TUNNEL RELEASE    . CHOLECYSTECTOMY    . IR FLUORO GUIDE PORT INSERTION RIGHT  03/19/2017  . IR US GUIDE VASC ACCESS RIGHT  03/19/2017  . OVARIAN CYST REMOVAL    . ROTATOR CUFF REPAIR    . TUBAL LIGATION    . VIDEO BRONCHOSCOPY WITH ENDOBRONCHIAL ULTRASOUND N/A 09/14/2016   Procedure: VIDEO BRONCHOSCOPY WITH ENDOBRONCHIAL ULTRASOUND;  Surgeon: Melrose Nakayama, MD;  Location: [facility];  Service: Thoracic;  Laterality: N/A;    REVIEW OF SYSTEMS:  Constitutional: positive for fatigue Eyes: negative Ears, nose, mouth, throat, and face: negative Respiratory: positive for dyspnea on exertion Cardiovascular: negative Gastrointestinal: negative Genitourinary:negative Integument/breast: negative Hematologic/lymphatic: negative Musculoskeletal:negative Neurological: negative Behavioral/Psych: negative Endocrine: negative Allergic/Immunologic: negative   PHYSICAL EXAMINATION: General appearance: alert, cooperative, fatigued and no distress Head: Normocephalic, without obvious abnormality, atraumatic Neck: no adenopathy, no JVD, supple, symmetrical, trachea midline and thyroid not enlarged, symmetric, no tenderness/mass/nodules Lymph nodes: Cervical,  supraclavicular, and axillary nodes normal. Resp: clear to auscultation bilaterally Back: symmetric, no curvature. ROM normal. No CVA tenderness. Cardio: regular rate and rhythm, S1, S2 normal, no murmur, click, rub or gallop GI: soft, non-tender; bowel sounds normal; no masses,  no organomegaly Extremities: extremities normal, atraumatic, no cyanosis or edema Neurologic: Alert and oriented X 3, normal strength and tone. Normal symmetric reflexes. Normal coordination and gait  ECOG PERFORMANCE STATUS: 1 - Symptomatic but completely ambulatory  Blood pressure (!) 106/50, pulse 73, temperature 98 F (36.7 C), temperature source Oral, resp. rate 18, height 5\' 7"  (1.702 m), weight 241 lb 4.8 oz (109.5 kg), SpO2 98 %.  LABORATORY DATA: Lab Results  Component Value Date   WBC 5.8 05/09/2017   HGB 12.9 05/09/2017   HCT 39.0 05/09/2017   MCV 95.0 05/09/2017   PLT 186 05/09/2017      Chemistry      Component Value Date/Time   NA 140 05/09/2017 0916   NA 142 04/26/2017 1106   K 3.8 05/09/2017 0916   K 3.4 (L) 04/26/2017 1106   CL 103 05/09/2017 0916   CO2 28 05/09/2017 0916  CO2 29 04/26/2017 1106   BUN 9 05/09/2017 0916   BUN 7.5 04/26/2017 1106   CREATININE 0.96 05/09/2017 0916   CREATININE 0.9 04/26/2017 1106      Component Value Date/Time   CALCIUM 9.3 05/09/2017 0916   CALCIUM 9.1 04/26/2017 1106   ALKPHOS 93 05/09/2017 0916   ALKPHOS 99 04/26/2017 1106   AST 16 05/09/2017 0916   AST 14 04/26/2017 1106   ALT 16 05/09/2017 0916   ALT 12 04/26/2017 1106   BILITOT 0.3 05/09/2017 0916   BILITOT 0.28 04/26/2017 1106       RADIOGRAPHIC STUDIES: Ct Chest W Contrast  Result Date: 05/21/2017 CLINICAL DATA:  Unresectable stage IIIA left lower lobe squamous cell lung carcinoma diagnosed May 2018 status post concurrent chemo radiation therapy with ongoing consolidation immunotherapy. Patient presents for restaging. EXAM: CT CHEST WITH CONTRAST TECHNIQUE: Multidetector CT  imaging of the chest was performed during intravenous contrast administration. CONTRAST:  36mL ISOVUE-300 IOPAMIDOL (ISOVUE-300) INJECTION 61% COMPARISON:  12/25/2016 chest CT. FINDINGS: Cardiovascular: Normal heart size. New small pericardial effusion/thickening. Atherosclerotic nonaneurysmal thoracic aorta. Stable dilated main pulmonary artery (3.5 cm diameter). Right internal jugular MediPort terminates in middle third of the superior vena cava. No central pulmonary emboli. Mediastinum/Nodes: No discrete thyroid nodules. Unremarkable esophagus. No pathologically enlarged axillary, mediastinal or hilar lymph nodes. Lungs/Pleura: No pneumothorax. Moderate centrilobular emphysema with mild diffuse bronchial wall thickening. Small left pleural effusion is mildly increased. There is worsened significant volume loss in the left hemithorax. Central left lower lobe 6.5 x 3.7 cm lung mass (series 2/image 66), previously 6.9 x 4.4 cm on 12/25/2016 chest CT, mildly decreased. There is new mild aeration in left lower lobe. Patchy left upper lobe parahilar consolidation and ground-glass attenuation with associated volume loss, most compatible with evolving postradiation change. New 3 mm basilar right lower lobe pulmonary nodule (series 4/image 109). New irregular vaguely nodular bandlike opacity in peripheral right lower lobe (series 4/image 103). No additional significant pulmonary nodules. Upper abdomen: Cholecystectomy. Simple 2.1 cm upper right renal cyst. Stable 2.9 cm right adrenal adenoma. Musculoskeletal: Increased small focus of sclerosis in the posteromedial left seventh rib. No new focal osseous lesions. Moderate thoracic spondylosis. IMPRESSION: 1. Central left lower lobe lung mass is mildly decreased. Mildly improved aeration in the left lower lobe. 2. Increased small focus of sclerosis in the posteromedial left seventh rib adjacent to the central left lower lobe lung mass, favor evolving post treatment change at  a tiny site of direct rib invasion by the tumor. 3. Evolving postradiation change in the parahilar left upper lung. 4. No definite findings of metastatic disease in the chest. New vaguely nodular bandlike opacity in the peripheral right lower lobe. New tiny 3 mm right lower lobe pulmonary nodule. These findings are nonspecific and warrant attention on follow-up chest CT in 3 months. 5. New small pericardial effusion/thickening. Small left pleural effusion is mildly increased. 6. Stable dilated main pulmonary artery, suggesting chronic pulmonary arterial hypertension. 7. Stable right adrenal adenoma. Aortic Atherosclerosis (ICD10-I70.0) and Emphysema (ICD10-J43.9). Electronically Signed   By: Ilona Sorrel M.D.   On: 05/21/2017 14:26    ASSESSMENT AND PLAN:  This is a very pleasant 67 years old with unresectable a stage IIIa non-small cell lung cancer. The patient underwent a course of concurrent chemoradiation with weekly carboplatin and paclitaxel status post 8 cycles and has been tolerating this treatment fairly well except for mild fatigue and odynophagia. The patient is currently undergoing consolidation immunotherapy with Imfinzi (Durvalumab)  status post 6 cycles. The patient is tolerating this treatment fairly well with no concerning complaints. She had repeat CT scan of the chest performed recently.  I personally and independently reviewed the scans and discussed the results with the patient and her husband today. Her scan showed no concerning findings for disease progression. I recommended for the patient to continue her current treatment with Imfinzi (Durvalumab) and she will proceed with cycle #7 today. For COPD, she would continue on home oxygen in addition to her current treatment with inhalers. She was advised to call immediately if she has any concerning symptoms in the interval. The patient voices understanding of current disease status and treatment options and is in agreement with the  current care plan. All questions were answered. The patient knows to call the clinic with any problems, questions or concerns. We can certainly see the patient much sooner if necessary.  Disclaimer: This note was dictated with voice recognition software. Similar sounding words can inadvertently be transcribed and may not be corrected upon review.

## 2017-05-23 NOTE — Telephone Encounter (Signed)
Patient already has appointments scheduled

## 2017-05-25 ENCOUNTER — Ambulatory Visit (HOSPITAL_COMMUNITY): Payer: Medicare Other

## 2017-05-31 ENCOUNTER — Telehealth: Payer: Self-pay | Admitting: Medical Oncology

## 2017-05-31 ENCOUNTER — Other Ambulatory Visit: Payer: Self-pay | Admitting: Medical Oncology

## 2017-05-31 DIAGNOSIS — R058 Other specified cough: Secondary | ICD-10-CM

## 2017-05-31 DIAGNOSIS — R05 Cough: Secondary | ICD-10-CM

## 2017-05-31 MED ORDER — AZITHROMYCIN 250 MG PO TABS: ORAL_TABLET | ORAL | 0 refills | Status: DC

## 2017-05-31 NOTE — Telephone Encounter (Signed)
Reports new cough with thick green sputum x4 days.Received Imfinzi on 1/23. I called pt. Denies fever, feels fine otherwise.

## 2017-05-31 NOTE — Telephone Encounter (Signed)
Per Sarah [name] she can see her family doctor or he will call in zpak. She said her family doctor is off today so she wants to start the zpak.

## 2017-06-06 ENCOUNTER — Encounter: Payer: Self-pay | Admitting: Oncology

## 2017-06-06 ENCOUNTER — Inpatient Hospital Stay: Payer: Medicare Other | Attending: Internal Medicine

## 2017-06-06 ENCOUNTER — Inpatient Hospital Stay (HOSPITAL_BASED_OUTPATIENT_CLINIC_OR_DEPARTMENT_OTHER): Payer: Medicare Other | Admitting: Oncology

## 2017-06-06 ENCOUNTER — Inpatient Hospital Stay: Payer: Medicare Other

## 2017-06-06 ENCOUNTER — Telehealth: Payer: Self-pay | Admitting: Internal Medicine

## 2017-06-06 VITALS — BP 115/42 | HR 73 | Temp 98.5°F | Resp 18 | Ht 67.0 in | Wt 239.9 lb

## 2017-06-06 DIAGNOSIS — J90 Pleural effusion, not elsewhere classified: Secondary | ICD-10-CM | POA: Insufficient documentation

## 2017-06-06 DIAGNOSIS — K219 Gastro-esophageal reflux disease without esophagitis: Secondary | ICD-10-CM | POA: Diagnosis not present

## 2017-06-06 DIAGNOSIS — I878 Other specified disorders of veins: Secondary | ICD-10-CM

## 2017-06-06 DIAGNOSIS — I480 Paroxysmal atrial fibrillation: Secondary | ICD-10-CM | POA: Diagnosis not present

## 2017-06-06 DIAGNOSIS — Z86718 Personal history of other venous thrombosis and embolism: Secondary | ICD-10-CM

## 2017-06-06 DIAGNOSIS — G4730 Sleep apnea, unspecified: Secondary | ICD-10-CM | POA: Insufficient documentation

## 2017-06-06 DIAGNOSIS — M549 Dorsalgia, unspecified: Secondary | ICD-10-CM

## 2017-06-06 DIAGNOSIS — F17210 Nicotine dependence, cigarettes, uncomplicated: Secondary | ICD-10-CM

## 2017-06-06 DIAGNOSIS — Z9221 Personal history of antineoplastic chemotherapy: Secondary | ICD-10-CM

## 2017-06-06 DIAGNOSIS — M129 Arthropathy, unspecified: Secondary | ICD-10-CM | POA: Diagnosis not present

## 2017-06-06 DIAGNOSIS — Z923 Personal history of irradiation: Secondary | ICD-10-CM

## 2017-06-06 DIAGNOSIS — C3492 Malignant neoplasm of unspecified part of left bronchus or lung: Secondary | ICD-10-CM

## 2017-06-06 DIAGNOSIS — R599 Enlarged lymph nodes, unspecified: Secondary | ICD-10-CM | POA: Insufficient documentation

## 2017-06-06 DIAGNOSIS — D3501 Benign neoplasm of right adrenal gland: Secondary | ICD-10-CM | POA: Diagnosis not present

## 2017-06-06 DIAGNOSIS — E669 Obesity, unspecified: Secondary | ICD-10-CM

## 2017-06-06 DIAGNOSIS — Z86711 Personal history of pulmonary embolism: Secondary | ICD-10-CM

## 2017-06-06 DIAGNOSIS — J449 Chronic obstructive pulmonary disease, unspecified: Secondary | ICD-10-CM

## 2017-06-06 DIAGNOSIS — Z7901 Long term (current) use of anticoagulants: Secondary | ICD-10-CM | POA: Insufficient documentation

## 2017-06-06 DIAGNOSIS — R5382 Chronic fatigue, unspecified: Secondary | ICD-10-CM

## 2017-06-06 DIAGNOSIS — F419 Anxiety disorder, unspecified: Secondary | ICD-10-CM | POA: Diagnosis not present

## 2017-06-06 DIAGNOSIS — Z9981 Dependence on supplemental oxygen: Secondary | ICD-10-CM | POA: Diagnosis not present

## 2017-06-06 DIAGNOSIS — I700 Atherosclerosis of aorta: Secondary | ICD-10-CM | POA: Insufficient documentation

## 2017-06-06 DIAGNOSIS — Z5112 Encounter for antineoplastic immunotherapy: Secondary | ICD-10-CM

## 2017-06-06 DIAGNOSIS — N281 Cyst of kidney, acquired: Secondary | ICD-10-CM | POA: Diagnosis not present

## 2017-06-06 DIAGNOSIS — Z79899 Other long term (current) drug therapy: Secondary | ICD-10-CM

## 2017-06-06 DIAGNOSIS — C3432 Malignant neoplasm of lower lobe, left bronchus or lung: Secondary | ICD-10-CM | POA: Diagnosis not present

## 2017-06-06 DIAGNOSIS — Z95828 Presence of other vascular implants and grafts: Secondary | ICD-10-CM

## 2017-06-06 DIAGNOSIS — I313 Pericardial effusion (noninflammatory): Secondary | ICD-10-CM

## 2017-06-06 LAB — CBC WITH DIFFERENTIAL/PLATELET
BASOS ABS: 0 10*3/uL (ref 0.0–0.1)
Basophils Relative: 1 %
EOS ABS: 0.1 10*3/uL (ref 0.0–0.5)
EOS PCT: 2 %
HCT: 39 % (ref 34.8–46.6)
Hemoglobin: 12.9 g/dL (ref 11.6–15.9)
LYMPHS PCT: 7 %
Lymphs Abs: 0.4 10*3/uL — ABNORMAL LOW (ref 0.9–3.3)
MCH: 30.6 pg (ref 25.1–34.0)
MCHC: 33 g/dL (ref 31.5–36.0)
MCV: 92.8 fL (ref 79.5–101.0)
Monocytes Absolute: 0.4 10*3/uL (ref 0.1–0.9)
Monocytes Relative: 9 %
Neutro Abs: 4.2 10*3/uL (ref 1.5–6.5)
Neutrophils Relative %: 81 %
PLATELETS: 207 10*3/uL (ref 145–400)
RBC: 4.2 MIL/uL (ref 3.70–5.45)
RDW: 14.4 % (ref 11.2–14.5)
WBC: 5.2 10*3/uL (ref 3.9–10.3)

## 2017-06-06 LAB — COMPREHENSIVE METABOLIC PANEL
ALT: 11 U/L (ref 0–55)
AST: 13 U/L (ref 5–34)
Albumin: 3.6 g/dL (ref 3.5–5.0)
Alkaline Phosphatase: 100 U/L (ref 40–150)
Anion gap: 10 (ref 3–11)
BILIRUBIN TOTAL: 0.3 mg/dL (ref 0.2–1.2)
BUN: 9 mg/dL (ref 7–26)
CO2: 29 mmol/L (ref 22–29)
Calcium: 9.2 mg/dL (ref 8.4–10.4)
Chloride: 103 mmol/L (ref 98–109)
Creatinine, Ser: 0.91 mg/dL (ref 0.60–1.10)
GFR calc Af Amer: >60 mL/min (ref >60)
Glucose, Bld: 92 mg/dL (ref 70–140)
POTASSIUM: 3.9 mmol/L (ref 3.5–5.1)
Sodium: 142 mmol/L (ref 136–145)
TOTAL PROTEIN: 6.8 g/dL (ref 6.4–8.3)

## 2017-06-06 LAB — TSH: TSH: 1.959 u[IU]/mL (ref 0.308–3.960)

## 2017-06-06 MED ORDER — SODIUM CHLORIDE 0.9% FLUSH
10.0000 mL | INTRAVENOUS | Status: DC | PRN
  Administered 2017-06-06: 10 mL via INTRAVENOUS
  Filled 2017-06-06: qty 10

## 2017-06-06 MED ORDER — HEPARIN SOD (PORK) LOCK FLUSH 100 UNIT/ML IV SOLN
500.0000 [IU] | Freq: Once | INTRAVENOUS | Status: AC | PRN
  Administered 2017-06-06: 500 [IU]
  Filled 2017-06-06: qty 5

## 2017-06-06 MED ORDER — SODIUM CHLORIDE 0.9 % IV SOLN
Freq: Once | INTRAVENOUS | Status: AC
  Administered 2017-06-06: 11:00:00 via INTRAVENOUS

## 2017-06-06 MED ORDER — SODIUM CHLORIDE 0.9% FLUSH
10.0000 mL | INTRAVENOUS | Status: DC | PRN
Start: 2017-06-06 — End: 2017-06-06
  Administered 2017-06-06: 10 mL
  Filled 2017-06-06: qty 10

## 2017-06-06 MED ORDER — DURVALUMAB 500 MG/10ML IV SOLN
10.1000 mg/kg | Freq: Once | INTRAVENOUS | Status: AC
  Administered 2017-06-06: 1120 mg via INTRAVENOUS
  Filled 2017-06-06: qty 20

## 2017-06-06 NOTE — Telephone Encounter (Signed)
Scheduled appt per 2/6 los - Patient to get an updated schedule next visit.

## 2017-06-06 NOTE — Assessment & Plan Note (Signed)
This is a very pleasant 67 year old with unresectable a stage IIIa non-small cell lung cancer. The patient underwent a course of concurrent chemoradiation with weekly carboplatin and paclitaxel status post 8 cycles and has been tolerating this treatment fairly well except for mild fatigue and odynophagia. The patient is currently undergoing consolidation immunotherapy with Imfinzi (Durvalumab) status post 7 cycles. The patient is tolerating this treatment fairly well with no concerning complaints. Recommend that she proceed with cycle 8 of her treatment as scheduled.  She will return in 2 weeks for evaluation prior to cycle #9.  For COPD, she would continue on home oxygen in addition to her current treatment with inhalers.  She was advised to call immediately if she has any concerning symptoms in the interval. The patient voices understanding of current disease status and treatment options and is in agreement with the current care plan. All questions were answered. The patient knows to call the clinic with any problems, questions or concerns. We can certainly see the patient much sooner if necessary.

## 2017-06-06 NOTE — Patient Instructions (Signed)
[facility] Discharge Instructions for Patients Receiving Chemotherapy  Today you received the following chemotherapy agents: Imfinzi.  To help prevent nausea and vomiting after your treatment, we encourage you to take your nausea medication as directed.   If you develop nausea and vomiting that is not controlled by your nausea medication, call the clinic.   BELOW ARE SYMPTOMS THAT SHOULD BE REPORTED IMMEDIATELY:  *FEVER GREATER THAN 100.5 F  *CHILLS WITH OR WITHOUT FEVER  NAUSEA AND VOMITING THAT IS NOT CONTROLLED WITH YOUR NAUSEA MEDICATION  *UNUSUAL SHORTNESS OF BREATH  *UNUSUAL BRUISING OR BLEEDING  TENDERNESS IN MOUTH AND THROAT WITH OR WITHOUT PRESENCE OF ULCERS  *URINARY PROBLEMS  *BOWEL PROBLEMS  UNUSUAL RASH Items with * indicate a potential emergency and should be followed up as soon as possible.  Feel free to call the clinic should you have any questions or concerns. The clinic phone number is [phone].  Please show the CHEMO ALERT CARD at check-in to the Emergency Department and triage nurse.   

## 2017-06-06 NOTE — Patient Instructions (Signed)
Implanted Port Home Guide An implanted port is a type of central line that is placed under the skin. Central lines are used to provide IV access when treatment or nutrition needs to be given through a person's veins. Implanted ports are used for long-term IV access. An implanted port may be placed because:  You need IV medicine that would be irritating to the small veins in your hands or arms.  You need long-term IV medicines, such as antibiotics.  You need IV nutrition for a long period.  You need frequent blood draws for lab tests.  You need dialysis.  Implanted ports are usually placed in the chest area, but they can also be placed in the upper arm, the abdomen, or the leg. An implanted port has two main parts:  Reservoir. The reservoir is round and will appear as a small, raised area under your skin. The reservoir is the part where a needle is inserted to give medicines or draw blood.  Catheter. The catheter is a thin, flexible tube that extends from the reservoir. The catheter is placed into a large vein. Medicine that is inserted into the reservoir goes into the catheter and then into the vein.  How will I care for my incision site? Do not get the incision site wet. Bathe or shower as directed by your health care provider. How is my port accessed? Special steps must be taken to access the port:  Before the port is accessed, a numbing cream can be placed on the skin. This helps numb the skin over the port site.  Your health care provider uses a sterile technique to access the port. ? Your health care provider must put on a mask and sterile gloves. ? The skin over your port is cleaned carefully with an antiseptic and allowed to dry. ? The port is gently pinched between sterile gloves, and a needle is inserted into the port.  Only "non-coring" port needles should be used to access the port. Once the port is accessed, a blood return should be checked. This helps ensure that the port  is in the vein and is not clogged.  If your port needs to remain accessed for a constant infusion, a clear (transparent) bandage will be placed over the needle site. The bandage and needle will need to be changed every week, or as directed by your health care provider.  Keep the bandage covering the needle clean and dry. Do not get it wet. Follow your health care provider's instructions on how to take a shower or bath while the port is accessed.  If your port does not need to stay accessed, no bandage is needed over the port.  What is flushing? Flushing helps keep the port from getting clogged. Follow your health care provider's instructions on how and when to flush the port. Ports are usually flushed with saline solution or a medicine called heparin. The need for flushing will depend on how the port is used.  If the port is used for intermittent medicines or blood draws, the port will need to be flushed: ? After medicines have been given. ? After blood has been drawn. ? As part of routine maintenance.  If a constant infusion is running, the port may not need to be flushed.  How long will my port stay implanted? The port can stay in for as long as your health care provider thinks it is needed. When it is time for the port to come out, surgery will be   done to remove it. The procedure is similar to the one performed when the port was put in. When should I seek immediate medical care? When you have an implanted port, you should seek immediate medical care if:  You notice a bad smell coming from the incision site.  You have swelling, redness, or drainage at the incision site.  You have more swelling or pain at the port site or the surrounding area.  You have a fever that is not controlled with medicine.  This information is not intended to replace advice given to you by your health care provider. Make sure you discuss any questions you have with your health care provider. Document  Released: 04/17/2005 Document Revised: 09/23/2015 Document Reviewed: 12/23/2012 Elsevier Interactive Patient Education  2017 [employer]  

## 2017-06-06 NOTE — Progress Notes (Signed)
[facility] OFFICE PROGRESS NOTE  [name], MD [place] [address]  DIAGNOSIS: Unresectable stage IIIA(T4, N0, M0) non-small cell lung cancer, squamous cell carcinoma diagnosed in May 2018 and presented with large obstructing left lower lobe lung mass suspicious small mediastinal lymphadenopathy.  PRIOR THERAPY: A course of concurrent chemoradiation with weekly carboplatin for AUC of 2 and paclitaxel 45 MG/M2. Status post 5 cycles. She received her radiation in [facility]. Last dose of chemotherapy was given 11/27/2016.  CURRENT THERAPY: consolidation immunotherapy with Imfinzi (Durvalumab) 10 MG/KG every 2 weeks, first dose 02/21/2017.  Status post 7 cycles.  INTERVAL HISTORY: Sarah [name] 67 y.o. female returns for routine follow-up visit accompanied by her husband.  The patient is feeling fine today and has no specific complaints of her baseline shortness of breath and she is currently on home oxygen secondary to COPD.  The patient had increased cough and thick green sputum last week and was placed on azithromycin.  She has completed the antibiotic and her cough has improved.  She denies fevers and chills.  Denies chest pain and hemoptysis.  She has an ongoing nonproductive cough.  Denies nausea, vomiting, constipation, diarrhea.  She has not had a significant weight loss or night sweats.  The patient is here for evaluation prior to cycle #8 of her treatment.  MEDICAL HISTORY: Past Medical History:  Diagnosis Date  . Arthritis   . Cancer ([place])    lung cancer  . Chronic anxiety    Patient describes as stress  . Chronic back pain   . DVT (deep venous thrombosis) ([place]) age 35  . GERD (gastroesophageal reflux disease)   . Goals of care, counseling/discussion 09/21/2016  . Headache(784.0)   . Hypoestrogenism   . Lower extremity venous stasis    LLE, chronic  . Obesity   . Odynophagia 11/13/2016  . Paroxysmal atrial  fibrillation (HCC)   . PONV (postoperative nausea and vomiting)   . Pulmonary embolism ([place])   . Reflux   . Sleep apnea    wear CPAP  . Spinal stenosis   . Tobacco abuse   . Venous stasis   . Warfarin anticoagulation     ALLERGIES:  is allergic to aspirin.  MEDICATIONS:  Current Outpatient Medications  Medication Sig Dispense Refill  . budesonide-formoterol (SYMBICORT) 160-4.5 MCG/ACT inhaler Inhale 2 puffs into the lungs 2 (two) times daily. 1 Inhaler 12  . buPROPion (WELLBUTRIN SR) 150 MG 12 hr tablet TAKE 1 TABLET BY MOUTH TWICE DAILY **KEEP APPT ON 06/09/16** (Patient taking differently: Take 150 mg by mouth daily. TAKE 1 TABLET BY MOUTH TWICE DAILY **KEEP APPT ON 06/09/16**) 180 tablet 0  . calcium carbonate (TUMS - DOSED IN MG ELEMENTAL CALCIUM) 500 MG chewable tablet Chew 1 tablet by mouth 2 (two) times a week.     . diltiazem (CARDIZEM CD) 180 MG 24 hr capsule TAKE ONE CAPSULE BY MOUTH EVERY DAY 90 capsule 1  . flecainide (TAMBOCOR) 50 MG tablet TAKE 1 TABLET (50 MG TOTAL) BY MOUTH 2 (TWO) TIMES DAILY. 60 tablet 2  . HYDROcodone-acetaminophen (NORCO) 7.5-325 MG tablet One tablet up to three times a day as needed 90 tablet 0  . ketoconazole (NIZORAL) 2 % cream Apply BID for rash 30 g 2  . lidocaine-prilocaine (EMLA) cream Apply 1 application as needed topically. 30 g 2  . pantoprazole (PROTONIX) 40 MG tablet TAKE 1 TABLET EVERY MORNING 90 tablet 0  . prochlorperazine (  COMPAZINE) 10 MG tablet Take 1 tablet (10 mg total) by mouth every 6 (six) hours as needed for nausea or vomiting. 30 tablet 0  . promethazine (PHENERGAN) 25 MG tablet TAKE 1 TABLET BY MOUTH EVERY 6 HOURS AS NEEDED FOR NAUSEA/VOMITING 30 tablet 0  . sucralfate (CARAFATE) 1 g tablet Take 1 tablet (1 g total) by mouth 2 (two) times daily. 60 tablet 0  . warfarin (COUMADIN) 5 MG tablet Take 0-2.5 mg daily by mouth. Take 2.5 mg Daily except none on Monday and Thursday.    Marland Kitchen albuterol (PROVENTIL HFA;VENTOLIN HFA) 108 (90  Base) MCG/ACT inhaler Inhale 2 puffs into the lungs every 6 (six) hours as needed for wheezing or shortness of breath. 1 Inhaler 5  . metoprolol tartrate (LOPRESSOR) 25 MG tablet TAKE 1 TABLET AS NEEDED FOR HEART RACING (Patient not taking: Reported on 06/06/2017) 10 tablet 5   No current facility-administered medications for this visit.    Facility-Administered Medications Ordered in Other Visits  Medication Dose Route Frequency Provider Last Rate Last Dose  . sodium chloride flush (NS) 0.9 % injection 10 mL  10 mL Intracatheter PRN Curt Bears, MD   10 mL at 06/06/17 1251    SURGICAL HISTORY:  Past Surgical History:  Procedure Laterality Date  . ANKLE SURGERY     x2  . APPENDECTOMY    . CARPAL TUNNEL RELEASE    . CHOLECYSTECTOMY    . IR FLUORO GUIDE PORT INSERTION RIGHT  03/19/2017  . IR US GUIDE VASC ACCESS RIGHT  03/19/2017  . OVARIAN CYST REMOVAL    . ROTATOR CUFF REPAIR    . TUBAL LIGATION    . VIDEO BRONCHOSCOPY WITH ENDOBRONCHIAL ULTRASOUND N/A 09/14/2016   Procedure: VIDEO BRONCHOSCOPY WITH ENDOBRONCHIAL ULTRASOUND;  Surgeon: Melrose Nakayama, MD;  Location: [facility] OR;  Service: Thoracic;  Laterality: N/A;    REVIEW OF SYSTEMS:   Review of Systems  Constitutional: Negative for appetite change, chills, fatigue, fever and unexpected weight change.  HENT:   Negative for mouth sores, nosebleeds, sore throat and trouble swallowing.   Eyes: Negative for eye problems and icterus.  Respiratory: Negative for hemoptysis, shortness of breath.  Positive for cough and her baseline shortness of breath.  She wears home oxygen.  Cardiovascular: Negative for chest pain and leg swelling.  Gastrointestinal: Negative for abdominal pain, constipation, diarrhea, nausea and vomiting.  Genitourinary: Negative for bladder incontinence, difficulty urinating, dysuria, frequency and hematuria.   Musculoskeletal: Negative for back pain, gait problem, neck pain and neck stiffness.  Skin: Negative  for itching and rash.  Neurological: Negative for dizziness, extremity weakness, gait problem, headaches, light-headedness and seizures.  Hematological: Negative for adenopathy. Does not bruise/bleed easily.  Psychiatric/Behavioral: Negative for confusion, depression and sleep disturbance. The patient is not nervous/anxious.     PHYSICAL EXAMINATION:  Blood pressure (!) 115/42, pulse 73, temperature 98.5 F (36.9 C), temperature source Oral, resp. rate 18, height 5\' 7"  (1.702 m), weight 239 lb 14.4 oz (108.8 kg), SpO2 100 %.  ECOG PERFORMANCE STATUS: 1 - Symptomatic but completely ambulatory  Physical Exam  Constitutional: Oriented to person, place, and time and well-developed, well-nourished, and in no distress. No distress.  HENT:  Head: Normocephalic and atraumatic.  Mouth/Throat: Oropharynx is clear and moist. No oropharyngeal exudate.  Eyes: Conjunctivae are normal. Right eye exhibits no discharge. Left eye exhibits no discharge. No scleral icterus.  Neck: Normal range of motion. Neck supple.  Cardiovascular: Normal rate, regular rhythm, normal heart sounds and  intact distal pulses.   Pulmonary/Chest: Effort normal and breath sounds normal. No respiratory distress. No wheezes. No rales.  Abdominal: Soft. Bowel sounds are normal. Exhibits no distension and no mass. There is no tenderness.  Musculoskeletal: Normal range of motion. Exhibits no edema.  Lymphadenopathy:    No cervical adenopathy.  Neurological: Alert and oriented to person, place, and time. Exhibits normal muscle tone. Coordination normal.  Skin: Skin is warm and dry. No rash noted. Not diaphoretic. No erythema. No pallor.  Psychiatric: Mood, memory and judgment normal.  Vitals reviewed.  LABORATORY DATA: Lab Results  Component Value Date   WBC 5.2 06/06/2017   HGB 12.9 06/06/2017   HCT 39.0 06/06/2017   MCV 92.8 06/06/2017   PLT 207 06/06/2017      Chemistry      Component Value Date/Time   NA 142  06/06/2017 0900   NA 142 04/26/2017 1106   K 3.9 06/06/2017 0900   K 3.4 (L) 04/26/2017 1106   CL 103 06/06/2017 0900   CO2 29 06/06/2017 0900   CO2 29 04/26/2017 1106   BUN 9 06/06/2017 0900   BUN 7.5 04/26/2017 1106   CREATININE 0.91 06/06/2017 0900   CREATININE 0.9 04/26/2017 1106      Component Value Date/Time   CALCIUM 9.2 06/06/2017 0900   CALCIUM 9.1 04/26/2017 1106   ALKPHOS 100 06/06/2017 0900   ALKPHOS 99 04/26/2017 1106   AST 13 06/06/2017 0900   AST 14 04/26/2017 1106   ALT 11 06/06/2017 0900   ALT 12 04/26/2017 1106   BILITOT 0.3 06/06/2017 0900   BILITOT 0.28 04/26/2017 1106       RADIOGRAPHIC STUDIES:  Ct Chest W Contrast  Result Date: 05/21/2017 CLINICAL DATA:  Unresectable stage IIIA left lower lobe squamous cell lung carcinoma diagnosed May 2018 status post concurrent chemo radiation therapy with ongoing consolidation immunotherapy. Patient presents for restaging. EXAM: CT CHEST WITH CONTRAST TECHNIQUE: Multidetector CT imaging of the chest was performed during intravenous contrast administration. CONTRAST:  63mL ISOVUE-300 IOPAMIDOL (ISOVUE-300) INJECTION 61% COMPARISON:  12/25/2016 chest CT. FINDINGS: Cardiovascular: Normal heart size. New small pericardial effusion/thickening. Atherosclerotic nonaneurysmal thoracic aorta. Stable dilated main pulmonary artery (3.5 cm diameter). Right internal jugular MediPort terminates in middle third of the superior vena cava. No central pulmonary emboli. Mediastinum/Nodes: No discrete thyroid nodules. Unremarkable esophagus. No pathologically enlarged axillary, mediastinal or hilar lymph nodes. Lungs/Pleura: No pneumothorax. Moderate centrilobular emphysema with mild diffuse bronchial wall thickening. Small left pleural effusion is mildly increased. There is worsened significant volume loss in the left hemithorax. Central left lower lobe 6.5 x 3.7 cm lung mass (series 2/image 66), previously 6.9 x 4.4 cm on 12/25/2016 chest CT,  mildly decreased. There is new mild aeration in left lower lobe. Patchy left upper lobe parahilar consolidation and ground-glass attenuation with associated volume loss, most compatible with evolving postradiation change. New 3 mm basilar right lower lobe pulmonary nodule (series 4/image 109). New irregular vaguely nodular bandlike opacity in peripheral right lower lobe (series 4/image 103). No additional significant pulmonary nodules. Upper abdomen: Cholecystectomy. Simple 2.1 cm upper right renal cyst. Stable 2.9 cm right adrenal adenoma. Musculoskeletal: Increased small focus of sclerosis in the posteromedial left seventh rib. No new focal osseous lesions. Moderate thoracic spondylosis. IMPRESSION: 1. Central left lower lobe lung mass is mildly decreased. Mildly improved aeration in the left lower lobe. 2. Increased small focus of sclerosis in the posteromedial left seventh rib adjacent to the central left lower lobe lung mass,  favor evolving post treatment change at a tiny site of direct rib invasion by the tumor. 3. Evolving postradiation change in the parahilar left upper lung. 4. No definite findings of metastatic disease in the chest. New vaguely nodular bandlike opacity in the peripheral right lower lobe. New tiny 3 mm right lower lobe pulmonary nodule. These findings are nonspecific and warrant attention on follow-up chest CT in 3 months. 5. New small pericardial effusion/thickening. Small left pleural effusion is mildly increased. 6. Stable dilated main pulmonary artery, suggesting chronic pulmonary arterial hypertension. 7. Stable right adrenal adenoma. Aortic Atherosclerosis (ICD10-I70.0) and Emphysema (ICD10-J43.9). Electronically Signed   By: Ilona Sorrel M.D.   On: 05/21/2017 14:26     ASSESSMENT/PLAN:  Squamous cell carcinoma of lung, left (HCC) This is a very pleasant 67 year old with unresectable a stage IIIa non-small cell lung cancer. The patient underwent a course of concurrent  chemoradiation with weekly carboplatin and paclitaxel status post 8 cycles and has been tolerating this treatment fairly well except for mild fatigue and odynophagia. The patient is currently undergoing consolidation immunotherapy with Imfinzi (Durvalumab) status post 7 cycles. The patient is tolerating this treatment fairly well with no concerning complaints. Recommend that she proceed with cycle 8 of her treatment as scheduled.  She will return in 2 weeks for evaluation prior to cycle #9.  For COPD, she would continue on home oxygen in addition to her current treatment with inhalers.  She was advised to call immediately if she has any concerning symptoms in the interval. The patient voices understanding of current disease status and treatment options and is in agreement with the current care plan. All questions were answered. The patient knows to call the clinic with any problems, questions or concerns. We can certainly see the patient much sooner if necessary.  No orders of the defined types were placed in this encounter.   Mikey Bussing, DNP, AGPCNP-BC, AOCNP 06/06/17

## 2017-06-12 ENCOUNTER — Ambulatory Visit (INDEPENDENT_AMBULATORY_CARE_PROVIDER_SITE_OTHER): Payer: Medicare Other | Admitting: *Deleted

## 2017-06-12 DIAGNOSIS — Z7901 Long term (current) use of anticoagulants: Secondary | ICD-10-CM | POA: Diagnosis not present

## 2017-06-12 LAB — POCT INR: INR: 2

## 2017-06-14 ENCOUNTER — Other Ambulatory Visit: Payer: Self-pay | Admitting: Family Medicine

## 2017-06-20 ENCOUNTER — Encounter: Payer: Self-pay | Admitting: Internal Medicine

## 2017-06-20 ENCOUNTER — Inpatient Hospital Stay (HOSPITAL_BASED_OUTPATIENT_CLINIC_OR_DEPARTMENT_OTHER): Payer: Medicare Other | Admitting: Internal Medicine

## 2017-06-20 ENCOUNTER — Inpatient Hospital Stay: Payer: Medicare Other

## 2017-06-20 ENCOUNTER — Encounter: Payer: Self-pay | Admitting: *Deleted

## 2017-06-20 VITALS — BP 118/57 | HR 72 | Temp 98.5°F | Resp 14 | Wt 241.1 lb

## 2017-06-20 DIAGNOSIS — M549 Dorsalgia, unspecified: Secondary | ICD-10-CM | POA: Diagnosis not present

## 2017-06-20 DIAGNOSIS — C3492 Malignant neoplasm of unspecified part of left bronchus or lung: Secondary | ICD-10-CM

## 2017-06-20 DIAGNOSIS — I480 Paroxysmal atrial fibrillation: Secondary | ICD-10-CM

## 2017-06-20 DIAGNOSIS — Z7901 Long term (current) use of anticoagulants: Secondary | ICD-10-CM

## 2017-06-20 DIAGNOSIS — J90 Pleural effusion, not elsewhere classified: Secondary | ICD-10-CM

## 2017-06-20 DIAGNOSIS — R599 Enlarged lymph nodes, unspecified: Secondary | ICD-10-CM

## 2017-06-20 DIAGNOSIS — C3432 Malignant neoplasm of lower lobe, left bronchus or lung: Secondary | ICD-10-CM

## 2017-06-20 DIAGNOSIS — E669 Obesity, unspecified: Secondary | ICD-10-CM

## 2017-06-20 DIAGNOSIS — D3501 Benign neoplasm of right adrenal gland: Secondary | ICD-10-CM

## 2017-06-20 DIAGNOSIS — Z9981 Dependence on supplemental oxygen: Secondary | ICD-10-CM

## 2017-06-20 DIAGNOSIS — Z86711 Personal history of pulmonary embolism: Secondary | ICD-10-CM

## 2017-06-20 DIAGNOSIS — J449 Chronic obstructive pulmonary disease, unspecified: Secondary | ICD-10-CM | POA: Diagnosis not present

## 2017-06-20 DIAGNOSIS — Z79899 Other long term (current) drug therapy: Secondary | ICD-10-CM

## 2017-06-20 DIAGNOSIS — M129 Arthropathy, unspecified: Secondary | ICD-10-CM

## 2017-06-20 DIAGNOSIS — G4730 Sleep apnea, unspecified: Secondary | ICD-10-CM

## 2017-06-20 DIAGNOSIS — I313 Pericardial effusion (noninflammatory): Secondary | ICD-10-CM

## 2017-06-20 DIAGNOSIS — I878 Other specified disorders of veins: Secondary | ICD-10-CM

## 2017-06-20 DIAGNOSIS — Z86718 Personal history of other venous thrombosis and embolism: Secondary | ICD-10-CM

## 2017-06-20 DIAGNOSIS — F17210 Nicotine dependence, cigarettes, uncomplicated: Secondary | ICD-10-CM

## 2017-06-20 DIAGNOSIS — K219 Gastro-esophageal reflux disease without esophagitis: Secondary | ICD-10-CM | POA: Diagnosis not present

## 2017-06-20 DIAGNOSIS — Z923 Personal history of irradiation: Secondary | ICD-10-CM

## 2017-06-20 DIAGNOSIS — Z5112 Encounter for antineoplastic immunotherapy: Secondary | ICD-10-CM | POA: Diagnosis not present

## 2017-06-20 DIAGNOSIS — F419 Anxiety disorder, unspecified: Secondary | ICD-10-CM | POA: Diagnosis not present

## 2017-06-20 DIAGNOSIS — Z9221 Personal history of antineoplastic chemotherapy: Secondary | ICD-10-CM

## 2017-06-20 DIAGNOSIS — N281 Cyst of kidney, acquired: Secondary | ICD-10-CM

## 2017-06-20 DIAGNOSIS — I700 Atherosclerosis of aorta: Secondary | ICD-10-CM

## 2017-06-20 DIAGNOSIS — Z95828 Presence of other vascular implants and grafts: Secondary | ICD-10-CM

## 2017-06-20 LAB — COMPREHENSIVE METABOLIC PANEL
ALBUMIN: 3.6 g/dL (ref 3.5–5.0)
ALK PHOS: 97 U/L (ref 40–150)
ALT: 15 U/L (ref 0–55)
ANION GAP: 10 (ref 3–11)
AST: 15 U/L (ref 5–34)
BUN: 9 mg/dL (ref 7–26)
CALCIUM: 9.7 mg/dL (ref 8.4–10.4)
CHLORIDE: 104 mmol/L (ref 98–109)
CO2: 28 mmol/L (ref 22–29)
Creatinine, Ser: 0.96 mg/dL (ref 0.60–1.10)
GFR calc non Af Amer: >60 mL/min (ref >60)
GLUCOSE: 89 mg/dL (ref 70–140)
Potassium: 3.8 mmol/L (ref 3.5–5.1)
SODIUM: 142 mmol/L (ref 136–145)
Total Bilirubin: 0.3 mg/dL (ref 0.2–1.2)
Total Protein: 6.7 g/dL (ref 6.4–8.3)

## 2017-06-20 LAB — CBC WITH DIFFERENTIAL/PLATELET
BASOS PCT: 1 %
Basophils Absolute: 0 10*3/uL (ref 0.0–0.1)
Eosinophils Absolute: 0.1 10*3/uL (ref 0.0–0.5)
Eosinophils Relative: 2 %
HEMATOCRIT: 38.9 % (ref 34.8–46.6)
HEMOGLOBIN: 12.9 g/dL (ref 11.6–15.9)
LYMPHS ABS: 0.4 10*3/uL — AB (ref 0.9–3.3)
Lymphocytes Relative: 9 %
MCH: 30.4 pg (ref 25.1–34.0)
MCHC: 33.1 g/dL (ref 31.5–36.0)
MCV: 91.8 fL (ref 79.5–101.0)
MONO ABS: 0.4 10*3/uL (ref 0.1–0.9)
MONOS PCT: 9 %
NEUTROS ABS: 3.8 10*3/uL (ref 1.5–6.5)
NEUTROS PCT: 79 %
Platelets: 186 10*3/uL (ref 145–400)
RBC: 4.24 MIL/uL (ref 3.70–5.45)
RDW: 14.8 % — AB (ref 11.2–14.5)
WBC: 4.8 10*3/uL (ref 3.9–10.3)

## 2017-06-20 MED ORDER — SODIUM CHLORIDE 0.9% FLUSH
10.0000 mL | INTRAVENOUS | Status: DC | PRN
  Administered 2017-06-20: 10 mL
  Filled 2017-06-20: qty 10

## 2017-06-20 MED ORDER — SODIUM CHLORIDE 0.9% FLUSH
10.0000 mL | INTRAVENOUS | Status: DC | PRN
  Administered 2017-06-20: 10 mL via INTRAVENOUS
  Filled 2017-06-20: qty 10

## 2017-06-20 MED ORDER — SODIUM CHLORIDE 0.9 % IV SOLN
Freq: Once | INTRAVENOUS | Status: AC
  Administered 2017-06-20: 11:00:00 via INTRAVENOUS

## 2017-06-20 MED ORDER — SODIUM CHLORIDE 0.9 % IV SOLN
1120.0000 mg | Freq: Once | INTRAVENOUS | Status: AC
  Administered 2017-06-20: 1120 mg via INTRAVENOUS
  Filled 2017-06-20: qty 20

## 2017-06-20 MED ORDER — HEPARIN SOD (PORK) LOCK FLUSH 100 UNIT/ML IV SOLN
500.0000 [IU] | Freq: Once | INTRAVENOUS | Status: AC | PRN
  Administered 2017-06-20: 500 [IU]
  Filled 2017-06-20: qty 5

## 2017-06-20 NOTE — Progress Notes (Signed)
Sarah [name] Telephone:([phone]   Fax:([phone]) [phone]  OFFICE PROGRESS NOTE  [name], MD [address] [place] [address]  DIAGNOSIS: Unresectable stage IIIA (T4, N0, M0) non-small cell lung cancer, squamous cell carcinoma diagnosed in May 2018 and presented with large obstructing left lower lobe lung mass suspicious small mediastinal lymphadenopathy.  PRIOR THERAPY: A course of concurrent chemoradiation with weekly carboplatin for AUC of 2 and paclitaxel 45 MG/M2. Status post 5 cycles. She received her radiation in [facility]. Last dose of chemotherapy was given 11/27/2016.Marland Kitchen  CURRENT THERAPY: consolidation immunotherapy with Imfinzi (Durvalumab) 10 MG/KG every 2 weeks, first dose 02/21/2017.  Status post 8 cycles.  INTERVAL HISTORY: Sarah [name] 67 y.o. female returns to the clinic today for follow-up visit accompanied by her husband.  The patient is feeling fine today with no specific complaints.  She tolerated the last cycle of her treatment well.  She continued to have the baseline shortness of breath and she is currently on home oxygen.  She denied having any chest pain, cough or hemoptysis.  She denied having any recent weight loss or night sweats.  She has no nausea, vomiting, diarrhea or constipation.  MEDICAL HISTORY: Past Medical History:  Diagnosis Date  . Arthritis   . Cancer ([place])    lung cancer  . Chronic anxiety    Patient describes as stress  . Chronic back pain   . DVT (deep venous thrombosis) ([place]) age 52  . GERD (gastroesophageal reflux disease)   . Goals of care, counseling/discussion 09/21/2016  . Headache(784.0)   . Hypoestrogenism   . Lower extremity venous stasis    LLE, chronic  . Obesity   . Odynophagia 11/13/2016  . Paroxysmal atrial fibrillation (HCC)   . PONV (postoperative nausea and vomiting)   . Pulmonary embolism ([place])   . Reflux   . Sleep apnea    wear CPAP  . Spinal stenosis   .  Tobacco abuse   . Venous stasis   . Warfarin anticoagulation     ALLERGIES:  is allergic to aspirin.  MEDICATIONS:  Current Outpatient Medications  Medication Sig Dispense Refill  . albuterol (PROVENTIL HFA;VENTOLIN HFA) 108 (90 Base) MCG/ACT inhaler Inhale 2 puffs into the lungs every 6 (six) hours as needed for wheezing or shortness of breath. 1 Inhaler 5  . budesonide-formoterol (SYMBICORT) 160-4.5 MCG/ACT inhaler Inhale 2 puffs into the lungs 2 (two) times daily. 1 Inhaler 12  . buPROPion (WELLBUTRIN SR) 150 MG 12 hr tablet TAKE 1 TABLET BY MOUTH TWICE DAILY **KEEP APPT ON 06/09/16** (Patient taking differently: Take 150 mg by mouth daily. TAKE 1 TABLET BY MOUTH TWICE DAILY **KEEP APPT ON 06/09/16**) 180 tablet 0  . calcium carbonate (TUMS - DOSED IN MG ELEMENTAL CALCIUM) 500 MG chewable tablet Chew 1 tablet by mouth 2 (two) times a week.     . diltiazem (CARDIZEM CD) 180 MG 24 hr capsule TAKE ONE CAPSULE BY MOUTH EVERY DAY 90 capsule 1  . flecainide (TAMBOCOR) 50 MG tablet TAKE 1 TABLET (50 MG TOTAL) BY MOUTH 2 (TWO) TIMES DAILY. 60 tablet 2  . HYDROcodone-acetaminophen (NORCO) 7.5-325 MG tablet One tablet up to three times a day as needed 90 tablet 0  . ketoconazole (NIZORAL) 2 % cream Apply BID for rash 30 g 2  . lidocaine-prilocaine (EMLA) cream Apply 1 application as needed topically. 30 g 2  . metoprolol tartrate (LOPRESSOR) 25 MG tablet TAKE 1  TABLET AS NEEDED FOR HEART RACING 10 tablet 5  . pantoprazole (PROTONIX) 40 MG tablet TAKE 1 TABLET EVERY MORNING 90 tablet 0  . prochlorperazine (COMPAZINE) 10 MG tablet Take 1 tablet (10 mg total) by mouth every 6 (six) hours as needed for nausea or vomiting. 30 tablet 0  . sucralfate (CARAFATE) 1 g tablet Take 1 tablet (1 g total) by mouth 2 (two) times daily. 60 tablet 0  . warfarin (COUMADIN) 5 MG tablet Take 0-2.5 mg daily by mouth. Take 2.5 mg Daily except none on Monday and Thursday.    . promethazine (PHENERGAN) 25 MG tablet TAKE 1  TABLET BY MOUTH EVERY 6 HOURS AS NEEDED FOR NAUSEA/VOMITING (Patient not taking: Reported on 06/20/2017) 30 tablet 0   No current facility-administered medications for this visit.     SURGICAL HISTORY:  Past Surgical History:  Procedure Laterality Date  . ANKLE SURGERY     x2  . APPENDECTOMY    . CARPAL TUNNEL RELEASE    . CHOLECYSTECTOMY    . IR FLUORO GUIDE PORT INSERTION RIGHT  03/19/2017  . IR US GUIDE VASC ACCESS RIGHT  03/19/2017  . OVARIAN CYST REMOVAL    . ROTATOR CUFF REPAIR    . TUBAL LIGATION    . VIDEO BRONCHOSCOPY WITH ENDOBRONCHIAL ULTRASOUND N/A 09/14/2016   Procedure: VIDEO BRONCHOSCOPY WITH ENDOBRONCHIAL ULTRASOUND;  Surgeon: Melrose Nakayama, MD;  Location: [facility] OR;  Service: Thoracic;  Laterality: N/A;    REVIEW OF SYSTEMS:  A comprehensive review of systems was negative except for: Respiratory: positive for dyspnea on exertion   PHYSICAL EXAMINATION: General appearance: alert, cooperative and no distress Head: Normocephalic, without obvious abnormality, atraumatic Neck: no adenopathy, no JVD, supple, symmetrical, trachea midline and thyroid not enlarged, symmetric, no tenderness/mass/nodules Lymph nodes: Cervical, supraclavicular, and axillary nodes normal. Resp: clear to auscultation bilaterally Back: symmetric, no curvature. ROM normal. No CVA tenderness. Cardio: regular rate and rhythm, S1, S2 normal, no murmur, click, rub or gallop GI: soft, non-tender; bowel sounds normal; no masses,  no organomegaly Extremities: extremities normal, atraumatic, no cyanosis or edema  ECOG PERFORMANCE STATUS: 1 - Symptomatic but completely ambulatory  Blood pressure (!) 118/57, pulse 72, temperature 98.5 F (36.9 C), temperature source Oral, resp. rate 14, weight 241 lb 1.6 oz (109.4 kg), SpO2 96 %.  LABORATORY DATA: Lab Results  Component Value Date   WBC 4.8 06/20/2017   HGB 12.9 06/20/2017   HCT 38.9 06/20/2017   MCV 91.8 06/20/2017   PLT 186 06/20/2017       Chemistry      Component Value Date/Time   NA 142 06/20/2017 0930   NA 142 04/26/2017 1106   K 3.8 06/20/2017 0930   K 3.4 (L) 04/26/2017 1106   CL 104 06/20/2017 0930   CO2 28 06/20/2017 0930   CO2 29 04/26/2017 1106   BUN 9 06/20/2017 0930   BUN 7.5 04/26/2017 1106   CREATININE 0.96 06/20/2017 0930   CREATININE 0.9 04/26/2017 1106      Component Value Date/Time   CALCIUM 9.7 06/20/2017 0930   CALCIUM 9.1 04/26/2017 1106   ALKPHOS 97 06/20/2017 0930   ALKPHOS 99 04/26/2017 1106   AST 15 06/20/2017 0930   AST 14 04/26/2017 1106   ALT 15 06/20/2017 0930   ALT 12 04/26/2017 1106   BILITOT 0.3 06/20/2017 0930   BILITOT 0.28 04/26/2017 1106       RADIOGRAPHIC STUDIES: Ct Chest W Contrast  Result Date: 05/21/2017 CLINICAL DATA:  Unresectable stage IIIA left lower lobe squamous cell lung carcinoma diagnosed May 2018 status post concurrent chemo radiation therapy with ongoing consolidation immunotherapy. Patient presents for restaging. EXAM: CT CHEST WITH CONTRAST TECHNIQUE: Multidetector CT imaging of the chest was performed during intravenous contrast administration. CONTRAST:  26mL ISOVUE-300 IOPAMIDOL (ISOVUE-300) INJECTION 61% COMPARISON:  12/25/2016 chest CT. FINDINGS: Cardiovascular: Normal heart size. New small pericardial effusion/thickening. Atherosclerotic nonaneurysmal thoracic aorta. Stable dilated main pulmonary artery (3.5 cm diameter). Right internal jugular MediPort terminates in middle third of the superior vena cava. No central pulmonary emboli. Mediastinum/Nodes: No discrete thyroid nodules. Unremarkable esophagus. No pathologically enlarged axillary, mediastinal or hilar lymph nodes. Lungs/Pleura: No pneumothorax. Moderate centrilobular emphysema with mild diffuse bronchial wall thickening. Small left pleural effusion is mildly increased. There is worsened significant volume loss in the left hemithorax. Central left lower lobe 6.5 x 3.7 cm lung mass (series 2/image 66),  previously 6.9 x 4.4 cm on 12/25/2016 chest CT, mildly decreased. There is new mild aeration in left lower lobe. Patchy left upper lobe parahilar consolidation and ground-glass attenuation with associated volume loss, most compatible with evolving postradiation change. New 3 mm basilar right lower lobe pulmonary nodule (series 4/image 109). New irregular vaguely nodular bandlike opacity in peripheral right lower lobe (series 4/image 103). No additional significant pulmonary nodules. Upper abdomen: Cholecystectomy. Simple 2.1 cm upper right renal cyst. Stable 2.9 cm right adrenal adenoma. Musculoskeletal: Increased small focus of sclerosis in the posteromedial left seventh rib. No new focal osseous lesions. Moderate thoracic spondylosis. IMPRESSION: 1. Central left lower lobe lung mass is mildly decreased. Mildly improved aeration in the left lower lobe. 2. Increased small focus of sclerosis in the posteromedial left seventh rib adjacent to the central left lower lobe lung mass, favor evolving post treatment change at a tiny site of direct rib invasion by the tumor. 3. Evolving postradiation change in the parahilar left upper lung. 4. No definite findings of metastatic disease in the chest. New vaguely nodular bandlike opacity in the peripheral right lower lobe. New tiny 3 mm right lower lobe pulmonary nodule. These findings are nonspecific and warrant attention on follow-up chest CT in 3 months. 5. New small pericardial effusion/thickening. Small left pleural effusion is mildly increased. 6. Stable dilated main pulmonary artery, suggesting chronic pulmonary arterial hypertension. 7. Stable right adrenal adenoma. Aortic Atherosclerosis (ICD10-I70.0) and Emphysema (ICD10-J43.9). Electronically Signed   By: Ilona Sorrel M.D.   On: 05/21/2017 14:26    ASSESSMENT AND PLAN:  This is a very pleasant 67 years old with unresectable a stage IIIA non-small cell lung cancer. The patient underwent a course of concurrent  chemoradiation with weekly carboplatin and paclitaxel status post 8 cycles and has been tolerating this treatment fairly well except for mild fatigue and odynophagia. The patient is currently undergoing consolidation immunotherapy with Imfinzi (Durvalumab) status post 8 cycles. The patient continues to tolerate her treatment well with no concerning complaints. I recommended for her to proceed with cycle #9 today as a scheduled. I will see her back for follow-up visit in 2 weeks for evaluation before starting cycle #10. She was advised to call immediately if she has any concerning symptoms in the interval. The patient voices understanding of current disease status and treatment options and is in agreement with the current care plan. All questions were answered. The patient knows to call the clinic with any problems, questions or concerns. We can certainly see the patient much sooner if necessary.  Disclaimer: This note was dictated with  voice recognition software. Similar sounding words can inadvertently be transcribed and may not be corrected upon review.

## 2017-06-20 NOTE — Patient Instructions (Signed)
Implanted Port Home Guide An implanted port is a type of central line that is placed under the skin. Central lines are used to provide IV access when treatment or nutrition needs to be given through a person's veins. Implanted ports are used for long-term IV access. An implanted port may be placed because:  You need IV medicine that would be irritating to the small veins in your hands or arms.  You need long-term IV medicines, such as antibiotics.  You need IV nutrition for a long period.  You need frequent blood draws for lab tests.  You need dialysis.  Implanted ports are usually placed in the chest area, but they can also be placed in the upper arm, the abdomen, or the leg. An implanted port has two main parts:  Reservoir. The reservoir is round and will appear as a small, raised area under your skin. The reservoir is the part where a needle is inserted to give medicines or draw blood.  Catheter. The catheter is a thin, flexible tube that extends from the reservoir. The catheter is placed into a large vein. Medicine that is inserted into the reservoir goes into the catheter and then into the vein.  How will I care for my incision site? Do not get the incision site wet. Bathe or shower as directed by your health care provider. How is my port accessed? Special steps must be taken to access the port:  Before the port is accessed, a numbing cream can be placed on the skin. This helps numb the skin over the port site.  Your health care provider uses a sterile technique to access the port. ? Your health care provider must put on a mask and sterile gloves. ? The skin over your port is cleaned carefully with an antiseptic and allowed to dry. ? The port is gently pinched between sterile gloves, and a needle is inserted into the port.  Only "non-coring" port needles should be used to access the port. Once the port is accessed, a blood return should be checked. This helps ensure that the port  is in the vein and is not clogged.  If your port needs to remain accessed for a constant infusion, a clear (transparent) bandage will be placed over the needle site. The bandage and needle will need to be changed every week, or as directed by your health care provider.  Keep the bandage covering the needle clean and dry. Do not get it wet. Follow your health care provider's instructions on how to take a shower or bath while the port is accessed.  If your port does not need to stay accessed, no bandage is needed over the port.  What is flushing? Flushing helps keep the port from getting clogged. Follow your health care provider's instructions on how and when to flush the port. Ports are usually flushed with saline solution or a medicine called heparin. The need for flushing will depend on how the port is used.  If the port is used for intermittent medicines or blood draws, the port will need to be flushed: ? After medicines have been given. ? After blood has been drawn. ? As part of routine maintenance.  If a constant infusion is running, the port may not need to be flushed.  How long will my port stay implanted? The port can stay in for as long as your health care provider thinks it is needed. When it is time for the port to come out, surgery will be   done to remove it. The procedure is similar to the one performed when the port was put in. When should I seek immediate medical care? When you have an implanted port, you should seek immediate medical care if:  You notice a bad smell coming from the incision site.  You have swelling, redness, or drainage at the incision site.  You have more swelling or pain at the port site or the surrounding area.  You have a fever that is not controlled with medicine.  This information is not intended to replace advice given to you by your health care provider. Make sure you discuss any questions you have with your health care provider. Document  Released: 04/17/2005 Document Revised: 09/23/2015 Document Reviewed: 12/23/2012 Elsevier Interactive Patient Education  2017 [employer]  

## 2017-06-20 NOTE — Patient Instructions (Signed)
[facility] Discharge Instructions for Patients Receiving Chemotherapy  Today you received the following chemotherapy agents: Imfinzi.  To help prevent nausea and vomiting after your treatment, we encourage you to take your nausea medication as directed.   If you develop nausea and vomiting that is not controlled by your nausea medication, call the clinic.   BELOW ARE SYMPTOMS THAT SHOULD BE REPORTED IMMEDIATELY:  *FEVER GREATER THAN 100.5 F  *CHILLS WITH OR WITHOUT FEVER  NAUSEA AND VOMITING THAT IS NOT CONTROLLED WITH YOUR NAUSEA MEDICATION  *UNUSUAL SHORTNESS OF BREATH  *UNUSUAL BRUISING OR BLEEDING  TENDERNESS IN MOUTH AND THROAT WITH OR WITHOUT PRESENCE OF ULCERS  *URINARY PROBLEMS  *BOWEL PROBLEMS  UNUSUAL RASH Items with * indicate a potential emergency and should be followed up as soon as possible.  Feel free to call the clinic should you have any questions or concerns. The clinic phone number is [phone].  Please show the CHEMO ALERT CARD at check-in to the Emergency Department and triage nurse.   

## 2017-06-20 NOTE — Progress Notes (Signed)
Oncology Nurse Navigator Documentation  Oncology Nurse Navigator Flowsheets 06/20/2017  Navigator Location [facility]  Navigator Encounter Type Clinic/MDC/spoke with patient and husband today at clinic. She will be continuing on current treatment plan.  She verbalized understanding. No barriers identified at this time.   Patient Visit Type MedOnc  Treatment Phase Treatment  Barriers/Navigation Needs No barriers at this time  Acuity Level 1  Time Spent with Patient 15

## 2017-06-21 ENCOUNTER — Telehealth: Payer: Self-pay | Admitting: Internal Medicine

## 2017-06-21 NOTE — Telephone Encounter (Signed)
3 cycles already scheduled per 2/20 los - no additional appts scheduled.

## 2017-07-03 ENCOUNTER — Encounter: Payer: Self-pay | Admitting: Family Medicine

## 2017-07-03 ENCOUNTER — Ambulatory Visit (INDEPENDENT_AMBULATORY_CARE_PROVIDER_SITE_OTHER): Payer: Medicare Other | Admitting: Family Medicine

## 2017-07-03 VITALS — BP 126/82 | Ht 67.0 in | Wt 239.6 lb

## 2017-07-03 DIAGNOSIS — C3492 Malignant neoplasm of unspecified part of left bronchus or lung: Secondary | ICD-10-CM | POA: Diagnosis not present

## 2017-07-03 DIAGNOSIS — Z7901 Long term (current) use of anticoagulants: Secondary | ICD-10-CM

## 2017-07-03 DIAGNOSIS — J449 Chronic obstructive pulmonary disease, unspecified: Secondary | ICD-10-CM

## 2017-07-03 DIAGNOSIS — I4891 Unspecified atrial fibrillation: Secondary | ICD-10-CM | POA: Diagnosis not present

## 2017-07-03 LAB — POCT INR: INR: 1.9

## 2017-07-03 MED ORDER — HYDROCODONE-ACETAMINOPHEN 7.5-325 MG PO TABS: ORAL_TABLET | ORAL | 0 refills | Status: DC

## 2017-07-03 NOTE — Patient Instructions (Signed)
Continue to take 1/2 tablet each day except Wednesday. On Wednesday take 1 tab. Recheck INR in 4 weeks

## 2017-07-03 NOTE — Progress Notes (Signed)
   Subjective:    Patient ID: Sarah [name], female    DOB: 30-Oct-1950, 67 y.o.   MRN: [id number]  HPI Patient here for 2 month follow up on INR.  Results for orders placed or performed in visit on 07/03/17  POCT INR  Result Value Ref Range   INR 1.9     scn due agan next mo before the next treatment   Patient compliant with pain medication. Continues to experience the pain which led to initiation of analgesic intervention. No significant negative side effects. States definitely needs the pain medication to maintain current level of functioning. Does not receive controlled substance pain medication elsewhere.  Patient states her chronic breathing issues are fairly stable.  ReportsNo obvious side effects from medications.  Cough has improved.  known history of chronic lung disease.    Review of Systems No vomiting no diarrhea no rash    Objective:   Physical Exam  Alert and oriented, somewhat chronically sick appearing in wheelchair vitals reviewed and stable, NAD ENT-TM's and ext canals WNL bilat via otoscopic exam Soft palate, tonsils and post pharynx WNL via oropharyngeal exam Neck-symmetric, no masses; thyroid nonpalpable and diminished breath sounds diffusely but otherwise nontender Pulmonary-no tachypnea or accessory muscle use; Clear without wheezes via auscultation Card--no abnrml murmurs, rhythm reg and rate WNL Carotid pulses symmetric, without bruits       Assessment & Plan:  Impression #1 depression clinically stable to maintain same meds  2.  COPD or shortness of breath exacerbated by #3.  To maintain preventive agents  Lung cancer status post treatments in the midst of ongoing treatment  4.  Chronic pain medications discussed maintain medicines refilled  Impression: Chronic pain. Patient compliant with medication. No substantial side effects. [place] controlled substance registry reviewed to ensure compliance and proper use of medication. Patient  aware goal of medicine is not complete resolution of pain but to control his symptoms to improve his functional capacity. Aware of potential adverse side effects   Follow-up in 3 months.  Monthly inr

## 2017-07-04 ENCOUNTER — Inpatient Hospital Stay: Payer: Medicare Other

## 2017-07-04 ENCOUNTER — Inpatient Hospital Stay (HOSPITAL_BASED_OUTPATIENT_CLINIC_OR_DEPARTMENT_OTHER): Payer: Medicare Other | Admitting: Oncology

## 2017-07-04 ENCOUNTER — Encounter: Payer: Self-pay | Admitting: Oncology

## 2017-07-04 ENCOUNTER — Inpatient Hospital Stay: Payer: Medicare Other | Attending: Internal Medicine

## 2017-07-04 VITALS — BP 126/59 | HR 71 | Temp 98.7°F | Resp 20 | Ht 67.0 in | Wt 239.5 lb

## 2017-07-04 DIAGNOSIS — Z923 Personal history of irradiation: Secondary | ICD-10-CM | POA: Insufficient documentation

## 2017-07-04 DIAGNOSIS — Z86718 Personal history of other venous thrombosis and embolism: Secondary | ICD-10-CM | POA: Insufficient documentation

## 2017-07-04 DIAGNOSIS — Z86711 Personal history of pulmonary embolism: Secondary | ICD-10-CM | POA: Diagnosis not present

## 2017-07-04 DIAGNOSIS — E669 Obesity, unspecified: Secondary | ICD-10-CM | POA: Diagnosis not present

## 2017-07-04 DIAGNOSIS — F17210 Nicotine dependence, cigarettes, uncomplicated: Secondary | ICD-10-CM | POA: Diagnosis not present

## 2017-07-04 DIAGNOSIS — Z5112 Encounter for antineoplastic immunotherapy: Secondary | ICD-10-CM | POA: Insufficient documentation

## 2017-07-04 DIAGNOSIS — F419 Anxiety disorder, unspecified: Secondary | ICD-10-CM | POA: Insufficient documentation

## 2017-07-04 DIAGNOSIS — M129 Arthropathy, unspecified: Secondary | ICD-10-CM | POA: Insufficient documentation

## 2017-07-04 DIAGNOSIS — G8929 Other chronic pain: Secondary | ICD-10-CM

## 2017-07-04 DIAGNOSIS — M549 Dorsalgia, unspecified: Secondary | ICD-10-CM

## 2017-07-04 DIAGNOSIS — I480 Paroxysmal atrial fibrillation: Secondary | ICD-10-CM | POA: Insufficient documentation

## 2017-07-04 DIAGNOSIS — C3432 Malignant neoplasm of lower lobe, left bronchus or lung: Secondary | ICD-10-CM

## 2017-07-04 DIAGNOSIS — M4800 Spinal stenosis, site unspecified: Secondary | ICD-10-CM

## 2017-07-04 DIAGNOSIS — J449 Chronic obstructive pulmonary disease, unspecified: Secondary | ICD-10-CM | POA: Diagnosis not present

## 2017-07-04 DIAGNOSIS — R232 Flushing: Secondary | ICD-10-CM | POA: Diagnosis not present

## 2017-07-04 DIAGNOSIS — I878 Other specified disorders of veins: Secondary | ICD-10-CM

## 2017-07-04 DIAGNOSIS — Z95828 Presence of other vascular implants and grafts: Secondary | ICD-10-CM

## 2017-07-04 DIAGNOSIS — K219 Gastro-esophageal reflux disease without esophagitis: Secondary | ICD-10-CM | POA: Diagnosis not present

## 2017-07-04 DIAGNOSIS — C3492 Malignant neoplasm of unspecified part of left bronchus or lung: Secondary | ICD-10-CM

## 2017-07-04 DIAGNOSIS — Z7901 Long term (current) use of anticoagulants: Secondary | ICD-10-CM

## 2017-07-04 DIAGNOSIS — Z79899 Other long term (current) drug therapy: Secondary | ICD-10-CM

## 2017-07-04 DIAGNOSIS — G4730 Sleep apnea, unspecified: Secondary | ICD-10-CM | POA: Diagnosis not present

## 2017-07-04 DIAGNOSIS — R5382 Chronic fatigue, unspecified: Secondary | ICD-10-CM

## 2017-07-04 LAB — CBC WITH DIFFERENTIAL/PLATELET
BASOS PCT: 1 %
Basophils Absolute: 0 10*3/uL (ref 0.0–0.1)
EOS ABS: 0.1 10*3/uL (ref 0.0–0.5)
Eosinophils Relative: 2 %
HCT: 39.9 % (ref 34.8–46.6)
HEMOGLOBIN: 13 g/dL (ref 11.6–15.9)
Lymphocytes Relative: 8 %
Lymphs Abs: 0.4 10*3/uL — ABNORMAL LOW (ref 0.9–3.3)
MCH: 30 pg (ref 25.1–34.0)
MCHC: 32.7 g/dL (ref 31.5–36.0)
MCV: 91.9 fL (ref 79.5–101.0)
MONOS PCT: 9 %
Monocytes Absolute: 0.5 10*3/uL (ref 0.1–0.9)
Neutro Abs: 4.1 10*3/uL (ref 1.5–6.5)
Neutrophils Relative %: 80 %
Platelets: 197 10*3/uL (ref 145–400)
RBC: 4.34 MIL/uL (ref 3.70–5.45)
RDW: 14.8 % — ABNORMAL HIGH (ref 11.2–14.5)
WBC: 5.1 10*3/uL (ref 3.9–10.3)

## 2017-07-04 LAB — COMPREHENSIVE METABOLIC PANEL
ALK PHOS: 96 U/L (ref 40–150)
ALT: 13 U/L (ref 0–55)
AST: 12 U/L (ref 5–34)
Albumin: 3.5 g/dL (ref 3.5–5.0)
Anion gap: 8 (ref 3–11)
BILIRUBIN TOTAL: 0.3 mg/dL (ref 0.2–1.2)
BUN: 9 mg/dL (ref 7–26)
CALCIUM: 9.5 mg/dL (ref 8.4–10.4)
CO2: 29 mmol/L (ref 22–29)
CREATININE: 0.95 mg/dL (ref 0.60–1.10)
Chloride: 106 mmol/L (ref 98–109)
Glucose, Bld: 110 mg/dL (ref 70–140)
Potassium: 4.2 mmol/L (ref 3.5–5.1)
Sodium: 143 mmol/L (ref 136–145)
Total Protein: 6.7 g/dL (ref 6.4–8.3)

## 2017-07-04 LAB — TSH: TSH: 1.367 u[IU]/mL (ref 0.308–3.960)

## 2017-07-04 MED ORDER — SODIUM CHLORIDE 0.9 % IV SOLN
10.1000 mg/kg | Freq: Once | INTRAVENOUS | Status: AC
  Administered 2017-07-04: 1120 mg via INTRAVENOUS
  Filled 2017-07-04: qty 20

## 2017-07-04 MED ORDER — SODIUM CHLORIDE 0.9% FLUSH
10.0000 mL | INTRAVENOUS | Status: DC | PRN
  Administered 2017-07-04: 10 mL via INTRAVENOUS
  Filled 2017-07-04: qty 10

## 2017-07-04 MED ORDER — HEPARIN SOD (PORK) LOCK FLUSH 100 UNIT/ML IV SOLN
500.0000 [IU] | Freq: Once | INTRAVENOUS | Status: AC | PRN
  Administered 2017-07-04: 500 [IU]
  Filled 2017-07-04: qty 5

## 2017-07-04 MED ORDER — SODIUM CHLORIDE 0.9 % IV SOLN
Freq: Once | INTRAVENOUS | Status: AC
  Administered 2017-07-04: 16:00:00 via INTRAVENOUS

## 2017-07-04 MED ORDER — SODIUM CHLORIDE 0.9% FLUSH
10.0000 mL | INTRAVENOUS | Status: DC | PRN
  Administered 2017-07-04: 10 mL
  Filled 2017-07-04: qty 10

## 2017-07-04 NOTE — Progress Notes (Signed)
[facility] [facility] OFFICE PROGRESS NOTE  [name], MD [place] [address]  DIAGNOSIS: Unresectable stage IIIA(T4, N0, M0) non-small cell lung cancer, squamous cell carcinoma diagnosed in May 2018 and presented with large obstructing left lower lobe lung mass suspicious small mediastinal lymphadenopathy.  PRIOR THERAPY: A course of concurrent chemoradiation with weekly carboplatin for AUC of 2 and paclitaxel 45 MG/M2. Status post 5 cycles. She received her radiation in [facility]. Last dose of chemotherapy was given 11/27/2016.  CURRENT THERAPY: Consolidation immunotherapy with Imfinzi (Durvalumab) 10 MG/KG every 2 weeks, first dose 02/21/2017.  Status post 9 cycles.  INTERVAL HISTORY: Sarah [name] 67 y.o. female returns for routine follow-up visit accompanied by her husband.  The patient is feeling fine today and has no specific complaints except for intermittent hot flashes.  She reports that these have started over the past few weeks and last only for a few seconds.  She denies fevers and chills.  Denies chest pain, cough, hemoptysis.  She has her baseline shortness of breath and wears home oxygen.  Denies nausea, vomiting, constipation, diarrhea.  The patient is here for evaluation prior to cycle #10 of her treatment.  MEDICAL HISTORY: Past Medical History:  Diagnosis Date  . Arthritis   . Cancer ([place])    lung cancer  . Chronic anxiety    Patient describes as stress  . Chronic back pain   . DVT (deep venous thrombosis) ([place]) age 40  . GERD (gastroesophageal reflux disease)   . Goals of care, counseling/discussion 09/21/2016  . Headache(784.0)   . Hypoestrogenism   . Lower extremity venous stasis    LLE, chronic  . Obesity   . Odynophagia 11/13/2016  . Paroxysmal atrial fibrillation (H[facility])   . PONV (postoperative nausea and vomiting)   . Pulmonary embolism ([place])   . Reflux   . Sleep apnea    wear CPAP  . Spinal stenosis   .  Tobacco abuse   . Venous stasis   . Warfarin anticoagulation     ALLERGIES:  is allergic to aspirin.  MEDICATIONS:  Current Outpatient Medications  Medication Sig Dispense Refill  . albuterol (PROVENTIL HFA;VENTOLIN HFA) 108 (90 Base) MCG/ACT inhaler Inhale 2 puffs into the lungs every 6 (six) hours as needed for wheezing or shortness of breath. 1 Inhaler 5  . budesonide-formoterol (SYMBICORT) 160-4.5 MCG/ACT inhaler Inhale 2 puffs into the lungs 2 (two) times daily. 1 Inhaler 12  . buPROPion (WELLBUTRIN SR) 150 MG 12 hr tablet TAKE 1 TABLET BY MOUTH TWICE DAILY **KEEP APPT ON 06/09/16** (Patient taking differently: Take 150 mg by mouth daily. TAKE 1 TABLET BY MOUTH TWICE DAILY **KEEP APPT ON 06/09/16**) 180 tablet 0  . calcium carbonate (TUMS - DOSED IN MG ELEMENTAL CALCIUM) 500 MG chewable tablet Chew 1 tablet by mouth 2 (two) times a week.     . diltiazem (CARDIZEM CD) 180 MG 24 hr capsule TAKE ONE CAPSULE BY MOUTH EVERY DAY 90 capsule 1  . flecainide (TAMBOCOR) 50 MG tablet TAKE 1 TABLET (50 MG TOTAL) BY MOUTH 2 (TWO) TIMES DAILY. 60 tablet 2  . HYDROcodone-acetaminophen (NORCO) 7.5-325 MG tablet One tablet up to three times a day as needed 90 tablet 0  . ketoconazole (NIZORAL) 2 % cream Apply BID for rash 30 g 2  . lidocaine-prilocaine (EMLA) cream Apply 1 application as needed topically. 30 g 2  . metoprolol tartrate (LOPRESSOR) 25 MG tablet TAKE 1 TABLET AS NEEDED  FOR HEART RACING 10 tablet 5  . pantoprazole (PROTONIX) 40 MG tablet TAKE 1 TABLET EVERY MORNING 90 tablet 0  . prochlorperazine (COMPAZINE) 10 MG tablet Take 1 tablet (10 mg total) by mouth every 6 (six) hours as needed for nausea or vomiting. 30 tablet 0  . promethazine (PHENERGAN) 25 MG tablet TAKE 1 TABLET BY MOUTH EVERY 6 HOURS AS NEEDED FOR NAUSEA/VOMITING 30 tablet 0  . sucralfate (CARAFATE) 1 g tablet Take 1 tablet (1 g total) by mouth 2 (two) times daily. 60 tablet 0  . warfarin (COUMADIN) 5 MG tablet Take 0-2.5 mg  daily by mouth. Take 2.5 mg Daily except none on Monday and Thursday.     No current facility-administered medications for this visit.    Facility-Administered Medications Ordered in Other Visits  Medication Dose Route Frequency Provider Last Rate Last Dose  . durvalumab (IMFINZI) 1,120 mg in sodium chloride 0.9 % 100 mL chemo infusion  10.1 mg/kg (Treatment Plan Recorded) Intravenous Once Curt Bears, MD 122 mL/hr at 07/04/17 1546 1,120 mg at 07/04/17 1546  . heparin lock flush 100 unit/mL  500 Units Intracatheter Once PRN Curt Bears, MD      . sodium chloride flush (NS) 0.9 % injection 10 mL  10 mL Intracatheter PRN Curt Bears, MD        SURGICAL HISTORY:  Past Surgical History:  Procedure Laterality Date  . ANKLE SURGERY     x2  . APPENDECTOMY    . CARPAL TUNNEL RELEASE    . CHOLECYSTECTOMY    . IR FLUORO GUIDE PORT INSERTION RIGHT  03/19/2017  . IR US GUIDE VASC ACCESS RIGHT  03/19/2017  . OVARIAN CYST REMOVAL    . ROTATOR CUFF REPAIR    . TUBAL LIGATION    . VIDEO BRONCHOSCOPY WITH ENDOBRONCHIAL ULTRASOUND N/A 09/14/2016   Procedure: VIDEO BRONCHOSCOPY WITH ENDOBRONCHIAL ULTRASOUND;  Surgeon: Melrose Nakayama, MD;  Location: [facility] OR;  Service: Thoracic;  Laterality: N/A;    REVIEW OF SYSTEMS:   Review of Systems  Constitutional: Negative for appetite change, chills, fatigue, fever and unexpected weight change.  HENT:   Negative for mouth sores, nosebleeds, sore throat and trouble swallowing.   Eyes: Negative for eye problems and icterus.  Respiratory: Negative for cough, hemoptysis, and wheezing.  She has her baseline shortness of breath and wears home oxygen.  Cardiovascular: Negative for chest pain and leg swelling.  Gastrointestinal: Negative for abdominal pain, constipation, diarrhea, nausea and vomiting.  Genitourinary: Negative for bladder incontinence, difficulty urinating, dysuria, frequency and hematuria.   Musculoskeletal: Negative for back pain,  neck pain and neck stiffness.  Skin: Negative for itching and rash.  Neurological: Negative for dizziness, extremity weakness, headaches, light-headedness and seizures.  Hematological: Negative for adenopathy. Does not bruise/bleed easily.  Psychiatric/Behavioral: Negative for confusion, depression and sleep disturbance. The patient is not nervous/anxious.     PHYSICAL EXAMINATION:  Blood pressure (!) 126/59, pulse 71, temperature 98.7 F (37.1 C), temperature source Oral, resp. rate 20, height 5\' 7"  (1.702 m), weight 239 lb 8 oz (108.6 kg), SpO2 98 %.  ECOG PERFORMANCE STATUS: 1 - Symptomatic but completely ambulatory  Physical Exam  Constitutional: Oriented to person, place, and time and well-developed, well-nourished, and in no distress. No distress.  HENT:  Head: Normocephalic and atraumatic.  Mouth/Throat: Oropharynx is clear and moist. No oropharyngeal exudate.  Eyes: Conjunctivae are normal. Right eye exhibits no discharge. Left eye exhibits no discharge. No scleral icterus.  Neck: Normal range of  motion. Neck supple.  Cardiovascular: Normal rate, regular rhythm, normal heart sounds and intact distal pulses.   Pulmonary/Chest: Effort normal and breath sounds normal. No respiratory distress. No wheezes. No rales.  Abdominal: Soft. Bowel sounds are normal. Exhibits no distension and no mass. There is no tenderness.  Musculoskeletal: Normal range of motion. Exhibits no edema.  Lymphadenopathy:    No cervical adenopathy.  Neurological: Alert and oriented to person, place, and time. Exhibits normal muscle tone. Gait normal. Coordination normal.  Skin: Skin is warm and dry. No rash noted. Not diaphoretic. No erythema. No pallor.  Psychiatric: Mood, memory and judgment normal.  Vitals reviewed.  LABORATORY DATA: Lab Results  Component Value Date   WBC 5.1 07/04/2017   HGB 13.0 07/04/2017   HCT 39.9 07/04/2017   MCV 91.9 07/04/2017   PLT 197 07/04/2017      Chemistry       Component Value Date/Time   NA 143 07/04/2017 1235   NA 142 04/26/2017 1106   K 4.2 07/04/2017 1235   K 3.4 (L) 04/26/2017 1106   CL 106 07/04/2017 1235   CO2 29 07/04/2017 1235   CO2 29 04/26/2017 1106   BUN 9 07/04/2017 1235   BUN 7.5 04/26/2017 1106   CREATININE 0.95 07/04/2017 1235   CREATININE 0.9 04/26/2017 1106      Component Value Date/Time   CALCIUM 9.5 07/04/2017 1235   CALCIUM 9.1 04/26/2017 1106   ALKPHOS 96 07/04/2017 1235   ALKPHOS 99 04/26/2017 1106   AST 12 07/04/2017 1235   AST 14 04/26/2017 1106   ALT 13 07/04/2017 1235   ALT 12 04/26/2017 1106   BILITOT 0.3 07/04/2017 1235   BILITOT 0.28 04/26/2017 1106       RADIOGRAPHIC STUDIES:  No results found.   ASSESSMENT/PLAN:  Squamous cell carcinoma of lung, left (HCC) This is a very pleasant 67 year old with unresectable a stage IIIA non-small cell lung cancer. The patient underwent a course of concurrent chemoradiation with weekly carboplatin and paclitaxel status post 8 cycles and has been tolerating this treatment fairly well except for mild fatigue and odynophagia. The patient is currently undergoing consolidation immunotherapy with Imfinzi (Durvalumab) status post 9 cycles. The patient continues to tolerate her treatment well with no concerning complaints. I recommended for her to proceed with cycle #10 today as a scheduled. I will see her back for follow-up visit in 2 weeks for evaluation before starting cycle #11.  For hot flashes, we discussed treatment options including a trial of gabapentin.  The patient would like to hold off on this at this time.  We will follow up with [place] at her future appointments.  She was advised to call immediately if she has any concerning symptoms in the interval. The patient voices understanding of current disease status and treatment options and is in agreement with the current care plan. All questions were answered. The patient knows to call the clinic with any  problems, questions or concerns. We can certainly see the patient much sooner if necessary.  No orders of the defined types were placed in this encounter.  Mikey Bussing, DNP, AGPCNP-BC, AOCNP 07/04/17

## 2017-07-04 NOTE — Patient Instructions (Signed)
[facility] Discharge Instructions for Patients Receiving Chemotherapy  Today you received the following chemotherapy agents: Imfinzi.  To help prevent nausea and vomiting after your treatment, we encourage you to take your nausea medication as directed.   If you develop nausea and vomiting that is not controlled by your nausea medication, call the clinic.   BELOW ARE SYMPTOMS THAT SHOULD BE REPORTED IMMEDIATELY:  *FEVER GREATER THAN 100.5 F  *CHILLS WITH OR WITHOUT FEVER  NAUSEA AND VOMITING THAT IS NOT CONTROLLED WITH YOUR NAUSEA MEDICATION  *UNUSUAL SHORTNESS OF BREATH  *UNUSUAL BRUISING OR BLEEDING  TENDERNESS IN MOUTH AND THROAT WITH OR WITHOUT PRESENCE OF ULCERS  *URINARY PROBLEMS  *BOWEL PROBLEMS  UNUSUAL RASH Items with * indicate a potential emergency and should be followed up as soon as possible.  Feel free to call the clinic should you have any questions or concerns. The clinic phone number is [phone].  Please show the CHEMO ALERT CARD at check-in to the Emergency Department and triage nurse.   

## 2017-07-04 NOTE — Assessment & Plan Note (Signed)
This is a very pleasant 67 year old with unresectable a stage IIIA non-small cell lung cancer. The patient underwent a course of concurrent chemoradiation with weekly carboplatin and paclitaxel status post 8 cycles and has been tolerating this treatment fairly well except for mild fatigue and odynophagia. The patient is currently undergoing consolidation immunotherapy with Imfinzi (Durvalumab) status post 9 cycles. The patient continues to tolerate her treatment well with no concerning complaints. I recommended for her to proceed with cycle #10 today as a scheduled. I will see her back for follow-up visit in 2 weeks for evaluation before starting cycle #11.  For hot flashes, we discussed treatment options including a trial of gabapentin.  The patient would like to hold off on this at this time.  We will follow up with [place] at her future appointments.  She was advised to call immediately if she has any concerning symptoms in the interval. The patient voices understanding of current disease status and treatment options and is in agreement with the current care plan. All questions were answered. The patient knows to call the clinic with any problems, questions or concerns. We can certainly see the patient much sooner if necessary.

## 2017-07-05 ENCOUNTER — Telehealth: Payer: Self-pay | Admitting: Oncology

## 2017-07-05 NOTE — Telephone Encounter (Signed)
Scheduled appt per 3/6 los - patient to get an updated schedule next visit.

## 2017-07-16 ENCOUNTER — Other Ambulatory Visit: Payer: Self-pay | Admitting: *Deleted

## 2017-07-16 ENCOUNTER — Other Ambulatory Visit: Payer: Self-pay | Admitting: Family Medicine

## 2017-07-16 MED ORDER — PANTOPRAZOLE SODIUM 40 MG PO TBEC: 40.0000 mg | DELAYED_RELEASE_TABLET | Freq: Every morning | ORAL | 1 refills | Status: AC

## 2017-07-18 ENCOUNTER — Inpatient Hospital Stay (HOSPITAL_BASED_OUTPATIENT_CLINIC_OR_DEPARTMENT_OTHER): Payer: Medicare Other | Admitting: Oncology

## 2017-07-18 ENCOUNTER — Encounter: Payer: Self-pay | Admitting: Oncology

## 2017-07-18 ENCOUNTER — Inpatient Hospital Stay: Payer: Medicare Other

## 2017-07-18 VITALS — BP 122/54 | HR 65 | Temp 98.2°F | Resp 20 | Ht 67.0 in | Wt 238.9 lb

## 2017-07-18 DIAGNOSIS — J449 Chronic obstructive pulmonary disease, unspecified: Secondary | ICD-10-CM

## 2017-07-18 DIAGNOSIS — M129 Arthropathy, unspecified: Secondary | ICD-10-CM | POA: Diagnosis not present

## 2017-07-18 DIAGNOSIS — Z79899 Other long term (current) drug therapy: Secondary | ICD-10-CM

## 2017-07-18 DIAGNOSIS — I878 Other specified disorders of veins: Secondary | ICD-10-CM

## 2017-07-18 DIAGNOSIS — F419 Anxiety disorder, unspecified: Secondary | ICD-10-CM | POA: Diagnosis not present

## 2017-07-18 DIAGNOSIS — E669 Obesity, unspecified: Secondary | ICD-10-CM

## 2017-07-18 DIAGNOSIS — M549 Dorsalgia, unspecified: Secondary | ICD-10-CM

## 2017-07-18 DIAGNOSIS — C3492 Malignant neoplasm of unspecified part of left bronchus or lung: Secondary | ICD-10-CM

## 2017-07-18 DIAGNOSIS — Z7901 Long term (current) use of anticoagulants: Secondary | ICD-10-CM

## 2017-07-18 DIAGNOSIS — C3432 Malignant neoplasm of lower lobe, left bronchus or lung: Secondary | ICD-10-CM | POA: Diagnosis not present

## 2017-07-18 DIAGNOSIS — G8929 Other chronic pain: Secondary | ICD-10-CM

## 2017-07-18 DIAGNOSIS — R232 Flushing: Secondary | ICD-10-CM | POA: Diagnosis not present

## 2017-07-18 DIAGNOSIS — I480 Paroxysmal atrial fibrillation: Secondary | ICD-10-CM | POA: Diagnosis not present

## 2017-07-18 DIAGNOSIS — Z5112 Encounter for antineoplastic immunotherapy: Secondary | ICD-10-CM

## 2017-07-18 DIAGNOSIS — Z86711 Personal history of pulmonary embolism: Secondary | ICD-10-CM

## 2017-07-18 DIAGNOSIS — Z95828 Presence of other vascular implants and grafts: Secondary | ICD-10-CM

## 2017-07-18 DIAGNOSIS — K219 Gastro-esophageal reflux disease without esophagitis: Secondary | ICD-10-CM | POA: Diagnosis not present

## 2017-07-18 DIAGNOSIS — Z86718 Personal history of other venous thrombosis and embolism: Secondary | ICD-10-CM

## 2017-07-18 DIAGNOSIS — G4730 Sleep apnea, unspecified: Secondary | ICD-10-CM

## 2017-07-18 DIAGNOSIS — M4800 Spinal stenosis, site unspecified: Secondary | ICD-10-CM

## 2017-07-18 DIAGNOSIS — Z923 Personal history of irradiation: Secondary | ICD-10-CM

## 2017-07-18 DIAGNOSIS — F17210 Nicotine dependence, cigarettes, uncomplicated: Secondary | ICD-10-CM

## 2017-07-18 LAB — CBC WITH DIFFERENTIAL/PLATELET
Basophils Absolute: 0 10*3/uL (ref 0.0–0.1)
Basophils Relative: 0 %
EOS PCT: 1 %
Eosinophils Absolute: 0.1 10*3/uL (ref 0.0–0.5)
HEMATOCRIT: 39.3 % (ref 34.8–46.6)
Hemoglobin: 12.9 g/dL (ref 11.6–15.9)
LYMPHS ABS: 0.5 10*3/uL — AB (ref 0.9–3.3)
LYMPHS PCT: 10 %
MCH: 30.3 pg (ref 25.1–34.0)
MCHC: 32.9 g/dL (ref 31.5–36.0)
MCV: 92.1 fL (ref 79.5–101.0)
Monocytes Absolute: 0.4 10*3/uL (ref 0.1–0.9)
Monocytes Relative: 9 %
NEUTROS ABS: 4 10*3/uL (ref 1.5–6.5)
Neutrophils Relative %: 80 %
PLATELETS: 179 10*3/uL (ref 145–400)
RBC: 4.27 MIL/uL (ref 3.70–5.45)
RDW: 14.7 % — ABNORMAL HIGH (ref 11.2–14.5)
WBC: 5 10*3/uL (ref 3.9–10.3)

## 2017-07-18 LAB — COMPREHENSIVE METABOLIC PANEL
ALBUMIN: 3.5 g/dL (ref 3.5–5.0)
ALT: 15 U/L (ref 0–55)
ANION GAP: 8 (ref 3–11)
AST: 16 U/L (ref 5–34)
Alkaline Phosphatase: 91 U/L (ref 40–150)
BUN: 9 mg/dL (ref 7–26)
CHLORIDE: 106 mmol/L (ref 98–109)
CO2: 28 mmol/L (ref 22–29)
Calcium: 9.3 mg/dL (ref 8.4–10.4)
Creatinine, Ser: 0.92 mg/dL (ref 0.60–1.10)
GFR calc Af Amer: >60 mL/min (ref >60)
GFR calc non Af Amer: >60 mL/min (ref >60)
GLUCOSE: 109 mg/dL (ref 70–140)
POTASSIUM: 4 mmol/L (ref 3.5–5.1)
SODIUM: 142 mmol/L (ref 136–145)
Total Bilirubin: 0.3 mg/dL (ref 0.2–1.2)
Total Protein: 6.6 g/dL (ref 6.4–8.3)

## 2017-07-18 MED ORDER — SODIUM CHLORIDE 0.9 % IV SOLN
Freq: Once | INTRAVENOUS | Status: AC
  Administered 2017-07-18: 14:00:00 via INTRAVENOUS

## 2017-07-18 MED ORDER — SODIUM CHLORIDE 0.9% FLUSH
10.0000 mL | INTRAVENOUS | Status: DC | PRN
Start: 2017-07-18 — End: 2017-07-18
  Administered 2017-07-18: 10 mL
  Filled 2017-07-18: qty 10

## 2017-07-18 MED ORDER — SODIUM CHLORIDE 0.9% FLUSH
10.0000 mL | INTRAVENOUS | Status: DC | PRN
  Administered 2017-07-18: 10 mL via INTRAVENOUS
  Filled 2017-07-18: qty 10

## 2017-07-18 MED ORDER — AZITHROMYCIN 250 MG PO TABS: ORAL_TABLET | ORAL | 0 refills | Status: DC

## 2017-07-18 MED ORDER — SODIUM CHLORIDE 0.9 % IV SOLN
10.2000 mg/kg | Freq: Once | INTRAVENOUS | Status: AC
Start: 2017-07-18 — End: 2017-07-18
  Administered 2017-07-18: 1120 mg via INTRAVENOUS
  Filled 2017-07-18: qty 20

## 2017-07-18 MED ORDER — HEPARIN SOD (PORK) LOCK FLUSH 100 UNIT/ML IV SOLN
500.0000 [IU] | Freq: Once | INTRAVENOUS | Status: AC | PRN
  Administered 2017-07-18: 500 [IU]
  Filled 2017-07-18: qty 5

## 2017-07-18 NOTE — Assessment & Plan Note (Signed)
This is a very pleasant 67 year old with unresectable a stage IIIAnon-small cell lung cancer. The patient underwent a course of concurrent chemoradiation with weekly carboplatin and paclitaxel status post 8 cycles and has been tolerating this treatment fairly well except for mild fatigue and odynophagia. The patient is currently undergoing consolidation immunotherapy with Imfinzi (Durvalumab) status post10cycles. The patient continues to tolerate her treatment well with no concerning complaints. I recommended for her to proceed with cycle #11 today as a scheduled. I will see her back for follow-up visit in 2 weeks for evaluation before starting cycle #12.  For her increased cough with green sputum production, she is prone to getting pneumonia.  The patient has underlying COPD.  I have prescribed a Z-Pak for her.  She was advised to call us if she develops any increase in her cough, fevers, or increasing shortness of breath.  .  She was advised to call immediately if she has any concerning symptoms in the interval. The patient voices understanding of current disease status and treatment options and is in agreement with the current care plan. All questions were answered. The patient knows to call the clinic with any problems, questions or concerns. We can certainly see the patient much sooner if necessary.

## 2017-07-18 NOTE — Patient Instructions (Signed)
[facility] Discharge Instructions for Patients Receiving Chemotherapy  Today you received the following chemotherapy agents durvalumab (Imfinzi)  To help prevent nausea and vomiting after your treatment, we encourage you to take your nausea medication as directed   If you develop nausea and vomiting that is not controlled by your nausea medication, call the clinic.   BELOW ARE SYMPTOMS THAT SHOULD BE REPORTED IMMEDIATELY:  *FEVER GREATER THAN 100.5 F  *CHILLS WITH OR WITHOUT FEVER  NAUSEA AND VOMITING THAT IS NOT CONTROLLED WITH YOUR NAUSEA MEDICATION  *UNUSUAL SHORTNESS OF BREATH  *UNUSUAL BRUISING OR BLEEDING  TENDERNESS IN MOUTH AND THROAT WITH OR WITHOUT PRESENCE OF ULCERS  *URINARY PROBLEMS  *BOWEL PROBLEMS  UNUSUAL RASH Items with * indicate a potential emergency and should be followed up as soon as possible.  Feel free to call the clinic should you have any questions or concerns. The clinic phone number is [phone].  Please show the [place] at check-in to the Emergency Department and triage nurse.

## 2017-07-18 NOTE — Progress Notes (Signed)
Sarah [name] OFFICE PROGRESS NOTE  Sarah Kirschner, MD [place] [address]  DIAGNOSIS: Unresectable stage IIIA(T4, N0, M0) non-small cell lung cancer, squamous cell carcinoma diagnosed in May 2018 and presented with large obstructing left lower lobe lung mass suspicious small mediastinal lymphadenopathy.  PRIOR THERAPY: A course of concurrent chemoradiation with weekly carboplatin for AUC of 2 and paclitaxel 45 MG/M2. Status post 5 cycles. She received her radiation in [facility]. Last dose of chemotherapy was given 11/27/2016.  CURRENT THERAPY: Consolidation immunotherapy with Imfinzi (Durvalumab) 10 MG/KG every 2 weeks, first dose 02/21/2017. Status post 10cycles.  INTERVAL HISTORY: Sarah [name] 67 y.o. female returns for routine follow-up visit accompanied by her husband.  The patient is feeling fine today with no specific complaints except for increased congested cough.  She reports that the cough has not worsened in the past several days and she has green sputum production with it.  She denies fevers and chills.  Denies chest pain and hemoptysis.  She has her baseline shortness of breath and wears home oxygen.  Denies nausea, vomiting, constipation, diarrhea.  The patient is here for evaluation prior to cycle #11 of her treatment.  MEDICAL HISTORY: Past Medical History:  Diagnosis Date  . Arthritis   . Cancer ([place])    lung cancer  . Chronic anxiety    Patient describes as stress  . Chronic back pain   . DVT (deep venous thrombosis) ([place]) age 12  . GERD (gastroesophageal reflux disease)   . Goals of care, counseling/discussion 09/21/2016  . Headache(784.0)   . Hypoestrogenism   . Lower extremity venous stasis    LLE, chronic  . Obesity   . Odynophagia 11/13/2016  . Paroxysmal atrial fibrillation (HCC)   . PONV (postoperative nausea and vomiting)   . Pulmonary embolism ([place])   . Reflux   . Sleep apnea    wear CPAP  .  Spinal stenosis   . Tobacco abuse   . Venous stasis   . Warfarin anticoagulation     ALLERGIES:  is allergic to aspirin.  MEDICATIONS:  Current Outpatient Medications  Medication Sig Dispense Refill  . albuterol (PROVENTIL HFA;VENTOLIN HFA) 108 (90 Base) MCG/ACT inhaler Inhale 2 puffs into the lungs every 6 (six) hours as needed for wheezing or shortness of breath. 1 Inhaler 5  . azithromycin (ZITHROMAX) 250 MG tablet Take 2 tabs on day 1 and then 1 tab daily until complete. 6 each 0  . budesonide-formoterol (SYMBICORT) 160-4.5 MCG/ACT inhaler Inhale 2 puffs into the lungs 2 (two) times daily. 1 Inhaler 12  . buPROPion (WELLBUTRIN SR) 150 MG 12 hr tablet TAKE 1 TABLET BY MOUTH TWICE DAILY **KEEP APPT ON 06/09/16** (Patient taking differently: Take 150 mg by mouth daily. TAKE 1 TABLET BY MOUTH TWICE DAILY **KEEP APPT ON 06/09/16**) 180 tablet 0  . calcium carbonate (TUMS - DOSED IN MG ELEMENTAL CALCIUM) 500 MG chewable tablet Chew 1 tablet by mouth 2 (two) times a week.     . diltiazem (CARDIZEM CD) 180 MG 24 hr capsule TAKE ONE CAPSULE BY MOUTH EVERY DAY 90 capsule 1  . flecainide (TAMBOCOR) 50 MG tablet TAKE 1 TABLET (50 MG TOTAL) BY MOUTH 2 (TWO) TIMES DAILY. 60 tablet 2  . HYDROcodone-acetaminophen (NORCO) 7.5-325 MG tablet One tablet up to three times a day as needed 90 tablet 0  . ketoconazole (NIZORAL) 2 % cream Apply BID for rash 30 g 2  . lidocaine-prilocaine (EMLA)  cream Apply 1 application as needed topically. 30 g 2  . metoprolol tartrate (LOPRESSOR) 25 MG tablet TAKE 1 TABLET AS NEEDED FOR HEART RACING 10 tablet 5  . pantoprazole (PROTONIX) 40 MG tablet Take 1 tablet (40 mg total) by mouth every morning. 90 tablet 1  . prochlorperazine (COMPAZINE) 10 MG tablet Take 1 tablet (10 mg total) by mouth every 6 (six) hours as needed for nausea or vomiting. 30 tablet 0  . promethazine (PHENERGAN) 25 MG tablet TAKE 1 TABLET BY MOUTH EVERY 6 HOURS AS NEEDED FOR NAUSEA/VOMITING 30 tablet 0   . sucralfate (CARAFATE) 1 g tablet Take 1 tablet (1 g total) by mouth 2 (two) times daily. 60 tablet 0  . warfarin (COUMADIN) 5 MG tablet Take 0-2.5 mg daily by mouth. Take 2.5 mg Daily except none on Monday and Thursday.     No current facility-administered medications for this visit.     SURGICAL HISTORY:  Past Surgical History:  Procedure Laterality Date  . ANKLE SURGERY     x2  . APPENDECTOMY    . CARPAL TUNNEL RELEASE    . CHOLECYSTECTOMY    . IR FLUORO GUIDE PORT INSERTION RIGHT  03/19/2017  . IR US GUIDE VASC ACCESS RIGHT  03/19/2017  . OVARIAN CYST REMOVAL    . ROTATOR CUFF REPAIR    . TUBAL LIGATION    . VIDEO BRONCHOSCOPY WITH ENDOBRONCHIAL ULTRASOUND N/A 09/14/2016   Procedure: VIDEO BRONCHOSCOPY WITH ENDOBRONCHIAL ULTRASOUND;  Surgeon: Melrose Nakayama, MD;  Location: [facility] OR;  Service: Thoracic;  Laterality: N/A;    REVIEW OF SYSTEMS:   Review of Systems  Constitutional: Negative for appetite change, chills, fatigue, fever and unexpected weight change.  HENT:   Negative for mouth sores, nosebleeds, sore throat and trouble swallowing.   Eyes: Negative for eye problems and icterus.  Respiratory: Negative for hemoptysis, shortness of breath and wheezing.  Cough with green sputum production. Cardiovascular: Negative for chest pain and leg swelling.  Gastrointestinal: Negative for abdominal pain, constipation, diarrhea, nausea and vomiting.  Genitourinary: Negative for bladder incontinence, difficulty urinating, dysuria, frequency and hematuria.   Musculoskeletal: Negative for back pain, neck pain and neck stiffness.  Skin: Negative for itching and rash.  Neurological: Negative for dizziness, extremity weakness, headaches, light-headedness and seizures.  Hematological: Negative for adenopathy. Does not bruise/bleed easily.  Psychiatric/Behavioral: Negative for confusion, depression and sleep disturbance. The patient is not nervous/anxious.     PHYSICAL EXAMINATION:   Blood pressure (!) 122/54, pulse 65, temperature 98.2 F (36.8 C), temperature source Oral, resp. rate 20, height 5\' 7"  (1.702 m), weight 238 lb 14.4 oz (108.4 kg), SpO2 99 %.  ECOG PERFORMANCE STATUS: 1 - Symptomatic but completely ambulatory  Physical Exam  Constitutional: Oriented to person, place, and time and well-developed, well-nourished, and in no distress. No distress.  HENT:  Head: Normocephalic and atraumatic.  Mouth/Throat: Oropharynx is clear and moist. No oropharyngeal exudate.  Eyes: Conjunctivae are normal. Right eye exhibits no discharge. Left eye exhibits no discharge. No scleral icterus.  Neck: Normal range of motion. Neck supple.  Cardiovascular: Normal rate, regular rhythm, normal heart sounds and intact distal pulses.   Pulmonary/Chest: Effort normal and breath sounds normal. No respiratory distress. No wheezes. No rales.  Abdominal: Soft. Bowel sounds are normal. Exhibits no distension and no mass. There is no tenderness.  Musculoskeletal: Normal range of motion. Exhibits no edema.  Lymphadenopathy:    No cervical adenopathy.  Neurological: Alert and oriented to person, place,  and time. Exhibits normal muscle tone. Coordination normal.  Skin: Skin is warm and dry. No rash noted. Not diaphoretic. No erythema. No pallor.  Psychiatric: Mood, memory and judgment normal.  Vitals reviewed.  LABORATORY DATA: Lab Results  Component Value Date   WBC 5.0 07/18/2017   HGB 12.9 07/18/2017   HCT 39.3 07/18/2017   MCV 92.1 07/18/2017   PLT 179 07/18/2017      Chemistry      Component Value Date/Time   NA 142 07/18/2017 1152   NA 142 04/26/2017 1106   K 4.0 07/18/2017 1152   K 3.4 (L) 04/26/2017 1106   CL 106 07/18/2017 1152   CO2 28 07/18/2017 1152   CO2 29 04/26/2017 1106   BUN 9 07/18/2017 1152   BUN 7.5 04/26/2017 1106   CREATININE 0.92 07/18/2017 1152   CREATININE 0.9 04/26/2017 1106      Component Value Date/Time   CALCIUM 9.3 07/18/2017 1152    CALCIUM 9.1 04/26/2017 1106   ALKPHOS 91 07/18/2017 1152   ALKPHOS 99 04/26/2017 1106   AST 16 07/18/2017 1152   AST 14 04/26/2017 1106   ALT 15 07/18/2017 1152   ALT 12 04/26/2017 1106   BILITOT 0.3 07/18/2017 1152   BILITOT 0.28 04/26/2017 1106       RADIOGRAPHIC STUDIES:  No results found.   ASSESSMENT/PLAN:  Squamous cell carcinoma of lung, left (HCC) This is a very pleasant 67 year old with unresectable a stage IIIAnon-small cell lung cancer. The patient underwent a course of concurrent chemoradiation with weekly carboplatin and paclitaxel status post 8 cycles and has been tolerating this treatment fairly well except for mild fatigue and odynophagia. The patient is currently undergoing consolidation immunotherapy with Imfinzi (Durvalumab) status post10cycles. The patient continues to tolerate her treatment well with no concerning complaints. I recommended for her to proceed with cycle #11 today as a scheduled. I will see her back for follow-up visit in 2 weeks for evaluation before starting cycle #12.  For her increased cough with green sputum production, she is prone to getting pneumonia.  The patient has underlying COPD.  I have prescribed a Z-Pak for her.  She was advised to call us if she develops any increase in her cough, fevers, or increasing shortness of breath.  .  She was advised to call immediately if she has any concerning symptoms in the interval. The patient voices understanding of current disease status and treatment options and is in agreement with the current care plan. All questions were answered. The patient knows to call the clinic with any problems, questions or concerns. We can certainly see the patient much sooner if necessary.  No orders of the defined types were placed in this encounter.  Sarah Bussing, DNP, AGPCNP-BC, AOCNP 07/18/17

## 2017-07-19 ENCOUNTER — Telehealth: Payer: Self-pay | Admitting: Oncology

## 2017-07-19 NOTE — Telephone Encounter (Signed)
3 cycles already scheduled per 3/20 los - no additional appts added

## 2017-07-24 ENCOUNTER — Telehealth: Payer: Self-pay | Admitting: Family Medicine

## 2017-07-24 ENCOUNTER — Other Ambulatory Visit: Payer: Self-pay | Admitting: *Deleted

## 2017-07-24 MED ORDER — FIRST-DUKES MOUTHWASH MT SUSP: OROMUCOSAL | 0 refills | Status: DC

## 2017-07-24 NOTE — Telephone Encounter (Signed)
Due here for Inr check on 08/03/2017. Please advise any sooner?

## 2017-07-24 NOTE — Telephone Encounter (Signed)
Med sent to pharm. Pt notified.  

## 2017-07-24 NOTE — Telephone Encounter (Signed)
Pt called stating that she is currently on antibiotics and the oncologist wanted her to call to see if she needed her INR checked. Pt is also needing something called in for thrush.   [facility]

## 2017-07-24 NOTE — Telephone Encounter (Signed)
D ukes mmw 16 oz one tblsp swish and spit qid

## 2017-07-24 NOTE — Telephone Encounter (Signed)
Sure later this wk

## 2017-07-24 NOTE — Telephone Encounter (Signed)
Spoke with patient; she believes her inhaler has gave her thrush. Symptoms include tongue being sore, burning, dry and raised areas on tongue. States she probably can not make it in this week but she can first of next week for her INR check. Please advise. Thank you.

## 2017-07-30 ENCOUNTER — Other Ambulatory Visit: Payer: Self-pay | Admitting: Cardiology

## 2017-08-01 ENCOUNTER — Inpatient Hospital Stay: Payer: Medicare Other

## 2017-08-01 ENCOUNTER — Encounter: Payer: Self-pay | Admitting: Oncology

## 2017-08-01 ENCOUNTER — Inpatient Hospital Stay: Payer: Medicare Other | Attending: Internal Medicine

## 2017-08-01 ENCOUNTER — Inpatient Hospital Stay (HOSPITAL_BASED_OUTPATIENT_CLINIC_OR_DEPARTMENT_OTHER): Payer: Medicare Other | Admitting: Oncology

## 2017-08-01 ENCOUNTER — Telehealth: Payer: Self-pay | Admitting: Oncology

## 2017-08-01 ENCOUNTER — Other Ambulatory Visit: Payer: Self-pay | Admitting: Oncology

## 2017-08-01 VITALS — BP 102/44 | HR 75 | Temp 97.7°F | Resp 18 | Ht 67.0 in | Wt 236.1 lb

## 2017-08-01 DIAGNOSIS — M549 Dorsalgia, unspecified: Secondary | ICD-10-CM | POA: Insufficient documentation

## 2017-08-01 DIAGNOSIS — C3492 Malignant neoplasm of unspecified part of left bronchus or lung: Secondary | ICD-10-CM

## 2017-08-01 DIAGNOSIS — Z7901 Long term (current) use of anticoagulants: Secondary | ICD-10-CM | POA: Insufficient documentation

## 2017-08-01 DIAGNOSIS — Z86711 Personal history of pulmonary embolism: Secondary | ICD-10-CM

## 2017-08-01 DIAGNOSIS — Z5112 Encounter for antineoplastic immunotherapy: Secondary | ICD-10-CM

## 2017-08-01 DIAGNOSIS — K219 Gastro-esophageal reflux disease without esophagitis: Secondary | ICD-10-CM

## 2017-08-01 DIAGNOSIS — I480 Paroxysmal atrial fibrillation: Secondary | ICD-10-CM

## 2017-08-01 DIAGNOSIS — R599 Enlarged lymph nodes, unspecified: Secondary | ICD-10-CM | POA: Diagnosis not present

## 2017-08-01 DIAGNOSIS — M4800 Spinal stenosis, site unspecified: Secondary | ICD-10-CM | POA: Diagnosis not present

## 2017-08-01 DIAGNOSIS — Z9221 Personal history of antineoplastic chemotherapy: Secondary | ICD-10-CM | POA: Insufficient documentation

## 2017-08-01 DIAGNOSIS — Z79899 Other long term (current) drug therapy: Secondary | ICD-10-CM | POA: Diagnosis not present

## 2017-08-01 DIAGNOSIS — C3432 Malignant neoplasm of lower lobe, left bronchus or lung: Secondary | ICD-10-CM | POA: Insufficient documentation

## 2017-08-01 DIAGNOSIS — G4730 Sleep apnea, unspecified: Secondary | ICD-10-CM

## 2017-08-01 DIAGNOSIS — E669 Obesity, unspecified: Secondary | ICD-10-CM

## 2017-08-01 DIAGNOSIS — R5382 Chronic fatigue, unspecified: Secondary | ICD-10-CM

## 2017-08-01 DIAGNOSIS — F419 Anxiety disorder, unspecified: Secondary | ICD-10-CM

## 2017-08-01 DIAGNOSIS — Z923 Personal history of irradiation: Secondary | ICD-10-CM | POA: Insufficient documentation

## 2017-08-01 DIAGNOSIS — Z86718 Personal history of other venous thrombosis and embolism: Secondary | ICD-10-CM | POA: Insufficient documentation

## 2017-08-01 DIAGNOSIS — M129 Arthropathy, unspecified: Secondary | ICD-10-CM | POA: Insufficient documentation

## 2017-08-01 DIAGNOSIS — Z95828 Presence of other vascular implants and grafts: Secondary | ICD-10-CM

## 2017-08-01 LAB — COMPREHENSIVE METABOLIC PANEL
ALK PHOS: 96 U/L (ref 40–150)
ALT: 11 U/L (ref 0–55)
AST: 13 U/L (ref 5–34)
Albumin: 3.5 g/dL (ref 3.5–5.0)
Anion gap: 8 (ref 3–11)
BILIRUBIN TOTAL: 0.3 mg/dL (ref 0.2–1.2)
BUN: 10 mg/dL (ref 7–26)
CALCIUM: 9.5 mg/dL (ref 8.4–10.4)
CO2: 28 mmol/L (ref 22–29)
CREATININE: 0.98 mg/dL (ref 0.60–1.10)
Chloride: 106 mmol/L (ref 98–109)
GFR, EST NON AFRICAN AMERICAN: 59 mL/min — AB (ref >60)
Glucose, Bld: 101 mg/dL (ref 70–140)
Potassium: 3.7 mmol/L (ref 3.5–5.1)
SODIUM: 142 mmol/L (ref 136–145)
TOTAL PROTEIN: 6.7 g/dL (ref 6.4–8.3)

## 2017-08-01 LAB — CBC WITH DIFFERENTIAL/PLATELET
BASOS ABS: 0 10*3/uL (ref 0.0–0.1)
BASOS PCT: 0 %
EOS ABS: 0.1 10*3/uL (ref 0.0–0.5)
Eosinophils Relative: 2 %
HEMATOCRIT: 42.1 % (ref 34.8–46.6)
Hemoglobin: 13.4 g/dL (ref 11.6–15.9)
Lymphocytes Relative: 11 %
Lymphs Abs: 0.6 10*3/uL — ABNORMAL LOW (ref 0.9–3.3)
MCH: 30.2 pg (ref 25.1–34.0)
MCHC: 31.8 g/dL (ref 31.5–36.0)
MCV: 94.8 fL (ref 79.5–101.0)
Monocytes Absolute: 0.5 10*3/uL (ref 0.1–0.9)
Monocytes Relative: 9 %
NEUTROS PCT: 78 %
Neutro Abs: 4 10*3/uL (ref 1.5–6.5)
Platelets: 176 10*3/uL (ref 145–400)
RBC: 4.44 MIL/uL (ref 3.70–5.45)
RDW: 14.4 % (ref 11.2–14.5)
WBC: 5.1 10*3/uL (ref 3.9–10.3)

## 2017-08-01 LAB — TSH: TSH: 1.442 u[IU]/mL (ref 0.308–3.960)

## 2017-08-01 MED ORDER — SODIUM CHLORIDE 0.9 % IV SOLN
Freq: Once | INTRAVENOUS | Status: AC
  Administered 2017-08-01: 12:00:00 via INTRAVENOUS

## 2017-08-01 MED ORDER — HEPARIN SOD (PORK) LOCK FLUSH 100 UNIT/ML IV SOLN
500.0000 [IU] | Freq: Once | INTRAVENOUS | Status: AC | PRN
  Administered 2017-08-01: 500 [IU]
  Filled 2017-08-01: qty 5

## 2017-08-01 MED ORDER — SODIUM CHLORIDE 0.9 % IV SOLN
10.2000 mg/kg | Freq: Once | INTRAVENOUS | Status: AC
  Administered 2017-08-01: 1120 mg via INTRAVENOUS
  Filled 2017-08-01: qty 20

## 2017-08-01 MED ORDER — SODIUM CHLORIDE 0.9% FLUSH
10.0000 mL | INTRAVENOUS | Status: DC | PRN
  Administered 2017-08-01: 10 mL
  Filled 2017-08-01: qty 10

## 2017-08-01 MED ORDER — SODIUM CHLORIDE 0.9% FLUSH
10.0000 mL | INTRAVENOUS | Status: DC | PRN
  Administered 2017-08-01: 10 mL via INTRAVENOUS
  Filled 2017-08-01: qty 10

## 2017-08-01 NOTE — Progress Notes (Signed)
[place] OFFICE PROGRESS NOTE  [name], MD [place] [address]  DIAGNOSIS: Unresectable stage IIIA(T4, N0, M0) non-small cell lung cancer, squamous cell carcinoma diagnosed in May 2018 and presented with large obstructing left lower lobe lung mass suspicious small mediastinal lymphadenopathy.  PRIOR THERAPY: A course of concurrent chemoradiation with weekly carboplatin for AUC of 2 and paclitaxel 45 MG/M2. Status post 5 cycles. She received her radiation in [facility]. Last dose of chemotherapy was given 11/27/2016.  CURRENT THERAPY: Consolidation immunotherapy with Imfinzi (Durvalumab) 10 MG/KG every 2 weeks, first dose 02/21/2017. Status post11cycles.  INTERVAL HISTORY: Sarah [name] 67 y.o. female returns for routine follow-up visit accompanied by her husband.  The patient is feeling fine today with no specific complaints.  Her congested cough has improved since completing a course of antibiotics.  She denies fevers and chills.  Denies chest pain, cough, hemoptysis.  She has her baseline shortness of breath and wears home oxygen.  Denies nausea, vomiting, constipation, diarrhea.  The patient is here for evaluation prior to cycle #12 of her treatment.  MEDICAL HISTORY: Past Medical History:  Diagnosis Date  . Arthritis   . Cancer ([place])    lung cancer  . Chronic anxiety    Patient describes as stress  . Chronic back pain   . DVT (deep venous thrombosis) ([place]) age 67  . GERD (gastroesophageal reflux disease)   . Goals of care, counseling/discussion 09/21/2016  . Headache(784.0)   . Hypoestrogenism   . Lower extremity venous stasis    LLE, chronic  . Obesity   . Odynophagia 11/13/2016  . Paroxysmal atrial fibrillation (HCC)   . PONV (postoperative nausea and vomiting)   . Pulmonary embolism ([place])   . Reflux   . Sleep apnea    wear CPAP  . Spinal stenosis   . Tobacco abuse   . Venous stasis   . Warfarin  anticoagulation     ALLERGIES:  is allergic to aspirin.  MEDICATIONS:  Current Outpatient Medications  Medication Sig Dispense Refill  . albuterol (PROVENTIL HFA;VENTOLIN HFA) 108 (90 Base) MCG/ACT inhaler Inhale 2 puffs into the lungs every 6 (six) hours as needed for wheezing or shortness of breath. 1 Inhaler 5  . budesonide-formoterol (SYMBICORT) 160-4.5 MCG/ACT inhaler Inhale 2 puffs into the lungs 2 (two) times daily. 1 Inhaler 12  . buPROPion (WELLBUTRIN SR) 150 MG 12 hr tablet TAKE 1 TABLET BY MOUTH TWICE DAILY **KEEP APPT ON 06/09/16** (Patient taking differently: Take 150 mg by mouth daily. TAKE 1 TABLET BY MOUTH TWICE DAILY **KEEP APPT ON 06/09/16**) 180 tablet 0  . calcium carbonate (TUMS - DOSED IN MG ELEMENTAL CALCIUM) 500 MG chewable tablet Chew 1 tablet by mouth 2 (two) times a week.     . diltiazem (CARDIZEM CD) 180 MG 24 hr capsule TAKE ONE CAPSULE BY MOUTH EVERY DAY 90 capsule 1  . Diphenhyd-Hydrocort-Nystatin (FIRST-DUKES MOUTHWASH) SUSP One tablespoon swish and spit qid 1 Bottle 0  . flecainide (TAMBOCOR) 50 MG tablet TAKE 1 TABLET (50 MG TOTAL) BY MOUTH 2 (TWO) TIMES DAILY. 60 tablet 0  . HYDROcodone-acetaminophen (NORCO) 7.5-325 MG tablet One tablet up to three times a day as needed 90 tablet 0  . ketoconazole (NIZORAL) 2 % cream Apply BID for rash 30 g 2  . lidocaine-prilocaine (EMLA) cream Apply 1 application as needed topically. 30 g 2  . metoprolol tartrate (LOPRESSOR) 25 MG tablet TAKE 1 TABLET AS NEEDED FOR  HEART RACING 10 tablet 5  . pantoprazole (PROTONIX) 40 MG tablet Take 1 tablet (40 mg total) by mouth every morning. 90 tablet 1  . prochlorperazine (COMPAZINE) 10 MG tablet Take 1 tablet (10 mg total) by mouth every 6 (six) hours as needed for nausea or vomiting. 30 tablet 0  . promethazine (PHENERGAN) 25 MG tablet TAKE 1 TABLET BY MOUTH EVERY 6 HOURS AS NEEDED FOR NAUSEA/VOMITING 30 tablet 0  . sucralfate (CARAFATE) 1 g tablet Take 1 tablet (1 g total) by mouth  2 (two) times daily. 60 tablet 0  . warfarin (COUMADIN) 5 MG tablet Take 0-2.5 mg daily by mouth. Take 2.5 mg Daily except none on Monday and Thursday.     No current facility-administered medications for this visit.    Facility-Administered Medications Ordered in Other Visits  Medication Dose Route Frequency Provider Last Rate Last Dose  . 0.9 %  sodium chloride infusion   Intravenous Once Curt Bears, MD      . durvalumab [facility]) 1,120 mg in sodium chloride 0.9 % 100 mL chemo infusion  10.2 mg/kg (Treatment Plan Recorded) Intravenous Once Curt Bears, MD      . heparin lock flush 100 unit/mL  500 Units Intracatheter Once PRN Curt Bears, MD      . sodium chloride flush (NS) 0.9 % injection 10 mL  10 mL Intracatheter PRN Curt Bears, MD        SURGICAL HISTORY:  Past Surgical History:  Procedure Laterality Date  . ANKLE SURGERY     x2  . APPENDECTOMY    . CARPAL TUNNEL RELEASE    . CHOLECYSTECTOMY    . IR FLUORO GUIDE PORT INSERTION RIGHT  03/19/2017  . IR US GUIDE VASC ACCESS RIGHT  03/19/2017  . OVARIAN CYST REMOVAL    . ROTATOR CUFF REPAIR    . TUBAL LIGATION    . VIDEO BRONCHOSCOPY WITH ENDOBRONCHIAL ULTRASOUND N/A 09/14/2016   Procedure: VIDEO BRONCHOSCOPY WITH ENDOBRONCHIAL ULTRASOUND;  Surgeon: Melrose Nakayama, MD;  Location: [facility] OR;  Service: Thoracic;  Laterality: N/A;    REVIEW OF SYSTEMS:   Review of Systems  Constitutional: Negative for appetite change, chills, fatigue, fever and unexpected weight change.  HENT:   Negative for mouth sores, nosebleeds, sore throat and trouble swallowing.   Eyes: Negative for eye problems and icterus.  Respiratory: Negative for cough, hemoptysis, and wheezing.  She has her baseline shortness of breath and wears home oxygen.  Cardiovascular: Negative for chest pain and leg swelling.  Gastrointestinal: Negative for abdominal pain, constipation, diarrhea, nausea and vomiting.  Genitourinary: Negative for bladder  incontinence, difficulty urinating, dysuria, frequency and hematuria.   Musculoskeletal: Negative for back pain, neck pain and neck stiffness.  Skin: Negative for itching and rash.  Neurological: Negative for dizziness, extremity weakness, headaches, light-headedness and seizures.  Hematological: Negative for adenopathy. Does not bruise/bleed easily.  Psychiatric/Behavioral: Negative for confusion, depression and sleep disturbance. The patient is not nervous/anxious.     PHYSICAL EXAMINATION:  Blood pressure (!) 102/44, pulse 75, temperature 97.7 F (36.5 C), temperature source Oral, resp. rate 18, height 5\' 7"  (1.702 m), weight 236 lb 1.6 oz (107.1 kg), SpO2 99 %.  ECOG PERFORMANCE STATUS: 1 - Symptomatic but completely ambulatory  Physical Exam  Constitutional: Oriented to person, place, and time and well-developed, well-nourished, and in no distress. No distress.  HENT:  Head: Normocephalic and atraumatic.  Mouth/Throat: Oropharynx is clear and moist. No oropharyngeal exudate.  Eyes: Conjunctivae are normal. Right  eye exhibits no discharge. Left eye exhibits no discharge. No scleral icterus.  Neck: Normal range of motion. Neck supple.  Cardiovascular: Normal rate, regular rhythm, normal heart sounds and intact distal pulses.   Pulmonary/Chest: Effort normal and breath sounds normal. No respiratory distress. No wheezes. No rales.  Abdominal: Soft. Bowel sounds are normal. Exhibits no distension and no mass. There is no tenderness.  Musculoskeletal: Normal range of motion. Exhibits no edema.  Lymphadenopathy:    No cervical adenopathy.  Neurological: Alert and oriented to person, place, and time. Exhibits normal muscle tone. Coordination normal.  Skin: Skin is warm and dry. No rash noted. Not diaphoretic. No erythema. No pallor.  Psychiatric: Mood, memory and judgment normal.  Vitals reviewed.  LABORATORY DATA: Lab Results  Component Value Date   WBC 5.1 08/01/2017   HGB 13.4  08/01/2017   HCT 42.1 08/01/2017   MCV 94.8 08/01/2017   PLT 176 08/01/2017      Chemistry      Component Value Date/Time   NA 142 08/01/2017 1101   NA 142 04/26/2017 1106   K 3.7 08/01/2017 1101   K 3.4 (L) 04/26/2017 1106   CL 106 08/01/2017 1101   CO2 28 08/01/2017 1101   CO2 29 04/26/2017 1106   BUN 10 08/01/2017 1101   BUN 7.5 04/26/2017 1106   CREATININE 0.98 08/01/2017 1101   CREATININE 0.9 04/26/2017 1106      Component Value Date/Time   CALCIUM 9.5 08/01/2017 1101   CALCIUM 9.1 04/26/2017 1106   ALKPHOS 96 08/01/2017 1101   ALKPHOS 99 04/26/2017 1106   AST 13 08/01/2017 1101   AST 14 04/26/2017 1106   ALT 11 08/01/2017 1101   ALT 12 04/26/2017 1106   BILITOT 0.3 08/01/2017 1101   BILITOT 0.28 04/26/2017 1106       RADIOGRAPHIC STUDIES:  No results found.   ASSESSMENT/PLAN:  Squamous cell carcinoma of lung, left (HCC) This is a very pleasant 67 year old with unresectable a stage IIIAnon-small cell lung cancer. The patient underwent a course of concurrent chemoradiation with weekly carboplatin and paclitaxel status post 8 cycles and has been tolerating this treatment fairly well except for mild fatigue and odynophagia. The patient is currently undergoing consolidation immunotherapy with Imfinzi (Durvalumab) status post11cycles. The patient continues to tolerate her treatment well with no concerning complaints. I recommended for her to proceed with cycle #12today as a scheduled. The patient will have a restaging CT scan of the chest prior to her next visit. I will see her back for follow-up visit in 2 weeks for evaluation before starting cycle #13 and to review her restaging CT scan results.  She was advised to call immediately if she has any concerning symptoms in the interval. The patient voices understanding of current disease status and treatment options and is in agreement with the current care plan. All questions were answered. The patient knows  to call the clinic with any problems, questions or concerns. We can certainly see the patient much sooner if necessary.   Orders Placed This Encounter  Procedures  . CT CHEST W CONTRAST    Standing Status:   Future    Standing Expiration Date:   08/02/2018    Order Specific Question:   If indicated for the ordered procedure, I authorize the administration of contrast media per Radiology protocol    Answer:   Yes    Order Specific Question:   Preferred imaging location?    Answer:   Deneise Lever  [facility]    Order Specific Question:   Radiology Contrast Protocol - do NOT remove file path    Answer:   [email or web address]    Order Specific Question:   Reason for Exam additional comments    Answer:   Lung cancer. Restaging.   Mikey Bussing, DNP, AGPCNP-BC, AOCNP 08/01/17

## 2017-08-01 NOTE — Patient Instructions (Signed)
[facility] Discharge Instructions for Patients Receiving Chemotherapy  Today you received the following chemotherapy agents: Imfinzi.  To help prevent nausea and vomiting after your treatment, we encourage you to take your nausea medication as directed.   If you develop nausea and vomiting that is not controlled by your nausea medication, call the clinic.   BELOW ARE SYMPTOMS THAT SHOULD BE REPORTED IMMEDIATELY:  *FEVER GREATER THAN 100.5 F  *CHILLS WITH OR WITHOUT FEVER  NAUSEA AND VOMITING THAT IS NOT CONTROLLED WITH YOUR NAUSEA MEDICATION  *UNUSUAL SHORTNESS OF BREATH  *UNUSUAL BRUISING OR BLEEDING  TENDERNESS IN MOUTH AND THROAT WITH OR WITHOUT PRESENCE OF ULCERS  *URINARY PROBLEMS  *BOWEL PROBLEMS  UNUSUAL RASH Items with * indicate a potential emergency and should be followed up as soon as possible.  Feel free to call the clinic should you have any questions or concerns. The clinic phone number is [phone].  Please show the CHEMO ALERT CARD at check-in to the Emergency Department and triage nurse.   

## 2017-08-01 NOTE — Telephone Encounter (Signed)
Scheduled appt per 4/3 los - patient to get an updated schedule next visit.

## 2017-08-01 NOTE — Assessment & Plan Note (Signed)
This is a very pleasant 67 year old with unresectable a stage IIIAnon-small cell lung cancer. The patient underwent a course of concurrent chemoradiation with weekly carboplatin and paclitaxel status post 8 cycles and has been tolerating this treatment fairly well except for mild fatigue and odynophagia. The patient is currently undergoing consolidation immunotherapy with Imfinzi (Durvalumab) status post11cycles. The patient continues to tolerate her treatment well with no concerning complaints. I recommended for her to proceed with cycle #12today as a scheduled. The patient will have a restaging CT scan of the chest prior to her next visit. I will see her back for follow-up visit in 2 weeks for evaluation before starting cycle #13 and to review her restaging CT scan results.  She was advised to call immediately if she has any concerning symptoms in the interval. The patient voices understanding of current disease status and treatment options and is in agreement with the current care plan. All questions were answered. The patient knows to call the clinic with any problems, questions or concerns. We can certainly see the patient much sooner if necessary.

## 2017-08-03 ENCOUNTER — Ambulatory Visit (INDEPENDENT_AMBULATORY_CARE_PROVIDER_SITE_OTHER): Payer: Medicare Other

## 2017-08-03 DIAGNOSIS — Z7901 Long term (current) use of anticoagulants: Secondary | ICD-10-CM

## 2017-08-03 LAB — POCT INR: INR: 2.3

## 2017-08-03 NOTE — Patient Instructions (Signed)
Take 1/2 tab each day except Wednesday; On Wednesday take one tablet. Re check in 4 weeks. Make sure to keep follow up appts with Dr. Richardson Landry

## 2017-08-09 ENCOUNTER — Other Ambulatory Visit: Payer: Self-pay | Admitting: Cardiology

## 2017-08-13 ENCOUNTER — Encounter (HOSPITAL_COMMUNITY): Payer: Self-pay

## 2017-08-13 ENCOUNTER — Ambulatory Visit (HOSPITAL_COMMUNITY)
Admission: RE | Admit: 2017-08-13 | Discharge: 2017-08-13 | Disposition: A | Discharge status: Home / Self Care | Payer: Medicare Other | Source: Ambulatory Visit | Attending: Oncology | Admitting: Oncology

## 2017-08-13 DIAGNOSIS — J439 Emphysema, unspecified: Secondary | ICD-10-CM | POA: Insufficient documentation

## 2017-08-13 DIAGNOSIS — C3490 Malignant neoplasm of unspecified part of unspecified bronchus or lung: Secondary | ICD-10-CM | POA: Diagnosis not present

## 2017-08-13 DIAGNOSIS — J90 Pleural effusion, not elsewhere classified: Secondary | ICD-10-CM | POA: Insufficient documentation

## 2017-08-13 DIAGNOSIS — D3501 Benign neoplasm of right adrenal gland: Secondary | ICD-10-CM | POA: Diagnosis not present

## 2017-08-13 DIAGNOSIS — I700 Atherosclerosis of aorta: Secondary | ICD-10-CM | POA: Insufficient documentation

## 2017-08-13 DIAGNOSIS — C3492 Malignant neoplasm of unspecified part of left bronchus or lung: Secondary | ICD-10-CM | POA: Diagnosis not present

## 2017-08-13 MED ORDER — IOHEXOL 300 MG/ML  SOLN
75.0000 mL | Freq: Once | INTRAMUSCULAR | Status: AC | PRN
  Administered 2017-08-13: 75 mL via INTRAVENOUS

## 2017-08-15 ENCOUNTER — Telehealth: Payer: Self-pay | Admitting: Internal Medicine

## 2017-08-15 ENCOUNTER — Inpatient Hospital Stay: Payer: Medicare Other

## 2017-08-15 ENCOUNTER — Encounter: Payer: Self-pay | Admitting: Internal Medicine

## 2017-08-15 ENCOUNTER — Inpatient Hospital Stay (HOSPITAL_BASED_OUTPATIENT_CLINIC_OR_DEPARTMENT_OTHER): Payer: Medicare Other | Admitting: Internal Medicine

## 2017-08-15 VITALS — BP 106/46 | HR 67 | Temp 97.7°F | Resp 17 | Ht 67.0 in | Wt 238.5 lb

## 2017-08-15 DIAGNOSIS — Z86711 Personal history of pulmonary embolism: Secondary | ICD-10-CM

## 2017-08-15 DIAGNOSIS — C3432 Malignant neoplasm of lower lobe, left bronchus or lung: Secondary | ICD-10-CM

## 2017-08-15 DIAGNOSIS — I480 Paroxysmal atrial fibrillation: Secondary | ICD-10-CM | POA: Diagnosis not present

## 2017-08-15 DIAGNOSIS — Z9221 Personal history of antineoplastic chemotherapy: Secondary | ICD-10-CM | POA: Diagnosis not present

## 2017-08-15 DIAGNOSIS — Z79899 Other long term (current) drug therapy: Secondary | ICD-10-CM

## 2017-08-15 DIAGNOSIS — E669 Obesity, unspecified: Secondary | ICD-10-CM

## 2017-08-15 DIAGNOSIS — Z923 Personal history of irradiation: Secondary | ICD-10-CM

## 2017-08-15 DIAGNOSIS — G4730 Sleep apnea, unspecified: Secondary | ICD-10-CM

## 2017-08-15 DIAGNOSIS — Z5112 Encounter for antineoplastic immunotherapy: Secondary | ICD-10-CM

## 2017-08-15 DIAGNOSIS — F419 Anxiety disorder, unspecified: Secondary | ICD-10-CM | POA: Diagnosis not present

## 2017-08-15 DIAGNOSIS — M4800 Spinal stenosis, site unspecified: Secondary | ICD-10-CM | POA: Diagnosis not present

## 2017-08-15 DIAGNOSIS — M549 Dorsalgia, unspecified: Secondary | ICD-10-CM | POA: Diagnosis not present

## 2017-08-15 DIAGNOSIS — R599 Enlarged lymph nodes, unspecified: Secondary | ICD-10-CM | POA: Diagnosis not present

## 2017-08-15 DIAGNOSIS — C3492 Malignant neoplasm of unspecified part of left bronchus or lung: Secondary | ICD-10-CM

## 2017-08-15 DIAGNOSIS — K219 Gastro-esophageal reflux disease without esophagitis: Secondary | ICD-10-CM | POA: Diagnosis not present

## 2017-08-15 DIAGNOSIS — Z95828 Presence of other vascular implants and grafts: Secondary | ICD-10-CM

## 2017-08-15 DIAGNOSIS — Z7901 Long term (current) use of anticoagulants: Secondary | ICD-10-CM

## 2017-08-15 DIAGNOSIS — M129 Arthropathy, unspecified: Secondary | ICD-10-CM

## 2017-08-15 DIAGNOSIS — Z86718 Personal history of other venous thrombosis and embolism: Secondary | ICD-10-CM

## 2017-08-15 LAB — COMPREHENSIVE METABOLIC PANEL
ALT: 15 U/L (ref 0–55)
ANION GAP: 7 (ref 3–11)
AST: 15 U/L (ref 5–34)
Albumin: 3.8 g/dL (ref 3.5–5.0)
Alkaline Phosphatase: 97 U/L (ref 40–150)
BUN: 9 mg/dL (ref 7–26)
CALCIUM: 9.5 mg/dL (ref 8.4–10.4)
CHLORIDE: 104 mmol/L (ref 98–109)
CO2: 31 mmol/L — AB (ref 22–29)
Creatinine, Ser: 1.04 mg/dL (ref 0.60–1.10)
GFR calc non Af Amer: 55 mL/min — ABNORMAL LOW (ref >60)
Glucose, Bld: 95 mg/dL (ref 70–140)
POTASSIUM: 4.6 mmol/L (ref 3.5–5.1)
SODIUM: 142 mmol/L (ref 136–145)
Total Bilirubin: 0.3 mg/dL (ref 0.2–1.2)
Total Protein: 7 g/dL (ref 6.4–8.3)

## 2017-08-15 LAB — CBC WITH DIFFERENTIAL/PLATELET
BASOS PCT: 1 %
Basophils Absolute: 0 10*3/uL (ref 0.0–0.1)
Eosinophils Absolute: 0.1 10*3/uL (ref 0.0–0.5)
Eosinophils Relative: 3 %
HEMATOCRIT: 41.1 % (ref 34.8–46.6)
HEMOGLOBIN: 13.4 g/dL (ref 11.6–15.9)
LYMPHS PCT: 11 %
Lymphs Abs: 0.6 10*3/uL — ABNORMAL LOW (ref 0.9–3.3)
MCH: 30.2 pg (ref 25.1–34.0)
MCHC: 32.7 g/dL (ref 31.5–36.0)
MCV: 92.5 fL (ref 79.5–101.0)
MONOS PCT: 9 %
Monocytes Absolute: 0.5 10*3/uL (ref 0.1–0.9)
NEUTROS ABS: 4 10*3/uL (ref 1.5–6.5)
NEUTROS PCT: 76 %
Platelets: 184 10*3/uL (ref 145–400)
RBC: 4.44 MIL/uL (ref 3.70–5.45)
RDW: 15 % — ABNORMAL HIGH (ref 11.2–14.5)
WBC: 5.2 10*3/uL (ref 3.9–10.3)

## 2017-08-15 MED ORDER — ALTEPLASE 2 MG IJ SOLR
2.0000 mg | Freq: Once | INTRAMUSCULAR | Status: AC | PRN
  Administered 2017-08-15: 2 mg
  Filled 2017-08-15: qty 2

## 2017-08-15 MED ORDER — SODIUM CHLORIDE 0.9 % IV SOLN
Freq: Once | INTRAVENOUS | Status: AC
  Administered 2017-08-15: 13:00:00 via INTRAVENOUS

## 2017-08-15 MED ORDER — SODIUM CHLORIDE 0.9% FLUSH
10.0000 mL | INTRAVENOUS | Status: DC | PRN
Start: 2017-08-15 — End: 2017-08-15
  Administered 2017-08-15: 10 mL via INTRAVENOUS
  Filled 2017-08-15: qty 10

## 2017-08-15 MED ORDER — ALTEPLASE 2 MG IJ SOLR
INTRAMUSCULAR | Status: AC
Start: 2017-08-15 — End: 2017-08-15
  Filled 2017-08-15: qty 2

## 2017-08-15 MED ORDER — SODIUM CHLORIDE 0.9% FLUSH
10.0000 mL | INTRAVENOUS | Status: DC | PRN
  Administered 2017-08-15: 10 mL
  Filled 2017-08-15: qty 10

## 2017-08-15 MED ORDER — HEPARIN SOD (PORK) LOCK FLUSH 100 UNIT/ML IV SOLN
500.0000 [IU] | Freq: Once | INTRAVENOUS | Status: AC | PRN
  Administered 2017-08-15: 500 [IU]
  Filled 2017-08-15: qty 5

## 2017-08-15 MED ORDER — SODIUM CHLORIDE 0.9 % IV SOLN
10.2000 mg/kg | Freq: Once | INTRAVENOUS | Status: AC
  Administered 2017-08-15: 1120 mg via INTRAVENOUS
  Filled 2017-08-15: qty 20

## 2017-08-15 NOTE — Patient Instructions (Signed)
[facility] Discharge Instructions for Patients Receiving Chemotherapy  Today you received the following chemotherapy agents: Imfinzi.  To help prevent nausea and vomiting after your treatment, we encourage you to take your nausea medication as directed.   If you develop nausea and vomiting that is not controlled by your nausea medication, call the clinic.   BELOW ARE SYMPTOMS THAT SHOULD BE REPORTED IMMEDIATELY:  *FEVER GREATER THAN 100.5 F  *CHILLS WITH OR WITHOUT FEVER  NAUSEA AND VOMITING THAT IS NOT CONTROLLED WITH YOUR NAUSEA MEDICATION  *UNUSUAL SHORTNESS OF BREATH  *UNUSUAL BRUISING OR BLEEDING  TENDERNESS IN MOUTH AND THROAT WITH OR WITHOUT PRESENCE OF ULCERS  *URINARY PROBLEMS  *BOWEL PROBLEMS  UNUSUAL RASH Items with * indicate a potential emergency and should be followed up as soon as possible.  Feel free to call the clinic should you have any questions or concerns. The clinic phone number is [phone].  Please show the CHEMO ALERT CARD at check-in to the Emergency Department and triage nurse.   

## 2017-08-15 NOTE — Telephone Encounter (Signed)
Next three cycles already scheduled per 4/17 los.

## 2017-08-15 NOTE — Progress Notes (Signed)
[facility] [phone]   [phone]  OFFICE PROGRESS NOTE  Sarah Kirschner, MD [address] [place] [address]  DIAGNOSIS: Unresectable stage IIIA (T4, N0, M0) non-small cell lung cancer, squamous cell carcinoma diagnosed in May 2018 and presented with large obstructing left lower lobe lung mass suspicious small mediastinal lymphadenopathy.  PRIOR THERAPY: A course of concurrent chemoradiation with weekly carboplatin for AUC of 2 and paclitaxel 45 MG/M2. Status post 5 cycles. She received her radiation in [facility]. Last dose of chemotherapy was given 11/27/2016.Marland Kitchen  CURRENT THERAPY: consolidation immunotherapy with Imfinzi (Durvalumab) 10 MG/KG every 2 weeks, first dose 02/21/2017.  Status post 12 cycles.  INTERVAL HISTORY: Sarah [name] 67 y.o. female returns to the clinic today for follow-up visit accompanied by her husband.  The patient is feeling fine today with no specific complaints except for occasional aching pain on the left side of the chest.  She has baseline shortness of breath and currently on home oxygen.  She denied having any hemoptysis.  She denied having any recent weight loss or night sweats.  She has no nausea, vomiting, diarrhea or constipation.  She has no significant skin rash.  She continues to tolerate her treatment with Imfinzi (Durvalumab) fairly well.  She is here for evaluation with repeat CT scan of the chest for restaging of her disease.  MEDICAL HISTORY: Past Medical History:  Diagnosis Date  . Arthritis   . Cancer ([facility])    lung cancer  . Chronic anxiety    Patient describes as stress  . Chronic back pain   . DVT (deep venous thrombosis) ([facility]) age 46  . GERD (gastroesophageal reflux disease)   . Goals of care, counseling/discussion 09/21/2016  . Headache(784.0)   . Hypoestrogenism   . Lower extremity venous stasis    LLE, chronic  . Obesity   . Odynophagia 11/13/2016  . Paroxysmal  atrial fibrillation ([facility])   . PONV (postoperative nausea and vomiting)   . Pulmonary embolism ([facility])   . Reflux   . Sleep apnea    wear CPAP  . Spinal stenosis   . Tobacco abuse   . Venous stasis   . Warfarin anticoagulation     ALLERGIES:  is allergic to aspirin.  MEDICATIONS:  Current Outpatient Medications  Medication Sig Dispense Refill  . albuterol (PROVENTIL HFA;VENTOLIN HFA) 108 (90 Base) MCG/ACT inhaler Inhale 2 puffs into the lungs every 6 (six) hours as needed for wheezing or shortness of breath. 1 Inhaler 5  . budesonide-formoterol (SYMBICORT) 160-4.5 MCG/ACT inhaler Inhale 2 puffs into the lungs 2 (two) times daily. 1 Inhaler 12  . buPROPion (WELLBUTRIN SR) 150 MG 12 hr tablet TAKE 1 TABLET BY MOUTH TWICE DAILY **KEEP APPT ON 06/09/16** (Patient taking differently: Take 150 mg by mouth daily. TAKE 1 TABLET BY MOUTH TWICE DAILY **KEEP APPT ON 06/09/16**) 180 tablet 0  . calcium carbonate (TUMS - DOSED IN MG ELEMENTAL CALCIUM) 500 MG chewable tablet Chew 1 tablet by mouth 2 (two) times a week.     . diltiazem (CARDIZEM CD) 180 MG 24 hr capsule TAKE ONE CAPSULE BY MOUTH EVERY DAY 90 capsule 1  . Diphenhyd-Hydrocort-Nystatin (FIRST-DUKES MOUTHWASH) SUSP One tablespoon swish and spit qid 1 Bottle 0  . flecainide (TAMBOCOR) 50 MG tablet TAKE 1 TABLET (50 MG TOTAL) BY MOUTH 2 (TWO) TIMES DAILY. 60 tablet 0  . flecainide (TAMBOCOR) 50 MG tablet TAKE 1 TABLET (50 MG TOTAL)  BY MOUTH 2 (TWO) TIMES DAILY. 30 tablet 0  . HYDROcodone-acetaminophen (NORCO) 7.5-325 MG tablet One tablet up to three times a day as needed 90 tablet 0  . ketoconazole (NIZORAL) 2 % cream Apply BID for rash 30 g 2  . lidocaine-prilocaine (EMLA) cream Apply 1 application as needed topically. 30 g 2  . metoprolol tartrate (LOPRESSOR) 25 MG tablet TAKE 1 TABLET AS NEEDED FOR HEART RACING 10 tablet 5  . pantoprazole (PROTONIX) 40 MG tablet Take 1 tablet (40 mg total) by mouth every morning. 90 tablet 1  .  prochlorperazine (COMPAZINE) 10 MG tablet Take 1 tablet (10 mg total) by mouth every 6 (six) hours as needed for nausea or vomiting. 30 tablet 0  . promethazine (PHENERGAN) 25 MG tablet TAKE 1 TABLET BY MOUTH EVERY 6 HOURS AS NEEDED FOR NAUSEA/VOMITING 30 tablet 0  . sucralfate (CARAFATE) 1 g tablet Take 1 tablet (1 g total) by mouth 2 (two) times daily. 60 tablet 0  . warfarin (COUMADIN) 5 MG tablet Take 0-2.5 mg daily by mouth. Take 2.5 mg Daily except none on Monday and Thursday.     No current facility-administered medications for this visit.     SURGICAL HISTORY:  Past Surgical History:  Procedure Laterality Date  . ANKLE SURGERY     x2  . APPENDECTOMY    . CARPAL TUNNEL RELEASE    . CHOLECYSTECTOMY    . IR FLUORO GUIDE PORT INSERTION RIGHT  03/19/2017  . IR US GUIDE VASC ACCESS RIGHT  03/19/2017  . OVARIAN CYST REMOVAL    . ROTATOR CUFF REPAIR    . TUBAL LIGATION    . VIDEO BRONCHOSCOPY WITH ENDOBRONCHIAL ULTRASOUND N/A 09/14/2016   Procedure: VIDEO BRONCHOSCOPY WITH ENDOBRONCHIAL ULTRASOUND;  Surgeon: Melrose Nakayama, MD;  Location: [facility];  Service: Thoracic;  Laterality: N/A;    REVIEW OF SYSTEMS:  Constitutional: negative Eyes: negative Ears, nose, mouth, throat, and face: negative Respiratory: positive for cough and dyspnea on exertion Cardiovascular: negative Gastrointestinal: negative Genitourinary:negative Integument/breast: negative Hematologic/lymphatic: negative Musculoskeletal:negative Neurological: negative Behavioral/Psych: negative Endocrine: negative Allergic/Immunologic: negative   PHYSICAL EXAMINATION: General appearance: alert, cooperative and no distress Head: Normocephalic, without obvious abnormality, atraumatic Neck: no adenopathy, no JVD, supple, symmetrical, trachea midline and thyroid not enlarged, symmetric, no tenderness/mass/nodules Lymph nodes: Cervical, supraclavicular, and axillary nodes normal. Resp: clear to auscultation  bilaterally Back: symmetric, no curvature. ROM normal. No CVA tenderness. Cardio: regular rate and rhythm, S1, S2 normal, no murmur, click, rub or gallop GI: soft, non-tender; bowel sounds normal; no masses,  no organomegaly Extremities: extremities normal, atraumatic, no cyanosis or edema Neurologic: Alert and oriented X 3, normal strength and tone. Normal symmetric reflexes. Normal coordination and gait  ECOG PERFORMANCE STATUS: 1 - Symptomatic but completely ambulatory  Blood pressure (!) 106/46, pulse 67, temperature 97.7 F (36.5 C), temperature source Oral, resp. rate 17, height 5\' 7"  (1.702 m), weight 238 lb 8 oz (108.2 kg), SpO2 98 %.  LABORATORY DATA: Lab Results  Component Value Date   WBC 5.2 08/15/2017   HGB 13.4 08/15/2017   HCT 41.1 08/15/2017   MCV 92.5 08/15/2017   PLT 184 08/15/2017      Chemistry      Component Value Date/Time   NA 142 08/01/2017 1101   NA 142 04/26/2017 1106   K 3.7 08/01/2017 1101   K 3.4 (L) 04/26/2017 1106   CL 106 08/01/2017 1101   CO2 28 08/01/2017 1101   CO2 29 04/26/2017 1106  BUN 10 08/01/2017 1101   BUN 7.5 04/26/2017 1106   CREATININE 0.98 08/01/2017 1101   CREATININE 0.9 04/26/2017 1106      Component Value Date/Time   CALCIUM 9.5 08/01/2017 1101   CALCIUM 9.1 04/26/2017 1106   ALKPHOS 96 08/01/2017 1101   ALKPHOS 99 04/26/2017 1106   AST 13 08/01/2017 1101   AST 14 04/26/2017 1106   ALT 11 08/01/2017 1101   ALT 12 04/26/2017 1106   BILITOT 0.3 08/01/2017 1101   BILITOT 0.28 04/26/2017 1106       RADIOGRAPHIC STUDIES: Ct Chest W Contrast  Result Date: 08/13/2017 CLINICAL DATA:  Follow-up lung cancer. EXAM: CT CHEST WITH CONTRAST TECHNIQUE: Multidetector CT imaging of the chest was performed during intravenous contrast administration. CONTRAST:  72mL OMNIPAQUE IOHEXOL 300 MG/ML  SOLN COMPARISON:  05/21/2017 FINDINGS: Cardiovascular: The heart size appears normal. No pericardial effusion. Aortic atherosclerosis.  Calcification within the RCA coronary artery noted. The main pulmonary artery measures 3.2 cm. Mediastinum/Nodes: The trachea appears patent and is midline. Normal appearance of the thyroid gland. Normal appearance of the esophagus. No enlarged mediastinal or hilar lymph nodes. Lungs/Pleura: Mild to moderate changes of emphysema noted. Again noted is significant volume loss from the left lung. Partially loculated left pleural effusion is similar in volume to the previous exam. The central left lower lobe lung mass is again noted. This measures 6.4 by 3.8 by 5.7 cm (volume = 73 cm^3). Previously 6.5 by 3.7 by 6.2 cm (volume = 78 cm^3). Mass-like architectural distortion and fibrosis within the perihilar left upper lobe is similar to previous exam. There has been mild improved aeration to the left lower lobe compared with prior study. Previously referenced 3 mm basilar right lower lobe pulmonary nodule is no longer visualized compatible with an inflammatory or infectious process. Upper Abdomen: Previously characterized adrenal gland adenoma is stable measuring 2.5 cm, image 142/2. Simple appearing cyst noted within the upper pole of right kidney. No acute findings noted within the upper abdomen. Musculoskeletal: Spondylosis identified within the thoracic spine. No suspicious bone lesions. IMPRESSION: 1. The central left lower lobe lung mass is stable to minimally decreased in size in the interval. 2. Similar appearance of moderate loculated left pleural effusion. 3. Previously referenced 3 mm right lower lobe pulmonary nodule has resolved in the interval. 4. Increase caliber of the main pulmonary artery suggestive of PA hypertension. 5. Stable appearance of right adrenal gland adenoma 6. Aortic Atherosclerosis (ICD10-I70.0) and Emphysema (ICD10-J43.9). Electronically Signed   By: Kerby Moors M.D.   On: 08/13/2017 16:17    ASSESSMENT AND PLAN:  This is a very pleasant 67 years old with unresectable a stage IIIA  non-small cell lung cancer. The patient underwent a course of concurrent chemoradiation with weekly carboplatin and paclitaxel status post 8 cycles and has been tolerating this treatment fairly well except for mild fatigue and odynophagia. The patient is currently undergoing consolidation immunotherapy with Imfinzi (Durvalumab) status post 12 cycles. She is tolerating her treatment with Imfinzi (Durvalumab) fairly well. The patient had repeat CT scan of the chest performed recently.  Her scan showed no concerning findings for disease progression but there was stable to minimal decrease in the size of the central left lower lobe lung mass.  The previously identified right lower lobe pulmonary nodule resolved in the interval. I personally and independently reviewed the scans and discussed the results with the patient and her husband.  I recommended for her to continue her current treatment with Imfinzi (  Durvalumab) and she will proceed with cycle #13 today. I will see her back for follow-up visit in 2 weeks for evaluation before the next dose of her treatment. The patient was advised to call immediately if she has any concerning symptoms in the interval. The patient voices understanding of current disease status and treatment options and is in agreement with the current care plan. All questions were answered. The patient knows to call the clinic with any problems, questions or concerns. We can certainly see the patient much sooner if necessary.  Disclaimer: This note was dictated with voice recognition software. Similar sounding words can inadvertently be transcribed and may not be corrected upon review.

## 2017-08-29 ENCOUNTER — Inpatient Hospital Stay: Payer: Medicare Other

## 2017-08-29 ENCOUNTER — Inpatient Hospital Stay (HOSPITAL_BASED_OUTPATIENT_CLINIC_OR_DEPARTMENT_OTHER): Payer: Medicare Other | Admitting: Oncology

## 2017-08-29 ENCOUNTER — Inpatient Hospital Stay: Payer: Medicare Other | Attending: Internal Medicine

## 2017-08-29 ENCOUNTER — Telehealth: Payer: Self-pay | Admitting: Oncology

## 2017-08-29 ENCOUNTER — Encounter: Payer: Self-pay | Admitting: Oncology

## 2017-08-29 VITALS — BP 115/66 | HR 71 | Temp 98.3°F | Resp 18 | Ht 67.0 in | Wt 236.2 lb

## 2017-08-29 DIAGNOSIS — G4730 Sleep apnea, unspecified: Secondary | ICD-10-CM | POA: Diagnosis not present

## 2017-08-29 DIAGNOSIS — C3492 Malignant neoplasm of unspecified part of left bronchus or lung: Secondary | ICD-10-CM

## 2017-08-29 DIAGNOSIS — E669 Obesity, unspecified: Secondary | ICD-10-CM | POA: Diagnosis not present

## 2017-08-29 DIAGNOSIS — F419 Anxiety disorder, unspecified: Secondary | ICD-10-CM | POA: Diagnosis not present

## 2017-08-29 DIAGNOSIS — G8929 Other chronic pain: Secondary | ICD-10-CM | POA: Diagnosis not present

## 2017-08-29 DIAGNOSIS — Z86711 Personal history of pulmonary embolism: Secondary | ICD-10-CM

## 2017-08-29 DIAGNOSIS — Z7901 Long term (current) use of anticoagulants: Secondary | ICD-10-CM | POA: Insufficient documentation

## 2017-08-29 DIAGNOSIS — I700 Atherosclerosis of aorta: Secondary | ICD-10-CM

## 2017-08-29 DIAGNOSIS — Z923 Personal history of irradiation: Secondary | ICD-10-CM

## 2017-08-29 DIAGNOSIS — M129 Arthropathy, unspecified: Secondary | ICD-10-CM

## 2017-08-29 DIAGNOSIS — Z95828 Presence of other vascular implants and grafts: Secondary | ICD-10-CM

## 2017-08-29 DIAGNOSIS — Z5112 Encounter for antineoplastic immunotherapy: Secondary | ICD-10-CM | POA: Insufficient documentation

## 2017-08-29 DIAGNOSIS — I4891 Unspecified atrial fibrillation: Secondary | ICD-10-CM | POA: Diagnosis not present

## 2017-08-29 DIAGNOSIS — K219 Gastro-esophageal reflux disease without esophagitis: Secondary | ICD-10-CM | POA: Insufficient documentation

## 2017-08-29 DIAGNOSIS — M549 Dorsalgia, unspecified: Secondary | ICD-10-CM | POA: Insufficient documentation

## 2017-08-29 DIAGNOSIS — C3432 Malignant neoplasm of lower lobe, left bronchus or lung: Secondary | ICD-10-CM | POA: Insufficient documentation

## 2017-08-29 DIAGNOSIS — Z9221 Personal history of antineoplastic chemotherapy: Secondary | ICD-10-CM | POA: Insufficient documentation

## 2017-08-29 DIAGNOSIS — Z79899 Other long term (current) drug therapy: Secondary | ICD-10-CM | POA: Insufficient documentation

## 2017-08-29 DIAGNOSIS — R5382 Chronic fatigue, unspecified: Secondary | ICD-10-CM

## 2017-08-29 LAB — CBC WITH DIFFERENTIAL/PLATELET
BASOS PCT: 0 %
Basophils Absolute: 0 10*3/uL (ref 0.0–0.1)
Eosinophils Absolute: 0.1 10*3/uL (ref 0.0–0.5)
Eosinophils Relative: 2 %
HEMATOCRIT: 40.7 % (ref 34.8–46.6)
HEMOGLOBIN: 13.3 g/dL (ref 11.6–15.9)
Lymphocytes Relative: 12 %
Lymphs Abs: 0.6 10*3/uL — ABNORMAL LOW (ref 0.9–3.3)
MCH: 31.1 pg (ref 25.1–34.0)
MCHC: 32.7 g/dL (ref 31.5–36.0)
MCV: 95.1 fL (ref 79.5–101.0)
MONOS PCT: 11 %
Monocytes Absolute: 0.5 10*3/uL (ref 0.1–0.9)
NEUTROS ABS: 3.6 10*3/uL (ref 1.5–6.5)
NEUTROS PCT: 75 %
Platelets: 177 10*3/uL (ref 145–400)
RBC: 4.28 MIL/uL (ref 3.70–5.45)
RDW: 14.4 % (ref 11.2–14.5)
WBC: 4.9 10*3/uL (ref 3.9–10.3)

## 2017-08-29 LAB — COMPREHENSIVE METABOLIC PANEL
ALK PHOS: 95 U/L (ref 40–150)
ALT: 12 U/L (ref 0–55)
ANION GAP: 8 (ref 3–11)
AST: 16 U/L (ref 5–34)
Albumin: 3.8 g/dL (ref 3.5–5.0)
BUN: 11 mg/dL (ref 7–26)
CALCIUM: 9.5 mg/dL (ref 8.4–10.4)
CO2: 29 mmol/L (ref 22–29)
Chloride: 105 mmol/L (ref 98–109)
Creatinine, Ser: 1.05 mg/dL (ref 0.60–1.10)
GFR calc non Af Amer: 54 mL/min — ABNORMAL LOW (ref >60)
Glucose, Bld: 106 mg/dL (ref 70–140)
POTASSIUM: 3.6 mmol/L (ref 3.5–5.1)
SODIUM: 142 mmol/L (ref 136–145)
Total Bilirubin: 0.3 mg/dL (ref 0.2–1.2)
Total Protein: 7 g/dL (ref 6.4–8.3)

## 2017-08-29 LAB — TSH: TSH: 2.079 u[IU]/mL (ref 0.308–3.960)

## 2017-08-29 MED ORDER — SODIUM CHLORIDE 0.9% FLUSH
10.0000 mL | INTRAVENOUS | Status: DC | PRN
  Administered 2017-08-29: 10 mL
  Filled 2017-08-29: qty 10

## 2017-08-29 MED ORDER — SODIUM CHLORIDE 0.9 % IV SOLN
10.1000 mg/kg | Freq: Once | INTRAVENOUS | Status: AC
  Administered 2017-08-29: 1120 mg via INTRAVENOUS
  Filled 2017-08-29: qty 20

## 2017-08-29 MED ORDER — SODIUM CHLORIDE 0.9 % IV SOLN
Freq: Once | INTRAVENOUS | Status: AC
  Administered 2017-08-29: 14:00:00 via INTRAVENOUS

## 2017-08-29 MED ORDER — HEPARIN SOD (PORK) LOCK FLUSH 100 UNIT/ML IV SOLN
500.0000 [IU] | Freq: Once | INTRAVENOUS | Status: AC | PRN
  Administered 2017-08-29: 500 [IU]
  Filled 2017-08-29: qty 5

## 2017-08-29 MED ORDER — SODIUM CHLORIDE 0.9% FLUSH
10.0000 mL | INTRAVENOUS | Status: DC | PRN
  Administered 2017-08-29: 10 mL via INTRAVENOUS
  Filled 2017-08-29: qty 10

## 2017-08-29 NOTE — Patient Instructions (Signed)
[facility] Discharge Instructions for Patients Receiving Chemotherapy  Today you received the following chemotherapy agents: Imfinzi.  To help prevent nausea and vomiting after your treatment, we encourage you to take your nausea medication as directed.   If you develop nausea and vomiting that is not controlled by your nausea medication, call the clinic.   BELOW ARE SYMPTOMS THAT SHOULD BE REPORTED IMMEDIATELY:  *FEVER GREATER THAN 100.5 F  *CHILLS WITH OR WITHOUT FEVER  NAUSEA AND VOMITING THAT IS NOT CONTROLLED WITH YOUR NAUSEA MEDICATION  *UNUSUAL SHORTNESS OF BREATH  *UNUSUAL BRUISING OR BLEEDING  TENDERNESS IN MOUTH AND THROAT WITH OR WITHOUT PRESENCE OF ULCERS  *URINARY PROBLEMS  *BOWEL PROBLEMS  UNUSUAL RASH Items with * indicate a potential emergency and should be followed up as soon as possible.  Feel free to call the clinic should you have any questions or concerns. The clinic phone number is [phone].  Please show the CHEMO ALERT CARD at check-in to the Emergency Department and triage nurse.   

## 2017-08-29 NOTE — Assessment & Plan Note (Signed)
This is a very pleasant 67 year old with unresectable a stage IIIA non-small cell lung cancer. The patient underwent a course of concurrent chemoradiation with weekly carboplatin and paclitaxel status post 8 cycles and has been tolerating this treatment fairly well except for mild fatigue and odynophagia. The patient is currently undergoing consolidation immunotherapy with Imfinzi (Durvalumab) status post 13 cycles. She is tolerating her treatment with Imfinzi (Durvalumab) fairly well. Recommend for her to proceed with cycle 14 of Imfinzi today as scheduled. I will see her back for follow-up visit in 2 weeks for evaluation before the next dose of her treatment.  The patient was advised to call immediately if she has any concerning symptoms in the interval. The patient voices understanding of current disease status and treatment options and is in agreement with the current care plan. All questions were answered. The patient knows to call the clinic with any problems, questions or concerns. We can certainly see the patient much sooner if necessary.

## 2017-08-29 NOTE — Progress Notes (Signed)
[facility] OFFICE PROGRESS NOTE  [name], MD [place] [address]  DIAGNOSIS: Unresectable stage IIIA(T4, N0, M0) non-small cell lung cancer, squamous cell carcinoma diagnosed in May 2018 and presented with large obstructing left lower lobe lung mass suspicious small mediastinal lymphadenopathy.  PRIOR THERAPY: A course of concurrent chemoradiation with weekly carboplatin for AUC of 2 and paclitaxel 45 MG/M2. Status post 5 cycles. She received her radiation in [facility]. Last dose of chemotherapy was given 11/27/2016.  CURRENT THERAPY: consolidation immunotherapy with Imfinzi (Durvalumab) 10 MG/KG every 2 weeks, first dose 02/21/2017.  Status post 13 cycles.  INTERVAL HISTORY: Sarah [name] 67 y.o. female returns for routine follow-up visit accompanied by her husband.  Patient is feeling fine they have no specific complaints.  Patient denies fevers and chills.  Denies chest pain, cough, hemoptysis.  She has her baseline shortness of breath and wears home oxygen.  Denies nausea, vomiting, constipation, diarrhea.  Has recent weight loss or night sweats.  The patient is here for evaluation prior to cycle #14 of her treatment.  MEDICAL HISTORY: Past Medical History:  Diagnosis Date  . Arthritis   . Cancer ([place])    lung cancer  . Chronic anxiety    Patient describes as stress  . Chronic back pain   . DVT (deep venous thrombosis) ([place]) age 41  . GERD (gastroesophageal reflux disease)   . Goals of care, counseling/discussion 09/21/2016  . Headache(784.0)   . Hypoestrogenism   . Lower extremity venous stasis    LLE, chronic  . Obesity   . Odynophagia 11/13/2016  . Paroxysmal atrial fibrillation (HCC)   . PONV (postoperative nausea and vomiting)   . Pulmonary embolism ([place])   . Reflux   . Sleep apnea    wear CPAP  . Spinal stenosis   . Tobacco abuse   . Venous stasis   . Warfarin anticoagulation     ALLERGIES:  is  allergic to aspirin.  MEDICATIONS:  Current Outpatient Medications  Medication Sig Dispense Refill  . albuterol (PROVENTIL HFA;VENTOLIN HFA) 108 (90 Base) MCG/ACT inhaler Inhale 2 puffs into the lungs every 6 (six) hours as needed for wheezing or shortness of breath. 1 Inhaler 5  . budesonide-formoterol (SYMBICORT) 160-4.5 MCG/ACT inhaler Inhale 2 puffs into the lungs 2 (two) times daily. 1 Inhaler 12  . buPROPion (WELLBUTRIN SR) 150 MG 12 hr tablet TAKE 1 TABLET BY MOUTH TWICE DAILY **KEEP APPT ON 06/09/16** (Patient taking differently: Take 150 mg by mouth daily. TAKE 1 TABLET BY MOUTH TWICE DAILY **KEEP APPT ON 06/09/16**) 180 tablet 0  . calcium carbonate (TUMS - DOSED IN MG ELEMENTAL CALCIUM) 500 MG chewable tablet Chew 1 tablet by mouth 2 (two) times a week.     Marland Kitchen dexamethasone 0.5 MG/5ML elixir     . diltiazem (CARDIZEM CD) 180 MG 24 hr capsule TAKE ONE CAPSULE BY MOUTH EVERY DAY 90 capsule 1  . Diphenhyd-Hydrocort-Nystatin (FIRST-DUKES MOUTHWASH) SUSP One tablespoon swish and spit qid 1 Bottle 0  . flecainide (TAMBOCOR) 50 MG tablet TAKE 1 TABLET (50 MG TOTAL) BY MOUTH 2 (TWO) TIMES DAILY. 60 tablet 0  . HYDROcodone-acetaminophen (NORCO) 7.5-325 MG tablet One tablet up to three times a day as needed 90 tablet 0  . ketoconazole (NIZORAL) 2 % cream Apply BID for rash 30 g 2  . lidocaine-prilocaine (EMLA) cream Apply 1 application as needed topically. 30 g 2  . metoprolol tartrate (LOPRESSOR) 25 MG  tablet TAKE 1 TABLET AS NEEDED FOR HEART RACING 10 tablet 5  . pantoprazole (PROTONIX) 40 MG tablet Take 1 tablet (40 mg total) by mouth every morning. 90 tablet 1  . prochlorperazine (COMPAZINE) 10 MG tablet Take 1 tablet (10 mg total) by mouth every 6 (six) hours as needed for nausea or vomiting. 30 tablet 0  . promethazine (PHENERGAN) 25 MG tablet TAKE 1 TABLET BY MOUTH EVERY 6 HOURS AS NEEDED FOR NAUSEA/VOMITING 30 tablet 0  . sucralfate (CARAFATE) 1 g tablet Take 1 tablet (1 g total) by mouth  2 (two) times daily. 60 tablet 0  . warfarin (COUMADIN) 5 MG tablet Take 0-2.5 mg daily by mouth. Take 2.5 mg Daily except none on Monday and Thursday.     No current facility-administered medications for this visit.    Facility-Administered Medications Ordered in Other Visits  Medication Dose Route Frequency Provider Last Rate Last Dose  . sodium chloride flush (NS) 0.9 % injection 10 mL  10 mL Intracatheter PRN Curt Bears, MD   10 mL at 08/29/17 1615    SURGICAL HISTORY:  Past Surgical History:  Procedure Laterality Date  . ANKLE SURGERY     x2  . APPENDECTOMY    . CARPAL TUNNEL RELEASE    . CHOLECYSTECTOMY    . IR FLUORO GUIDE PORT INSERTION RIGHT  03/19/2017  . IR US GUIDE VASC ACCESS RIGHT  03/19/2017  . OVARIAN CYST REMOVAL    . ROTATOR CUFF REPAIR    . TUBAL LIGATION    . VIDEO BRONCHOSCOPY WITH ENDOBRONCHIAL ULTRASOUND N/A 09/14/2016   Procedure: VIDEO BRONCHOSCOPY WITH ENDOBRONCHIAL ULTRASOUND;  Surgeon: [place] Nakayama, MD;  Location: [facility] OR;  Service: Thoracic;  Laterality: N/A;    REVIEW OF SYSTEMS:   Review of Systems  Constitutional: Negative for appetite change, chills, fatigue, fever and unexpected weight change.  HENT:   Negative for mouth sores, nosebleeds, sore throat and trouble swallowing.   Eyes: Negative for eye problems and icterus.  Respiratory: Negative for cough, hemoptysis, and wheezing.  Patient has her baseline shortness of breath and wears home oxygen.  Cardiovascular: Negative for chest pain and leg swelling.  Gastrointestinal: Negative for abdominal pain, constipation, diarrhea, nausea and vomiting.  Genitourinary: Negative for bladder incontinence, difficulty urinating, dysuria, frequency and hematuria.   Musculoskeletal: Negative for back pain, neck pain and neck stiffness.  Skin: Negative for itching and rash.  Neurological: Negative for dizziness, extremity weakness, headaches, light-headedness and seizures.  Hematological:  Negative for adenopathy. Does not bruise/bleed easily.  Psychiatric/Behavioral: Negative for confusion, depression and sleep disturbance. The patient is not nervous/anxious.     PHYSICAL EXAMINATION:  Blood pressure 115/66, pulse 71, temperature 98.3 F (36.8 C), temperature source Oral, resp. rate 18, height 5\' 7"  (1.702 m), weight 236 lb 3.2 oz (107.1 kg), SpO2 98 %.  ECOG PERFORMANCE STATUS: 1 - Symptomatic but completely ambulatory  Physical Exam  Constitutional: Oriented to person, place, and time and well-developed, well-nourished, and in no distress. No distress.  HENT:  Head: Normocephalic and atraumatic.  Mouth/Throat: Oropharynx is clear and moist. No oropharyngeal exudate.  Eyes: Conjunctivae are normal. Right eye exhibits no discharge. Left eye exhibits no discharge. No scleral icterus.  Neck: Normal range of motion. Neck supple.  Cardiovascular: Normal rate, regular rhythm, normal heart sounds and intact distal pulses.   Pulmonary/Chest: Effort normal and breath sounds normal. No respiratory distress. No wheezes. No rales.  Abdominal: Soft. Bowel sounds are normal. Exhibits no distension and  no mass. There is no tenderness.  Musculoskeletal: Normal range of motion. Exhibits no edema.  Lymphadenopathy:    No cervical adenopathy.  Neurological: Alert and oriented to person, place, and time. Exhibits normal muscle tone. Coordination normal.  Skin: Skin is warm and dry. No rash noted. Not diaphoretic. No erythema. No pallor.  Psychiatric: Mood, memory and judgment normal.  Vitals reviewed.  LABORATORY DATA: Lab Results  Component Value Date   WBC 4.9 08/29/2017   HGB 13.3 08/29/2017   HCT 40.7 08/29/2017   MCV 95.1 08/29/2017   PLT 177 08/29/2017      Chemistry      Component Value Date/Time   NA 142 08/29/2017 1224   NA 142 04/26/2017 1106   K 3.6 08/29/2017 1224   K 3.4 (L) 04/26/2017 1106   CL 105 08/29/2017 1224   CO2 29 08/29/2017 1224   CO2 29 04/26/2017  1106   BUN 11 08/29/2017 1224   BUN 7.5 04/26/2017 1106   CREATININE 1.05 08/29/2017 1224   CREATININE 0.9 04/26/2017 1106      Component Value Date/Time   CALCIUM 9.5 08/29/2017 1224   CALCIUM 9.1 04/26/2017 1106   ALKPHOS 95 08/29/2017 1224   ALKPHOS 99 04/26/2017 1106   AST 16 08/29/2017 1224   AST 14 04/26/2017 1106   ALT 12 08/29/2017 1224   ALT 12 04/26/2017 1106   BILITOT 0.3 08/29/2017 1224   BILITOT 0.28 04/26/2017 1106       RADIOGRAPHIC STUDIES:  Ct Chest W Contrast  Result Date: 08/13/2017 CLINICAL DATA:  Follow-up lung cancer. EXAM: CT CHEST WITH CONTRAST TECHNIQUE: Multidetector CT imaging of the chest was performed during intravenous contrast administration. CONTRAST:  48mL OMNIPAQUE IOHEXOL 300 MG/ML  SOLN COMPARISON:  05/21/2017 FINDINGS: Cardiovascular: The heart size appears normal. No pericardial effusion. Aortic atherosclerosis. Calcification within the RCA coronary artery noted. The main pulmonary artery measures 3.2 cm. Mediastinum/Nodes: The trachea appears patent and is midline. Normal appearance of the thyroid gland. Normal appearance of the esophagus. No enlarged mediastinal or hilar lymph nodes. Lungs/Pleura: Mild to moderate changes of emphysema noted. Again noted is significant volume loss from the left lung. Partially loculated left pleural effusion is similar in volume to the previous exam. The central left lower lobe lung mass is again noted. This measures 6.4 by 3.8 by 5.7 cm (volume = 73 cm^3). Previously 6.5 by 3.7 by 6.2 cm (volume = 78 cm^3). Mass-like architectural distortion and fibrosis within the perihilar left upper lobe is similar to previous exam. There has been mild improved aeration to the left lower lobe compared with prior study. Previously referenced 3 mm basilar right lower lobe pulmonary nodule is no longer visualized compatible with an inflammatory or infectious process. Upper Abdomen: Previously characterized adrenal gland adenoma is  stable measuring 2.5 cm, image 142/2. Simple appearing cyst noted within the upper pole of right kidney. No acute findings noted within the upper abdomen. Musculoskeletal: Spondylosis identified within the thoracic spine. No suspicious bone lesions. IMPRESSION: 1. The central left lower lobe lung mass is stable to minimally decreased in size in the interval. 2. Similar appearance of moderate loculated left pleural effusion. 3. Previously referenced 3 mm right lower lobe pulmonary nodule has resolved in the interval. 4. Increase caliber of the main pulmonary artery suggestive of PA hypertension. 5. Stable appearance of right adrenal gland adenoma 6. Aortic Atherosclerosis (ICD10-I70.0) and Emphysema (ICD10-J43.9). Electronically Signed   By: Kerby Moors M.D.   On: 08/13/2017 16:17  ASSESSMENT/PLAN:  Squamous cell carcinoma of lung, left (HCC) This is a very pleasant 68 year old with unresectable a stage IIIA non-small cell lung cancer. The patient underwent a course of concurrent chemoradiation with weekly carboplatin and paclitaxel status post 8 cycles and has been tolerating this treatment fairly well except for mild fatigue and odynophagia. The patient is currently undergoing consolidation immunotherapy with Imfinzi (Durvalumab) status post 13 cycles. She is tolerating her treatment with Imfinzi (Durvalumab) fairly well. Recommend for her to proceed with cycle 14 of Imfinzi today as scheduled. I will see her back for follow-up visit in 2 weeks for evaluation before the next dose of her treatment.  The patient was advised to call immediately if she has any concerning symptoms in the interval. The patient voices understanding of current disease status and treatment options and is in agreement with the current care plan. All questions were answered. The patient knows to call the clinic with any problems, questions or concerns. We can certainly see the patient much sooner if necessary.   No  orders of the defined types were placed in this encounter.  Mikey Bussing, DNP, AGPCNP-BC, AOCNP 08/29/17

## 2017-08-29 NOTE — Telephone Encounter (Signed)
Scheduled appt per 5/1 los - patient to get an updated schedule in the treatment area.

## 2017-08-30 ENCOUNTER — Other Ambulatory Visit: Payer: Self-pay | Admitting: Family Medicine

## 2017-08-31 ENCOUNTER — Ambulatory Visit (INDEPENDENT_AMBULATORY_CARE_PROVIDER_SITE_OTHER): Payer: Medicare Other | Admitting: *Deleted

## 2017-08-31 DIAGNOSIS — Z7901 Long term (current) use of anticoagulants: Secondary | ICD-10-CM

## 2017-08-31 LAB — POCT INR: INR: 3.1

## 2017-09-05 ENCOUNTER — Other Ambulatory Visit: Payer: Self-pay | Admitting: Cardiology

## 2017-09-12 ENCOUNTER — Encounter: Payer: Self-pay | Admitting: Oncology

## 2017-09-12 ENCOUNTER — Inpatient Hospital Stay: Payer: Medicare Other

## 2017-09-12 ENCOUNTER — Inpatient Hospital Stay (HOSPITAL_BASED_OUTPATIENT_CLINIC_OR_DEPARTMENT_OTHER): Payer: Medicare Other | Admitting: Oncology

## 2017-09-12 ENCOUNTER — Telehealth: Payer: Self-pay | Admitting: Oncology

## 2017-09-12 VITALS — BP 100/44 | HR 77 | Temp 98.6°F | Resp 17 | Ht 67.0 in | Wt 236.4 lb

## 2017-09-12 DIAGNOSIS — Z86711 Personal history of pulmonary embolism: Secondary | ICD-10-CM

## 2017-09-12 DIAGNOSIS — C3432 Malignant neoplasm of lower lobe, left bronchus or lung: Secondary | ICD-10-CM | POA: Diagnosis not present

## 2017-09-12 DIAGNOSIS — Z5112 Encounter for antineoplastic immunotherapy: Secondary | ICD-10-CM

## 2017-09-12 DIAGNOSIS — C3492 Malignant neoplasm of unspecified part of left bronchus or lung: Secondary | ICD-10-CM

## 2017-09-12 DIAGNOSIS — G4730 Sleep apnea, unspecified: Secondary | ICD-10-CM | POA: Diagnosis not present

## 2017-09-12 DIAGNOSIS — Z923 Personal history of irradiation: Secondary | ICD-10-CM

## 2017-09-12 DIAGNOSIS — Z95828 Presence of other vascular implants and grafts: Secondary | ICD-10-CM

## 2017-09-12 DIAGNOSIS — I700 Atherosclerosis of aorta: Secondary | ICD-10-CM

## 2017-09-12 DIAGNOSIS — K219 Gastro-esophageal reflux disease without esophagitis: Secondary | ICD-10-CM

## 2017-09-12 DIAGNOSIS — M129 Arthropathy, unspecified: Secondary | ICD-10-CM

## 2017-09-12 DIAGNOSIS — E669 Obesity, unspecified: Secondary | ICD-10-CM | POA: Diagnosis not present

## 2017-09-12 DIAGNOSIS — G8929 Other chronic pain: Secondary | ICD-10-CM

## 2017-09-12 DIAGNOSIS — M549 Dorsalgia, unspecified: Secondary | ICD-10-CM

## 2017-09-12 DIAGNOSIS — F419 Anxiety disorder, unspecified: Secondary | ICD-10-CM | POA: Diagnosis not present

## 2017-09-12 DIAGNOSIS — Z79899 Other long term (current) drug therapy: Secondary | ICD-10-CM

## 2017-09-12 DIAGNOSIS — I4891 Unspecified atrial fibrillation: Secondary | ICD-10-CM

## 2017-09-12 DIAGNOSIS — Z9221 Personal history of antineoplastic chemotherapy: Secondary | ICD-10-CM

## 2017-09-12 DIAGNOSIS — Z7901 Long term (current) use of anticoagulants: Secondary | ICD-10-CM

## 2017-09-12 LAB — CBC WITH DIFFERENTIAL/PLATELET
BASOS ABS: 0 10*3/uL (ref 0.0–0.1)
Basophils Relative: 0 %
Eosinophils Absolute: 0.1 10*3/uL (ref 0.0–0.5)
Eosinophils Relative: 3 %
HEMATOCRIT: 41.8 % (ref 34.8–46.6)
HEMOGLOBIN: 13.6 g/dL (ref 11.6–15.9)
LYMPHS ABS: 0.5 10*3/uL — AB (ref 0.9–3.3)
LYMPHS PCT: 9 %
MCH: 31 pg (ref 25.1–34.0)
MCHC: 32.5 g/dL (ref 31.5–36.0)
MCV: 95.2 fL (ref 79.5–101.0)
Monocytes Absolute: 0.6 10*3/uL (ref 0.1–0.9)
Monocytes Relative: 11 %
NEUTROS PCT: 77 %
Neutro Abs: 4.3 10*3/uL (ref 1.5–6.5)
PLATELETS: 183 10*3/uL (ref 145–400)
RBC: 4.39 MIL/uL (ref 3.70–5.45)
RDW: 14.2 % (ref 11.2–14.5)
WBC: 5.6 10*3/uL (ref 3.9–10.3)

## 2017-09-12 LAB — COMPREHENSIVE METABOLIC PANEL
ALBUMIN: 3.9 g/dL (ref 3.5–5.0)
ALT: 18 U/L (ref 0–55)
ANION GAP: 9 (ref 3–11)
AST: 18 U/L (ref 5–34)
Alkaline Phosphatase: 99 U/L (ref 40–150)
BILIRUBIN TOTAL: 0.3 mg/dL (ref 0.2–1.2)
BUN: 8 mg/dL (ref 7–26)
CO2: 29 mmol/L (ref 22–29)
Calcium: 9.4 mg/dL (ref 8.4–10.4)
Chloride: 104 mmol/L (ref 98–109)
Creatinine, Ser: 0.95 mg/dL (ref 0.60–1.10)
GFR calc non Af Amer: >60 mL/min (ref >60)
GLUCOSE: 89 mg/dL (ref 70–140)
POTASSIUM: 4 mmol/L (ref 3.5–5.1)
Sodium: 142 mmol/L (ref 136–145)
TOTAL PROTEIN: 7.1 g/dL (ref 6.4–8.3)

## 2017-09-12 MED ORDER — HEPARIN SOD (PORK) LOCK FLUSH 100 UNIT/ML IV SOLN
500.0000 [IU] | Freq: Once | INTRAVENOUS | Status: AC | PRN
Start: 2017-09-12 — End: 2017-09-12
  Administered 2017-09-12: 500 [IU]
  Filled 2017-09-12: qty 5

## 2017-09-12 MED ORDER — SODIUM CHLORIDE 0.9% FLUSH
10.0000 mL | INTRAVENOUS | Status: DC | PRN
  Administered 2017-09-12: 10 mL via INTRAVENOUS
  Filled 2017-09-12: qty 10

## 2017-09-12 MED ORDER — SODIUM CHLORIDE 0.9% FLUSH
10.0000 mL | INTRAVENOUS | Status: DC | PRN
  Administered 2017-09-12: 10 mL
  Filled 2017-09-12: qty 10

## 2017-09-12 MED ORDER — SODIUM CHLORIDE 0.9 % IV SOLN
Freq: Once | INTRAVENOUS | Status: AC
  Administered 2017-09-12: 14:00:00 via INTRAVENOUS

## 2017-09-12 MED ORDER — SODIUM CHLORIDE 0.9 % IV SOLN
10.1000 mg/kg | Freq: Once | INTRAVENOUS | Status: AC
  Administered 2017-09-12: 1120 mg via INTRAVENOUS
  Filled 2017-09-12: qty 20

## 2017-09-12 NOTE — Progress Notes (Signed)
[name] OFFICE PROGRESS NOTE  Sarah Kirschner, MD [place] [address]  DIAGNOSIS: Unresectable stage IIIA(T4, N0, M0) non-small cell lung cancer, squamous cell carcinoma diagnosed in May 2018 and presented with large obstructing left lower lobe lung mass suspicious small mediastinal lymphadenopathy.  PRIOR THERAPY: A course of concurrent chemoradiation with weekly carboplatin for AUC of 2 and paclitaxel 45 MG/M2. Status post 5 cycles. She received her radiation in [facility]. Last dose of chemotherapy was given 11/27/2016.  CURRENT THERAPY: consolidation immunotherapy with Imfinzi (Durvalumab) 10 MG/KG every 2 weeks, first dose 02/21/2017. Status post 14cycles.  INTERVAL HISTORY: Sarah [name] 67 y.o. female returns for routine follow-up visit accompanied by her husband.  The patient is feeling fine today and has no specific complaints today for her slight shortness of breath.  She wears home oxygen.  The patient denies fevers and chills.  Denies chest pain, cough, hemoptysis.  Denies nausea, vomiting, constipation, diarrhea.  Denies recent weight loss or night sweats.  The patient is here for evaluation prior to cycle #15 of her treatment.  MEDICAL HISTORY: Past Medical History:  Diagnosis Date  . Arthritis   . Cancer ([place])    lung cancer  . Chronic anxiety    Patient describes as stress  . Chronic back pain   . DVT (deep venous thrombosis) ([place]) age 50  . GERD (gastroesophageal reflux disease)   . Goals of care, counseling/discussion 09/21/2016  . Headache(784.0)   . Hypoestrogenism   . Lower extremity venous stasis    LLE, chronic  . Obesity   . Odynophagia 11/13/2016  . Paroxysmal atrial fibrillation (HCC)   . PONV (postoperative nausea and vomiting)   . Pulmonary embolism ([place])   . Reflux   . Sleep apnea    wear CPAP  . Spinal stenosis   . Tobacco abuse   . Venous stasis   . Warfarin anticoagulation      ALLERGIES:  is allergic to aspirin.  MEDICATIONS:  Current Outpatient Medications  Medication Sig Dispense Refill  . albuterol (PROVENTIL HFA;VENTOLIN HFA) 108 (90 Base) MCG/ACT inhaler Inhale 2 puffs into the lungs every 6 (six) hours as needed for wheezing or shortness of breath. 1 Inhaler 5  . budesonide-formoterol (SYMBICORT) 160-4.5 MCG/ACT inhaler Inhale 2 puffs into the lungs 2 (two) times daily. 1 Inhaler 12  . buPROPion (WELLBUTRIN SR) 150 MG 12 hr tablet TAKE 1 TABLET BY MOUTH TWICE DAILY **KEEP APPT ON 06/09/16** (Patient taking differently: Take 150 mg by mouth daily. TAKE 1 TABLET BY MOUTH TWICE DAILY **KEEP APPT ON 06/09/16**) 180 tablet 0  . calcium carbonate (TUMS - DOSED IN MG ELEMENTAL CALCIUM) 500 MG chewable tablet Chew 1 tablet by mouth 2 (two) times a week.     . diltiazem (CARDIZEM CD) 180 MG 24 hr capsule TAKE ONE CAPSULE BY MOUTH EVERY DAY 90 capsule 1  . Diphenhyd-Hydrocort-Nystatin (FIRST-DUKES MOUTHWASH) SUSP One tablespoon swish and spit qid 1 Bottle 0  . flecainide (TAMBOCOR) 50 MG tablet TAKE 1 TABLET BY MOUTH TWICE A DAY 60 tablet 0  . HYDROcodone-acetaminophen (NORCO) 7.5-325 MG tablet One tablet up to three times a day as needed 90 tablet 0  . ketoconazole (NIZORAL) 2 % cream Apply BID for rash 30 g 2  . lidocaine-prilocaine (EMLA) cream Apply 1 application as needed topically. 30 g 2  . metoprolol tartrate (LOPRESSOR) 25 MG tablet TAKE 1 TABLET AS NEEDED FOR HEART RACING 10 tablet 5  .  pantoprazole (PROTONIX) 40 MG tablet Take 1 tablet (40 mg total) by mouth every morning. 90 tablet 1  . prochlorperazine (COMPAZINE) 10 MG tablet Take 1 tablet (10 mg total) by mouth every 6 (six) hours as needed for nausea or vomiting. 30 tablet 0  . promethazine (PHENERGAN) 25 MG tablet TAKE 1 TABLET BY MOUTH EVERY 6 HOURS AS NEEDED FOR NAUSEA/VOMITING 30 tablet 0  . sucralfate (CARAFATE) 1 g tablet Take 1 tablet (1 g total) by mouth 2 (two) times daily. 60 tablet 0  .  warfarin (COUMADIN) 5 MG tablet Take 0-2.5 mg daily by mouth. Take 2.5 mg Daily except none on Monday and Thursday.     No current facility-administered medications for this visit.    Facility-Administered Medications Ordered in Other Visits  Medication Dose Route Frequency Provider Last Rate Last Dose  . sodium chloride flush (NS) 0.9 % injection 10 mL  10 mL Intracatheter PRN Curt Bears, MD   10 mL at 09/12/17 1538    SURGICAL HISTORY:  Past Surgical History:  Procedure Laterality Date  . ANKLE SURGERY     x2  . APPENDECTOMY    . CARPAL TUNNEL RELEASE    . CHOLECYSTECTOMY    . IR FLUORO GUIDE PORT INSERTION RIGHT  03/19/2017  . IR US GUIDE VASC ACCESS RIGHT  03/19/2017  . OVARIAN CYST REMOVAL    . ROTATOR CUFF REPAIR    . TUBAL LIGATION    . VIDEO BRONCHOSCOPY WITH ENDOBRONCHIAL ULTRASOUND N/A 09/14/2016   Procedure: VIDEO BRONCHOSCOPY WITH ENDOBRONCHIAL ULTRASOUND;  Surgeon: [place] Nakayama, MD;  Location: [facility] OR;  Service: Thoracic;  Laterality: N/A;    REVIEW OF SYSTEMS:   Review of Systems  Constitutional: Negative for appetite change, chills, fatigue, fever and unexpected weight change.  HENT:   Negative for mouth sores, nosebleeds, sore throat and trouble swallowing.   Eyes: Negative for eye problems and icterus.  Respiratory: Negative for cough, hemoptysis, and wheezing.  The patient has her baseline shortness of breath and wears home oxygen. Cardiovascular: Negative for chest pain and leg swelling.  Gastrointestinal: Negative for abdominal pain, constipation, diarrhea, nausea and vomiting.  Genitourinary: Negative for bladder incontinence, difficulty urinating, dysuria, frequency and hematuria.   Musculoskeletal: Negative for back pain, neck pain and neck stiffness.  Skin: Negative for itching and rash.  Neurological: Negative for dizziness, extremity weakness, headaches, light-headedness and seizures.  Hematological: Negative for adenopathy. Does not  bruise/bleed easily.  Psychiatric/Behavioral: Negative for confusion, depression and sleep disturbance. The patient is not nervous/anxious.     PHYSICAL EXAMINATION:  Blood pressure (!) 100/44, pulse 77, temperature 98.6 F (37 C), temperature source Oral, resp. rate 17, height 5\' 7"  (1.702 m), weight 236 lb 6.4 oz (107.2 kg), SpO2 99 %.  ECOG PERFORMANCE STATUS: 1 - Symptomatic but completely ambulatory  Physical Exam  Constitutional: Oriented to person, place, and time and well-developed, well-nourished, and in no distress. No distress.  HENT:  Head: Normocephalic and atraumatic.  Mouth/Throat: Oropharynx is clear and moist. No oropharyngeal exudate.  Eyes: Conjunctivae are normal. Right eye exhibits no discharge. Left eye exhibits no discharge. No scleral icterus.  Neck: Normal range of motion. Neck supple.  Cardiovascular: Normal rate, regular rhythm, normal heart sounds and intact distal pulses.   Pulmonary/Chest: Effort normal and breath sounds normal. No respiratory distress. No wheezes. No rales.  Abdominal: Soft. Bowel sounds are normal. Exhibits no distension and no mass. There is no tenderness.  Musculoskeletal: Normal range of motion. Exhibits  no edema.  Lymphadenopathy:    No cervical adenopathy.  Neurological: Alert and oriented to person, place, and time. Exhibits normal muscle tone. Coordination normal.  Skin: Skin is warm and dry. No rash noted. Not diaphoretic. No erythema. No pallor.  Psychiatric: Mood, memory and judgment normal.  Vitals reviewed.  LABORATORY DATA: Lab Results  Component Value Date   WBC 5.6 09/12/2017   HGB 13.6 09/12/2017   HCT 41.8 09/12/2017   MCV 95.2 09/12/2017   PLT 183 09/12/2017      Chemistry      Component Value Date/Time   NA 142 09/12/2017 1035   NA 142 04/26/2017 1106   K 4.0 09/12/2017 1035   K 3.4 (L) 04/26/2017 1106   CL 104 09/12/2017 1035   CO2 29 09/12/2017 1035   CO2 29 04/26/2017 1106   BUN 8 09/12/2017 1035    BUN 7.5 04/26/2017 1106   CREATININE 0.95 09/12/2017 1035   CREATININE 0.9 04/26/2017 1106      Component Value Date/Time   CALCIUM 9.4 09/12/2017 1035   CALCIUM 9.1 04/26/2017 1106   ALKPHOS 99 09/12/2017 1035   ALKPHOS 99 04/26/2017 1106   AST 18 09/12/2017 1035   AST 14 04/26/2017 1106   ALT 18 09/12/2017 1035   ALT 12 04/26/2017 1106   BILITOT 0.3 09/12/2017 1035   BILITOT 0.28 04/26/2017 1106       RADIOGRAPHIC STUDIES:  No results found.   ASSESSMENT/PLAN:  Squamous cell carcinoma of lung, left (HCC) This is a very pleasant 67 year old with unresectable a stage IIIA non-small cell lung cancer. The patient underwent a course of concurrent chemoradiation with weekly carboplatin and paclitaxel status post 8 cycles and has been tolerating this treatment fairly well except for mild fatigue and odynophagia. The patient is currently undergoing consolidation immunotherapy with Imfinzi (Durvalumab) status post14cycles. She is tolerating her treatment with Imfinzi (Durvalumab) fairly well. Recommend for her to proceed with cycle 15 of Imfinzi today as scheduled. I will see her back for follow-up visit in 2 weeks for evaluation before the next dose of her treatment.  The patient was advised to call immediately if she has any concerning symptoms in the interval. The patient voices understanding of current disease status and treatment options and is in agreement with the current care plan. All questions were answered. The patient knows to call the clinic with any problems, questions or concerns. We can certainly see the patient much sooner if necessary.  No orders of the defined types were placed in this encounter.  Sarah Bussing, DNP, AGPCNP-BC, AOCNP 09/12/17

## 2017-09-12 NOTE — Assessment & Plan Note (Signed)
This is a very pleasant 67 year old with unresectable a stage IIIA non-small cell lung cancer. The patient underwent a course of concurrent chemoradiation with weekly carboplatin and paclitaxel status post 8 cycles and has been tolerating this treatment fairly well except for mild fatigue and odynophagia. The patient is currently undergoing consolidation immunotherapy with Imfinzi (Durvalumab) status post14cycles. She is tolerating her treatment with Imfinzi (Durvalumab) fairly well. Recommend for her to proceed with cycle 15 of Imfinzi today as scheduled. I will see her back for follow-up visit in 2 weeks for evaluation before the next dose of her treatment.  The patient was advised to call immediately if she has any concerning symptoms in the interval. The patient voices understanding of current disease status and treatment options and is in agreement with the current care plan. All questions were answered. The patient knows to call the clinic with any problems, questions or concerns. We can certainly see the patient much sooner if necessary.

## 2017-09-12 NOTE — Telephone Encounter (Signed)
3 cycles already scheduled per 5/15 los.

## 2017-09-12 NOTE — Patient Instructions (Signed)
[facility] Discharge Instructions for Patients Receiving Chemotherapy  Today you received the following chemotherapy agents: Imfinzi.  To help prevent nausea and vomiting after your treatment, we encourage you to take your nausea medication as directed.   If you develop nausea and vomiting that is not controlled by your nausea medication, call the clinic.   BELOW ARE SYMPTOMS THAT SHOULD BE REPORTED IMMEDIATELY:  *FEVER GREATER THAN 100.5 F  *CHILLS WITH OR WITHOUT FEVER  NAUSEA AND VOMITING THAT IS NOT CONTROLLED WITH YOUR NAUSEA MEDICATION  *UNUSUAL SHORTNESS OF BREATH  *UNUSUAL BRUISING OR BLEEDING  TENDERNESS IN MOUTH AND THROAT WITH OR WITHOUT PRESENCE OF ULCERS  *URINARY PROBLEMS  *BOWEL PROBLEMS  UNUSUAL RASH Items with * indicate a potential emergency and should be followed up as soon as possible.  Feel free to call the clinic should you have any questions or concerns. The clinic phone number is [phone].  Please show the CHEMO ALERT CARD at check-in to the Emergency Department and triage nurse.   

## 2017-09-26 ENCOUNTER — Inpatient Hospital Stay (HOSPITAL_BASED_OUTPATIENT_CLINIC_OR_DEPARTMENT_OTHER): Payer: Medicare Other | Admitting: Internal Medicine

## 2017-09-26 ENCOUNTER — Encounter: Payer: Self-pay | Admitting: Internal Medicine

## 2017-09-26 ENCOUNTER — Inpatient Hospital Stay: Payer: Medicare Other

## 2017-09-26 ENCOUNTER — Telehealth: Payer: Self-pay | Admitting: Internal Medicine

## 2017-09-26 DIAGNOSIS — Z86711 Personal history of pulmonary embolism: Secondary | ICD-10-CM

## 2017-09-26 DIAGNOSIS — Z79899 Other long term (current) drug therapy: Secondary | ICD-10-CM

## 2017-09-26 DIAGNOSIS — I4891 Unspecified atrial fibrillation: Secondary | ICD-10-CM | POA: Diagnosis not present

## 2017-09-26 DIAGNOSIS — M129 Arthropathy, unspecified: Secondary | ICD-10-CM

## 2017-09-26 DIAGNOSIS — E669 Obesity, unspecified: Secondary | ICD-10-CM | POA: Diagnosis not present

## 2017-09-26 DIAGNOSIS — G8929 Other chronic pain: Secondary | ICD-10-CM | POA: Diagnosis not present

## 2017-09-26 DIAGNOSIS — C3432 Malignant neoplasm of lower lobe, left bronchus or lung: Secondary | ICD-10-CM

## 2017-09-26 DIAGNOSIS — K219 Gastro-esophageal reflux disease without esophagitis: Secondary | ICD-10-CM

## 2017-09-26 DIAGNOSIS — G4730 Sleep apnea, unspecified: Secondary | ICD-10-CM | POA: Diagnosis not present

## 2017-09-26 DIAGNOSIS — Z923 Personal history of irradiation: Secondary | ICD-10-CM

## 2017-09-26 DIAGNOSIS — M549 Dorsalgia, unspecified: Secondary | ICD-10-CM | POA: Diagnosis not present

## 2017-09-26 DIAGNOSIS — C3492 Malignant neoplasm of unspecified part of left bronchus or lung: Secondary | ICD-10-CM

## 2017-09-26 DIAGNOSIS — I700 Atherosclerosis of aorta: Secondary | ICD-10-CM | POA: Diagnosis not present

## 2017-09-26 DIAGNOSIS — Z5112 Encounter for antineoplastic immunotherapy: Secondary | ICD-10-CM

## 2017-09-26 DIAGNOSIS — R5382 Chronic fatigue, unspecified: Secondary | ICD-10-CM

## 2017-09-26 DIAGNOSIS — F419 Anxiety disorder, unspecified: Secondary | ICD-10-CM | POA: Diagnosis not present

## 2017-09-26 DIAGNOSIS — Z9221 Personal history of antineoplastic chemotherapy: Secondary | ICD-10-CM

## 2017-09-26 DIAGNOSIS — Z95828 Presence of other vascular implants and grafts: Secondary | ICD-10-CM

## 2017-09-26 DIAGNOSIS — Z7901 Long term (current) use of anticoagulants: Secondary | ICD-10-CM

## 2017-09-26 DIAGNOSIS — C3490 Malignant neoplasm of unspecified part of unspecified bronchus or lung: Secondary | ICD-10-CM

## 2017-09-26 LAB — COMPREHENSIVE METABOLIC PANEL
ALBUMIN: 3.9 g/dL (ref 3.5–5.0)
ALK PHOS: 100 U/L (ref 40–150)
ALT: 23 U/L (ref 0–55)
AST: 21 U/L (ref 5–34)
Anion gap: 12 — ABNORMAL HIGH (ref 3–11)
BILIRUBIN TOTAL: 0.3 mg/dL (ref 0.2–1.2)
BUN: 8 mg/dL (ref 7–26)
CALCIUM: 9.7 mg/dL (ref 8.4–10.4)
CO2: 28 mmol/L (ref 22–29)
Chloride: 103 mmol/L (ref 98–109)
Creatinine, Ser: 1.06 mg/dL (ref 0.60–1.10)
GFR calc Af Amer: >60 mL/min (ref >60)
GFR calc non Af Amer: 53 mL/min — ABNORMAL LOW (ref >60)
GLUCOSE: 108 mg/dL (ref 70–140)
Potassium: 3.9 mmol/L (ref 3.5–5.1)
Sodium: 143 mmol/L (ref 136–145)
TOTAL PROTEIN: 7.4 g/dL (ref 6.4–8.3)

## 2017-09-26 LAB — CBC WITH DIFFERENTIAL/PLATELET
Basophils Absolute: 0 10*3/uL (ref 0.0–0.1)
Basophils Relative: 0 %
Eosinophils Absolute: 0.1 10*3/uL (ref 0.0–0.5)
Eosinophils Relative: 1 %
HEMATOCRIT: 43.9 % (ref 34.8–46.6)
Hemoglobin: 14 g/dL (ref 11.6–15.9)
Lymphocytes Relative: 11 %
Lymphs Abs: 0.7 10*3/uL — ABNORMAL LOW (ref 0.9–3.3)
MCH: 30.6 pg (ref 25.1–34.0)
MCHC: 31.9 g/dL (ref 31.5–36.0)
MCV: 95.9 fL (ref 79.5–101.0)
MONO ABS: 0.5 10*3/uL (ref 0.1–0.9)
Monocytes Relative: 8 %
Neutro Abs: 5.1 10*3/uL (ref 1.5–6.5)
Neutrophils Relative %: 80 %
Platelets: 193 10*3/uL (ref 145–400)
RBC: 4.58 MIL/uL (ref 3.70–5.45)
RDW: 13.8 % (ref 11.2–14.5)
WBC: 6.3 10*3/uL (ref 3.9–10.3)

## 2017-09-26 LAB — TSH: TSH: 1.607 u[IU]/mL (ref 0.308–3.960)

## 2017-09-26 MED ORDER — ALTEPLASE 2 MG IJ SOLR
2.0000 mg | Freq: Once | INTRAMUSCULAR | Status: AC | PRN
  Administered 2017-09-26: 2 mg
  Filled 2017-09-26: qty 2

## 2017-09-26 MED ORDER — HEPARIN SOD (PORK) LOCK FLUSH 100 UNIT/ML IV SOLN
500.0000 [IU] | Freq: Once | INTRAVENOUS | Status: AC | PRN
  Administered 2017-09-26: 500 [IU]
  Filled 2017-09-26: qty 5

## 2017-09-26 MED ORDER — SODIUM CHLORIDE 0.9% FLUSH
10.0000 mL | INTRAVENOUS | Status: DC | PRN
  Administered 2017-09-26: 10 mL via INTRAVENOUS
  Filled 2017-09-26: qty 10

## 2017-09-26 MED ORDER — SODIUM CHLORIDE 0.9% FLUSH
10.0000 mL | INTRAVENOUS | Status: DC | PRN
  Administered 2017-09-26: 10 mL
  Filled 2017-09-26: qty 10

## 2017-09-26 MED ORDER — SODIUM CHLORIDE 0.9 % IV SOLN
Freq: Once | INTRAVENOUS | Status: AC
  Administered 2017-09-26: 13:00:00 via INTRAVENOUS

## 2017-09-26 MED ORDER — SODIUM CHLORIDE 0.9 % IV SOLN
10.2000 mg/kg | Freq: Once | INTRAVENOUS | Status: AC
  Administered 2017-09-26: 1120 mg via INTRAVENOUS
  Filled 2017-09-26: qty 20

## 2017-09-26 NOTE — Patient Instructions (Signed)
[facility] Discharge Instructions for Patients Receiving Chemotherapy  Today you received the following chemotherapy agents: Imfinzi.  To help prevent nausea and vomiting after your treatment, we encourage you to take your nausea medication as directed.   If you develop nausea and vomiting that is not controlled by your nausea medication, call the clinic.   BELOW ARE SYMPTOMS THAT SHOULD BE REPORTED IMMEDIATELY:  *FEVER GREATER THAN 100.5 F  *CHILLS WITH OR WITHOUT FEVER  NAUSEA AND VOMITING THAT IS NOT CONTROLLED WITH YOUR NAUSEA MEDICATION  *UNUSUAL SHORTNESS OF BREATH  *UNUSUAL BRUISING OR BLEEDING  TENDERNESS IN MOUTH AND THROAT WITH OR WITHOUT PRESENCE OF ULCERS  *URINARY PROBLEMS  *BOWEL PROBLEMS  UNUSUAL RASH Items with * indicate a potential emergency and should be followed up as soon as possible.  Feel free to call the clinic should you have any questions or concerns. The clinic phone number is [phone].  Please show the CHEMO ALERT CARD at check-in to the Emergency Department and triage nurse.   

## 2017-09-26 NOTE — Progress Notes (Signed)
[facility] [phone]   [phone]  OFFICE PROGRESS NOTE  [name], MD [address] [place] [address]  DIAGNOSIS: Unresectable stage IIIA (T4, N0, M0) non-small cell lung cancer, squamous cell carcinoma diagnosed in May 2018 and presented with large obstructing left lower lobe lung mass suspicious small mediastinal lymphadenopathy.  PRIOR THERAPY: A course of concurrent chemoradiation with weekly carboplatin for AUC of 2 and paclitaxel 45 MG/M2. Status post 5 cycles. She received her radiation in [facility]. Last dose of chemotherapy was given 11/27/2016.Marland Kitchen  CURRENT THERAPY: consolidation immunotherapy with Imfinzi (Durvalumab) 10 MG/KG every 2 weeks, first dose 02/21/2017.  Status post 15 cycles.  INTERVAL HISTORY: Sarah [name] 67 y.o. female returns to the clinic today for follow-up visit accompanied by her husband.  The patient is feeling fine today with no concerning complaints.  She continues to tolerate her treatment with Imfinzi (Durvalumab) fairly well.  She mentioned few episodes in the last 2 weeks of memory loss and she cannot remember the names or phone numbers including her husband's name.  She is much better today.  She denied having any recent fever or chills.  She has no nausea, vomiting, diarrhea or constipation.  She has no chest pain, shortness of breath, cough or hemoptysis.  The patient is here today for evaluation before starting cycle #16.  MEDICAL HISTORY: Past Medical History:  Diagnosis Date  . Arthritis   . Cancer ([place])    lung cancer  . Chronic anxiety    Patient describes as stress  . Chronic back pain   . DVT (deep venous thrombosis) ([place]) age 55  . GERD (gastroesophageal reflux disease)   . Goals of care, counseling/discussion 09/21/2016  . Headache(784.0)   . Hypoestrogenism   . Lower extremity venous stasis    LLE, chronic  . Obesity   . Odynophagia 11/13/2016  . Paroxysmal  atrial fibrillation ([facility])   . PONV (postoperative nausea and vomiting)   . Pulmonary embolism ([place])   . Reflux   . Sleep apnea    wear CPAP  . Spinal stenosis   . Tobacco abuse   . Venous stasis   . Warfarin anticoagulation     ALLERGIES:  is allergic to aspirin.  MEDICATIONS:  Current Outpatient Medications  Medication Sig Dispense Refill  . albuterol (PROVENTIL HFA;VENTOLIN HFA) 108 (90 Base) MCG/ACT inhaler Inhale 2 puffs into the lungs every 6 (six) hours as needed for wheezing or shortness of breath. 1 Inhaler 5  . budesonide-formoterol (SYMBICORT) 160-4.5 MCG/ACT inhaler Inhale 2 puffs into the lungs 2 (two) times daily. 1 Inhaler 12  . buPROPion (WELLBUTRIN SR) 150 MG 12 hr tablet TAKE 1 TABLET BY MOUTH TWICE DAILY **KEEP APPT ON 06/09/16** (Patient taking differently: Take 150 mg by mouth daily. TAKE 1 TABLET BY MOUTH TWICE DAILY **KEEP APPT ON 06/09/16**) 180 tablet 0  . calcium carbonate (TUMS - DOSED IN MG ELEMENTAL CALCIUM) 500 MG chewable tablet Chew 1 tablet by mouth 2 (two) times a week.     . diltiazem (CARDIZEM CD) 180 MG 24 hr capsule TAKE ONE CAPSULE BY MOUTH EVERY DAY 90 capsule 1  . flecainide (TAMBOCOR) 50 MG tablet TAKE 1 TABLET BY MOUTH TWICE A DAY 60 tablet 0  . HYDROcodone-acetaminophen (NORCO) 7.5-325 MG tablet One tablet up to three times a day as needed 90 tablet 0  . ketoconazole (NIZORAL) 2 % cream Apply BID for rash 30 g  2  . lidocaine-prilocaine (EMLA) cream Apply 1 application as needed topically. 30 g 2  . metoprolol tartrate (LOPRESSOR) 25 MG tablet TAKE 1 TABLET AS NEEDED FOR HEART RACING 10 tablet 5  . pantoprazole (PROTONIX) 40 MG tablet Take 1 tablet (40 mg total) by mouth every morning. 90 tablet 1  . prochlorperazine (COMPAZINE) 10 MG tablet Take 1 tablet (10 mg total) by mouth every 6 (six) hours as needed for nausea or vomiting. 30 tablet 0  . promethazine (PHENERGAN) 25 MG tablet TAKE 1 TABLET BY MOUTH EVERY 6 HOURS AS NEEDED FOR  NAUSEA/VOMITING 30 tablet 0  . warfarin (COUMADIN) 5 MG tablet Take 0-2.5 mg daily by mouth. Take 2.5 mg Daily except none on Monday and Thursday.    . Diphenhyd-Hydrocort-Nystatin (FIRST-DUKES MOUTHWASH) SUSP One tablespoon swish and spit qid (Patient not taking: Reported on 09/26/2017) 1 Bottle 0  . sucralfate (CARAFATE) 1 g tablet Take 1 tablet (1 g total) by mouth 2 (two) times daily. (Patient not taking: Reported on 09/26/2017) 60 tablet 0   No current facility-administered medications for this visit.     SURGICAL HISTORY:  Past Surgical History:  Procedure Laterality Date  . ANKLE SURGERY     x2  . APPENDECTOMY    . CARPAL TUNNEL RELEASE    . CHOLECYSTECTOMY    . IR FLUORO GUIDE PORT INSERTION RIGHT  03/19/2017  . IR US GUIDE VASC ACCESS RIGHT  03/19/2017  . OVARIAN CYST REMOVAL    . ROTATOR CUFF REPAIR    . TUBAL LIGATION    . VIDEO BRONCHOSCOPY WITH ENDOBRONCHIAL ULTRASOUND N/A 09/14/2016   Procedure: VIDEO BRONCHOSCOPY WITH ENDOBRONCHIAL ULTRASOUND;  Surgeon: Melrose Nakayama, MD;  Location: [facility] OR;  Service: Thoracic;  Laterality: N/A;    REVIEW OF SYSTEMS:  A comprehensive review of systems was negative except for: Respiratory: positive for dyspnea on exertion Neurological: positive for memory problems   PHYSICAL EXAMINATION: General appearance: alert, cooperative and no distress Head: Normocephalic, without obvious abnormality, atraumatic Neck: no adenopathy, no JVD, supple, symmetrical, trachea midline and thyroid not enlarged, symmetric, no tenderness/mass/nodules Lymph nodes: Cervical, supraclavicular, and axillary nodes normal. Resp: clear to auscultation bilaterally Back: symmetric, no curvature. ROM normal. No CVA tenderness. Cardio: regular rate and rhythm, S1, S2 normal, no murmur, click, rub or gallop GI: soft, non-tender; bowel sounds normal; no masses,  no organomegaly Extremities: extremities normal, atraumatic, no cyanosis or edema  ECOG PERFORMANCE  STATUS: 1 - Symptomatic but completely ambulatory  Blood pressure (!) 111/52, pulse 74, temperature 98.5 F (36.9 C), temperature source Oral, resp. rate 17, height 5\' 7"  (1.702 m), weight 230 lb 14.4 oz (104.7 kg), SpO2 96 %.  LABORATORY DATA: Lab Results  Component Value Date   WBC 6.3 09/26/2017   HGB 14.0 09/26/2017   HCT 43.9 09/26/2017   MCV 95.9 09/26/2017   PLT 193 09/26/2017      Chemistry      Component Value Date/Time   NA 143 09/26/2017 1043   NA 142 04/26/2017 1106   K 3.9 09/26/2017 1043   K 3.4 (L) 04/26/2017 1106   CL 103 09/26/2017 1043   CO2 28 09/26/2017 1043   CO2 29 04/26/2017 1106   BUN 8 09/26/2017 1043   BUN 7.5 04/26/2017 1106   CREATININE 1.06 09/26/2017 1043   CREATININE 0.9 04/26/2017 1106      Component Value Date/Time   CALCIUM 9.7 09/26/2017 1043   CALCIUM 9.1 04/26/2017 1106   ALKPHOS 100 09/26/2017  1043   ALKPHOS 99 04/26/2017 1106   AST 21 09/26/2017 1043   AST 14 04/26/2017 1106   ALT 23 09/26/2017 1043   ALT 12 04/26/2017 1106   BILITOT 0.3 09/26/2017 1043   BILITOT 0.28 04/26/2017 1106       RADIOGRAPHIC STUDIES: No results found.  ASSESSMENT AND PLAN:  This is a very pleasant 67 years old with unresectable a stage IIIA non-small cell lung cancer. The patient underwent a course of concurrent chemoradiation with weekly carboplatin and paclitaxel status post 8 cycles and has been tolerating this treatment fairly well except for mild fatigue and odynophagia. The patient is currently undergoing consolidation immunotherapy with Imfinzi (Durvalumab) status post 15 cycles. The patient continues to tolerate her treatment well with no concerning complaints.  I recommended for her to proceed with cycle #16 today as scheduled. For the memory loss, I will order MRI of the brain to rule out any brain metastasis or stroke. The patient will come back for follow-up visit in 2 weeks for reevaluation before the next cycle of her treatment. She  was advised to call immediately if she has any concerning symptoms in the interval. The patient voices understanding of current disease status and treatment options and is in agreement with the current care plan. All questions were answered. The patient knows to call the clinic with any problems, questions or concerns. We can certainly see the patient much sooner if necessary.  Disclaimer: This note was dictated with voice recognition software. Similar sounding words can inadvertently be transcribed and may not be corrected upon review.

## 2017-09-26 NOTE — Telephone Encounter (Signed)
Scheduled appt per 5/29 los - pt to get an updated schedule in the treatment area./

## 2017-09-26 NOTE — Progress Notes (Signed)
Pt's Port-A-Cath accessed, site flushes well, no blood return noted.  No swelling or pain noted at site. Cath -Flo instilled per policy, site marked. Will monitor for blood return.

## 2017-10-01 ENCOUNTER — Encounter: Payer: Self-pay | Admitting: Family Medicine

## 2017-10-01 ENCOUNTER — Ambulatory Visit (INDEPENDENT_AMBULATORY_CARE_PROVIDER_SITE_OTHER): Payer: Medicare Other | Admitting: Family Medicine

## 2017-10-01 VITALS — BP 136/94 | Ht 67.0 in | Wt 225.6 lb

## 2017-10-01 DIAGNOSIS — C3492 Malignant neoplasm of unspecified part of left bronchus or lung: Secondary | ICD-10-CM | POA: Diagnosis not present

## 2017-10-01 DIAGNOSIS — I4891 Unspecified atrial fibrillation: Secondary | ICD-10-CM | POA: Diagnosis not present

## 2017-10-01 DIAGNOSIS — J449 Chronic obstructive pulmonary disease, unspecified: Secondary | ICD-10-CM

## 2017-10-01 DIAGNOSIS — Z7901 Long term (current) use of anticoagulants: Secondary | ICD-10-CM

## 2017-10-01 LAB — POCT INR: INR: 4.5 — AB (ref 2.0–3.0)

## 2017-10-01 MED ORDER — HYDROCODONE-ACETAMINOPHEN 7.5-325 MG PO TABS: ORAL_TABLET | ORAL | 0 refills | Status: DC

## 2017-10-01 NOTE — Patient Instructions (Signed)
Recheck in 4 weeks; October 29 2017. Skip today's dose. Take one half (1/2) tablet rest of days

## 2017-10-01 NOTE — Progress Notes (Signed)
   Subjective:    Patient ID: Sarah [name], female    DOB: January 11, 1951, 67 y.o.   MRN: [id number]  HPI Pt here for 3 month follow up.  Forgetting mch more than usual, havent forgotten g children names  Goes to call a friend for over 21 yrs, forgot the number, had trouble with names an numbers usually no problem with in the past   Father had hx of electro shok thrapy and confusion  Mouth dry terrible , using prn otc dry mouth neds, still using cpp and other intervntios    No headache s no trouble with focal neuro deficit  Patient continues to maintain compliance with Coumadin.  Progressive weakness lately.  Also periods of forgetfulness.  Sometimes on the edge of confusion.  Concerning to patient and family understandably.  Patient's oncologist has ordered an MRI of the brain due to the mental status changes along with history of lung carcinoma   Results for orders placed or performed in visit on 10/01/17  POCT INR  Result Value Ref Range   INR 4.5 (A) 2.0 - 3.0    Review of Systems No headache, no major weight loss or weight gain, no chest pain no back pain abdominal pain no change in bowel habits complete ROS otherwise negative     Objective:   Physical Exam  Alert and oriented, vitals reviewed and stable, some weakness and mild malaise ENT-TM's and ext canals WNL bilat via otoscopic exam Soft palate, tonsils and post pharynx WNL via oropharyngeal exam Neck-symmetric, no masses; thyroid nonpalpable and nontender Pulmonary-no tachypnea or accessory muscle use; Clear without wheezes via auscultation Card--no abnrml murmurs, rhythm reg and rate WNL Carotid pulses symmetric, without bruits       Assessment & Plan:  1 chronic anticoagulation.  INR elevated today.  Coumadin adjusted.  2.  COPD.  Hard to sort out shortness of breath with history of #3.  Patient no longer smoking  3.  Lung carcinoma.  Under the care of oncologist  4.  Mental status  changes/certainly agree with MRI.  Further recommendations based on those results  Follow-up as scheduled  Greater than 50% of this 25 minute face to face visit was spent in counseling and discussion and coordination of care regarding the above diagnosis/diagnosies

## 2017-10-03 ENCOUNTER — Other Ambulatory Visit (HOSPITAL_COMMUNITY): Payer: Self-pay

## 2017-10-03 ENCOUNTER — Inpatient Hospital Stay (HOSPITAL_COMMUNITY)
Admission: EM | Admit: 2017-10-03 | Discharge: 2017-10-09 | DRG: 054 | Disposition: A | Discharge status: Skilled Nursing Facility | Payer: Medicare Other | Attending: Internal Medicine | Admitting: Internal Medicine

## 2017-10-03 ENCOUNTER — Emergency Department (HOSPITAL_COMMUNITY): Payer: Medicare Other

## 2017-10-03 ENCOUNTER — Encounter (HOSPITAL_COMMUNITY): Payer: Self-pay

## 2017-10-03 ENCOUNTER — Other Ambulatory Visit: Payer: Self-pay

## 2017-10-03 DIAGNOSIS — G936 Cerebral edema: Secondary | ICD-10-CM | POA: Diagnosis present

## 2017-10-03 DIAGNOSIS — Z800 Family history of malignant neoplasm of digestive organs: Secondary | ICD-10-CM

## 2017-10-03 DIAGNOSIS — J439 Emphysema, unspecified: Secondary | ICD-10-CM | POA: Diagnosis not present

## 2017-10-03 DIAGNOSIS — I482 Chronic atrial fibrillation: Secondary | ICD-10-CM | POA: Diagnosis not present

## 2017-10-03 DIAGNOSIS — M549 Dorsalgia, unspecified: Secondary | ICD-10-CM | POA: Diagnosis present

## 2017-10-03 DIAGNOSIS — Z7901 Long term (current) use of anticoagulants: Secondary | ICD-10-CM

## 2017-10-03 DIAGNOSIS — F331 Major depressive disorder, recurrent, moderate: Secondary | ICD-10-CM | POA: Diagnosis not present

## 2017-10-03 DIAGNOSIS — Z923 Personal history of irradiation: Secondary | ICD-10-CM | POA: Diagnosis not present

## 2017-10-03 DIAGNOSIS — Z7951 Long term (current) use of inhaled steroids: Secondary | ICD-10-CM | POA: Diagnosis not present

## 2017-10-03 DIAGNOSIS — R0602 Shortness of breath: Secondary | ICD-10-CM | POA: Diagnosis not present

## 2017-10-03 DIAGNOSIS — Z823 Family history of stroke: Secondary | ICD-10-CM

## 2017-10-03 DIAGNOSIS — Z87891 Personal history of nicotine dependence: Secondary | ICD-10-CM

## 2017-10-03 DIAGNOSIS — Z8249 Family history of ischemic heart disease and other diseases of the circulatory system: Secondary | ICD-10-CM

## 2017-10-03 DIAGNOSIS — Z9981 Dependence on supplemental oxygen: Secondary | ICD-10-CM | POA: Diagnosis not present

## 2017-10-03 DIAGNOSIS — F329 Major depressive disorder, single episode, unspecified: Secondary | ICD-10-CM | POA: Diagnosis present

## 2017-10-03 DIAGNOSIS — R531 Weakness: Secondary | ICD-10-CM | POA: Diagnosis not present

## 2017-10-03 DIAGNOSIS — C7931 Secondary malignant neoplasm of brain: Secondary | ICD-10-CM | POA: Diagnosis not present

## 2017-10-03 DIAGNOSIS — K219 Gastro-esophageal reflux disease without esophagitis: Secondary | ICD-10-CM | POA: Diagnosis present

## 2017-10-03 DIAGNOSIS — Z9221 Personal history of antineoplastic chemotherapy: Secondary | ICD-10-CM

## 2017-10-03 DIAGNOSIS — Z86718 Personal history of other venous thrombosis and embolism: Secondary | ICD-10-CM | POA: Diagnosis not present

## 2017-10-03 DIAGNOSIS — F419 Anxiety disorder, unspecified: Secondary | ICD-10-CM | POA: Diagnosis present

## 2017-10-03 DIAGNOSIS — Z66 Do not resuscitate: Secondary | ICD-10-CM | POA: Diagnosis present

## 2017-10-03 DIAGNOSIS — Z86711 Personal history of pulmonary embolism: Secondary | ICD-10-CM | POA: Diagnosis not present

## 2017-10-03 DIAGNOSIS — R279 Unspecified lack of coordination: Secondary | ICD-10-CM | POA: Diagnosis not present

## 2017-10-03 DIAGNOSIS — R41841 Cognitive communication deficit: Secondary | ICD-10-CM | POA: Diagnosis not present

## 2017-10-03 DIAGNOSIS — M6281 Muscle weakness (generalized): Secondary | ICD-10-CM | POA: Diagnosis not present

## 2017-10-03 DIAGNOSIS — R413 Other amnesia: Secondary | ICD-10-CM

## 2017-10-03 DIAGNOSIS — C3492 Malignant neoplasm of unspecified part of left bronchus or lung: Secondary | ICD-10-CM | POA: Diagnosis not present

## 2017-10-03 DIAGNOSIS — C3490 Malignant neoplasm of unspecified part of unspecified bronchus or lung: Secondary | ICD-10-CM | POA: Diagnosis not present

## 2017-10-03 DIAGNOSIS — J449 Chronic obstructive pulmonary disease, unspecified: Secondary | ICD-10-CM | POA: Diagnosis present

## 2017-10-03 DIAGNOSIS — I480 Paroxysmal atrial fibrillation: Secondary | ICD-10-CM | POA: Diagnosis present

## 2017-10-03 DIAGNOSIS — R278 Other lack of coordination: Secondary | ICD-10-CM | POA: Diagnosis not present

## 2017-10-03 DIAGNOSIS — C3432 Malignant neoplasm of lower lobe, left bronchus or lung: Secondary | ICD-10-CM | POA: Diagnosis present

## 2017-10-03 DIAGNOSIS — I69311 Memory deficit following cerebral infarction: Secondary | ICD-10-CM | POA: Diagnosis not present

## 2017-10-03 DIAGNOSIS — R51 Headache: Secondary | ICD-10-CM | POA: Diagnosis not present

## 2017-10-03 DIAGNOSIS — G939 Disorder of brain, unspecified: Secondary | ICD-10-CM | POA: Diagnosis not present

## 2017-10-03 DIAGNOSIS — M1990 Unspecified osteoarthritis, unspecified site: Secondary | ICD-10-CM | POA: Diagnosis not present

## 2017-10-03 DIAGNOSIS — G9389 Other specified disorders of brain: Secondary | ICD-10-CM | POA: Diagnosis present

## 2017-10-03 LAB — URINALYSIS, ROUTINE W REFLEX MICROSCOPIC
BILIRUBIN URINE: NEGATIVE
GLUCOSE, UA: NEGATIVE mg/dL
HGB URINE DIPSTICK: NEGATIVE
Ketones, ur: 5 mg/dL — AB
Leukocytes, UA: NEGATIVE
Nitrite: NEGATIVE
PH: 5 (ref 5.0–8.0)
Protein, ur: NEGATIVE mg/dL

## 2017-10-03 LAB — CBC WITH DIFFERENTIAL/PLATELET
BASOS ABS: 0 10*3/uL (ref 0.0–0.1)
Basophils Relative: 0 %
EOS ABS: 0.1 10*3/uL (ref 0.0–0.7)
EOS PCT: 1 %
HCT: 44 % (ref 36.0–46.0)
HEMOGLOBIN: 14.1 g/dL (ref 12.0–15.0)
LYMPHS PCT: 12 %
Lymphs Abs: 0.8 10*3/uL (ref 0.7–4.0)
MCH: 30.3 pg (ref 26.0–34.0)
MCHC: 32 g/dL (ref 30.0–36.0)
MCV: 94.6 fL (ref 78.0–100.0)
Monocytes Absolute: 0.7 10*3/uL (ref 0.1–1.0)
Monocytes Relative: 12 %
NEUTROS PCT: 75 %
Neutro Abs: 4.6 10*3/uL (ref 1.7–7.7)
PLATELETS: 186 10*3/uL (ref 150–400)
RBC: 4.65 MIL/uL (ref 3.87–5.11)
RDW: 13.9 % (ref 11.5–15.5)
WBC: 6.2 10*3/uL (ref 4.0–10.5)

## 2017-10-03 LAB — COMPREHENSIVE METABOLIC PANEL
ALT: 20 U/L (ref 14–54)
AST: 21 U/L (ref 15–41)
Albumin: 3.9 g/dL (ref 3.5–5.0)
Alkaline Phosphatase: 94 U/L (ref 38–126)
Anion gap: 10 (ref 5–15)
BUN: 7 mg/dL (ref 6–20)
CHLORIDE: 101 mmol/L (ref 101–111)
CO2: 31 mmol/L (ref 22–32)
CREATININE: 1.01 mg/dL — AB (ref 0.44–1.00)
Calcium: 9.4 mg/dL (ref 8.9–10.3)
GFR calc Af Amer: >60 mL/min (ref >60)
GFR calc non Af Amer: 57 mL/min — ABNORMAL LOW (ref >60)
GLUCOSE: 89 mg/dL (ref 65–99)
Potassium: 3.6 mmol/L (ref 3.5–5.1)
SODIUM: 142 mmol/L (ref 135–145)
Total Bilirubin: 0.5 mg/dL (ref 0.3–1.2)
Total Protein: 7.2 g/dL (ref 6.5–8.1)

## 2017-10-03 LAB — LIPASE, BLOOD: Lipase: 31 U/L (ref 11–51)

## 2017-10-03 LAB — PROTIME-INR
INR: 3.15
Prothrombin Time: 32.1 seconds — ABNORMAL HIGH (ref 11.4–15.2)

## 2017-10-03 MED ORDER — BUPROPION HCL ER (SR) 150 MG PO TB12
150.0000 mg | ORAL_TABLET | Freq: Every day | ORAL | Status: DC
  Administered 2017-10-04 – 2017-10-09 (×6): 150 mg via ORAL
  Filled 2017-10-03 (×7): qty 1

## 2017-10-03 MED ORDER — ONDANSETRON HCL 4 MG PO TABS: 4.0000 mg | ORAL_TABLET | Freq: Four times a day (QID) | ORAL | Status: DC | PRN

## 2017-10-03 MED ORDER — ACETAMINOPHEN 650 MG RE SUPP: 650.0000 mg | Freq: Four times a day (QID) | RECTAL | Status: DC | PRN

## 2017-10-03 MED ORDER — SODIUM CHLORIDE 0.9% FLUSH
3.0000 mL | Freq: Two times a day (BID) | INTRAVENOUS | Status: DC
  Administered 2017-10-05 – 2017-10-09 (×7): 3 mL via INTRAVENOUS

## 2017-10-03 MED ORDER — SODIUM CHLORIDE 0.9 % IV SOLN
INTRAVENOUS | Status: DC
  Administered 2017-10-03 – 2017-10-06 (×6): via INTRAVENOUS

## 2017-10-03 MED ORDER — IOHEXOL 300 MG/ML  SOLN
75.0000 mL | Freq: Once | INTRAMUSCULAR | Status: AC | PRN
  Administered 2017-10-03: 75 mL via INTRAVENOUS

## 2017-10-03 MED ORDER — SODIUM CHLORIDE 0.9 % IV SOLN: 250.0000 mL | INTRAVENOUS | Status: DC | PRN

## 2017-10-03 MED ORDER — ONDANSETRON HCL 4 MG/2ML IJ SOLN: 4.0000 mg | Freq: Four times a day (QID) | INTRAMUSCULAR | Status: DC | PRN

## 2017-10-03 MED ORDER — MOMETASONE FURO-FORMOTEROL FUM 200-5 MCG/ACT IN AERO
2.0000 | INHALATION_SPRAY | Freq: Two times a day (BID) | RESPIRATORY_TRACT | Status: DC
  Filled 2017-10-03 (×2): qty 8.8

## 2017-10-03 MED ORDER — ALBUTEROL SULFATE HFA 108 (90 BASE) MCG/ACT IN AERS: 2.0000 | INHALATION_SPRAY | Freq: Four times a day (QID) | RESPIRATORY_TRACT | Status: DC | PRN

## 2017-10-03 MED ORDER — PANTOPRAZOLE SODIUM 40 MG PO TBEC
40.0000 mg | DELAYED_RELEASE_TABLET | Freq: Every morning | ORAL | Status: DC
  Administered 2017-10-04 – 2017-10-09 (×6): 40 mg via ORAL
  Filled 2017-10-03 (×6): qty 1

## 2017-10-03 MED ORDER — FLECAINIDE ACETATE 50 MG PO TABS
50.0000 mg | ORAL_TABLET | Freq: Two times a day (BID) | ORAL | Status: DC
  Administered 2017-10-03 – 2017-10-09 (×12): 50 mg via ORAL
  Filled 2017-10-03 (×12): qty 1

## 2017-10-03 MED ORDER — DEXAMETHASONE SODIUM PHOSPHATE 4 MG/ML IJ SOLN
10.0000 mg | Freq: Once | INTRAMUSCULAR | Status: AC
  Administered 2017-10-03: 10 mg via INTRAVENOUS
  Filled 2017-10-03: qty 3

## 2017-10-03 MED ORDER — DILTIAZEM HCL ER COATED BEADS 180 MG PO CP24
180.0000 mg | ORAL_CAPSULE | Freq: Every day | ORAL | Status: DC
  Administered 2017-10-04 – 2017-10-09 (×6): 180 mg via ORAL
  Filled 2017-10-03 (×6): qty 1

## 2017-10-03 MED ORDER — ZOLPIDEM TARTRATE 5 MG PO TABS: 5.0000 mg | ORAL_TABLET | Freq: Every evening | ORAL | Status: DC | PRN

## 2017-10-03 MED ORDER — ALBUTEROL SULFATE (2.5 MG/3ML) 0.083% IN NEBU: 2.5000 mg | INHALATION_SOLUTION | Freq: Four times a day (QID) | RESPIRATORY_TRACT | Status: DC | PRN

## 2017-10-03 MED ORDER — SODIUM CHLORIDE 0.9% FLUSH: 3.0000 mL | INTRAVENOUS | Status: DC | PRN

## 2017-10-03 MED ORDER — HYDROCODONE-ACETAMINOPHEN 7.5-325 MG PO TABS
2.0000 | ORAL_TABLET | ORAL | Status: DC | PRN
  Administered 2017-10-04 – 2017-10-09 (×8): 2 via ORAL
  Filled 2017-10-03 (×8): qty 2

## 2017-10-03 MED ORDER — DEXAMETHASONE SODIUM PHOSPHATE 10 MG/ML IJ SOLN
10.0000 mg | Freq: Four times a day (QID) | INTRAMUSCULAR | Status: DC
  Administered 2017-10-03: 10 mg via INTRAVENOUS
  Filled 2017-10-03: qty 1

## 2017-10-03 MED ORDER — CALCIUM CARBONATE ANTACID 500 MG PO CHEW
1.0000 | CHEWABLE_TABLET | ORAL | Status: DC
  Administered 2017-10-04 – 2017-10-08 (×2): 200 mg via ORAL
  Filled 2017-10-03 (×2): qty 1

## 2017-10-03 MED ORDER — ACETAMINOPHEN 325 MG PO TABS: 650.0000 mg | ORAL_TABLET | Freq: Four times a day (QID) | ORAL | Status: DC | PRN

## 2017-10-03 MED ORDER — MORPHINE SULFATE (PF) 2 MG/ML IV SOLN: 2.0000 mg | INTRAVENOUS | Status: DC | PRN

## 2017-10-03 MED ORDER — GADOBENATE DIMEGLUMINE 529 MG/ML IV SOLN
20.0000 mL | Freq: Once | INTRAVENOUS | Status: AC | PRN
  Administered 2017-10-03: 20 mL via INTRAVENOUS

## 2017-10-03 NOTE — ED Triage Notes (Signed)
Pt has a history of lung cancer. In the last few weeks, pt has had worsening memory. Oncologist aware. Monday, pt had a doctor's appt and was ambulatory. Yesterday pt was weak and fell. Pt also fell today without injury. Pt on 2L O2 chronic. O2 sats 94-96%   History a fib BP 132/88

## 2017-10-03 NOTE — ED Provider Notes (Signed)
[facility] EMERGENCY DEPARTMENT Provider Note   CSN: [id number] Arrival date & time: 10/03/17  1317     History   Chief Complaint Chief Complaint  Patient presents with  . Weakness    HPI Sarah [name] is a 67 y.o. female.  Patient with known history of lung cancer and operable.  First diagnosed about a year ago.  Recently saw her oncologist Dr. Earlie Server on May 29.  She had complaints of memory problems at that time so an outpatient MRI was ordered but has not been completed yet.  Starting on Monday evening patient's had increased weakness generalized not strong enough to pull herself up or to bear weight.  Is not due to pain.  Seems to involve both legs.  Husband struggling to get her around at home because she cannot support her weight to week.  Memory problems have persisted.  Also patient's recent INR was markedly elevated at 4.  Patient is on Coumadin for atrial fibrillation.     Past Medical History:  Diagnosis Date  . Arthritis   . Cancer ([place])    lung cancer  . Chronic anxiety    Patient describes as stress  . Chronic back pain   . DVT (deep venous thrombosis) ([place]) age 89  . GERD (gastroesophageal reflux disease)   . Goals of care, counseling/discussion 09/21/2016  . Headache(784.0)   . Hypoestrogenism   . Lower extremity venous stasis    LLE, chronic  . Obesity   . Odynophagia 11/13/2016  . Paroxysmal atrial fibrillation ([facility])   . PONV (postoperative nausea and vomiting)   . Pulmonary embolism ([place])   . Reflux   . Sleep apnea    wear CPAP  . Spinal stenosis   . Tobacco abuse   . Venous stasis   . Warfarin anticoagulation     Patient Active Problem List   Diagnosis Date Noted  . Flank pain 04/11/2017  . Constipation 04/11/2017  . Port-A-Cath in place 03/28/2017  . Chronic fatigue 02/13/2017  . Encounter for antineoplastic immunotherapy 02/13/2017  . Chronic obstructive pulmonary disease (COPD) ([place]) 02/11/2017  . HCAP (healthcare-associated  pneumonia) 12/18/2016  . Odynophagia 11/13/2016  . Chemotherapy induced nausea and vomiting 10/12/2016  . Chemotherapy-induced nausea 10/12/2016  . Goals of care, counseling/discussion 09/21/2016  . Encounter for antineoplastic chemotherapy 09/21/2016  . Squamous cell carcinoma of lung, left ([place]) 09/20/2016  . Lung mass 09/15/2016  . Arthritis 06/23/2013  . Atrial fibrillation, chronic ([place]) 08/30/2012  . Obesity, unspecified 08/30/2012  . Hypokalemia 08/30/2012  . Long term current use of anticoagulant therapy 08/03/2012  . OSA (obstructive sleep apnea) 05/23/2012  . History of pulmonary embolism 12/28/2011  . Tobacco abuse 12/28/2011  . Chronic anxiety 12/28/2011  . GERD (gastroesophageal reflux disease) 12/28/2011  . Warfarin anticoagulation 12/28/2011    Past Surgical History:  Procedure Laterality Date  . ANKLE SURGERY     x2  . APPENDECTOMY    . CARPAL TUNNEL RELEASE    . CHOLECYSTECTOMY    . IR FLUORO GUIDE PORT INSERTION RIGHT  03/19/2017  . IR US GUIDE VASC ACCESS RIGHT  03/19/2017  . OVARIAN CYST REMOVAL    . ROTATOR CUFF REPAIR    . TUBAL LIGATION    . VIDEO BRONCHOSCOPY WITH ENDOBRONCHIAL ULTRASOUND N/A 09/14/2016   Procedure: VIDEO BRONCHOSCOPY WITH ENDOBRONCHIAL ULTRASOUND;  Surgeon: Melrose Nakayama, MD;  Location: [facility];  Service: Thoracic;  Laterality: N/A;     OB History   None  Home Medications    Prior to Admission medications   Medication Sig Start Date End Date Taking? Authorizing Provider  albuterol (PROVENTIL HFA;VENTOLIN HFA) 108 (90 Base) MCG/ACT inhaler Inhale 2 puffs into the lungs every 6 (six) hours as needed for wheezing or shortness of breath. 02/09/17  Yes [name], MD  budesonide-formoterol [facility]) 160-4.5 MCG/ACT inhaler Inhale 2 puffs into the lungs 2 (two) times daily. 02/13/17  Yes [name], MD  buPROPion (WELLBUTRIN SR) 150 MG 12 hr tablet TAKE 1 TABLET BY MOUTH TWICE DAILY **KEEP APPT ON  06/09/16** Patient taking differently: Take 150 mg by mouth daily. TAKE 1 TABLET BY MOUTH TWICE DAILY **KEEP APPT ON 06/09/16** 11/06/16  Yes [name], MD  calcium carbonate (TUMS - DOSED IN MG ELEMENTAL CALCIUM) 500 MG chewable tablet Chew 1 tablet by mouth 2 (two) times a week.    Yes [provider]  diltiazem (CARDIZEM CD) 180 MG 24 hr capsule TAKE ONE CAPSULE BY MOUTH EVERY DAY 04/23/17  Yes [name], MD  flecainide (TAMBOCOR) 50 MG tablet TAKE 1 TABLET BY MOUTH TWICE A DAY 09/05/17  Yes Satira Sark, MD  HYDROcodone-acetaminophen [facility]) 7.5-325 MG tablet One tablet up to three times a day as needed 10/01/17  Yes [name], MD  ketoconazole (NIZORAL) 2 % cream Apply BID for rash 02/09/17  Yes [name], MD  lidocaine-prilocaine (EMLA) cream Apply 1 application as needed topically. 03/06/17  Yes Curcio, Roselie Awkward, NP  pantoprazole (PROTONIX) 40 MG tablet Take 1 tablet (40 mg total) by mouth every morning. 07/16/17  Yes [name], MD  promethazine (PHENERGAN) 25 MG tablet TAKE 1 TABLET BY MOUTH EVERY 6 HOURS AS NEEDED FOR NAUSEA/VOMITING 08/31/17  Yes [name], MD  sucralfate (CARAFATE) 1 g tablet Take 1 tablet (1 g total) by mouth 2 (two) times daily. 11/06/16  Yes [name], MD  warfarin (COUMADIN) 5 MG tablet Take 0-2.5 mg by mouth daily. Take 2.5 mg Daily except none on Monday   Yes [provider]  Diphenhyd-Hydrocort-Nystatin (FIRST-DUKES MOUTHWASH) SUSP One tablespoon swish and spit qid Patient not taking: Reported on 09/26/2017 07/24/17   [name], MD  metoprolol tartrate (LOPRESSOR) 25 MG tablet TAKE 1 TABLET AS NEEDED FOR HEART RACING 01/08/17   [name], MD    Family History Family History  Problem Relation Age of Onset  . Arrhythmia Mother   . Cancer Mother        colon  . Heart disease Mother   . Stroke Father        Deceased    Social History Social History   Tobacco Use  . Smoking  status: Former Smoker    Packs/day: 1.00    Years: 40.00    Pack years: 40.00    Types: Cigarettes    Last attempt to quit: 08/29/2016    Years since quitting: 1.0  . Smokeless tobacco: Never Used  Substance Use Topics  . Alcohol use: No    Alcohol/week: 0.0 oz  . Drug use: No     Allergies   Aspirin   Review of Systems Review of Systems  Constitutional: Positive for fatigue. Negative for fever.  HENT: Negative for congestion.   Eyes: Negative for visual disturbance.  Respiratory: Positive for shortness of breath.   Cardiovascular: Negative for chest pain.  Gastrointestinal: Negative for abdominal pain.  Genitourinary: Negative for dysuria.  Musculoskeletal: Negative for back pain and neck  pain.  Skin: Negative for rash.  Allergic/Immunologic: Positive for immunocompromised state.  Neurological: Positive for weakness. Negative for dizziness, numbness and headaches.  Hematological: Bruises/bleeds easily.  Psychiatric/Behavioral: Negative for confusion.     Physical Exam Updated Vital Signs BP (!) 106/55   Pulse 69   Temp 98 F (36.7 C) (Oral)   Resp 12   Ht 1.702 m (5\' 7" )   Wt 104.8 kg (231 lb)   SpO2 97%   BMI 36.18 kg/m   Physical Exam  Constitutional: She is oriented to person, place, and time. She appears well-developed and well-nourished. No distress.  HENT:  Head: Normocephalic and atraumatic.  Mouth/Throat: Oropharynx is clear and moist.  Eyes: Pupils are equal, round, and reactive to light. Conjunctivae and EOM are normal.  Neck: Neck supple.  Cardiovascular: Normal rate, regular rhythm and normal heart sounds.  Pulmonary/Chest: Effort normal and breath sounds normal. No respiratory distress.  Abdominal: Soft. Bowel sounds are normal. There is no tenderness.  Musculoskeletal: Normal range of motion.  Neurological: She is alert and oriented to person, place, and time. No cranial nerve deficit or sensory deficit. She exhibits normal muscle tone.  Coordination normal.  Patient may be with some slight weakness to both lower extremities and arms but nothing significant.  Patient able to raise both up and hold them.  No obvious focal deficit.  Speech is normal.  Skin: Skin is warm.  Nursing note and vitals reviewed.    ED Treatments / Results  Labs (all labs ordered are listed, but only abnormal results are displayed) Labs Reviewed  PROTIME-INR - Abnormal; Notable for the following components:      Result Value   Prothrombin Time 32.1 (*)    All other components within normal limits  COMPREHENSIVE METABOLIC PANEL - Abnormal; Notable for the following components:   Creatinine, Ser 1.01 (*)    GFR calc non Af Amer 57 (*)    All other components within normal limits  CBC WITH DIFFERENTIAL/PLATELET  LIPASE, BLOOD  URINALYSIS, ROUTINE W REFLEX MICROSCOPIC    EKG None   ED ECG REPORT   Date: 10/03/2017  Rate: 77  Rhythm: normal sinus rhythm  QRS Axis: normal  Intervals: normal  ST/T Wave abnormalities: normal  Conduction Disutrbances:right bundle branch block  Narrative Interpretation:   Old EKG Reviewed: none available   Baseline wandering in leads I, III, aVL, aVF and V2  I have personally reviewed the EKG tracing and agree with the computerized printout as noted.   Radiology Mr Jeri Cos Wo Contrast  Result Date: 10/03/2017 CLINICAL DATA:  Headache and weakness. Neuro deficits, subacute. Lung cancer. EXAM: MRI HEAD WITHOUT AND WITH CONTRAST TECHNIQUE: Multiplanar, multiecho pulse sequences of the brain and surrounding structures were obtained without and with intravenous contrast. CONTRAST:  62mL MULTIHANCE GADOBENATE DIMEGLUMINE 529 MG/ML IV SOLN COMPARISON:  MRI brain 09/12/2016.  Whole-body PET scan 09/13/2016 FINDINGS: Brain: A peripherally enhancing heterogeneous mass lesion is present in the medial left temporal, parietal, and occipital lobe. The lesion measures 6.2 x 4.2 x 4.3 cm. There is involvement of the dura  along the interhemispheric falx, and the tentorium. The lesion crosses midline just below the corpus callosum. There is also involvement of the left ventricular ependyma. There is mass effect on the left ventricle. Surrounding T2 signal changes are compatible with vasogenic edema. Midline shift measures up to 8 mm. No distal enhancing lesions are present. Periventricular and subcortical white matter changes are otherwise stable. The right lateral ventricle  is stable in size. Left temporal horn is enlarged and may be partially occluded. There is some mass effect on the para mesencephalic cisterns. Brainstem and cerebellum are otherwise within normal limits. Vascular: Flow is present in the major intracranial arteries. Skull and upper cervical spine: The skull base is within normal limits. Visualized cervical spine is normal. Sinuses/Orbits: The paranasal sinuses and mastoid air cells are clear. Globes and orbits are within normal limits. IMPRESSION: 1. Heterogeneous predominantly peripherally enhancing mass lesion involving the medial left temporal, parietal, and occipital lobes measures 6.2 x 4.2 x 4.3 cm. This is most consistent with a solitary metastasis from the patient's known lung cancer. 2. The lesion involves the dura and invades the left ventricle with enhancement along the ependyma. 3. Mass effect with surrounding vasogenic edema and midline shift of up to 8 mm. 4. Dilation of the left temporal tip suggests obstruction by the mass. 5. Periventricular and subcortical white matter disease is otherwise stable. These results were called by telephone at the time of interpretation on 10/03/2017 at 3:37 pm to Dr. Fredia Sorrow , who verbally acknowledged these results. Electronically Signed   By: San Morelle M.D.   On: 10/03/2017 15:37    Procedures Procedures (including critical care time)  CRITICAL CARE Performed by: Fredia Sorrow Total critical care time: 30 minutes Critical care time was  exclusive of separately billable procedures and treating other patients. Critical care was necessary to treat or prevent imminent or life-threatening deterioration. Critical care was time spent personally by me on the following activities: development of treatment plan with patient and/or surrogate as well as nursing, discussions with consultants, evaluation of patient's response to treatment, examination of patient, obtaining history from patient or surrogate, ordering and performing treatments and interventions, ordering and review of laboratory studies, ordering and review of radiographic studies, pulse oximetry and re-evaluation of patient's condition.   Medications Ordered in ED Medications  0.9 %  sodium chloride infusion ( Intravenous New Bag/Given 10/03/17 1528)  gadobenate dimeglumine (MULTIHANCE) injection 20 mL (20 mLs Intravenous Contrast Given 10/03/17 1511)  dexamethasone (DECADRON) injection 10 mg (10 mg Intravenous Given 10/03/17 1549)     Initial Impression / Assessment and Plan / ED Course  I have reviewed the triage vital signs and the nursing notes.  Pertinent labs & imaging results that were available during my care of the patient were reviewed by me and considered in my medical decision making (see chart for details).     Work-up MRI brain shows a large left-sided missed metastatic mass involving the parietal and occipital area.  Has a fair amount of swelling a little bit of midline shift and is encroaching into the ventricle.  This most likely explains the memory problems.  Follow-up CT of chest is still pending.  Patient expressed a desire when told about this information that she wants to be a DNR.   Reviewed by Dr. Christella Noa neurosurgery and also discussed with on-call oncology.  Plan is to have patient admitted to [facility] where Dr.Mohammed can consult on her.  May be candidate for radiation oncology treatment to improve things some.  Patient may want  palliative care services or may want to consider hospice services.  Patient given Decadron here 10 mg IV for the swelling.  Hospitalist will see and make arrangements for admission to the hospital service at [facility].  Patient updated on all concerns.  Patient's INR here is 3.1 so significantly improved.    Final Clinical Impressions(s) / ED  Diagnoses   Final diagnoses:  Generalized weakness  Memory deficit  Primary malignant neoplasm of lung metastatic to other site, unspecified laterality [facility])  Brain metastasis [facility])    ED Discharge Orders    None       Fredia Sorrow, MD 10/03/17 1729

## 2017-10-03 NOTE — ED Notes (Signed)
Pt in MRI. Will draw blood work upon return

## 2017-10-03 NOTE — H&P (Signed)
[facility]                                                                                    Patient Demographics  Sarah [name], is a 67 y.o. female  CSN: [id number]  MRN: [id number]  DOB - June 28, 1950  Admit Date - 10/03/2017  Outpatient Primary MD for the patient is Wolfgang Phoenix Grace Bushy, MD   With History of -  Past Medical History:  Diagnosis Date  . Arthritis   . Cancer ([place])    lung cancer  . Chronic anxiety    Patient describes as stress  . Chronic back pain   . DVT (deep venous thrombosis) ([place]) age 53  . GERD (gastroesophageal reflux disease)   . Goals of care, counseling/discussion 09/21/2016  . Headache(784.0)   . Hypoestrogenism   . Lower extremity venous stasis    LLE, chronic  . Obesity   . Odynophagia 11/13/2016  . Paroxysmal atrial fibrillation (HCC)   . PONV (postoperative nausea and vomiting)   . Pulmonary embolism ([place])   . Reflux   . Sleep apnea    wear CPAP  . Spinal stenosis   . Tobacco abuse   . Venous stasis   . Warfarin anticoagulation       Past Surgical History:  Procedure Laterality Date  . ANKLE SURGERY     x2  . APPENDECTOMY    . CARPAL TUNNEL RELEASE    . CHOLECYSTECTOMY    . IR FLUORO GUIDE PORT INSERTION RIGHT  03/19/2017  . IR US GUIDE VASC ACCESS RIGHT  03/19/2017  . OVARIAN CYST REMOVAL    . ROTATOR CUFF REPAIR    . TUBAL LIGATION    . VIDEO BRONCHOSCOPY WITH ENDOBRONCHIAL ULTRASOUND N/A 09/14/2016   Procedure: VIDEO BRONCHOSCOPY WITH ENDOBRONCHIAL ULTRASOUND;  Surgeon: Melrose Nakayama, MD;  Location: [place];  Service: Thoracic;  Laterality: N/A;    in for   Chief Complaint  Patient presents with  . Weakness     HPI  Sarah [name]  is a 67 y.o. female, with past medical history significant for A. fib, and left lung cancer diagnosed 1 year ago, presenting with increased generalized weakness and worsening memory problems .  MRI of the brain done today, showed mass lesion involving the medial left  temporal, parietal and occipital lobes consistent with metastasis with mass-effect and vasogenic edema with midline shift of up to 8 mm.  The patient was started on steroids and is going to be transferred to [facility] to be followed by her oncologist Dr. Earlie Server for further management. Patient reports headaches, but no nausea or vomiting.  Family in the room and extremely supportive.  Patient decided to be DO NOT RESUSCITATE.   Review of Systems    In addition to the HPI above,  No Fever-chills, No problems swallowing food or Liquids, No Chest pain, Cough or Shortness of Breath, No Abdominal pain, No Nausea or Vommitting, Bowel movements are regular, No Blood in stool or Urine, No dysuria, No new skin rashes or bruises, No new joints pains-aches,  No recent weight gain or loss, No polyuria, polydypsia or polyphagia,   A  full 10 point Review of Systems was done, except as stated above, all other Review of Systems were negative.   Social History Social History   Tobacco Use  . Smoking status: Former Smoker    Packs/day: 1.00    Years: 40.00    Pack years: 40.00    Types: Cigarettes    Last attempt to quit: 08/29/2016    Years since quitting: 1.0  . Smokeless tobacco: Never Used  Substance Use Topics  . Alcohol use: No    Alcohol/week: 0.0 oz     Family History Family History  Problem Relation Age of Onset  . Arrhythmia Mother   . Cancer Mother        colon  . Heart disease Mother   . Stroke Father        Deceased     Prior to Admission medications   Medication Sig Start Date End Date Taking? Authorizing Provider  albuterol (PROVENTIL HFA;VENTOLIN HFA) 108 (90 Base) MCG/ACT inhaler Inhale 2 puffs into the lungs every 6 (six) hours as needed for wheezing or shortness of breath. 02/09/17  Yes [name], MD  budesonide-formoterol [facility]) 160-4.5 MCG/ACT inhaler Inhale 2 puffs into the lungs 2 (two) times daily. 02/13/17  Yes [name], MD   buPROPion (WELLBUTRIN SR) 150 MG 12 hr tablet TAKE 1 TABLET BY MOUTH TWICE DAILY **KEEP APPT ON 06/09/16** Patient taking differently: Take 150 mg by mouth daily. TAKE 1 TABLET BY MOUTH TWICE DAILY **KEEP APPT ON 06/09/16** 11/06/16  Yes [name], MD  calcium carbonate (TUMS - DOSED IN MG ELEMENTAL CALCIUM) 500 MG chewable tablet Chew 1 tablet by mouth 2 (two) times a week.    Yes [provider]  diltiazem (CARDIZEM CD) 180 MG 24 hr capsule TAKE ONE CAPSULE BY MOUTH EVERY DAY 04/23/17  Yes [name], MD  flecainide (TAMBOCOR) 50 MG tablet TAKE 1 TABLET BY MOUTH TWICE A DAY 09/05/17  Yes Satira Sark, MD  HYDROcodone-acetaminophen [facility]) 7.5-325 MG tablet One tablet up to three times a day as needed 10/01/17  Yes [name], MD  ketoconazole (NIZORAL) 2 % cream Apply BID for rash 02/09/17  Yes [name], MD  lidocaine-prilocaine (EMLA) cream Apply 1 application as needed topically. 03/06/17  Yes Curcio, Roselie Awkward, NP  pantoprazole (PROTONIX) 40 MG tablet Take 1 tablet (40 mg total) by mouth every morning. 07/16/17  Yes [name], MD  promethazine (PHENERGAN) 25 MG tablet TAKE 1 TABLET BY MOUTH EVERY 6 HOURS AS NEEDED FOR NAUSEA/VOMITING 08/31/17  Yes [name], MD  sucralfate (CARAFATE) 1 g tablet Take 1 tablet (1 g total) by mouth 2 (two) times daily. 11/06/16  Yes [name], MD  warfarin (COUMADIN) 5 MG tablet Take 0-2.5 mg by mouth daily. Take 2.5 mg Daily except none on Monday   Yes [provider]  Diphenhyd-Hydrocort-Nystatin (FIRST-DUKES MOUTHWASH) SUSP One tablespoon swish and spit qid Patient not taking: Reported on 09/26/2017 07/24/17   [name], MD  metoprolol tartrate (LOPRESSOR) 25 MG tablet TAKE 1 TABLET AS NEEDED FOR HEART RACING 01/08/17   [name], MD    Allergies  Allergen Reactions  . Aspirin Nausea And Vomiting    Physical Exam  Vitals  Blood pressure (!) 106/55, pulse 69, temperature  98 F (36.7 C), temperature source Oral, resp. rate 12, height 5\' 7"  (1.702 m), weight 104.8 kg (231 lb), SpO2 97 %.   1. General well-developed,  well-nourished, extremely pleasant  2. Normal affect and insight, Not Suicidal or Homicidal, Awake Alert, Oriented X 3.  3. No F.N deficits, ALL C.Nerves Intact, Strength 5/5 all 4 extremities, Sensation intact all 4 extremities, Plantars down going.  4. Ears and Eyes appear Normal, Conjunctivae clear, PERRLA. Moist Oral Mucosa.  5. Supple Neck, No JVD, No cervical lymphadenopathy appriciated, No Carotid Bruits.  6. Symmetrical Chest wall movement, Good air movement bilaterally, decreased breath sounds on the left lower side.  7.  Irregularly irregular, Rubs or Murmurs, No Parasternal Heave.  8. Positive Bowel Sounds, Abdomen Soft, Non tender, No organomegaly appriciated,No rebound -guarding or rigidity.  9.  No Cyanosis, Normal Skin Turgor, No Skin Rash or Bruise.  10. Good muscle tone,  joints appear normal , no effusions, Normal ROM.  11. No Palpable Lymph Nodes in Neck or Axillae    Data Review  CBC Recent Labs  Lab 10/03/17 1530  WBC 6.2  HGB 14.1  HCT 44.0  PLT 186  MCV 94.6  MCH 30.3  MCHC 32.0  RDW 13.9  LYMPHSABS 0.8  MONOABS 0.7  EOSABS 0.1  BASOSABS 0.0   ------------------------------------------------------------------------------------------------------------------  Chemistries  Recent Labs  Lab 10/03/17 1530  NA 142  K 3.6  CL 101  CO2 31  GLUCOSE 89  BUN 7  CREATININE 1.01*  CALCIUM 9.4  AST 21  ALT 20  ALKPHOS 94  BILITOT 0.5   ------------------------------------------------------------------------------------------------------------------ estimated creatinine clearance is 68.2 mL/min (A) (by C-G formula based on SCr of 1.01 mg/dL (H)). ------------------------------------------------------------------------------------------------------------------ No results for input(s): TSH, T4TOTAL,  T3FREE, THYROIDAB in the last 72 hours.  Invalid input(s): FREET3   Coagulation profile Recent Labs  Lab 10/01/17 1440 10/03/17 1530  INR 4.5* 3.15   ------------------------------------------------------------------------------------------------------------------- No results for input(s): DDIMER in the last 72 hours. -------------------------------------------------------------------------------------------------------------------  Cardiac Enzymes No results for input(s): CKMB, TROPONINI, MYOGLOBIN in the last 168 hours.  Invalid input(s): CK ------------------------------------------------------------------------------------------------------------------ Invalid input(s): POCBNP   ---------------------------------------------------------------------------------------------------------------  Urinalysis    Component Value Date/Time   COLORURINE YELLOW 12/19/2016 0126   APPEARANCEUR HAZY (A) 12/19/2016 0126   LABSPEC 1.016 12/19/2016 0126   PHURINE 5.0 12/19/2016 0126   GLUCOSEU NEGATIVE 12/19/2016 0126   HGBUR NEGATIVE 12/19/2016 0126   BILIRUBINUR NEGATIVE 12/19/2016 0126   KETONESUR NEGATIVE 12/19/2016 0126   PROTEINUR 1 plus 05/22/2017 1403   PROTEINUR NEGATIVE 12/19/2016 0126   NITRITE + 04/12/2017 1107   NITRITE NEGATIVE 12/19/2016 0126   LEUKOCYTESUR Moderate (2+) (A) 05/22/2017 1403    ----------------------------------------------------------------------------------------------------------------   Imaging results:   Ct Chest W Contrast  Result Date: 10/03/2017 CLINICAL DATA:  67 year old female with increasing confusion for the past several weeks. Patient has chronic shortness of breath and cough. History of lung cancer. EXAM: CT CHEST WITH CONTRAST TECHNIQUE: Multidetector CT imaging of the chest was performed during intravenous contrast administration. CONTRAST:  64mL OMNIPAQUE IOHEXOL 300 MG/ML  SOLN COMPARISON:  Chest CT 08/13/2017. FINDINGS:  Cardiovascular: Heart size is normal. There is no significant pericardial fluid, thickening or pericardial calcification. Dilatation of the pulmonic trunk (3.8 cm in diameter). Aortic atherosclerosis. No definite coronary artery calcifications. Right internal jugular single-lumen PermCath with tip terminating in the mid superior vena cava. Mediastinum/Nodes: No pathologically enlarged mediastinal or hilar lymph nodes. Esophagus is unremarkable in appearance. No axillary lymphadenopathy. Lungs/Pleura: Previously treated left lower lobe mass is no longer clearly evident. There is some amorphous soft tissue in the region of the treated mass which roughly measures 3.3 x 6.0 cm (  axial image 60 of series 2), however, which portion of this tissue is simply collapsed lung versus which portion represents residual or recurrent tumor is unclear on today's examination. Adjacent to this abnormal tissue there is near complete obstruction of the left lower lobe bronchus best appreciated on axial image 68 of series 2. Extensive atelectasis is present in the left lower lobe, in addition to additional areas of ground-glass attenuation, septal thickening, thickening of the peribronchovascular interstitium and regional architectural distortion throughout the left upper lobe which likely reflects chronic postradiation changes. Trace chronic left pleural effusion, similar to the prior study. A small amount of scarring is also noted in the medial segment of the right middle lobe and medial aspect of the right upper lobe. Diffuse bronchial wall thickening with mild to moderate centrilobular emphysema. No new suspicious appearing pulmonary nodules or masses are noted. Upper Abdomen: Status post cholecystectomy. 3.0 x 2.8 cm right adrenal nodule, similar to prior examinations, previously characterized as an adenoma. 2.4 cm simple cyst in the upper pole of the right kidney. Musculoskeletal: There are no aggressive appearing lytic or blastic  lesions noted in the visualized portions of the skeleton. IMPRESSION: 1. Post treatment related changes in the left lung, with overall stable appearance of the left lung including poorly defined mass-like area in the posterior aspect of the left lower lobe, as discussed above. 2. No acute findings are noted. 3. Dilatation of the pulmonic trunk (3.8 cm in diameter), concerning for pulmonary arterial hypertension. 4. Diffuse bronchial wall thickening with mild to moderate centrilobular emphysema. 5. Additional incidental findings, as above. Aortic Atherosclerosis (ICD10-I70.0) and Emphysema (ICD10-J43.9). Electronically Signed   By: Vinnie Langton M.D.   On: 10/03/2017 17:38   Mr Jeri Cos TW Contrast  Result Date: 10/03/2017 CLINICAL DATA:  Headache and weakness. Neuro deficits, subacute. Lung cancer. EXAM: MRI HEAD WITHOUT AND WITH CONTRAST TECHNIQUE: Multiplanar, multiecho pulse sequences of the brain and surrounding structures were obtained without and with intravenous contrast. CONTRAST:  34mL MULTIHANCE GADOBENATE DIMEGLUMINE 529 MG/ML IV SOLN COMPARISON:  MRI brain 09/12/2016.  Whole-body PET scan 09/13/2016 FINDINGS: Brain: A peripherally enhancing heterogeneous mass lesion is present in the medial left temporal, parietal, and occipital lobe. The lesion measures 6.2 x 4.2 x 4.3 cm. There is involvement of the dura along the interhemispheric falx, and the tentorium. The lesion crosses midline just below the corpus callosum. There is also involvement of the left ventricular ependyma. There is mass effect on the left ventricle. Surrounding T2 signal changes are compatible with vasogenic edema. Midline shift measures up to 8 mm. No distal enhancing lesions are present. Periventricular and subcortical white matter changes are otherwise stable. The right lateral ventricle is stable in size. Left temporal horn is enlarged and may be partially occluded. There is some mass effect on the para mesencephalic cisterns.  Brainstem and cerebellum are otherwise within normal limits. Vascular: Flow is present in the major intracranial arteries. Skull and upper cervical spine: The skull base is within normal limits. Visualized cervical spine is normal. Sinuses/Orbits: The paranasal sinuses and mastoid air cells are clear. Globes and orbits are within normal limits. IMPRESSION: 1. Heterogeneous predominantly peripherally enhancing mass lesion involving the medial left temporal, parietal, and occipital lobes measures 6.2 x 4.2 x 4.3 cm. This is most consistent with a solitary metastasis from the patient's known lung cancer. 2. The lesion involves the dura and invades the left ventricle with enhancement along the ependyma. 3. Mass effect with surrounding vasogenic edema and  midline shift of up to 8 mm. 4. Dilation of the left temporal tip suggests obstruction by the mass. 5. Periventricular and subcortical white matter disease is otherwise stable. These results were called by telephone at the time of interpretation on 10/03/2017 at 3:37 pm to Dr. Fredia Sorrow , who verbally acknowledged these results. Electronically Signed   By: San Morelle M.D.   On: 10/03/2017 15:37      Assessment & Plan  1.  Metastatic lung cancer with brain metastasis and significant midline shift of the brain. 2.  Atrial fibrillation on Coumadin 3.  Chronic back pain 4.  History of DVTs  Plan  Continue with Coumadin Transfer to [facility] to be managed by her primary oncologist Dr. Earlie Server Start on steroids    DVT Prophylaxis Coumadin  AM Labs Ordered, also please review Full Orders  Family Communication: Admission, patients condition and plan of care including tests being ordered have been discussed with the patient and husband who indicate understanding and agree with the plan and Code Status.  Code Status DNR  Disposition Plan: Undetermined  Time spent in minutes : 42 minutes  Condition GUARDED   @SIGNATURE @

## 2017-10-03 NOTE — Progress Notes (Signed)
Patient ID: Sarah [name], female   DOB: 1951/04/01, 68 y.o.   MRN: [id number] Films reviewed. Will see patient in the am. No emergency resection is indicated. Give decadron, 6mg  Q 6, and Keppra 500mg  bid.

## 2017-10-03 NOTE — ED Notes (Signed)
Have given pt a full liquid diet

## 2017-10-03 NOTE — ED Notes (Signed)
HUGE MASS!

## 2017-10-03 NOTE — Progress Notes (Signed)
ANTICOAGULATION CONSULT NOTE - Initial Consult  Pharmacy Consult for warfarin Indication: atrial fibrillation  Allergies  Allergen Reactions  . Aspirin Nausea And Vomiting    Patient Measurements: Height: 5\' 7"  (170.2 cm) Weight: 231 lb (104.8 kg) IBW/kg (Calculated) : 61.6  Vital Signs: Temp: 98 F (36.7 C) (06/05 1319) Temp Source: Oral (06/05 1319) BP: 105/56 (06/05 1818) Pulse Rate: 71 (06/05 1818)  Labs: Recent Labs    10/01/17 1440 10/03/17 1530  HGB  --  14.1  HCT  --  44.0  PLT  --  186  LABPROT  --  32.1*  INR 4.5* 3.15  CREATININE  --  1.01*    Estimated Creatinine Clearance: 68.2 mL/min (A) (by C-G formula based on SCr of 1.01 mg/dL (H)).   Medical History: Past Medical History:  Diagnosis Date  . Arthritis   . Cancer ([place])    lung cancer  . Chronic anxiety    Patient describes as stress  . Chronic back pain   . DVT (deep venous thrombosis) ([place]) age 52  . GERD (gastroesophageal reflux disease)   . Goals of care, counseling/discussion 09/21/2016  . Headache(784.0)   . Hypoestrogenism   . Lower extremity venous stasis    LLE, chronic  . Obesity   . Odynophagia 11/13/2016  . Paroxysmal atrial fibrillation (HCC)   . PONV (postoperative nausea and vomiting)   . Pulmonary embolism ([place])   . Reflux   . Sleep apnea    wear CPAP  . Spinal stenosis   . Tobacco abuse   . Venous stasis   . Warfarin anticoagulation     Medications:   (Not in a hospital admission)  Assessment: Pharmacy consulted to dose warfarin for patient with atrial fibrillation. INR on admission is 3.15. Patient's home dose is 2.5 mg daily besides does not take a dose on mondays.  Goal of Therapy:  INR 2-3 Monitor platelets by anticoagulation protocol: Yes   Plan:  Hold warfarin x 1 dose Monitor daily labs/INR and s/s of bleeding  Ramond Craver 10/03/2017,6:27 PM

## 2017-10-04 ENCOUNTER — Ambulatory Visit (HOSPITAL_COMMUNITY): Payer: Medicare Other

## 2017-10-04 ENCOUNTER — Ambulatory Visit
Admit: 2017-10-04 | Discharge: 2017-10-04 | Disposition: A | Discharge status: Home / Self Care | Payer: Medicare Other | Source: Ambulatory Visit | Attending: Radiation Oncology | Admitting: Radiation Oncology

## 2017-10-04 ENCOUNTER — Encounter: Payer: Self-pay | Admitting: Radiation Therapy

## 2017-10-04 DIAGNOSIS — Z510 Encounter for antineoplastic radiation therapy: Secondary | ICD-10-CM | POA: Insufficient documentation

## 2017-10-04 DIAGNOSIS — C7931 Secondary malignant neoplasm of brain: Secondary | ICD-10-CM

## 2017-10-04 DIAGNOSIS — E669 Obesity, unspecified: Secondary | ICD-10-CM | POA: Insufficient documentation

## 2017-10-04 DIAGNOSIS — Z9181 History of falling: Secondary | ICD-10-CM | POA: Insufficient documentation

## 2017-10-04 DIAGNOSIS — K219 Gastro-esophageal reflux disease without esophagitis: Secondary | ICD-10-CM | POA: Insufficient documentation

## 2017-10-04 DIAGNOSIS — R590 Localized enlarged lymph nodes: Secondary | ICD-10-CM | POA: Insufficient documentation

## 2017-10-04 DIAGNOSIS — Z86718 Personal history of other venous thrombosis and embolism: Secondary | ICD-10-CM | POA: Insufficient documentation

## 2017-10-04 DIAGNOSIS — C3490 Malignant neoplasm of unspecified part of unspecified bronchus or lung: Secondary | ICD-10-CM | POA: Insufficient documentation

## 2017-10-04 LAB — PROTIME-INR
INR: 2.86
PROTHROMBIN TIME: 29.8 s — AB (ref 11.4–15.2)

## 2017-10-04 MED ORDER — DEXAMETHASONE SODIUM PHOSPHATE 10 MG/ML IJ SOLN
6.0000 mg | Freq: Four times a day (QID) | INTRAMUSCULAR | Status: DC
  Administered 2017-10-04 – 2017-10-09 (×21): 6 mg via INTRAVENOUS
  Filled 2017-10-04 (×22): qty 1

## 2017-10-04 MED ORDER — LIDOCAINE 5 % EX OINT
TOPICAL_OINTMENT | Freq: Once | CUTANEOUS | Status: AC
  Administered 2017-10-04: 04:00:00 via TOPICAL
  Filled 2017-10-04: qty 35.44

## 2017-10-04 MED ORDER — WARFARIN - PHARMACIST DOSING INPATIENT
Freq: Every day | Status: DC
  Administered 2017-10-07: 18:00:00

## 2017-10-04 MED ORDER — LEVETIRACETAM 500 MG PO TABS
500.0000 mg | ORAL_TABLET | Freq: Two times a day (BID) | ORAL | Status: DC
  Administered 2017-10-04 – 2017-10-09 (×11): 500 mg via ORAL
  Filled 2017-10-04 (×11): qty 1

## 2017-10-04 MED ORDER — WARFARIN SODIUM 2.5 MG PO TABS
2.5000 mg | ORAL_TABLET | Freq: Once | ORAL | Status: AC
  Administered 2017-10-04: 2.5 mg via ORAL
  Filled 2017-10-04: qty 1

## 2017-10-04 NOTE — Progress Notes (Addendum)
ANTICOAGULATION CONSULT NOTE - Follow-Up   Pharmacy Consult for warfarin Indication: atrial fibrillation  Allergies  Allergen Reactions  . Aspirin Nausea And Vomiting    Patient Measurements: Height: 5\' 9"  (175.3 cm)(stated) Weight: 231 lb 0.7 oz (104.8 kg) IBW/kg (Calculated) : 66.2  Vital Signs: Temp: 99.2 F (37.3 C) (06/06 0603) Temp Source: Oral (06/06 0603) BP: 121/54 (06/06 0603) Pulse Rate: 62 (06/06 0603)  Labs: Recent Labs    10/01/17 1440 10/03/17 1530 10/04/17 0825  HGB  --  14.1  --   HCT  --  44.0  --   PLT  --  186  --   LABPROT  --  32.1* 29.8*  INR 4.5* 3.15 2.86  CREATININE  --  1.01*  --     Estimated Creatinine Clearance: 70.6 mL/min (A) (by C-G formula based on SCr of 1.01 mg/dL (H)).   Medical History: Past Medical History:  Diagnosis Date  . Arthritis   . Cancer ([place])    lung cancer  . Chronic anxiety    Patient describes as stress  . Chronic back pain   . DVT (deep venous thrombosis) ([place]) age 5  . GERD (gastroesophageal reflux disease)   . Goals of care, counseling/discussion 09/21/2016  . Headache(784.0)   . Hypoestrogenism   . Lower extremity venous stasis    LLE, chronic  . Obesity   . Odynophagia 11/13/2016  . Paroxysmal atrial fibrillation ([facility])   . PONV (postoperative nausea and vomiting)   . Pulmonary embolism ([place])   . Reflux   . Sleep apnea    wear CPAP  . Spinal stenosis   . Tobacco abuse   . Venous stasis   . Warfarin anticoagulation      Assessment: Pharmacy consulted to dose warfarin for patient with atrial fibrillation. INR on admission elevated at 3.15 and warfarin dose held on evening of 6/5 due to elevated INR. INR this morning within goal range at 2.86. Hemoglobin and platelets are stable. No noted concerns with bleeding at this time. Of note, patient also started on IV decadron this admission--this could lead to increased or decreased effects of warfarin and will monitor closely.   PTA warfarin dose:  2.5mg  PO daily, except NO dose on Mondays  Last PTA dose noted to be on 6/4 per med rec.   Goal of Therapy:  INR 2-3 Monitor platelets by anticoagulation protocol: Yes   Plan:  Warfarin 2.5mg  PO x 1 dose  Monitor daily CBC/INR Watch for s/sx of bleeding  Jalene Mullet, Pharm.D. PGY1 Pharmacy Resident 10/04/2017 10:20 AM

## 2017-10-04 NOTE — Progress Notes (Signed)
Patient ID: Sarah [name], female   DOB: Jul 10, 1950, 67 y.o.   MRN: [id number]                                                                PROGRESS NOTE                                                                                                                                                                                                             Patient Demographics:    Sarah [name], is a 67 y.o. female, DOB - Jan 05, 1951, [id number]  Admit date - 10/03/2017   Admitting Physician Merton Border, MD  Outpatient Primary MD for the patient is Wolfgang Phoenix Grace Bushy, MD  LOS - 1  Outpatient Specialists:  Chief Complaint  Patient presents with  . Weakness       Brief Narrative  67 y.o. female, with past medical history significant for A. fib, and left lung cancer diagnosed 1 year ago, presenting with increased generalized weakness and worsening memory problems .  MRI of the brain done today, showed mass lesion involving the medial left temporal, parietal and occipital lobes consistent with metastasis with mass-effect and vasogenic edema with midline shift of up to 8 mm.  The patient was started on steroids and is going to be transferred to [facility] to be followed by her oncologist Dr. Earlie Server for further management. Patient reports headaches, but no nausea or vomiting.  Family in the room and extremely supportive.  Patient decided to be DO NOT RESUSCITATE.     Subjective:    Sarah [name] today has difficulty with her memory, slight headache. Typically on home o2.  Pt afebrile overnite.   Able to move all 4 ext.   No chest pain, No abdominal pain - No Nausea, No new weakness tingling or numbness, No Cough - SOB.    Assessment  & Plan :    Active Problems:   Brain mass  Metastatic lung cancer with brain mets and midline shift Start keppra 500mg  po bid Decrease decadron to 6mg  iv q6h Appreciate neurosurgery input Oncology consult, appreciate input  Pafib Cont  Cardizem CD 180mg  po qday Cont Flecainide 50mg  po bid Coumadin pharmacy to dose  H/o DVT Cont coumadin pharmacy to dose  Copd Cont symbicort=> dulera Cont albuterol  Gerd Cont PPI  Chronic back pain Cont Norco 7.5/325mg  2 po q4h prn          Code Status :  FULL CODE  Family Communication  : w patient  Disposition Plan  :  home  Barriers For Discharge :   Consults  :  Oncology, neurosurgery  Procedures  :   MRI brain 10/03/2017,  CT chest 10/03/2017  DVT Prophylaxis  :  Coumadin  Lab Results  Component Value Date   PLT 186 10/03/2017    Antibiotics  :  none  Anti-infectives (From admission, onward)   None        Objective:   Vitals:   10/03/17 2000 10/03/17 2030 10/03/17 2100 10/03/17 2300  BP: (!) 102/42 (!) 107/56 (!) 114/56 104/73  Pulse:    66  Resp:    18  Temp:    98.4 F (36.9 C)  TempSrc:    Oral  SpO2:    93%  Weight:    104.8 kg (231 lb 0.7 oz)  Height:    5\' 9"  (1.753 m)    Wt Readings from Last 3 Encounters:  10/03/17 104.8 kg (231 lb 0.7 oz)  10/01/17 102.3 kg (225 lb 9.6 oz)  09/26/17 104.7 kg (230 lb 14.4 oz)     Intake/Output Summary (Last 24 hours) at 10/04/2017 0521 Last data filed at 10/04/2017 0400 Gross per 24 hour  Intake 940 ml  Output -  Net 940 ml     Physical Exam  Awake Alert, Oriented X 3, No new F.N deficits, Normal affect [place].AT,PERRAL Supple Neck,No JVD, No cervical lymphadenopathy appriciated.  Symmetrical Chest wall movement, Good air movement bilaterally, CTAB RRR,No Gallops,Rubs or new Murmurs, No Parasternal Heave +ve B.Sounds, Abd Soft, No tenderness, No organomegaly appriciated, No rebound - guarding or rigidity. No Cyanosis, Clubbing or edema, No new Rash or bruise      Data Review:    CBC Recent Labs  Lab 10/03/17 1530  WBC 6.2  HGB 14.1  HCT 44.0  PLT 186  MCV 94.6  MCH 30.3  MCHC 32.0  RDW 13.9  LYMPHSABS 0.8  MONOABS 0.7  EOSABS 0.1  BASOSABS 0.0    Chemistries  Recent  Labs  Lab 10/03/17 1530  NA 142  K 3.6  CL 101  CO2 31  GLUCOSE 89  BUN 7  CREATININE 1.01*  CALCIUM 9.4  AST 21  ALT 20  ALKPHOS 94  BILITOT 0.5   ------------------------------------------------------------------------------------------------------------------ No results for input(s): CHOL, HDL, LDLCALC, TRIG, CHOLHDL, LDLDIRECT in the last 72 hours.  No results found for: HGBA1C ------------------------------------------------------------------------------------------------------------------ No results for input(s): TSH, T4TOTAL, T3FREE, THYROIDAB in the last 72 hours.  Invalid input(s): FREET3 ------------------------------------------------------------------------------------------------------------------ No results for input(s): VITAMINB12, FOLATE, FERRITIN, TIBC, IRON, RETICCTPCT in the last 72 hours.  Coagulation profile Recent Labs  Lab 10/01/17 1440 10/03/17 1530  INR 4.5* 3.15    No results for input(s): DDIMER in the last 72 hours.  Cardiac Enzymes No results for input(s): CKMB, TROPONINI, MYOGLOBIN in the last 168 hours.  Invalid input(s): CK ------------------------------------------------------------------------------------------------------------------    Component Value Date/Time   BNP 18.0 03/03/2017 0533    Inpatient Medications  Scheduled Meds: . buPROPion  150 mg Oral Daily  . calcium carbonate  1 tablet Oral Once per day on Mon Thu  . dexamethasone  6 mg Intravenous Q6H  . diltiazem  180 mg Oral Daily  . flecainide  50 mg Oral BID  . levETIRAcetam  500 mg  Oral BID  . mometasone-formoterol  2 puff Inhalation BID  . pantoprazole  40 mg Oral q morning - 10a  . sodium chloride flush  3 mL Intravenous Q12H   Continuous Infusions: . sodium chloride 75 mL/hr at 10/03/17 1528  . sodium chloride     PRN Meds:.sodium chloride, acetaminophen **OR** acetaminophen, albuterol, HYDROcodone-acetaminophen, morphine injection, ondansetron **OR**  ondansetron (ZOFRAN) IV, sodium chloride flush, zolpidem  Micro Results No results found for this or any previous visit (from the past 240 hour(s)).  Radiology Reports Ct Chest W Contrast  Result Date: 10/03/2017 CLINICAL DATA:  67 year old female with increasing confusion for the past several weeks. Patient has chronic shortness of breath and cough. History of lung cancer. EXAM: CT CHEST WITH CONTRAST TECHNIQUE: Multidetector CT imaging of the chest was performed during intravenous contrast administration. CONTRAST:  53mL OMNIPAQUE IOHEXOL 300 MG/ML  SOLN COMPARISON:  Chest CT 08/13/2017. FINDINGS: Cardiovascular: Heart size is normal. There is no significant pericardial fluid, thickening or pericardial calcification. Dilatation of the pulmonic trunk (3.8 cm in diameter). Aortic atherosclerosis. No definite coronary artery calcifications. Right internal jugular single-lumen PermCath with tip terminating in the mid superior vena cava. Mediastinum/Nodes: No pathologically enlarged mediastinal or hilar lymph nodes. Esophagus is unremarkable in appearance. No axillary lymphadenopathy. Lungs/Pleura: Previously treated left lower lobe mass is no longer clearly evident. There is some amorphous soft tissue in the region of the treated mass which roughly measures 3.3 x 6.0 cm (axial image 60 of series 2), however, which portion of this tissue is simply collapsed lung versus which portion represents residual or recurrent tumor is unclear on today's examination. Adjacent to this abnormal tissue there is near complete obstruction of the left lower lobe bronchus best appreciated on axial image 68 of series 2. Extensive atelectasis is present in the left lower lobe, in addition to additional areas of ground-glass attenuation, septal thickening, thickening of the peribronchovascular interstitium and regional architectural distortion throughout the left upper lobe which likely reflects chronic postradiation changes. Trace  chronic left pleural effusion, similar to the prior study. A small amount of scarring is also noted in the medial segment of the right middle lobe and medial aspect of the right upper lobe. Diffuse bronchial wall thickening with mild to moderate centrilobular emphysema. No new suspicious appearing pulmonary nodules or masses are noted. Upper Abdomen: Status post cholecystectomy. 3.0 x 2.8 cm right adrenal nodule, similar to prior examinations, previously characterized as an adenoma. 2.4 cm simple cyst in the upper pole of the right kidney. Musculoskeletal: There are no aggressive appearing lytic or blastic lesions noted in the visualized portions of the skeleton. IMPRESSION: 1. Post treatment related changes in the left lung, with overall stable appearance of the left lung including poorly defined mass-like area in the posterior aspect of the left lower lobe, as discussed above. 2. No acute findings are noted. 3. Dilatation of the pulmonic trunk (3.8 cm in diameter), concerning for pulmonary arterial hypertension. 4. Diffuse bronchial wall thickening with mild to moderate centrilobular emphysema. 5. Additional incidental findings, as above. Aortic Atherosclerosis (ICD10-I70.0) and Emphysema (ICD10-J43.9). Electronically Signed   By: Vinnie Langton M.D.   On: 10/03/2017 17:38   Mr Jeri Cos WP Contrast  Result Date: 10/03/2017 CLINICAL DATA:  Headache and weakness. Neuro deficits, subacute. Lung cancer. EXAM: MRI HEAD WITHOUT AND WITH CONTRAST TECHNIQUE: Multiplanar, multiecho pulse sequences of the brain and surrounding structures were obtained without and with intravenous contrast. CONTRAST:  35mL MULTIHANCE GADOBENATE DIMEGLUMINE 529 MG/ML IV  SOLN COMPARISON:  MRI brain 09/12/2016.  Whole-body PET scan 09/13/2016 FINDINGS: Brain: A peripherally enhancing heterogeneous mass lesion is present in the medial left temporal, parietal, and occipital lobe. The lesion measures 6.2 x 4.2 x 4.3 cm. There is involvement of  the dura along the interhemispheric falx, and the tentorium. The lesion crosses midline just below the corpus callosum. There is also involvement of the left ventricular ependyma. There is mass effect on the left ventricle. Surrounding T2 signal changes are compatible with vasogenic edema. Midline shift measures up to 8 mm. No distal enhancing lesions are present. Periventricular and subcortical white matter changes are otherwise stable. The right lateral ventricle is stable in size. Left temporal horn is enlarged and may be partially occluded. There is some mass effect on the para mesencephalic cisterns. Brainstem and cerebellum are otherwise within normal limits. Vascular: Flow is present in the major intracranial arteries. Skull and upper cervical spine: The skull base is within normal limits. Visualized cervical spine is normal. Sinuses/Orbits: The paranasal sinuses and mastoid air cells are clear. Globes and orbits are within normal limits. IMPRESSION: 1. Heterogeneous predominantly peripherally enhancing mass lesion involving the medial left temporal, parietal, and occipital lobes measures 6.2 x 4.2 x 4.3 cm. This is most consistent with a solitary metastasis from the patient's known lung cancer. 2. The lesion involves the dura and invades the left ventricle with enhancement along the ependyma. 3. Mass effect with surrounding vasogenic edema and midline shift of up to 8 mm. 4. Dilation of the left temporal tip suggests obstruction by the mass. 5. Periventricular and subcortical white matter disease is otherwise stable. These results were called by telephone at the time of interpretation on 10/03/2017 at 3:37 pm to Dr. Fredia Sorrow , who verbally acknowledged these results. Electronically Signed   By: San Morelle M.D.   On: 10/03/2017 15:37    Time Spent in minutes  30   Jani Gravel M.D on 10/04/2017 at 5:21 AM  Between 7am to 7pm - Pager - [phone]  After 7pm go to [email or web address] - password  [facility]  [facility] -  Office  [phone]

## 2017-10-04 NOTE — Progress Notes (Signed)
Patient:s friend  Removed a small tick from the top right side of her head. The tick was fulling in tact

## 2017-10-04 NOTE — Progress Notes (Addendum)
I received a call from Dr. Christella Noa making [place] aware of this patient's hospital admission. Ashlyn Bruning PA-C will see Sarah [name] today as an IP for Dr. Lisbeth Renshaw. I also left a message on Dr. Worthy Flank nurse line to let him know that she is in the hospital. We will need to get a 3T SRS Protocol MRI with an open brain lab field for her treatment and surgical planning. Ashlyn will enter in an order for this to be done at [facility] via [facility].   We will then work with Dr. Christella Noa and his surgical scheduler to get things set up for Pre-operative SRS.   10/05/17: Dr. Christella Noa met with Sarah [name] on 6/6 to discuss her treatment/surgical options. She is not interested in having surgery at this time. Rather than treating with Pre-Operative SRS, the plan now is to treat with fractionated SRS alone. Once the planning MRI is completed we will move forward with simulation and preparation for her treatment.   Mont Dutton R.T.(R)(T) Special Procedures Navigator  [phone]

## 2017-10-04 NOTE — Consult Note (Signed)
Reason for Consult:metastatic lung ca to brain Referring Physician: Georges Mouse  Sarah [name] is an 67 y.o. female.  HPI: with a history of Unresectable stage IIIA(T4, N0, M0) non-small cell lung cancer, squamous cell carcinoma diagnosed in May 2018 and presented with large obstructing left lower lobe lung mass suspicious small mediastinal lymphadenopathy. She had been receiving chemotherapy and radiation to the chest for the tumor, and had been responding nicely. The family and [name] have noticed memory problems, some difficulty with word finding, and increasing minor confusion. She was seen last week, 5/29, by Dr. Julien Nordmann and the family recounted her difficulties with her memory. An MRI brain was ordered and performed yesterday at [facility]. She also yesterday had two falls within 12 hours, which led the family to bring her to the [facility] ED. The MRI revealed a large ring enhancing mass infiltrating the left occipital lobe and ventricular horn, with mass effect. This was felt to represent a solitary metastatic tumor originating with the lung. Transferred to [facility] for surgical evaluation and possible radiation treatment. No seizure activity noted. Does report difficulty seeing the right half of the world.  Hx of DVT, and falls described as collapsing to the floor with her legs giving out.   Past Medical History:  Diagnosis Date  . Arthritis   . Cancer ([place])    lung cancer  . Chronic anxiety    Patient describes as stress  . Chronic back pain   . DVT (deep venous thrombosis) ([place]) age 59  . GERD (gastroesophageal reflux disease)   . Goals of care, counseling/discussion 09/21/2016  . Headache(784.0)   . Hypoestrogenism   . Lower extremity venous stasis    LLE, chronic  . Obesity   . Odynophagia 11/13/2016  . Paroxysmal atrial fibrillation ([facility])   . PONV (postoperative nausea and vomiting)   . Pulmonary embolism ([place])   . Reflux   . Sleep apnea    wear CPAP  . Spinal  stenosis   . Tobacco abuse   . Venous stasis   . Warfarin anticoagulation     Past Surgical History:  Procedure Laterality Date  . ANKLE SURGERY     x2  . APPENDECTOMY    . CARPAL TUNNEL RELEASE    . CHOLECYSTECTOMY    . IR FLUORO GUIDE PORT INSERTION RIGHT  03/19/2017  . IR US GUIDE VASC ACCESS RIGHT  03/19/2017  . OVARIAN CYST REMOVAL    . ROTATOR CUFF REPAIR    . TUBAL LIGATION    . VIDEO BRONCHOSCOPY WITH ENDOBRONCHIAL ULTRASOUND N/A 09/14/2016   Procedure: VIDEO BRONCHOSCOPY WITH ENDOBRONCHIAL ULTRASOUND;  Surgeon: Melrose Nakayama, MD;  Location: [facility] OR;  Service: Thoracic;  Laterality: N/A;    Family History  Problem Relation Age of Onset  . Arrhythmia Mother   . Cancer Mother        colon  . Heart disease Mother   . Stroke Father        Deceased    Social History:  reports that she quit smoking about 13 months ago. Her smoking use included cigarettes. She has a 40.00 pack-year smoking history. She has never used smokeless tobacco. She reports that she does not drink alcohol or use drugs.  Allergies:  Allergies  Allergen Reactions  . Aspirin Nausea And Vomiting    Medications: I have reviewed the patient's current medications.  Results for orders placed or performed during the hospital encounter of 10/03/17 (from the past 48 hour(s))  Protime-INR     Status: Abnormal   Collection Time: 10/03/17  3:30 PM  Result Value Ref Range   Prothrombin Time 32.1 (H) 11.4 - 15.2 seconds   INR 3.15     Comment: Performed at [facility], [address] [place], [place] [address]  CBC with Differential/Platelet     Status: None   Collection Time: 10/03/17  3:30 PM  Result Value Ref Range   WBC 6.2 4.0 - 10.5 K/uL   RBC 4.65 3.87 - 5.11 MIL/uL   Hemoglobin 14.1 12.0 - 15.0 g/dL   HCT 44.0 36.0 - 46.0 %   MCV 94.6 78.0 - 100.0 fL   MCH 30.3 26.0 - 34.0 pg   MCHC 32.0 30.0 - 36.0 g/dL   RDW 13.9 11.5 - 15.5 %   Platelets 186 150 - 400 K/uL   Neutrophils Relative %  75 %   Neutro Abs 4.6 1.7 - 7.7 K/uL   Lymphocytes Relative 12 %   Lymphs Abs 0.8 0.7 - 4.0 K/uL   Monocytes Relative 12 %   Monocytes Absolute 0.7 0.1 - 1.0 K/uL   Eosinophils Relative 1 %   Eosinophils Absolute 0.1 0.0 - 0.7 K/uL   Basophils Relative 0 %   Basophils Absolute 0.0 0.0 - 0.1 K/uL    Comment: Performed at [facility], [address], [place], [place] [address]  Comprehensive metabolic panel     Status: Abnormal   Collection Time: 10/03/17  3:30 PM  Result Value Ref Range   Sodium 142 135 - 145 mmol/L   Potassium 3.6 3.5 - 5.1 mmol/L   Chloride 101 101 - 111 mmol/L   CO2 31 22 - 32 mmol/L   Glucose, Bld 89 65 - 99 mg/dL   BUN 7 6 - 20 mg/dL   Creatinine, Ser 1.01 (H) 0.44 - 1.00 mg/dL   Calcium 9.4 8.9 - 10.3 mg/dL   Total Protein 7.2 6.5 - 8.1 g/dL   Albumin 3.9 3.5 - 5.0 g/dL   AST 21 15 - 41 U/L   ALT 20 14 - 54 U/L   Alkaline Phosphatase 94 38 - 126 U/L   Total Bilirubin 0.5 0.3 - 1.2 mg/dL   GFR calc non Af Amer 57 (L) >60 mL/min   GFR calc Af Amer >60 >60 mL/min    Comment: (NOTE) The eGFR has been calculated using the CKD EPI equation. This calculation has not been validated in all clinical situations. eGFR's persistently <60 mL/min signify possible Chronic Kidney Disease.    Anion gap 10 5 - 15    Comment: Performed at [facility], [address] [place], [place] [address]  Lipase, blood     Status: None   Collection Time: 10/03/17  3:30 PM  Result Value Ref Range   Lipase 31 11 - 51 U/L    Comment: Performed at [facility], [address] [place], [place] [address]  Urinalysis, Routine w reflex microscopic     Status: Abnormal   Collection Time: 10/03/17  8:20 PM  Result Value Ref Range   Color, Urine YELLOW YELLOW   APPearance CLEAR CLEAR   Specific Gravity, Urine >1.046 (H) 1.005 - 1.030   pH 5.0 5.0 - 8.0   Glucose, UA NEGATIVE NEGATIVE mg/dL   Hgb urine dipstick NEGATIVE NEGATIVE   Bilirubin Urine NEGATIVE NEGATIVE   Ketones, ur 5 (A)  NEGATIVE mg/dL   Protein, ur NEGATIVE NEGATIVE mg/dL   Nitrite NEGATIVE NEGATIVE   Leukocytes, UA NEGATIVE  NEGATIVE    Comment: Performed at [facility], [address], [place], [place] [address]  Protime-INR     Status: Abnormal   Collection Time: 10/04/17  8:25 AM  Result Value Ref Range   Prothrombin Time 29.8 (H) 11.4 - 15.2 seconds   INR 2.86     Comment: Performed at [facility], [place] [address], [place], [place] [address]    Ct Chest W Contrast  Result Date: 10/03/2017 CLINICAL DATA:  67 year old female with increasing confusion for the past several weeks. Patient has chronic shortness of breath and cough. History of lung cancer. EXAM: CT CHEST WITH CONTRAST TECHNIQUE: Multidetector CT imaging of the chest was performed during intravenous contrast administration. CONTRAST:  87m OMNIPAQUE IOHEXOL 300 MG/ML  SOLN COMPARISON:  Chest CT 08/13/2017. FINDINGS: Cardiovascular: Heart size is normal. There is no significant pericardial fluid, thickening or pericardial calcification. Dilatation of the pulmonic trunk (3.8 cm in diameter). Aortic atherosclerosis. No definite coronary artery calcifications. Right internal jugular single-lumen PermCath with tip terminating in the mid superior vena cava. Mediastinum/Nodes: No pathologically enlarged mediastinal or hilar lymph nodes. Esophagus is unremarkable in appearance. No axillary lymphadenopathy. Lungs/Pleura: Previously treated left lower lobe mass is no longer clearly evident. There is some amorphous soft tissue in the region of the treated mass which roughly measures 3.3 x 6.0 cm (axial image 60 of series 2), however, which portion of this tissue is simply collapsed lung versus which portion represents residual or recurrent tumor is unclear on today's examination. Adjacent to this abnormal tissue there is near complete obstruction of the left lower lobe bronchus best appreciated on axial image 68 of series 2. Extensive atelectasis  is present in the left lower lobe, in addition to additional areas of ground-glass attenuation, septal thickening, thickening of the peribronchovascular interstitium and regional architectural distortion throughout the left upper lobe which likely reflects chronic postradiation changes. Trace chronic left pleural effusion, similar to the prior study. A small amount of scarring is also noted in the medial segment of the right middle lobe and medial aspect of the right upper lobe. Diffuse bronchial wall thickening with mild to moderate centrilobular emphysema. No new suspicious appearing pulmonary nodules or masses are noted. Upper Abdomen: Status post cholecystectomy. 3.0 x 2.8 cm right adrenal nodule, similar to prior examinations, previously characterized as an adenoma. 2.4 cm simple cyst in the upper pole of the right kidney. Musculoskeletal: There are no aggressive appearing lytic or blastic lesions noted in the visualized portions of the skeleton. IMPRESSION: 1. Post treatment related changes in the left lung, with overall stable appearance of the left lung including poorly defined mass-like area in the posterior aspect of the left lower lobe, as discussed above. 2. No acute findings are noted. 3. Dilatation of the pulmonic trunk (3.8 cm in diameter), concerning for pulmonary arterial hypertension. 4. Diffuse bronchial wall thickening with mild to moderate centrilobular emphysema. 5. Additional incidental findings, as above. Aortic Atherosclerosis (ICD10-I70.0) and Emphysema (ICD10-J43.9). Electronically Signed   By: DVinnie LangtonM.D.   On: 10/03/2017 17:38   Mr BJeri CosWBJContrast  Result Date: 10/03/2017 CLINICAL DATA:  Headache and weakness. Neuro deficits, subacute. Lung cancer. EXAM: MRI HEAD WITHOUT AND WITH CONTRAST TECHNIQUE: Multiplanar, multiecho pulse sequences of the brain and surrounding structures were obtained without and with intravenous contrast. CONTRAST:  240mMULTIHANCE GADOBENATE  DIMEGLUMINE 529 MG/ML IV SOLN COMPARISON:  MRI brain 09/12/2016.  Whole-body PET scan 09/13/2016 FINDINGS: Brain: A peripherally enhancing heterogeneous mass  lesion is present in the medial left temporal, parietal, and occipital lobe. The lesion measures 6.2 x 4.2 x 4.3 cm. There is involvement of the dura along the interhemispheric falx, and the tentorium. The lesion crosses midline just below the corpus callosum. There is also involvement of the left ventricular ependyma. There is mass effect on the left ventricle. Surrounding T2 signal changes are compatible with vasogenic edema. Midline shift measures up to 8 mm. No distal enhancing lesions are present. Periventricular and subcortical white matter changes are otherwise stable. The right lateral ventricle is stable in size. Left temporal horn is enlarged and may be partially occluded. There is some mass effect on the para mesencephalic cisterns. Brainstem and cerebellum are otherwise within normal limits. Vascular: Flow is present in the major intracranial arteries. Skull and upper cervical spine: The skull base is within normal limits. Visualized cervical spine is normal. Sinuses/Orbits: The paranasal sinuses and mastoid air cells are clear. Globes and orbits are within normal limits. IMPRESSION: 1. Heterogeneous predominantly peripherally enhancing mass lesion involving the medial left temporal, parietal, and occipital lobes measures 6.2 x 4.2 x 4.3 cm. This is most consistent with a solitary metastasis from the patient's known lung cancer. 2. The lesion involves the dura and invades the left ventricle with enhancement along the ependyma. 3. Mass effect with surrounding vasogenic edema and midline shift of up to 8 mm. 4. Dilation of the left temporal tip suggests obstruction by the mass. 5. Periventricular and subcortical white matter disease is otherwise stable. These results were called by telephone at the time of interpretation on 10/03/2017 at 3:37 pm to Dr.  Fredia Sorrow , who verbally acknowledged these results. Electronically Signed   By: San Morelle M.D.   On: 10/03/2017 15:37    ROS Blood pressure 98/63, pulse 73, temperature 98.3 F (36.8 C), temperature source Oral, resp. rate 18, height '5\' 9"'$  (1.753 m), weight 104.8 kg (231 lb 0.7 oz), SpO2 97 %. Physical Exam  Constitutional: She is oriented to person, place, and time. She appears well-developed and well-nourished. No distress.  HENT:  Head: Normocephalic and atraumatic.  Right Ear: External ear normal.  Left Ear: External ear normal.  Nose: Nose normal.  Mouth/Throat: Oropharynx is clear and moist.  Eyes: Pupils are equal, round, and reactive to light. Conjunctivae and EOM are normal.  Neck: Normal range of motion. Neck supple.  Cardiovascular: Normal rate, regular rhythm and normal heart sounds.  Respiratory: Effort normal.  GI: Soft. Bowel sounds are normal.  Musculoskeletal: Normal range of motion.  Neurological: She is alert and oriented to person, place, and time. She has normal strength. A cranial nerve deficit is present. No sensory deficit. GCS eye subscore is 4. GCS verbal subscore is 5. GCS motor subscore is 6.  Right fairly dense homonymous hemianopsia Proprioception is intact in all extremities She is not displaying acalculia, agraphia Difficulty with right left discrimination No extinction, no drift Full eom Gait not assessed    Assessment/Plan: Tumor and possible treatment options were discussed. The tumor is not curable with surgery, it infiltrates the occipital horn of the lateral ventricle, and I do not believe all symptoms would benefit from resection. As of now, Mrs. Pingree is not desirous of surgery. I will speak to them again tomorrow. I believe I answered their questions but of course will answer any questions that may arise. Continue decadron, and the Keppra.  Tranesha Lessner L 10/04/2017, 5:28 PM

## 2017-10-05 ENCOUNTER — Encounter: Payer: Self-pay | Admitting: Radiation Therapy

## 2017-10-05 ENCOUNTER — Ambulatory Visit (HOSPITAL_COMMUNITY)
Admit: 2017-10-05 | Discharge: 2017-10-05 | Disposition: A | Discharge status: Home / Self Care | Payer: Medicare Other | Attending: Urology | Admitting: Urology

## 2017-10-05 DIAGNOSIS — C3490 Malignant neoplasm of unspecified part of unspecified bronchus or lung: Secondary | ICD-10-CM | POA: Diagnosis not present

## 2017-10-05 DIAGNOSIS — Z923 Personal history of irradiation: Secondary | ICD-10-CM

## 2017-10-05 DIAGNOSIS — C7931 Secondary malignant neoplasm of brain: Principal | ICD-10-CM

## 2017-10-05 DIAGNOSIS — R0602 Shortness of breath: Secondary | ICD-10-CM

## 2017-10-05 DIAGNOSIS — Z9221 Personal history of antineoplastic chemotherapy: Secondary | ICD-10-CM

## 2017-10-05 DIAGNOSIS — C3492 Malignant neoplasm of unspecified part of left bronchus or lung: Secondary | ICD-10-CM

## 2017-10-05 LAB — CBC
HEMATOCRIT: 38.3 % (ref 36.0–46.0)
HEMOGLOBIN: 12.5 g/dL (ref 12.0–15.0)
MCH: 30.8 pg (ref 26.0–34.0)
MCHC: 32.6 g/dL (ref 30.0–36.0)
MCV: 94.3 fL (ref 78.0–100.0)
Platelets: 194 10*3/uL (ref 150–400)
RBC: 4.06 MIL/uL (ref 3.87–5.11)
RDW: 13.7 % (ref 11.5–15.5)
WBC: 13 10*3/uL — ABNORMAL HIGH (ref 4.0–10.5)

## 2017-10-05 LAB — PROTIME-INR
INR: 3.4
PROTHROMBIN TIME: 34.1 s — AB (ref 11.4–15.2)

## 2017-10-05 MED ORDER — GADOBENATE DIMEGLUMINE 529 MG/ML IV SOLN
20.0000 mL | Freq: Once | INTRAVENOUS | Status: AC
  Administered 2017-10-05: 20 mL via INTRAVENOUS

## 2017-10-05 MED ORDER — ENSURE ENLIVE PO LIQD
237.0000 mL | Freq: Two times a day (BID) | ORAL | Status: DC
  Administered 2017-10-06 – 2017-10-09 (×7): 237 mL via ORAL

## 2017-10-05 NOTE — Progress Notes (Signed)
Initial Nutrition Assessment  DOCUMENTATION CODES:   Obesity unspecified  INTERVENTION:   - Ensure Enlive po BID, each supplement provides 350 kcal and 20 grams of protein  NUTRITION DIAGNOSIS:   Increased nutrient needs related to catabolic illness, cancer and cancer related treatments as evidenced by estimated needs.  GOAL:   Patient will meet greater than or equal to 90% of their needs  MONITOR:   PO intake, Supplement acceptance, Weight trends, I & O's  REASON FOR ASSESSMENT:   Malnutrition Screening Tool    ASSESSMENT:   67 year old female who presented to the ED with weakness. PMH significant for atrial fibrillation, COPD, GERD, and L lung cancer diagnosed 1 year ago. MRI of the brain done in the ED showed mass lesion consistent with metastasis.  Per Oncology MD note, pt "has a history of a stage IIIA non-small cell lung cancer diagnosed in May 2018 status post course of concurrent chemoradiation with partial response followed by consolidation treatment with immunotherapy with Imfinzi (Durvalumab) status post 16 cycles."  Spoke with pt and significant other at bedside. 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 Pt reports being on a liquid diet for 4 days although pt's significant other reports it has only been 2 days. Pt reports not having an appetite but states she ate about 75% of a hamburger for lunch. Pt states that she typically eats 2 meals daily. Pt may occasionally have toast with butter or oatmeal for breakfast, but this does not happen daily. Pt tells this RD that she does not like the food here.  Pt endorses weight fluctuations, stating that "once I got up to the 260's, I decided I wanted to lose weight and kept my weight good." Pt reports her most recent UBW as 220 lbs. Pt states that she enjoys Ensure and would like to receive it during current hospitalization to maximize protein intake as appetite improves.  Per  weight history in chart, pt's weight has fluctuated between 225 lbs and 239 lbs since March 2019.  Meal Completion: 100%  Medications reviewed and include: calcium carbonate daily, 40 mg Protonix daily, warfarin  Labs reviewed: WBC 13.0 (H)  UOP: 700 ml x 24 hours  NUTRITION - FOCUSED PHYSICAL EXAM:    Most Recent Value  Orbital Region  No depletion  Upper Arm Region  No depletion  Thoracic and Lumbar Region  No depletion  Buccal Region  No depletion  Temple Region  No depletion  Clavicle Bone Region  No depletion  Clavicle and Acromion Bone Region  No depletion  Scapular Bone Region  No depletion  Dorsal Hand  No depletion  Patellar Region  No depletion  Anterior Thigh Region  Mild depletion  Posterior Calf Region  Mild depletion  Edema (RD Assessment)  None  Hair  Reviewed  Eyes  Reviewed  Mouth  Reviewed  Skin  Reviewed  Nails  Reviewed       Diet Order:   Diet Order           Diet regular Room service appropriate? Yes; Fluid consistency: Thin  Diet effective now          EDUCATION NEEDS:   No education needs have been identified at this time  Skin:  Skin Assessment: Reviewed RN Assessment  Last BM:  unknown/PTA  Height:   Ht Readings from Last 1 Encounters:  10/03/17 5\' 9"  (1.753 m)    Weight:   Wt Readings from Last 1 Encounters:  10/03/17 231 lb 0.7 oz (104.8 kg)    Ideal Body Weight:  65.9 kg  BMI:  Body mass index is 34.12 kg/m.  Estimated Nutritional Needs:   Kcal:  2400-2600 kcal/day  Protein:  110-125 grams/day  Fluid:  2.4-2.6 L/day    Gaynell Face, MS, RD, LDN Pager: [phone] Weekend/After Hours: [phone]

## 2017-10-05 NOTE — Progress Notes (Addendum)
Patient ID: Sarah [name], female   DOB: 07-07-1950, 67 y.o.   MRN: [id number]                                                                PROGRESS NOTE                                                                                                                                                                                                             Patient Demographics:    Sarah [name], is a 67 y.o. female, DOB - 03-Feb-1951, HBZ:[id number]  Admit date - 10/03/2017   Admitting Physician Merton Border, MD  Outpatient Primary MD for the patient is Wolfgang Phoenix Grace Bushy, MD  LOS - 2  Outpatient Specialists:     Chief Complaint  Patient presents with  . Weakness       Brief Narrative      67 y.o.female,with past medical history significant for A. fib, and left lung cancer diagnosed 1 year ago, presenting with increased generalized weakness and worsening memory problems.MRI of the brain done today, showed mass lesion involving the medial left temporal, parietal and occipital lobes consistent with metastasis with mass-effect and vasogenic edema with midline shift of up to 8 mm. The patient was started on steroids and is going to be transferred to [facility] to be followed by her oncologist Dr. Earlie Server for further management. Patient reports headaches, but no nausea or vomiting. Family in the room and extremely supportive. Patient decided to be DO NOT RESUSCITATE.     Subjective:    Sarah [name] today is doing well. No surgery.  Rad on consult pending.  Still has difficulty with memory. Able to move all 4 ext  No headache, No chest pain, No abdominal pain - No Nausea, No new weakness tingling or numbness, No Cough - SOB.    Assessment  & Plan :    Active Problems:   Brain mass   Brain metastasis (HCC)    Metastatic lung cancer with brain mets and midline shift Start keppra 500mg  po bid (6/6) Decrease decadron to 6mg  iv q6h (6/6) Appreciate neurosurgery  input Oncology consult, appreciate input Rad -onc consult pending, appreciate input  Pafib Cont Cardizem CD 180mg  po qday Cont Flecainide 50mg  po bid Coumadin pharmacy to dose  H/o DVT Cont coumadin pharmacy to dose  Copd Cont symbicort=> dulera Cont albuterol  Gerd Cont PPI  Chronic back pain Cont Norco 7.5/325mg  2 po q4h prn    I spoke with her husband and he can not take care of her at home,  Will involve social work for placement for rehab due to her generalized weakness.  Per Rad-Onc it would be nice for her to get have her appointment on Monday and then be discharged to SNF/ rehab.    Code Status :  DNR  Family Communication  : w patient  Disposition Plan  :  SNF  Barriers For Discharge :   Consults  :  Oncology (spoke with Gudena yesterday to pass on to [facility] that his patient was here)  Procedures  : MRI brain 6/5  CT chest 6/5  DVT Prophylaxis  :  Coumadin  Lab Results  Component Value Date   PLT 194 10/05/2017    Antibiotics  :  none  Anti-infectives (From admission, onward)   None        Objective:   Vitals:   10/03/17 2300 10/04/17 0603 10/04/17 1537 10/05/17 0436  BP: 104/73 (!) 121/54 98/63 (!) 83/53  Pulse: 66 62 73 (!) 54  Resp: 18 15 18 16   Temp: 98.4 F (36.9 C) 99.2 F (37.3 C) 98.3 F (36.8 C) 97.6 F (36.4 C)  TempSrc: Oral Oral Oral Oral  SpO2: 93% 97% 97% 97%  Weight: 104.8 kg (231 lb 0.7 oz)     Height: 5\' 9"  (1.753 m)       Wt Readings from Last 3 Encounters:  10/03/17 104.8 kg (231 lb 0.7 oz)  10/01/17 102.3 kg (225 lb 9.6 oz)  09/26/17 104.7 kg (230 lb 14.4 oz)     Intake/Output Summary (Last 24 hours) at 10/05/2017 0814 Last data filed at 10/05/2017 0700 Gross per 24 hour  Intake 2220 ml  Output 700 ml  Net 1520 ml     Physical Exam  Awake Alert, Oriented X 3, No new F.N deficits, Normal affect [place].AT,PERRAL Supple Neck,No JVD, No cervical lymphadenopathy appriciated.  Symmetrical Chest wall  movement, Good air movement bilaterally, CTAB RRR,No Gallops,Rubs or new Murmurs, No Parasternal Heave +ve B.Sounds, Abd Soft, No tenderness, No organomegaly appriciated, No rebound - guarding or rigidity. No Cyanosis, Clubbing or edema, No new Rash or bruise      Data Review:    CBC Recent Labs  Lab 10/03/17 1530 10/05/17 0600  WBC 6.2 13.0*  HGB 14.1 12.5  HCT 44.0 38.3  PLT 186 194  MCV 94.6 94.3  MCH 30.3 30.8  MCHC 32.0 32.6  RDW 13.9 13.7  LYMPHSABS 0.8  --   MONOABS 0.7  --   EOSABS 0.1  --   BASOSABS 0.0  --     Chemistries  Recent Labs  Lab 10/03/17 1530  NA 142  K 3.6  CL 101  CO2 31  GLUCOSE 89  BUN 7  CREATININE 1.01*  CALCIUM 9.4  AST 21  ALT 20  ALKPHOS 94  BILITOT 0.5   ------------------------------------------------------------------------------------------------------------------ No results for input(s): CHOL, HDL, LDLCALC, TRIG, CHOLHDL, LDLDIRECT in the last 72 hours.  No results found for: HGBA1C ------------------------------------------------------------------------------------------------------------------ No results for input(s): TSH, T4TOTAL, T3FREE, THYROIDAB in the last 72 hours.  Invalid input(s): FREET3 ------------------------------------------------------------------------------------------------------------------ No results for input(s): VITAMINB12, FOLATE, FERRITIN, TIBC, IRON, RETICCTPCT in the last 72 hours.  Coagulation profile Recent Labs  Lab 10/01/17 1440 10/03/17 1530 10/04/17 0825  10/05/17 0600  INR 4.5* 3.15 2.86 3.40    No results for input(s): DDIMER in the last 72 hours.  Cardiac Enzymes No results for input(s): CKMB, TROPONINI, MYOGLOBIN in the last 168 hours.  Invalid input(s): CK ------------------------------------------------------------------------------------------------------------------    Component Value Date/Time   BNP 18.0 03/03/2017 0533    Inpatient Medications  Scheduled  Meds: . buPROPion  150 mg Oral Daily  . calcium carbonate  1 tablet Oral Once per day on Mon Thu  . dexamethasone  6 mg Intravenous Q6H  . diltiazem  180 mg Oral Daily  . flecainide  50 mg Oral BID  . levETIRAcetam  500 mg Oral BID  . mometasone-formoterol  2 puff Inhalation BID  . pantoprazole  40 mg Oral q morning - 10a  . sodium chloride flush  3 mL Intravenous Q12H  . Warfarin - Pharmacist Dosing Inpatient   Does not apply q1800   Continuous Infusions: . sodium chloride 75 mL/hr at 10/04/17 2118  . sodium chloride     PRN Meds:.sodium chloride, acetaminophen **OR** acetaminophen, albuterol, HYDROcodone-acetaminophen, morphine injection, ondansetron **OR** ondansetron (ZOFRAN) IV, sodium chloride flush, zolpidem  Micro Results No results found for this or any previous visit (from the past 240 hour(s)).  Radiology Reports Ct Chest W Contrast  Result Date: 10/03/2017 CLINICAL DATA:  67 year old female with increasing confusion for the past several weeks. Patient has chronic shortness of breath and cough. History of lung cancer. EXAM: CT CHEST WITH CONTRAST TECHNIQUE: Multidetector CT imaging of the chest was performed during intravenous contrast administration. CONTRAST:  25mL OMNIPAQUE IOHEXOL 300 MG/ML  SOLN COMPARISON:  Chest CT 08/13/2017. FINDINGS: Cardiovascular: Heart size is normal. There is no significant pericardial fluid, thickening or pericardial calcification. Dilatation of the pulmonic trunk (3.8 cm in diameter). Aortic atherosclerosis. No definite coronary artery calcifications. Right internal jugular single-lumen PermCath with tip terminating in the mid superior vena cava. Mediastinum/Nodes: No pathologically enlarged mediastinal or hilar lymph nodes. Esophagus is unremarkable in appearance. No axillary lymphadenopathy. Lungs/Pleura: Previously treated left lower lobe mass is no longer clearly evident. There is some amorphous soft tissue in the region of the treated mass which  roughly measures 3.3 x 6.0 cm (axial image 60 of series 2), however, which portion of this tissue is simply collapsed lung versus which portion represents residual or recurrent tumor is unclear on today's examination. Adjacent to this abnormal tissue there is near complete obstruction of the left lower lobe bronchus best appreciated on axial image 68 of series 2. Extensive atelectasis is present in the left lower lobe, in addition to additional areas of ground-glass attenuation, septal thickening, thickening of the peribronchovascular interstitium and regional architectural distortion throughout the left upper lobe which likely reflects chronic postradiation changes. Trace chronic left pleural effusion, similar to the prior study. A small amount of scarring is also noted in the medial segment of the right middle lobe and medial aspect of the right upper lobe. Diffuse bronchial wall thickening with mild to moderate centrilobular emphysema. No new suspicious appearing pulmonary nodules or masses are noted. Upper Abdomen: Status post cholecystectomy. 3.0 x 2.8 cm right adrenal nodule, similar to prior examinations, previously characterized as an adenoma. 2.4 cm simple cyst in the upper pole of the right kidney. Musculoskeletal: There are no aggressive appearing lytic or blastic lesions noted in the visualized portions of the skeleton. IMPRESSION: 1. Post treatment related changes in the left lung, with overall stable appearance of the left lung including poorly defined mass-like area in  the posterior aspect of the left lower lobe, as discussed above. 2. No acute findings are noted. 3. Dilatation of the pulmonic trunk (3.8 cm in diameter), concerning for pulmonary arterial hypertension. 4. Diffuse bronchial wall thickening with mild to moderate centrilobular emphysema. 5. Additional incidental findings, as above. Aortic Atherosclerosis (ICD10-I70.0) and Emphysema (ICD10-J43.9). Electronically Signed   By: Vinnie Langton M.D.   On: 10/03/2017 17:38   Mr Jeri Cos ZL Contrast  Result Date: 10/03/2017 CLINICAL DATA:  Headache and weakness. Neuro deficits, subacute. Lung cancer. EXAM: MRI HEAD WITHOUT AND WITH CONTRAST TECHNIQUE: Multiplanar, multiecho pulse sequences of the brain and surrounding structures were obtained without and with intravenous contrast. CONTRAST:  38mL MULTIHANCE GADOBENATE DIMEGLUMINE 529 MG/ML IV SOLN COMPARISON:  MRI brain 09/12/2016.  Whole-body PET scan 09/13/2016 FINDINGS: Brain: A peripherally enhancing heterogeneous mass lesion is present in the medial left temporal, parietal, and occipital lobe. The lesion measures 6.2 x 4.2 x 4.3 cm. There is involvement of the dura along the interhemispheric falx, and the tentorium. The lesion crosses midline just below the corpus callosum. There is also involvement of the left ventricular ependyma. There is mass effect on the left ventricle. Surrounding T2 signal changes are compatible with vasogenic edema. Midline shift measures up to 8 mm. No distal enhancing lesions are present. Periventricular and subcortical white matter changes are otherwise stable. The right lateral ventricle is stable in size. Left temporal horn is enlarged and may be partially occluded. There is some mass effect on the para mesencephalic cisterns. Brainstem and cerebellum are otherwise within normal limits. Vascular: Flow is present in the major intracranial arteries. Skull and upper cervical spine: The skull base is within normal limits. Visualized cervical spine is normal. Sinuses/Orbits: The paranasal sinuses and mastoid air cells are clear. Globes and orbits are within normal limits. IMPRESSION: 1. Heterogeneous predominantly peripherally enhancing mass lesion involving the medial left temporal, parietal, and occipital lobes measures 6.2 x 4.2 x 4.3 cm. This is most consistent with a solitary metastasis from the patient's known lung cancer. 2. The lesion involves the dura and  invades the left ventricle with enhancement along the ependyma. 3. Mass effect with surrounding vasogenic edema and midline shift of up to 8 mm. 4. Dilation of the left temporal tip suggests obstruction by the mass. 5. Periventricular and subcortical white matter disease is otherwise stable. These results were called by telephone at the time of interpretation on 10/03/2017 at 3:37 pm to Dr. Fredia Sorrow , who verbally acknowledged these results. Electronically Signed   By: San Morelle M.D.   On: 10/03/2017 15:37    Time Spent in minutes  30   Jani Gravel M.D on 10/05/2017 at 8:14 AM  Between 7am to 7pm - Pager - [phone]  After 7pm go to [email or web address] - password [email or web address]  [facility] -  Office  [phone]

## 2017-10-05 NOTE — Progress Notes (Signed)
SRS treatment planning:   MRI 6/7 SIM 6/10 @ 2:00 First treatment fraction 6/14 @ 1:00 Second treatment fraction 6/18 @ 12:15 Third Fraction 6/20 @ 11:45   Mont Dutton R.T.(R)(T) Special Procedures Navigator  [phone]

## 2017-10-05 NOTE — Progress Notes (Signed)
Patient ID: Sarah [name], female   DOB: May 05, 1950, 67 y.o.   MRN: [id number] BP (!) 105/53 (BP Location: Left Arm)   Pulse 61   Temp 98.5 F (36.9 C) (Oral)   Resp 16   Ht 5\' 9"  (1.753 m) Comment: stated  Wt 104.8 kg (231 lb 0.7 oz)   SpO2 96%   BMI 34.12 kg/m  Alert, some word finding difficulties, poor short term memory Moving all extremities well Right field cut Agree with plan to proceed with radiation.  No new recommendations Continue decadron, and keppra.

## 2017-10-05 NOTE — Progress Notes (Signed)
ANTICOAGULATION CONSULT NOTE - Follow-Up   Pharmacy Consult for warfarin Indication: atrial fibrillation  Allergies  Allergen Reactions  . Aspirin Nausea And Vomiting    Patient Measurements: Height: 5\' 9"  (175.3 cm)(stated) Weight: 231 lb 0.7 oz (104.8 kg) IBW/kg (Calculated) : 66.2  Vital Signs: Temp: 97.6 F (36.4 C) (06/07 0436) Temp Source: Oral (06/07 0436) BP: 83/53 (06/07 0436) Pulse Rate: 54 (06/07 0436)  Labs: Recent Labs    10/03/17 1530 10/04/17 0825 10/05/17 0600  HGB 14.1  --  12.5  HCT 44.0  --  38.3  PLT 186  --  194  LABPROT 32.1* 29.8* 34.1*  INR 3.15 2.86 3.40  CREATININE 1.01*  --   --     Estimated Creatinine Clearance: 70.6 mL/min (A) (by C-G formula based on SCr of 1.01 mg/dL (H)).   Medical History: Past Medical History:  Diagnosis Date  . Arthritis   . Cancer ([place])    lung cancer  . Chronic anxiety    Patient describes as stress  . Chronic back pain   . DVT (deep venous thrombosis) ([place]) age 70  . GERD (gastroesophageal reflux disease)   . Goals of care, counseling/discussion 09/21/2016  . Headache(784.0)   . Hypoestrogenism   . Lower extremity venous stasis    LLE, chronic  . Obesity   . Odynophagia 11/13/2016  . Paroxysmal atrial fibrillation (HCC)   . PONV (postoperative nausea and vomiting)   . Pulmonary embolism ([place])   . Reflux   . Sleep apnea    wear CPAP  . Spinal stenosis   . Tobacco abuse   . Venous stasis   . Warfarin anticoagulation      Assessment: Pharmacy consulted to dose warfarin for patient with atrial fibrillation. INR on admission elevated at 3.15 and warfarin dose held on evening of 6/5 due to elevated INR.   INR this morning elevated at 3.4. Slight decrease in hemoglobin and platelets are stable. No noted concerns with bleeding at this time. Of note, patient also started on IV decadron this admission--this could lead to increased or decreased effects of warfarin and will monitor closely.   PTA  warfarin dose: 2.5mg  PO daily, except NO dose on Mondays  Last PTA dose noted to be on 6/4 per med rec.   Goal of Therapy:  INR 2-3 Monitor platelets by anticoagulation protocol: Yes   Plan:  HOLD warfarin tonight for elevated INR Monitor daily CBC/INR Watch for s/sx of bleeding  Jalene Mullet, Pharm.D. PGY1 Pharmacy Resident 10/05/2017 8:57 AM

## 2017-10-05 NOTE — Consult Note (Signed)
Radiation Oncology         [phone] ________________________________  Name: Sarah [name]        MRN: [id number]  Date of Service: 10/03/2017 DOB: Nov 22, 1950  IR:JJOACZ, Grace Bushy, MD  No ref. provider found     REFERRING PHYSICIAN: No ref. provider found   DIAGNOSIS: The primary encounter diagnosis was Generalized weakness. Diagnoses of Memory deficit, Primary malignant neoplasm of lung metastatic to other site, unspecified laterality ([place]), and Brain metastasis ([place]) were also pertinent to this visit.   HISTORY OF PRESENT ILLNESS: Sarah [name] is a 67 y.o. female seen at the request of Dr. Christella Noa.   She has a history of unresectable stage IIIa non-small cell lung cancer, squamous cell carcinoma diagnosed and treated in May 2018.  She completed a course of concurrent chemoradiation with weekly carboplatin and paclitaxel concurrent with daily external beam radiotherapy which was completed on November 30, 2016.  She tolerated this treatment well and this was followed with consolidation immunotherapy with Imfinzi every 2 weeks beginning on 02/21/2017 and ongoing.  She has completed 15 cycles at present.  She tolerates the immunotherapy very well and has remained in close follow-up with her medical oncologist, Dr. Julien Nordmann..   Follow-up systemic imaging as recent as 08/13/2017, prior to her admission, had continued to show disease improvement and stability without evidence of new lesions or metastatic disease.  She presented to the emergency department at [facility] on 10/03/2017 with complaints of progressive weakness resulting in 2 falls at home prior to her presentation to the ED as well as progressive memory loss/confusion, decreased visual acuity and difficulty with word finding ongoing for the past 2 to 3 weeks.   An MRI brain on admission showed a solitary 6 x 4 x 4 cm peripherally enhancing, heterogeneous mass lesion in the medial left temporal, parietal and occipital lobe crossing  the midline just below the corpus callosum with mass-effect on the left ventricle causing a midline shift measuring up to 8 mm.  This mass appears most consistent with a solitary metastasis from the patient's known lung cancer.  She was started on Decadron 6 mg IV every 6 hours and Keppra 500 mg p.o. twice daily and transferred to [name] for possible neurosurgical evaluation.    We have been asked to see the patient to discuss the potential role of radiotherapy in the management of her metastatic disease in the brain.  PREVIOUS RADIATION THERAPY: Yes- Patient completed a course of concurrent chemoradiation with weekly carboplatin and paclitaxel X5 cycles. She received her concurrent external beam radiation in [facility]: Left Lung was treated to 59.4 Gy in 33 fxs of 1.8 Gy/fx between 10/10/2016 - 11/30/2016.   PAST MEDICAL HISTORY:  Past Medical History:  Diagnosis Date  . Arthritis   . Cancer ([place])    lung cancer  . Chronic anxiety    Patient describes as stress  . Chronic back pain   . DVT (deep venous thrombosis) ([place]) age 89  . GERD (gastroesophageal reflux disease)   . Goals of care, counseling/discussion 09/21/2016  . Headache(784.0)   . Hypoestrogenism   . Lower extremity venous stasis    LLE, chronic  . Obesity   . Odynophagia 11/13/2016  . Paroxysmal atrial fibrillation ([facility])   . PONV (postoperative nausea and vomiting)   . Pulmonary embolism ([place])   . Reflux   . Sleep apnea    wear CPAP  . Spinal stenosis   . Tobacco  abuse   . Venous stasis   . Warfarin anticoagulation        PAST SURGICAL HISTORY: Past Surgical History:  Procedure Laterality Date  . ANKLE SURGERY     x2  . APPENDECTOMY    . CARPAL TUNNEL RELEASE    . CHOLECYSTECTOMY    . IR FLUORO GUIDE PORT INSERTION RIGHT  03/19/2017  . IR US GUIDE VASC ACCESS RIGHT  03/19/2017  . OVARIAN CYST REMOVAL    . ROTATOR CUFF REPAIR    . TUBAL LIGATION    . VIDEO BRONCHOSCOPY WITH  ENDOBRONCHIAL ULTRASOUND N/A 09/14/2016   Procedure: VIDEO BRONCHOSCOPY WITH ENDOBRONCHIAL ULTRASOUND;  Surgeon: Melrose Nakayama, MD;  Location: [facility] OR;  Service: Thoracic;  Laterality: N/A;     FAMILY HISTORY:  Family History  Problem Relation Age of Onset  . Arrhythmia Mother   . Cancer Mother        colon  . Heart disease Mother   . Stroke Father        Deceased     SOCIAL HISTORY:  reports that she quit smoking about 13 months ago. Her smoking use included cigarettes. She has a 40.00 pack-year smoking history. She has never used smokeless tobacco. She reports that she does not drink alcohol or use drugs.   ALLERGIES: Aspirin   MEDICATIONS:  Current Facility-Administered Medications  Medication Dose Route Frequency Provider Last Rate Last Dose  . 0.9 %  sodium chloride infusion   Intravenous Continuous Merton Border, MD 75 mL/hr at 10/04/17 2118    . 0.9 %  sodium chloride infusion  250 mL Intravenous PRN Merton Border, MD      . acetaminophen (TYLENOL) tablet 650 mg  650 mg Oral Q6H PRN Merton Border, MD       Or  . acetaminophen (TYLENOL) suppository 650 mg  650 mg Rectal Q6H PRN Merton Border, MD      . albuterol (PROVENTIL) (2.5 MG/3ML) 0.083% nebulizer solution 2.5 mg  2.5 mg Nebulization Q6H PRN Merton Border, MD      . buPROPion (WELLBUTRIN SR) 12 hr tablet 150 mg  150 mg Oral Daily Merton Border, MD   150 mg at 10/04/17 1202  . calcium carbonate (TUMS - dosed in mg elemental calcium) chewable tablet 200 mg of elemental calcium  1 tablet Oral Once per day on Mon Thu Hijazi, Ali, MD   200 mg of elemental calcium at 10/04/17 0859  . dexamethasone (DECADRON) injection 6 mg  6 mg Intravenous Q6H Jani Gravel, MD   6 mg at 10/05/17 0604  . diltiazem (CARDIZEM CD) 24 hr capsule 180 mg  180 mg Oral Daily Merton Border, MD   180 mg at 10/04/17 0850  . flecainide (TAMBOCOR) tablet 50 mg  50 mg Oral BID Merton Border, MD   50 mg at 10/04/17 2122  . HYDROcodone-acetaminophen (NORCO) 7.5-325 MG  per tablet 2 tablet  2 tablet Oral Q4H PRN Merton Border, MD   2 tablet at 10/04/17 1946  . levETIRAcetam (KEPPRA) tablet 500 mg  500 mg Oral BID Jani Gravel, MD   500 mg at 10/04/17 2122  . mometasone-formoterol (DULERA) 200-5 MCG/ACT inhaler 2 puff  2 puff Inhalation BID Merton Border, MD      . morphine 2 MG/ML injection 2 mg  2 mg Intravenous Q4H PRN Merton Border, MD      . ondansetron (ZOFRAN) tablet 4 mg  4 mg Oral Q6H PRN Merton Border, MD  Or  . ondansetron (ZOFRAN) injection 4 mg  4 mg Intravenous Q6H PRN Merton Border, MD      . pantoprazole (PROTONIX) EC tablet 40 mg  40 mg Oral q morning - 10a Merton Border, MD   40 mg at 10/04/17 0852  . sodium chloride flush (NS) 0.9 % injection 3 mL  3 mL Intravenous Q12H Hijazi, Deatra Canter, MD      . sodium chloride flush (NS) 0.9 % injection 3 mL  3 mL Intravenous PRN Merton Border, MD      . Warfarin - Pharmacist Dosing Inpatient   Does not apply q1800 Jalene Mullet, RPH      . zolpidem (AMBIEN) tablet 5 mg  5 mg Oral QHS PRN Merton Border, MD         REVIEW OF SYSTEMS: On review of systems, the patient reports that she is doing better since time of admission.  She continues to feel weak in general but reports improved mental clarity and less difficulty with word finding since starting the steroids.  She has not been having headaches, tremors, seizure activity, or tinnitus.  She does report decreased visual acuity, R>L, ongoing for at least the past 6 months with more blurry vision recently but denies double vision or flashing lights.  She also reports progressive weakness in the lower extremities over the past 2 to 3 weeks but much more pronounced over the past 48 hours resulting in 2 falls at home within a 12 hour period prior to her presentation to the emergency department.  She denies dizziness or imbalance and instead just felt like she simply had no strength in her legs causing her to fall.  She has not had any loss of bowel or bladder control and denies low  back pain or radiculopathy.  She denies any chest pain, shortness of breath, cough, hemoptysis, fevers, chills, night sweats or unintended weight changes.  She denies any bowel or bladder disturbances, and denies abdominal pain, nausea or vomiting.  She denies any new musculoskeletal or joint aches or pains. A complete review of systems is obtained and is otherwise negative.  PHYSICAL EXAM:  Wt Readings from Last 3 Encounters:  10/03/17 231 lb 0.7 oz (104.8 kg)  10/01/17 225 lb 9.6 oz (102.3 kg)  09/26/17 230 lb 14.4 oz (104.7 kg)   Temp Readings from Last 3 Encounters:  10/05/17 97.6 F (36.4 C) (Oral)  09/26/17 98.5 F (36.9 C) (Oral)  09/12/17 98.6 F (37 C) (Oral)   BP Readings from Last 3 Encounters:  10/05/17 (!) 83/53  10/01/17 (!) 136/94  09/26/17 (!) 111/52   Pulse Readings from Last 3 Encounters:  10/05/17 (!) 54  09/26/17 74  09/12/17 77   Pain Assessment Pain Score: 0-No pain/10  In general this is a well appearing Caucasian female in no acute distress.  She is alert and oriented x4 and appropriate throughout the examination. HEENT reveals that the patient is normocephalic, atraumatic. EOMs are intact. PERRLA.  There is visual field loss on the right.  Skin is intact without any evidence of gross lesions. Cardiovascular exam reveals a regular rate and rhythm, no clicks rubs or murmurs are auscultated. Chest is clear to auscultation bilaterally. Lymphatic assessment is performed and does not reveal any adenopathy in the cervical, supraclavicular, axillary, or inguinal chains. Abdomen has active bowel sounds in all quadrants and is intact. The abdomen is soft, non tender, non distended. Lower extremities are negative for pretibial pitting edema, deep calf tenderness, cyanosis or  clubbing.  Sensation is intact to light touch bilaterally in the upper and lower extremities and strength is 5 out of 5 and equal bilaterally in the upper and lower extremities.  ECOG = 2  0 -  Asymptomatic (Fully active, able to carry on all predisease activities without restriction)  1 - Symptomatic but completely ambulatory (Restricted in physically strenuous activity but ambulatory and able to carry out work of a light or sedentary nature. For example, light housework, office work)  2 - Symptomatic, <50% in bed during the day (Ambulatory and capable of all self care but unable to carry out any work activities. Up and about more than 50% of waking hours)  3 - Symptomatic, >50% in bed, but not bedbound (Capable of only limited self-care, confined to bed or chair 50% or more of waking hours)  4 - Bedbound (Completely disabled. Cannot carry on any self-care. Totally confined to bed or chair)  5 - Death   Eustace Pen MM, Creech RH, Tormey DC, et al. [phone]). "Toxicity and response criteria of the [facility] Group". [place] Oncol. 5 (6): 649-55    LABORATORY DATA:  Lab Results  Component Value Date   WBC 13.0 (H) 10/05/2017   HGB 12.5 10/05/2017   HCT 38.3 10/05/2017   MCV 94.3 10/05/2017   PLT 194 10/05/2017   Lab Results  Component Value Date   NA 142 10/03/2017   K 3.6 10/03/2017   CL 101 10/03/2017   CO2 31 10/03/2017   Lab Results  Component Value Date   ALT 20 10/03/2017   AST 21 10/03/2017   ALKPHOS 94 10/03/2017   BILITOT 0.5 10/03/2017      RADIOGRAPHY: Ct Chest W Contrast  Result Date: 10/03/2017 CLINICAL DATA:  67 year old female with increasing confusion for the past several weeks. Patient has chronic shortness of breath and cough. History of lung cancer. EXAM: CT CHEST WITH CONTRAST TECHNIQUE: Multidetector CT imaging of the chest was performed during intravenous contrast administration. CONTRAST:  25m OMNIPAQUE IOHEXOL 300 MG/ML  SOLN COMPARISON:  Chest CT 08/13/2017. FINDINGS: Cardiovascular: Heart size is normal. There is no significant pericardial fluid, thickening or pericardial calcification. Dilatation of the pulmonic trunk (3.8 cm  in diameter). Aortic atherosclerosis. No definite coronary artery calcifications. Right internal jugular single-lumen PermCath with tip terminating in the mid superior vena cava. Mediastinum/Nodes: No pathologically enlarged mediastinal or hilar lymph nodes. Esophagus is unremarkable in appearance. No axillary lymphadenopathy. Lungs/Pleura: Previously treated left lower lobe mass is no longer clearly evident. There is some amorphous soft tissue in the region of the treated mass which roughly measures 3.3 x 6.0 cm (axial image 60 of series 2), however, which portion of this tissue is simply collapsed lung versus which portion represents residual or recurrent tumor is unclear on today's examination. Adjacent to this abnormal tissue there is near complete obstruction of the left lower lobe bronchus best appreciated on axial image 68 of series 2. Extensive atelectasis is present in the left lower lobe, in addition to additional areas of ground-glass attenuation, septal thickening, thickening of the peribronchovascular interstitium and regional architectural distortion throughout the left upper lobe which likely reflects chronic postradiation changes. Trace chronic left pleural effusion, similar to the prior study. A small amount of scarring is also noted in the medial segment of the right middle lobe and medial aspect of the right upper lobe. Diffuse bronchial wall thickening with mild to moderate centrilobular emphysema. No new suspicious appearing pulmonary nodules or masses are  noted. Upper Abdomen: Status post cholecystectomy. 3.0 x 2.8 cm right adrenal nodule, similar to prior examinations, previously characterized as an adenoma. 2.4 cm simple cyst in the upper pole of the right kidney. Musculoskeletal: There are no aggressive appearing lytic or blastic lesions noted in the visualized portions of the skeleton. IMPRESSION: 1. Post treatment related changes in the left lung, with overall stable appearance of the left  lung including poorly defined mass-like area in the posterior aspect of the left lower lobe, as discussed above. 2. No acute findings are noted. 3. Dilatation of the pulmonic trunk (3.8 cm in diameter), concerning for pulmonary arterial hypertension. 4. Diffuse bronchial wall thickening with mild to moderate centrilobular emphysema. 5. Additional incidental findings, as above. Aortic Atherosclerosis (ICD10-I70.0) and Emphysema (ICD10-J43.9). Electronically Signed   By: Vinnie Langton M.D.   On: 10/03/2017 17:38   Mr Jeri Cos WT Contrast  Result Date: 10/03/2017 CLINICAL DATA:  Headache and weakness. Neuro deficits, subacute. Lung cancer. EXAM: MRI HEAD WITHOUT AND WITH CONTRAST TECHNIQUE: Multiplanar, multiecho pulse sequences of the brain and surrounding structures were obtained without and with intravenous contrast. CONTRAST:  40m MULTIHANCE GADOBENATE DIMEGLUMINE 529 MG/ML IV SOLN COMPARISON:  MRI brain 09/12/2016.  Whole-body PET scan 09/13/2016 FINDINGS: Brain: A peripherally enhancing heterogeneous mass lesion is present in the medial left temporal, parietal, and occipital lobe. The lesion measures 6.2 x 4.2 x 4.3 cm. There is involvement of the dura along the interhemispheric falx, and the tentorium. The lesion crosses midline just below the corpus callosum. There is also involvement of the left ventricular ependyma. There is mass effect on the left ventricle. Surrounding T2 signal changes are compatible with vasogenic edema. Midline shift measures up to 8 mm. No distal enhancing lesions are present. Periventricular and subcortical white matter changes are otherwise stable. The right lateral ventricle is stable in size. Left temporal horn is enlarged and may be partially occluded. There is some mass effect on the para mesencephalic cisterns. Brainstem and cerebellum are otherwise within normal limits. Vascular: Flow is present in the major intracranial arteries. Skull and upper cervical spine: The skull  base is within normal limits. Visualized cervical spine is normal. Sinuses/Orbits: The paranasal sinuses and mastoid air cells are clear. Globes and orbits are within normal limits. IMPRESSION: 1. Heterogeneous predominantly peripherally enhancing mass lesion involving the medial left temporal, parietal, and occipital lobes measures 6.2 x 4.2 x 4.3 cm. This is most consistent with a solitary metastasis from the patient's known lung cancer. 2. The lesion involves the dura and invades the left ventricle with enhancement along the ependyma. 3. Mass effect with surrounding vasogenic edema and midline shift of up to 8 mm. 4. Dilation of the left temporal tip suggests obstruction by the mass. 5. Periventricular and subcortical white matter disease is otherwise stable. These results were called by telephone at the time of interpretation on 10/03/2017 at 3:37 pm to Dr. SFredia Sorrow, who verbally acknowledged these results. Electronically Signed   By: CSan MorelleM.D.   On: 10/03/2017 15:37       IMPRESSION/PLAN: [age]year old female with newly diagnosed solitary 6 cm brain metastasis secondary to non-small cell lung cancer, squamous cell carcinoma. Dr. MLisbeth Renshawand I have personally reviewed the patient's imaging and work-up to date.  At this point, it appears that the patient would potentially benefit from radiotherapy. The options include whole brain irradiation versus stereotactic radiosurgery. There are pros and cons associated with each of these potential treatment options. Whole  brain radiotherapy would treat the known metastatic deposits and help provide some reduction of risk for future brain metastases. However, whole brain radiotherapy carries potential risks including hair loss, subacute somnolence, and neurocognitive changes including a possible reduction in short-term memory. Whole brain radiotherapy also may carry a lower likelihood of tumor control at the treatment sites because of the low-dose  used. Stereotactic radiosurgery carries a higher likelihood for local tumor control at the targeted sites with lower associated risk for neurocognitive changes such as memory loss. However, the use of stereotactic radiosurgery in this setting may leave the patient at increased risk for new brain metastases elsewhere in the brain as high as 50-60%. Accordingly, patients who receive stereotactic radiosurgery in this setting should undergo ongoing surveillance imaging with brain MRI more frequently in order to identify and treat new small brain metastases before they become symptomatic. Stereotactic radiosurgery does carry some different risks, including a risk of radionecrosis.  PLAN: Today, I reviewed the findings and workup thus far with the patient.  We discussed the need to proceed with an SRS protocol MRI for further evaluation and treatment planning.  The patient has met with Dr. Christella Noa to discuss the potential for preop SRS followed by surgical excision of the tumor but at this point has decided that she is not interested in pursuing surgery.  We therefore discussed the option for fractionated stereotactic radiosurgery delivered in 3 treatments over the course of approximately 1-2 weeks.  We discussed the risks, benefits and short and [name] term affects associated with radiotherapy in this setting.  We also discussed the logistics and delivery of SRS.  The recommendation would be to continue Decadron at 4 mg p.o. 4 times daily until completion of her radiotherapy treatments at which time we will gradually taper her off the steroids.  The patient and family seem to understand the treatment options and the patient would like to proceed with fractionated stereotactic radiosurgery.  She will be scheduled for CT simulation/treatment planning at 2 PM on Monday, 10/08/2017 in anticipation of beginning her first fraction of treatment on Friday, 10/12/2017 at 1 PM followed by a second treatment on Tuesday, 10/16/2017  at 12:15 PM and a final fraction of treatment on Thursday, 10/18/2017 at 11:45 AM.  I have discussed her treatment plan with Dr. Maudie Mercury, [facility] hospitalist and the current plan is for her to remain inpatient until her CT simulation has been completed on Monday 6/10 and then consider discharge to a local skilled nursing/rehab facility to make her treatments more accessible while she is working to recover her strength and mobility.  I spent 60 minutes minutes face to face with the patient and more than 50% of that time was spent in counseling and/or coordination of care.      Nicholos Johns, PA-C

## 2017-10-05 NOTE — Progress Notes (Signed)
Subjective: The patient is seen and examined today.  Her husband and sister were at the bedside.  She is feeling a little bit better today.  She has a history of a stage IIIA non-small cell lung cancer diagnosed in May 2018 status post course of concurrent chemoradiation with partial response followed by consolidation treatment with immunotherapy with Imfinzi (Durvalumab) status post 16 cycles.  She has been tolerating this treatment well but when I saw her last time she was complaining of some memory issues and inability to remember names or phone numbers.  I order MRI of the brain for evaluation but the patient was admitted sooner with generalized weakness and worsening memory issues.  She had CT scan of the head followed by MRI of the brain on 10/03/2017 and that showed heterogeneous predominantly peripherally enhancing mass lesion involving the medial left temporal, parietal and occipital lobes measuring 6.2 x 4.2 x 4.3 cm most consistent with a solitary metastasis from her lung cancer.  The lesion involves the dura and invades the left ventricle with enhancement along the ependyma.  There was mass-effect with surrounding vasogenic edema and midline shift of up to 8 mm.  She was a started on treatment with Decadron and she is feeling much better today.  She continues to have the baseline shortness of breath but no significant chest pain, cough or hemoptysis.  She has no fever or chills she has no nausea, vomiting, diarrhea or constipation.  Objective: Vital signs in last 24 hours: Temp:  [97.6 F (36.4 C)-98.3 F (36.8 C)] 97.6 F (36.4 C) (06/07 0436) Pulse Rate:  [54-73] 65 (06/07 0951) Resp:  [16-18] 16 (06/07 0436) BP: (83-99)/(53-63) 99/57 (06/07 0951) SpO2:  [97 %-100 %] 100 % (06/07 0951)  Intake/Output from previous day: 06/06 0701 - 06/07 0700 In: 2220 [P.O.:350; I.V.:1870] Out: 700 [Urine:700] Intake/Output this shift: Total I/O In: 480 [P.O.:480] Out: -   General appearance:  alert, cooperative, fatigued and no distress Resp: wheezes bilaterally Cardio: regular rate and rhythm, S1, S2 normal, no murmur, click, rub or gallop GI: soft, non-tender; bowel sounds normal; no masses,  no organomegaly Extremities: extremities normal, atraumatic, no cyanosis or edema  Lab Results:  Recent Labs    10/03/17 1530 10/05/17 0600  WBC 6.2 13.0*  HGB 14.1 12.5  HCT 44.0 38.3  PLT 186 194   BMET Recent Labs    10/03/17 1530  NA 142  K 3.6  CL 101  CO2 31  GLUCOSE 89  BUN 7  CREATININE 1.01*  CALCIUM 9.4    Studies/Results: Ct Chest W Contrast  Result Date: 10/03/2017 CLINICAL DATA:  67 year old female with increasing confusion for the past several weeks. Patient has chronic shortness of breath and cough. History of lung cancer. EXAM: CT CHEST WITH CONTRAST TECHNIQUE: Multidetector CT imaging of the chest was performed during intravenous contrast administration. CONTRAST:  6mL OMNIPAQUE IOHEXOL 300 MG/ML  SOLN COMPARISON:  Chest CT 08/13/2017. FINDINGS: Cardiovascular: Heart size is normal. There is no significant pericardial fluid, thickening or pericardial calcification. Dilatation of the pulmonic trunk (3.8 cm in diameter). Aortic atherosclerosis. No definite coronary artery calcifications. Right internal jugular single-lumen PermCath with tip terminating in the mid superior vena cava. Mediastinum/Nodes: No pathologically enlarged mediastinal or hilar lymph nodes. Esophagus is unremarkable in appearance. No axillary lymphadenopathy. Lungs/Pleura: Previously treated left lower lobe mass is no longer clearly evident. There is some amorphous soft tissue in the region of the treated mass which roughly measures 3.3 x 6.0 cm (  axial image 60 of series 2), however, which portion of this tissue is simply collapsed lung versus which portion represents residual or recurrent tumor is unclear on today's examination. Adjacent to this abnormal tissue there is near complete obstruction  of the left lower lobe bronchus best appreciated on axial image 68 of series 2. Extensive atelectasis is present in the left lower lobe, in addition to additional areas of ground-glass attenuation, septal thickening, thickening of the peribronchovascular interstitium and regional architectural distortion throughout the left upper lobe which likely reflects chronic postradiation changes. Trace chronic left pleural effusion, similar to the prior study. A small amount of scarring is also noted in the medial segment of the right middle lobe and medial aspect of the right upper lobe. Diffuse bronchial wall thickening with mild to moderate centrilobular emphysema. No new suspicious appearing pulmonary nodules or masses are noted. Upper Abdomen: Status post cholecystectomy. 3.0 x 2.8 cm right adrenal nodule, similar to prior examinations, previously characterized as an adenoma. 2.4 cm simple cyst in the upper pole of the right kidney. Musculoskeletal: There are no aggressive appearing lytic or blastic lesions noted in the visualized portions of the skeleton. IMPRESSION: 1. Post treatment related changes in the left lung, with overall stable appearance of the left lung including poorly defined mass-like area in the posterior aspect of the left lower lobe, as discussed above. 2. No acute findings are noted. 3. Dilatation of the pulmonic trunk (3.8 cm in diameter), concerning for pulmonary arterial hypertension. 4. Diffuse bronchial wall thickening with mild to moderate centrilobular emphysema. 5. Additional incidental findings, as above. Aortic Atherosclerosis (ICD10-I70.0) and Emphysema (ICD10-J43.9). Electronically Signed   By: Vinnie Langton M.D.   On: 10/03/2017 17:38   Mr Jeri Cos IH Contrast  Result Date: 10/03/2017 CLINICAL DATA:  Headache and weakness. Neuro deficits, subacute. Lung cancer. EXAM: MRI HEAD WITHOUT AND WITH CONTRAST TECHNIQUE: Multiplanar, multiecho pulse sequences of the brain and surrounding  structures were obtained without and with intravenous contrast. CONTRAST:  23mL MULTIHANCE GADOBENATE DIMEGLUMINE 529 MG/ML IV SOLN COMPARISON:  MRI brain 09/12/2016.  Whole-body PET scan 09/13/2016 FINDINGS: Brain: A peripherally enhancing heterogeneous mass lesion is present in the medial left temporal, parietal, and occipital lobe. The lesion measures 6.2 x 4.2 x 4.3 cm. There is involvement of the dura along the interhemispheric falx, and the tentorium. The lesion crosses midline just below the corpus callosum. There is also involvement of the left ventricular ependyma. There is mass effect on the left ventricle. Surrounding T2 signal changes are compatible with vasogenic edema. Midline shift measures up to 8 mm. No distal enhancing lesions are present. Periventricular and subcortical white matter changes are otherwise stable. The right lateral ventricle is stable in size. Left temporal horn is enlarged and may be partially occluded. There is some mass effect on the para mesencephalic cisterns. Brainstem and cerebellum are otherwise within normal limits. Vascular: Flow is present in the major intracranial arteries. Skull and upper cervical spine: The skull base is within normal limits. Visualized cervical spine is normal. Sinuses/Orbits: The paranasal sinuses and mastoid air cells are clear. Globes and orbits are within normal limits. IMPRESSION: 1. Heterogeneous predominantly peripherally enhancing mass lesion involving the medial left temporal, parietal, and occipital lobes measures 6.2 x 4.2 x 4.3 cm. This is most consistent with a solitary metastasis from the patient's known lung cancer. 2. The lesion involves the dura and invades the left ventricle with enhancement along the ependyma. 3. Mass effect with surrounding vasogenic edema and  midline shift of up to 8 mm. 4. Dilation of the left temporal tip suggests obstruction by the mass. 5. Periventricular and subcortical white matter disease is otherwise  stable. These results were called by telephone at the time of interpretation on 10/03/2017 at 3:37 pm to Dr. Fredia Sorrow , who verbally acknowledged these results. Electronically Signed   By: San Morelle M.D.   On: 10/03/2017 15:37    Medications: I have reviewed the patient's current medications.  CODE STATUS: 1  Assessment/Plan: This is a very pleasant 67 years old white female with metastatic non-small cell lung cancer that was initially diagnosed as a stage IIIa status post a course of concurrent chemoradiation followed by consolidation immunotherapy with Imfinzi (Durvalumab) status post 16 cycles. Unfortunately the patient presented with large solitary brain metastasis. I had a lengthy discussion with the patient and her family about her condition and treatment options.  The patient was seen by neurosurgery Dr. Christella Noa but she is reluctant about consideration of surgery. I recommended for her to continue her current treatment with Decadron and she was seen by radiation oncology for consideration of palliative radiotherapy to the large solitary brain metastasis. I also discussed with the patient her systemic treatment options and recommended for her to discontinue her current treatment with Imfinzi (Durvalumab) at this point. I will continue to monitor her closely with imaging studies and if she has evidence for disease progression in the future, we will consider other treatment options versus palliative care. The patient and her husband were in agreement with the current plan. Thank you so much for taken good care of Ms. Bruington, I would continue to follow-up the patient with you on assist in her management on as-needed basis.   LOS: 2 days    Eilleen Kempf 10/05/2017

## 2017-10-06 LAB — PROTIME-INR
INR: 3.31
PROTHROMBIN TIME: 33.4 s — AB (ref 11.4–15.2)

## 2017-10-06 LAB — CBC
HEMATOCRIT: 38.5 % (ref 36.0–46.0)
Hemoglobin: 12.5 g/dL (ref 12.0–15.0)
MCH: 30.8 pg (ref 26.0–34.0)
MCHC: 32.5 g/dL (ref 30.0–36.0)
MCV: 94.8 fL (ref 78.0–100.0)
Platelets: 203 10*3/uL (ref 150–400)
RBC: 4.06 MIL/uL (ref 3.87–5.11)
RDW: 13.7 % (ref 11.5–15.5)
WBC: 14.2 10*3/uL — ABNORMAL HIGH (ref 4.0–10.5)

## 2017-10-06 NOTE — Progress Notes (Signed)
ANTICOAGULATION CONSULT NOTE - Follow-Up   Pharmacy Consult for warfarin Indication: atrial fibrillation  Allergies  Allergen Reactions  . Aspirin Nausea And Vomiting    Patient Measurements: Height: 5\' 9"  (175.3 cm)(stated) Weight: 231 lb 0.7 oz (104.8 kg) IBW/kg (Calculated) : 66.2  Vital Signs: Temp: 97.6 F (36.4 C) (06/08 0517) Temp Source: Oral (06/08 0517) BP: 110/58 (06/08 0517) Pulse Rate: 55 (06/08 0517)  Labs: Recent Labs    10/03/17 1530 10/04/17 0825 10/05/17 0600 10/06/17 0518  HGB 14.1  --  12.5 12.5  HCT 44.0  --  38.3 38.5  PLT 186  --  194 203  LABPROT 32.1* 29.8* 34.1* 33.4*  INR 3.15 2.86 3.40 3.31  CREATININE 1.01*  --   --   --     Estimated Creatinine Clearance: 70.6 mL/min (A) (by C-G formula based on SCr of 1.01 mg/dL (H)).   Medical History: Past Medical History:  Diagnosis Date  . Arthritis   . Cancer ([place])    lung cancer  . Chronic anxiety    Patient describes as stress  . Chronic back pain   . DVT (deep venous thrombosis) ([place]) age 31  . GERD (gastroesophageal reflux disease)   . Goals of care, counseling/discussion 09/21/2016  . Headache(784.0)   . Hypoestrogenism   . Lower extremity venous stasis    LLE, chronic  . Obesity   . Odynophagia 11/13/2016  . Paroxysmal atrial fibrillation ([facility])   . PONV (postoperative nausea and vomiting)   . Pulmonary embolism ([place])   . Reflux   . Sleep apnea    wear CPAP  . Spinal stenosis   . Tobacco abuse   . Venous stasis   . Warfarin anticoagulation      Assessment: Pharmacy consulted to dose warfarin for patient with atrial fibrillation. INR on admission elevated at 3.15 and warfarin dose held on evening of 6/5 due to elevated INR.   PTA warfarin dose: 2.5mg  PO daily, except NO dose on Mondays  Last PTA dose noted to be on 6/4 per med rec.   Today, 10/06/17  INR continues to be supratherapeutic despite no dose given yesterday 6/7  CBC stable  No reported bleeding  Goal  of Therapy:  INR 2-3 Monitor platelets by anticoagulation protocol: Yes   Plan:  HOLD warfarin tonight for elevated INR Monitor daily CBC/INR Watch for s/sx of bleeding   Adrian Saran, PharmD, BCPS Pager [phone] 10/06/2017 7:39 AM

## 2017-10-06 NOTE — Progress Notes (Signed)
PROGRESS NOTE    Sarah [name]  ZHG:[id number] DOB: Feb 16, 1951 DOA: 10/03/2017 PCP: [name], MD    Brief Narrative:   67 year old with past medical history relevant for COPD, chronic atrial fibrillation on warfarin, stage IIIa non-small cell lung cancer status post chemoradiation on immunotherapy of durvalmab [place] 16 cycles who was admitted for weakness and memory problems and was found to have large solitary metastatic disease from lung cancer with vasogenic edema and midline shift.   Assessment & Plan:   Active Problems:   Brain mass   Brain metastasis (HCC)   #) Non-small cell lung cancer complicated by metastatic disease to the brain: Patient continues to be quite unsteady on her feet. - Patient seen by neurosurgery, no plan with surgery at this time - Pending palliative radiotherapy for large solitary brain metastatic disease - Oncology following appreciate recommendations -Continue levetiracetam 500 mg twice daily -Continue dexamethasone 6 mg IV every 6 hours -PT evaluation for placement  #) Chronic atrial fibrillation: Unfortunately the patient is on warfarin complicating any possible surgical treatment as well as problems with bleeding intracranially from her solitary metastatic disease. - Continue warfarin, pharmacy consult -Continue diltiazem 180 mg daily - Continue flecainide 50 mg twice daily  #) COPD: Patient is chronically on 2 L -Continue home LABA/ICS -Continue PRN bronchodilators  #) Pain/psych: -Continue bupropion 150 mg daily -Continue PRN morphine  Fluids: Tolerating p.o. Electrolytes: Monitor and supplement Nutrition: Regular diet  Prophylaxis: On warfarin  Disposition: Pending PT evaluation  DO NOT RESUSCITATE  Consultants:   Neurosurgery  Oncology  Radiation oncology  Procedures: (Don't include imaging studies which can be auto populated. Include things that cannot be auto populated i.e. Echo, Carotid and venous  dopplers, Foley, Bipap, HD, tubes/drains, wound vac, central lines etc)  None  Antimicrobials: (specify start and planned stop date. Auto populated tables are space occupying and do not give end dates)  None   Subjective: Patient reports she is doing well however she has significant complaints about her short-term memory.  She also reports that she is had quite a few falls at home and her  legs currently does not work.  She denies any chest pain, shortness of breath, nausea, vomiting, diarrhea, headache.  Objective: Vitals:   10/05/17 1348 10/05/17 1821 10/05/17 2056 10/06/17 0517  BP: 120/62 (!) 105/53 106/61 (!) 110/58  Pulse:  61 64 (!) 55  Resp:  16 16 20   Temp:  98.5 F (36.9 C) 98 F (36.7 C) 97.6 F (36.4 C)  TempSrc:  Oral Oral Oral  SpO2:  96% 97% 98%  Weight:      Height:        Intake/Output Summary (Last 24 hours) at 10/06/2017 1205 Last data filed at 10/06/2017 1006 Gross per 24 hour  Intake 720 ml  Output 1200 ml  Net -480 ml   Filed Weights   10/03/17 1320 10/03/17 2300  Weight: 104.8 kg (231 lb) 104.8 kg (231 lb 0.7 oz)    Examination:  General exam: Appears calm and comfortable  Respiratory system: Clear to auscultation. Respiratory effort normal. Cardiovascular system: Regular rate and rhythm, no murmurs Gastrointestinal system: Abdomen is nondistended, soft and nontender. No organomegaly or masses felt. Normal bowel sounds heard. Central nervous system: Alert and oriented. No focal neurological deficits.  Significantly difficult executive memory, bilateral strength is 5 out of 5 however significant dysdiadochokinesia noted Extremities: No lower extremity edema Skin: Port site is clean dry and intact Psychiatry: Judgement and insight appear  normal. Mood & affect appropriate.     Data Reviewed: I have personally reviewed following labs and imaging studies  CBC: Recent Labs  Lab 10/03/17 1530 10/05/17 0600 10/06/17 0518  WBC 6.2 13.0* 14.2*    NEUTROABS 4.6  --   --   HGB 14.1 12.5 12.5  HCT 44.0 38.3 38.5  MCV 94.6 94.3 94.8  PLT 186 194 481   Basic Metabolic Panel: Recent Labs  Lab 10/03/17 1530  NA 142  K 3.6  CL 101  CO2 31  GLUCOSE 89  BUN 7  CREATININE 1.01*  CALCIUM 9.4   GFR: Estimated Creatinine Clearance: 70.6 mL/min (A) (by C-G formula based on SCr of 1.01 mg/dL (H)). Liver Function Tests: Recent Labs  Lab 10/03/17 1530  AST 21  ALT 20  ALKPHOS 94  BILITOT 0.5  PROT 7.2  ALBUMIN 3.9   Recent Labs  Lab 10/03/17 1530  LIPASE 31   No results for input(s): AMMONIA in the last 168 hours. Coagulation Profile: Recent Labs  Lab 10/01/17 1440 10/03/17 1530 10/04/17 0825 10/05/17 0600 10/06/17 0518  INR 4.5* 3.15 2.86 3.40 3.31   Cardiac Enzymes: No results for input(s): CKTOTAL, CKMB, CKMBINDEX, TROPONINI in the last 168 hours. BNP (last 3 results) No results for input(s): PROBNP in the last 8760 hours. HbA1C: No results for input(s): HGBA1C in the last 72 hours. CBG: No results for input(s): GLUCAP in the last 168 hours. Lipid Profile: No results for input(s): CHOL, HDL, LDLCALC, TRIG, CHOLHDL, LDLDIRECT in the last 72 hours. Thyroid Function Tests: No results for input(s): TSH, T4TOTAL, FREET4, T3FREE, THYROIDAB in the last 72 hours. Anemia Panel: No results for input(s): VITAMINB12, FOLATE, FERRITIN, TIBC, IRON, RETICCTPCT in the last 72 hours. Sepsis Labs: No results for input(s): PROCALCITON, LATICACIDVEN in the last 168 hours.  No results found for this or any previous visit (from the past 240 hour(s)).       Radiology Studies: Mr Jeri Cos EH Contrast  Result Date: 10/05/2017 CLINICAL DATA:  Headache with weakness. Subacute neuro deficits. Lung cancer. Preop SRS. EXAM: MRI HEAD WITHOUT AND WITH CONTRAST TECHNIQUE: Multiplanar, multiecho pulse sequences of the brain and surrounding structures were obtained without and with intravenous contrast. CONTRAST:  42mL MULTIHANCE  GADOBENATE DIMEGLUMINE 529 MG/ML IV SOLN COMPARISON:  MR head 10/03/2017.  MR head 09/12/2016. FINDINGS: Brain: Redemonstrated is a solitary brain lesion, intra-axial but presenting on the superficial aspect of the LEFT posterior temporal and occipital lobes, centrally necrotic, peripherally restricted, most consistent with metastatic lung cancer. No significant change in size from that reported previously, 43 x 62 x 42 mm (R-L x A-P x C-C). No extension via the corpus callosum into the contralateral hemisphere, but the lesion does cross to the contralateral side below the corpus callosum. LEFT-to-RIGHT shift due to vasogenic edema and trapped temporal horn measuring 7 mm. Early LEFT uncal herniation. Regional enhancement of the dura along the tentorium as well as the interhemispheric fissure (series 12, image 16); the ependymal enhancement raises concern for potential CSF spread of tumor. No other definite metastatic deposits are seen on today's 3T scan, although post infusion imaging is motion degraded. Vascular: Normal flow voids. Skull and upper cervical spine: Normal marrow signal. No upper cervical spine abnormalities. Sinuses/Orbits: Negative. Other: None IMPRESSION: Stable, solitary LEFT temporo-occipital brain lesion, likely necrotic metastasis. 43 x 62 x 42 mm, significant vasogenic edema, with trapped LEFT temporal horn contributing to LEFT-to-RIGHT shift of 7 mm. Electronically Signed   By: Jenny Reichmann  Alfonse Flavors M.D.   On: 10/05/2017 17:32        Scheduled Meds: . buPROPion  150 mg Oral Daily  . calcium carbonate  1 tablet Oral Once per day on Mon Thu  . dexamethasone  6 mg Intravenous Q6H  . diltiazem  180 mg Oral Daily  . feeding supplement (ENSURE ENLIVE)  237 mL Oral BID BM  . flecainide  50 mg Oral BID  . levETIRAcetam  500 mg Oral BID  . mometasone-formoterol  2 puff Inhalation BID  . pantoprazole  40 mg Oral q morning - 10a  . sodium chloride flush  3 mL Intravenous Q12H  . Warfarin -  Pharmacist Dosing Inpatient   Does not apply q1800   Continuous Infusions: . sodium chloride 75 mL/hr at 10/06/17 0443  . sodium chloride       LOS: 3 days    Time spent: [place], MD [facility]   If 7PM-7AM, please contact night-coverage [email or web address] Password [facility] 10/06/2017, 12:05 PM

## 2017-10-07 LAB — CBC
HCT: 39.3 % (ref 36.0–46.0)
Hemoglobin: 13.1 g/dL (ref 12.0–15.0)
MCH: 31.6 pg (ref 26.0–34.0)
MCHC: 33.3 g/dL (ref 30.0–36.0)
MCV: 94.7 fL (ref 78.0–100.0)
Platelets: 191 K/uL (ref 150–400)
RBC: 4.15 MIL/uL (ref 3.87–5.11)
RDW: 13.4 % (ref 11.5–15.5)
WBC: 12.1 K/uL — ABNORMAL HIGH (ref 4.0–10.5)

## 2017-10-07 LAB — PROTIME-INR
INR: 2.5
Prothrombin Time: 26.8 s — ABNORMAL HIGH (ref 11.4–15.2)

## 2017-10-07 MED ORDER — WARFARIN SODIUM 2.5 MG PO TABS
2.5000 mg | ORAL_TABLET | Freq: Once | ORAL | Status: AC
  Administered 2017-10-07: 2.5 mg via ORAL
  Filled 2017-10-07: qty 1

## 2017-10-07 NOTE — Evaluation (Signed)
Physical Therapy Evaluation Patient Details Name: Sarah [name] MRN: [id number] DOB: 1950/12/27 Today's Date: 10/07/2017   History of Present Illness  67 year old with past medical history relevant for COPD, chronic atrial fibrillation on warfarin, stage IIIa non-small cell lung cancer status post chemoradiation on immunotherapy of durvalmab [place] 16 cycles who was admitted for weakness and memory problems and was found to have large solitary metastatic disease from lung cancer with vasogenic edema and midline shift.  Clinical Impression  Pt admitted with above diagnosis. Pt currently with functional limitations due to the deficits listed below (see PT Problem List).  Pt will benefit from skilled PT to increase their independence and safety with mobility to allow discharge to the venue listed below.  Pt able to stand and do SPT with RW into recliner, but very fatigued with this.  She had some dizziness in standing.  Recommend SNF at this time.     Follow Up Recommendations SNF;Supervision/Assistance - 24 hour    Equipment Recommendations  None recommended by PT    Recommendations for Other Services       Precautions / Restrictions Precautions Precautions: Fall Restrictions Weight Bearing Restrictions: No      Mobility  Bed Mobility Overal bed mobility: Needs Assistance Bed Mobility: Supine to Sit     Supine to sit: Min guard;HOB elevated     General bed mobility comments: min/guard for safety.  use of rail with HOB elevated  Transfers Overall transfer level: Needs assistance Equipment used: Rolling walker (2 wheeled) Transfers: Sit to/from [facility] Sit to Stand: Min assist;+2 physical assistance;+2 safety/equipment Stand pivot transfers: Min assist;+2 safety/equipment       General transfer comment: Cues for hand placement, but pt unable to complete.  Pt c/o some dizziness in standing, but did well with SPT bed > recliner with RW. She was very  fatigued though once she sat down.  Ambulation/Gait             General Gait Details: deferred secondary to fatigue  Stairs            Wheelchair Mobility    Modified Rankin (Stroke Patients Only)       Balance Overall balance assessment: History of Falls;Needs assistance   Sitting balance-Leahy Scale: Good       Standing balance-Leahy Scale: Poor Standing balance comment: requires UE support                             Pertinent Vitals/Pain Pain Assessment: No/denies pain    Home Living Family/patient expects to be discharged to:: Private residence Living Arrangements: Spouse/significant other   Type of Home: House Home Access: Stairs to enter Entrance Stairs-Rails: Can reach both;Right;Left Entrance Stairs-Number of Steps: 4 Home Layout: One level Home Equipment: Clinical cytogeneticist - 2 wheels;Cane - single point      Prior Function Level of Independence: Independent         Comments: Ambulated [place] until ~ 1 week ago when she had 2 falls     Hand Dominance        Extremity/Trunk Assessment   Upper Extremity Assessment Upper Extremity Assessment: Generalized weakness    Lower Extremity Assessment Lower Extremity Assessment: LLE deficits/detail;Generalized weakness LLE Deficits / Details: L ankle fusion       Communication      Cognition Arousal/Alertness: Awake/alert Behavior During Therapy: WFL for tasks assessed/performed Overall Cognitive Status: Impaired/Different from baseline Area of Impairment: Memory;Following commands;Safety/judgement;Problem  solving                     Memory: Decreased short-term memory Following Commands: Follows one step commands inconsistently Safety/Judgement: Decreased awareness of safety;Decreased awareness of deficits   Problem Solving: Slow processing;Requires verbal cues        General Comments      Exercises     Assessment/Plan    PT Assessment Patient needs  continued PT services  PT Problem List Decreased strength;Decreased range of motion;Decreased mobility;Decreased activity tolerance       PT Treatment Interventions DME instruction;Gait training;Functional mobility training;Therapeutic activities;Therapeutic exercise;Balance training;Neuromuscular re-education    PT Goals (Current goals can be found in the Care Plan section)  Acute Rehab PT Goals Patient Stated Goal: rehab PT Goal Formulation: With family Time For Goal Achievement: 10/21/17    Frequency Min 2X/week   Barriers to discharge        Co-evaluation               AM-PAC PT "6 Clicks" Daily Activity  Outcome Measure Difficulty turning over in bed (including adjusting bedclothes, sheets and blankets)?: A Little Difficulty moving from lying on back to sitting on the side of the bed? : A Little Difficulty sitting down on and standing up from a chair with arms (e.g., wheelchair, bedside commode, etc,.)?: A Lot Help needed moving to and from a bed to chair (including a wheelchair)?: A Little Help needed walking in hospital room?: A Lot Help needed climbing 3-5 steps with a railing? : Total 6 Click Score: 14    End of Session Equipment Utilized During Treatment: Gait belt;Oxygen Activity Tolerance: Patient limited by fatigue Patient left: in chair;with chair alarm set;with call bell/phone within reach;with family/visitor present Nurse Communication: Mobility status PT Visit Diagnosis: Unsteadiness on feet (R26.81);History of falling (Z91.81);Difficulty in walking, not elsewhere classified (R26.2)    Time: 1340-1405 PT Time Calculation (min) (ACUTE ONLY): 25 min   Charges:   PT Evaluation $PT Eval Moderate Complexity: 1 Mod PT Treatments $Therapeutic Activity: 8-22 mins   PT G Codes:        Maame Dack L. Tamala Julian, [place] Pager [phone] 10/07/2017   Galen Manila 10/07/2017, 2:46 PM

## 2017-10-07 NOTE — Progress Notes (Signed)
PROGRESS NOTE    Sarah [name]  VCB:[id number] DOB: May 24, 1950 DOA: 10/03/2017 PCP: [name], MD    Brief Narrative:   67 year old with past medical history relevant for COPD, chronic atrial fibrillation on warfarin, stage IIIa non-small cell lung cancer status post chemoradiation on immunotherapy of durvalmab [place] 16 cycles who was admitted for weakness and memory problems and was found to have large solitary metastatic disease from lung cancer with vasogenic edema and midline shift.   Assessment & Plan:   Active Problems:   Brain mass   Brain metastasis (HCC)   #) Non-small cell lung cancer complicated by metastatic disease to the brain: Patient continues to be quite unsteady on her feet. - Patient seen by neurosurgery, no plan with surgery at this time - Pending palliative radiotherapy for large solitary brain metastatic disease - Oncology following appreciate recommendations -Continue levetiracetam 500 mg twice daily -Continue dexamethasone 6 mg IV every 6 hours -PT evaluation for placement  #) Chronic atrial fibrillation: Unfortunately the patient is on warfarin complicating any possible surgical treatment as well as problems with bleeding intracranially from her solitary metastatic disease. - Continue warfarin, pharmacy consult -Continue diltiazem 180 mg daily - Continue flecainide 50 mg twice daily  #) COPD: Patient is chronically on 2 L -Continue home LABA/ICS -Continue PRN bronchodilators  #) Pain/psych: -Continue bupropion 150 mg daily -Continue PRN morphine  Fluids: Tolerating p.o. Electrolytes: Monitor and supplement Nutrition: Regular diet  Prophylaxis: On warfarin  Disposition: Pending PT evaluation  DO NOT RESUSCITATE  Consultants:   Neurosurgery  Oncology  Radiation oncology  Procedures: (Don't include imaging studies which can be auto populated. Include things that cannot be auto populated i.e. Echo, Carotid and venous  dopplers, Foley, Bipap, HD, tubes/drains, wound vac, central lines etc)  None  Antimicrobials: (specify start and planned stop date. Auto populated tables are space occupying and do not give end dates)  None   Subjective: Patient reports that her symptoms are relatively unchanged from prior.  She has not had any new weakness or focal deficits however she reports that she simply cannot walk.  She denies any chest pain, nausea, vomiting, headache, diarrhea.  Objective: Vitals:   10/06/17 0517 10/06/17 1337 10/06/17 2159 10/07/17 0617  BP: (!) 110/58 (!) 100/54 (!) 101/58 121/63  Pulse: (!) 55 61 (!) 56 (!) 53  Resp: 20 16 14 12   Temp: 97.6 F (36.4 C) 97.9 F (36.6 C) 98.3 F (36.8 C) 98.3 F (36.8 C)  TempSrc: Oral Oral Oral Oral  SpO2: 98% 96% 98% 97%  Weight:      Height:        Intake/Output Summary (Last 24 hours) at 10/07/2017 1105 Last data filed at 10/06/2017 1808 Gross per 24 hour  Intake -  Output 700 ml  Net -700 ml   Filed Weights   10/03/17 1320 10/03/17 2300  Weight: 104.8 kg (231 lb) 104.8 kg (231 lb 0.7 oz)    Examination:  General exam: Appears calm and comfortable  Respiratory system: Clear to auscultation. Respiratory effort normal. Cardiovascular system: Regular rate and rhythm, no murmurs Gastrointestinal system: Abdomen is nondistended, soft and nontender. No organomegaly or masses felt. Normal bowel sounds heard. Central nervous system: Alert and oriented. No focal neurological deficits.  Significantly difficult executive memory, bilateral strength is 5 out of 5 however significant dysdiadochokinesia noted Extremities: No lower extremity edema Skin: Port site is clean dry and intact Psychiatry: Judgement and insight appear normal. Mood & affect appropriate.  Data Reviewed: I have personally reviewed following labs and imaging studies  CBC: Recent Labs  Lab 10/03/17 1530 10/05/17 0600 10/06/17 0518 10/07/17 0549  WBC 6.2 13.0* 14.2*  12.1*  NEUTROABS 4.6  --   --   --   HGB 14.1 12.5 12.5 13.1  HCT 44.0 38.3 38.5 39.3  MCV 94.6 94.3 94.8 94.7  PLT 186 194 203 195   Basic Metabolic Panel: Recent Labs  Lab 10/03/17 1530  NA 142  K 3.6  CL 101  CO2 31  GLUCOSE 89  BUN 7  CREATININE 1.01*  CALCIUM 9.4   GFR: Estimated Creatinine Clearance: 70.6 mL/min (A) (by C-G formula based on SCr of 1.01 mg/dL (H)). Liver Function Tests: Recent Labs  Lab 10/03/17 1530  AST 21  ALT 20  ALKPHOS 94  BILITOT 0.5  PROT 7.2  ALBUMIN 3.9   Recent Labs  Lab 10/03/17 1530  LIPASE 31   No results for input(s): AMMONIA in the last 168 hours. Coagulation Profile: Recent Labs  Lab 10/03/17 1530 10/04/17 0825 10/05/17 0600 10/06/17 0518 10/07/17 0549  INR 3.15 2.86 3.40 3.31 2.50   Cardiac Enzymes: No results for input(s): CKTOTAL, CKMB, CKMBINDEX, TROPONINI in the last 168 hours. BNP (last 3 results) No results for input(s): PROBNP in the last 8760 hours. HbA1C: No results for input(s): HGBA1C in the last 72 hours. CBG: No results for input(s): GLUCAP in the last 168 hours. Lipid Profile: No results for input(s): CHOL, HDL, LDLCALC, TRIG, CHOLHDL, LDLDIRECT in the last 72 hours. Thyroid Function Tests: No results for input(s): TSH, T4TOTAL, FREET4, T3FREE, THYROIDAB in the last 72 hours. Anemia Panel: No results for input(s): VITAMINB12, FOLATE, FERRITIN, TIBC, IRON, RETICCTPCT in the last 72 hours. Sepsis Labs: No results for input(s): PROCALCITON, LATICACIDVEN in the last 168 hours.  No results found for this or any previous visit (from the past 240 hour(s)).       Radiology Studies: Mr Jeri Cos KD Contrast  Result Date: 10/05/2017 CLINICAL DATA:  Headache with weakness. Subacute neuro deficits. Lung cancer. Preop SRS. EXAM: MRI HEAD WITHOUT AND WITH CONTRAST TECHNIQUE: Multiplanar, multiecho pulse sequences of the brain and surrounding structures were obtained without and with intravenous contrast.  CONTRAST:  83mL MULTIHANCE GADOBENATE DIMEGLUMINE 529 MG/ML IV SOLN COMPARISON:  MR head 10/03/2017.  MR head 09/12/2016. FINDINGS: Brain: Redemonstrated is a solitary brain lesion, intra-axial but presenting on the superficial aspect of the LEFT posterior temporal and occipital lobes, centrally necrotic, peripherally restricted, most consistent with metastatic lung cancer. No significant change in size from that reported previously, 43 x 62 x 42 mm (R-L x A-P x C-C). No extension via the corpus callosum into the contralateral hemisphere, but the lesion does cross to the contralateral side below the corpus callosum. LEFT-to-RIGHT shift due to vasogenic edema and trapped temporal horn measuring 7 mm. Early LEFT uncal herniation. Regional enhancement of the dura along the tentorium as well as the interhemispheric fissure (series 12, image 16); the ependymal enhancement raises concern for potential CSF spread of tumor. No other definite metastatic deposits are seen on today's 3T scan, although post infusion imaging is motion degraded. Vascular: Normal flow voids. Skull and upper cervical spine: Normal marrow signal. No upper cervical spine abnormalities. Sinuses/Orbits: Negative. Other: None IMPRESSION: Stable, solitary LEFT temporo-occipital brain lesion, likely necrotic metastasis. 43 x 62 x 42 mm, significant vasogenic edema, with trapped LEFT temporal horn contributing to LEFT-to-RIGHT shift of 7 mm. Electronically Signed   By: Jenny Reichmann  Alfonse Flavors M.D.   On: 10/05/2017 17:32        Scheduled Meds: . buPROPion  150 mg Oral Daily  . calcium carbonate  1 tablet Oral Once per day on Mon Thu  . dexamethasone  6 mg Intravenous Q6H  . diltiazem  180 mg Oral Daily  . feeding supplement (ENSURE ENLIVE)  237 mL Oral BID BM  . flecainide  50 mg Oral BID  . levETIRAcetam  500 mg Oral BID  . mometasone-formoterol  2 puff Inhalation BID  . pantoprazole  40 mg Oral q morning - 10a  . sodium chloride flush  3 mL  Intravenous Q12H  . warfarin  2.5 mg Oral ONCE-1800  . Warfarin - Pharmacist Dosing Inpatient   Does not apply q1800   Continuous Infusions: . sodium chloride       LOS: 4 days    Time spent: [place], MD [facility]   If 7PM-7AM, please contact night-coverage [email or web address] Password [email or web address] 10/07/2017, 11:05 AM

## 2017-10-07 NOTE — Progress Notes (Signed)
ANTICOAGULATION CONSULT NOTE - Follow-Up   Pharmacy Consult for warfarin Indication: atrial fibrillation  Allergies  Allergen Reactions  . Aspirin Nausea And Vomiting    Patient Measurements: Height: 5\' 9"  (175.3 cm)(stated) Weight: 231 lb 0.7 oz (104.8 kg) IBW/kg (Calculated) : 66.2  Vital Signs: Temp: 98.3 F (36.8 C) (06/09 0617) Temp Source: Oral (06/09 0617) BP: 121/63 (06/09 0617) Pulse Rate: 53 (06/09 0617)  Labs: Recent Labs    10/05/17 0600 10/06/17 0518 10/07/17 0549  HGB 12.5 12.5 13.1  HCT 38.3 38.5 39.3  PLT 194 203 191  LABPROT 34.1* 33.4* 26.8*  INR 3.40 3.31 2.50    Estimated Creatinine Clearance: 70.6 mL/min (A) (by C-G formula based on SCr of 1.01 mg/dL (H)).   Medical History: Past Medical History:  Diagnosis Date  . Arthritis   . Cancer ([place])    lung cancer  . Chronic anxiety    Patient describes as stress  . Chronic back pain   . DVT (deep venous thrombosis) ([place]) age 4  . GERD (gastroesophageal reflux disease)   . Goals of care, counseling/discussion 09/21/2016  . Headache(784.0)   . Hypoestrogenism   . Lower extremity venous stasis    LLE, chronic  . Obesity   . Odynophagia 11/13/2016  . Paroxysmal atrial fibrillation (HCC)   . PONV (postoperative nausea and vomiting)   . Pulmonary embolism ([place])   . Reflux   . Sleep apnea    wear CPAP  . Spinal stenosis   . Tobacco abuse   . Venous stasis   . Warfarin anticoagulation      Assessment: Pharmacy consulted to dose warfarin for patient with atrial fibrillation. INR on admission elevated at 3.15 and warfarin dose held on evening of 6/5 due to elevated INR.   PTA warfarin dose: 2.5mg  PO daily, except NO dose on Mondays  Last PTA dose noted to be on 6/4 per med rec.   Today, 10/07/17  INR now therapeutic after no warfarin doses since 6/6  CBC stable  No reported bleeding  Pt eating 50-100% meals per charting  Goal of Therapy:  INR 2-3 Monitor platelets by  anticoagulation protocol: Yes   Plan:  1) Warfarin 2.5mg  tonight at 1800 2) Monitor daily CBC/INR 3) Watch for s/sx of bleeding   Adrian Saran, PharmD, BCPS Pager [phone] 10/07/2017 7:14 AM

## 2017-10-08 ENCOUNTER — Ambulatory Visit
Admit: 2017-10-08 | Discharge: 2017-10-08 | Disposition: A | Discharge status: Home / Self Care | Payer: Medicare Other | Attending: Radiation Oncology | Admitting: Radiation Oncology

## 2017-10-08 DIAGNOSIS — C7931 Secondary malignant neoplasm of brain: Secondary | ICD-10-CM

## 2017-10-08 LAB — CBC
HEMATOCRIT: 40.9 % (ref 36.0–46.0)
HEMOGLOBIN: 13.6 g/dL (ref 12.0–15.0)
MCH: 31.1 pg (ref 26.0–34.0)
MCHC: 33.3 g/dL (ref 30.0–36.0)
MCV: 93.4 fL (ref 78.0–100.0)
PLATELETS: 198 10*3/uL (ref 150–400)
RBC: 4.38 MIL/uL (ref 3.87–5.11)
RDW: 13.1 % (ref 11.5–15.5)
WBC: 9.8 10*3/uL (ref 4.0–10.5)

## 2017-10-08 LAB — PROTIME-INR
INR: 2.47
Prothrombin Time: 26.6 seconds — ABNORMAL HIGH (ref 11.4–15.2)

## 2017-10-08 MED ORDER — WARFARIN 1.25 MG HALF TABLET
1.2500 mg | ORAL_TABLET | Freq: Once | ORAL | Status: AC
Start: 2017-10-08 — End: 2017-10-08
  Administered 2017-10-08: 1.25 mg via ORAL
  Filled 2017-10-08: qty 1

## 2017-10-08 NOTE — Care Management Important Message (Signed)
Important Message  Patient Details  Name: Sarah [name] MRN: [id number] Date of Birth: [birth year]   Medicare Important Message Given:  Yes    Kerin Salen 10/08/2017, 11:37 AMImportant Message  Patient Details  Name: Sarah [name] MRN: [id number] Date of Birth: [birth year]   Medicare Important Message Given:  Yes    Kerin Salen 10/08/2017, 11:37 AM

## 2017-10-08 NOTE — Progress Notes (Signed)
Provided SNF bed offers to pt's husband. Pasrr pending- faxed requested documents.  Sharren Bridge, MSW, LCSW Clinical Social Work 10/08/2017 [phone]

## 2017-10-08 NOTE — Clinical Social Work Note (Signed)
Clinical Social Work Assessment  Patient Details  Name: Sarah [name] MRN: [id number] Date of Birth: [birth year]  Date of referral:  10/08/17               Reason for consult:  Facility Placement                Permission sought to share information with:  Family Supports Permission granted to share information::  Yes, Verbal Permission Granted  Name::     Husband Legrand [place], daughter Armed forces operational officer::     Relationship::     Contact Information:     Housing/Transportation Living arrangements for the past 2 months:  Single Family Home Source of Information:  Patient, Adult Children, Spouse Patient Interpreter Needed:  None Criminal Activity/Legal Involvement Pertinent to Current Situation/Hospitalization:  No - Comment as needed Significant Relationships:  Spouse, Adult Children Lives with:  Spouse Do you feel safe going back to the place where you live?  Yes Need for family participation in patient care:  Yes (Comment)  Care giving concerns:  Pt admitted from home where she resides with her husband. Prior to admission was independent with ADLs and mobility, sustained 2 falls in past few days prior to admission. Is patient of [facility]- lung Ca post chemotherapy. Current treatment plan is for radiation per husband. Pt also found to have brain mass/mets and they state they have declined any surgical intervention and "immunotherapy or chemo is on hold for now."   Facilities manager / plan:  CSW consulted to assist with SNF placement. Met with pt and husband/daughter at bedside. All familiar with nature of short term rehab therapy due to friends having been. Are in agreement to refer pt. State that pt will need SNF to provide transport to radiation appointments, aware this limits SNF options. PASRR pending- will provide requested documents. Pt will need approved PASRR prior to SNF admission. Completed FL2 and made referrals- will follow up with bed offers.  Employment  status:  Retired Forensic scientist:  [employer] PT Recommendations:  [place], [place] / Referral to community resources:  [place]  Patient/Family's Response to care:  appreciative  Patient/Family's Understanding of and Emotional Response to Diagnosis, Current Treatment, and Prognosis:  Pt was pleasant and engaged but noted memory problems- was oriented but relied on husband to provide care need description - husband also demonstrated good understanding of the treatment decisions they have made. Emotionally both were appropriate.  Emotional Assessment Appearance:  Appears stated age Attitude/Demeanor/Rapport:  Engaged Affect (typically observed):  Pleasant Orientation:  Oriented to Self, Oriented to Place, Oriented to  Time, Oriented to Situation Alcohol / Substance use:  Not Applicable Psych involvement (Current and /or in the community):  No (Comment)  Discharge Needs  Concerns to be addressed:  Discharge Planning Concerns Readmission within the last 30 days:  No Current discharge risk:  Dependent with Mobility Barriers to Discharge:  No Barriers Identified   Nila Nephew, LCSW 10/08/2017, 10:17 AM [phone]

## 2017-10-08 NOTE — Progress Notes (Signed)
PROGRESS NOTE    Sarah [name]  GBT:[id number] DOB: 06/21/1950 DOA: 10/03/2017 PCP: [name], MD    Brief Narrative:   67 year old with past medical history relevant for COPD, chronic atrial fibrillation on warfarin, stage IIIa non-small cell lung cancer status post chemoradiation on immunotherapy of durvalmab [place] 16 cycles who was admitted for weakness and memory problems and was found to have large solitary metastatic disease from lung cancer with vasogenic edema and midline shift.   Assessment & Plan:   Active Problems:   Brain mass   Brain metastasis (HCC)   #) Non-small cell lung cancer complicated by metastatic disease to the brain: Patient continues to be quite unsteady on her feet. - Patient seen by neurosurgery, no plan with surgery at this time - Pending palliative radiotherapy for large solitary brain metastatic disease - Oncology following appreciate recommendations -Continue levetiracetam 500 mg twice daily -Continue dexamethasone 6 mg IV every 6 hours, will transition to PO on d/c -pending SNF placement  #) Chronic atrial fibrillation: Unfortunately the patient is on warfarin complicating any possible surgical treatment as well as problems with bleeding intracranially from her solitary metastatic disease. - Continue warfarin, pharmacy consult -Continue diltiazem 180 mg daily - Continue flecainide 50 mg twice daily  #) COPD: Patient is chronically on 2 L -Continue home LABA/ICS -Continue PRN bronchodilators  #) Pain/psych: -Continue bupropion 150 mg daily -Continue PRN morphine  Fluids: Tolerating p.o. Electrolytes: Monitor and supplement Nutrition: Regular diet  Prophylaxis: On warfarin  Disposition: Pending PT evaluation  DO NOT RESUSCITATE  Consultants:   Neurosurgery  Oncology  Radiation oncology  Procedures: (Don't include imaging studies which can be auto populated. Include things that cannot be auto populated i.e. Echo,  Carotid and venous dopplers, Foley, Bipap, HD, tubes/drains, wound vac, central lines etc)  None  Antimicrobials: (specify start and planned stop date. Auto populated tables are space occupying and do not give end dates)  None   Subjective: Patient reports that her symptoms are relatively unchanged from prior.  She continues to have word finding difficulty and LE weakness. She reports that her goals are to be home with her grand children. She denies any headache, N/V, diarrhea, chest pain cough, congestion.   Objective: Vitals:   10/07/17 0617 10/07/17 1345 10/07/17 2109 10/08/17 0445  BP: 121/63 111/60 (!) 122/56 119/69  Pulse: (!) 53 60 (!) 54 (!) 50  Resp: 12 16 (!) 21 20  Temp: 98.3 F (36.8 C) 97.9 F (36.6 C) 98.2 F (36.8 C) (!) 97.5 F (36.4 C)  TempSrc: Oral Oral Oral Oral  SpO2: 97% 98% 98% 99%  Weight:      Height:        Intake/Output Summary (Last 24 hours) at 10/08/2017 1326 Last data filed at 10/08/2017 0819 Gross per 24 hour  Intake 240 ml  Output 450 ml  Net -210 ml   Filed Weights   10/03/17 1320 10/03/17 2300  Weight: 104.8 kg (231 lb) 104.8 kg (231 lb 0.7 oz)    Examination:  General exam: Appears calm and comfortable  Respiratory system: Clear to auscultation. Respiratory effort normal. Cardiovascular system: Regular rate and rhythm, no murmurs Gastrointestinal system: Abdomen is nondistended, soft and nontender. No organomegaly or masses felt. Normal bowel sounds heard. Central nervous system: Alert and oriented. No focal neurological deficits.  Significantly difficult executive memory, bilateral strength unchanged however significantly uncoordinated Extremities: No lower extremity edema Skin: Port site is clean dry and intact Psychiatry: Judgement and insight  appear normal. Mood & affect appropriate.     Data Reviewed: I have personally reviewed following labs and imaging studies  CBC: Recent Labs  Lab 10/03/17 1530 10/05/17 0600  10/06/17 0518 10/07/17 0549 10/08/17 0551  WBC 6.2 13.0* 14.2* 12.1* 9.8  NEUTROABS 4.6  --   --   --   --   HGB 14.1 12.5 12.5 13.1 13.6  HCT 44.0 38.3 38.5 39.3 40.9  MCV 94.6 94.3 94.8 94.7 93.4  PLT 186 194 203 191 194   Basic Metabolic Panel: Recent Labs  Lab 10/03/17 1530  NA 142  K 3.6  CL 101  CO2 31  GLUCOSE 89  BUN 7  CREATININE 1.01*  CALCIUM 9.4   GFR: Estimated Creatinine Clearance: 70.6 mL/min (A) (by C-G formula based on SCr of 1.01 mg/dL (H)). Liver Function Tests: Recent Labs  Lab 10/03/17 1530  AST 21  ALT 20  ALKPHOS 94  BILITOT 0.5  PROT 7.2  ALBUMIN 3.9   Recent Labs  Lab 10/03/17 1530  LIPASE 31   No results for input(s): AMMONIA in the last 168 hours. Coagulation Profile: Recent Labs  Lab 10/04/17 0825 10/05/17 0600 10/06/17 0518 10/07/17 0549 10/08/17 0551  INR 2.86 3.40 3.31 2.50 2.47   Cardiac Enzymes: No results for input(s): CKTOTAL, CKMB, CKMBINDEX, TROPONINI in the last 168 hours. BNP (last 3 results) No results for input(s): PROBNP in the last 8760 hours. HbA1C: No results for input(s): HGBA1C in the last 72 hours. CBG: No results for input(s): GLUCAP in the last 168 hours. Lipid Profile: No results for input(s): CHOL, HDL, LDLCALC, TRIG, CHOLHDL, LDLDIRECT in the last 72 hours. Thyroid Function Tests: No results for input(s): TSH, T4TOTAL, FREET4, T3FREE, THYROIDAB in the last 72 hours. Anemia Panel: No results for input(s): VITAMINB12, FOLATE, FERRITIN, TIBC, IRON, RETICCTPCT in the last 72 hours. Sepsis Labs: No results for input(s): PROCALCITON, LATICACIDVEN in the last 168 hours.  No results found for this or any previous visit (from the past 240 hour(s)).       Radiology Studies: No results found.      Scheduled Meds: . buPROPion  150 mg Oral Daily  . calcium carbonate  1 tablet Oral Once per day on Mon Thu  . dexamethasone  6 mg Intravenous Q6H  . diltiazem  180 mg Oral Daily  . feeding  supplement (ENSURE ENLIVE)  237 mL Oral BID BM  . flecainide  50 mg Oral BID  . levETIRAcetam  500 mg Oral BID  . mometasone-formoterol  2 puff Inhalation BID  . pantoprazole  40 mg Oral q morning - 10a  . sodium chloride flush  3 mL Intravenous Q12H  . warfarin  1.25 mg Oral ONCE-1800  . Warfarin - Pharmacist Dosing Inpatient   Does not apply q1800   Continuous Infusions: . sodium chloride       LOS: 5 days    Time spent: Correll, MD [facility]   If 7PM-7AM, please contact night-coverage [email or web address] Password [email or web address] 10/08/2017, 1:26 PM

## 2017-10-08 NOTE — Progress Notes (Signed)
ANTICOAGULATION CONSULT NOTE - Follow-Up   Pharmacy Consult for warfarin Indication: atrial fibrillation  Allergies  Allergen Reactions  . Aspirin Nausea And Vomiting    Patient Measurements: Height: 5\' 9"  (175.3 cm)(stated) Weight: 231 lb 0.7 oz (104.8 kg) IBW/kg (Calculated) : 66.2  Vital Signs: Temp: 97.5 F (36.4 C) (06/10 0445) Temp Source: Oral (06/10 0445) BP: 119/69 (06/10 0445) Pulse Rate: 50 (06/10 0445)  Labs: Recent Labs    10/06/17 0518 10/07/17 0549 10/08/17 0551  HGB 12.5 13.1 13.6  HCT 38.5 39.3 40.9  PLT 203 191 198  LABPROT 33.4* 26.8* 26.6*  INR 3.31 2.50 2.47    Estimated Creatinine Clearance: 70.6 mL/min (A) (by C-G formula based on SCr of 1.01 mg/dL (H)).   Assessment: Pharmacy consulted to dose warfarin for patient with atrial fibrillation. INR on admission elevated at 3.15 and warfarin dose held on evening of 6/5 due to elevated INR.   PTA warfarin dose: 2.5mg  PO daily, except NO dose on Mondays  Last PTA dose noted to be on 6/4 per med rec.   Today, 10/08/17  INR therapeutic  At 2.47 (warfarin held 6/7 to 6/8 for SUPRAtherapeutic INR)  CBC stable  No reported bleeding  Pt eating 50-100% meals per charting  Found to have brain mets - on dexamethasone and levetiracetam  Goal of Therapy:  INR 2-3 Monitor platelets by anticoagulation protocol: Yes   Plan:  1) Warfarin 1.25mg  PO x 1  Patient normally does not take dose on Mon at home but did not require warfarin doses x 2 days this past week.  Reduce dose based on recent SUPRAtherapeutic INR 2) Monitor daily CBC/INR 3) Watch for s/sx of bleeding  Doreene Eland, PharmD, BCPS.   Pager: [phone] 10/08/2017 8:51 AM

## 2017-10-08 NOTE — NC FL2 (Signed)
[place] LEVEL OF CARE SCREENING TOOL     IDENTIFICATION  Patient Name: FLORINDA TAFLINGER Birthdate: January 27, 1951 Sex: female Admission Date (Current Location): 10/03/2017  [facility] and [place] Number:  Herbalist and Address:  [facility],  [place] [address], [place]      Provider Number: [id number]  Attending Physician Name and Address:  Cristy Folks, MD  Relative Name and Phone Number:       Current Level of Care: Hospital Recommended Level of Care: [place] Prior Approval Number:    Date Approved/Denied:   PASRR Number: pending  Discharge Plan: SNF    Current Diagnoses: Patient Active Problem List   Diagnosis Date Noted  . Brain metastasis ([place])   . Brain mass 10/03/2017  . Flank pain 04/11/2017  . Constipation 04/11/2017  . Port-A-Cath in place 03/28/2017  . Chronic fatigue 02/13/2017  . Encounter for antineoplastic immunotherapy 02/13/2017  . Chronic obstructive pulmonary disease (COPD) ([place]) 02/11/2017  . HCAP (healthcare-associated pneumonia) 12/18/2016  . Odynophagia 11/13/2016  . Chemotherapy induced nausea and vomiting 10/12/2016  . Chemotherapy-induced nausea 10/12/2016  . Goals of care, counseling/discussion 09/21/2016  . Encounter for antineoplastic chemotherapy 09/21/2016  . Squamous cell carcinoma of lung, left ([place]) 09/20/2016  . Lung mass 09/15/2016  . Arthritis 06/23/2013  . Atrial fibrillation, chronic ([place]) 08/30/2012  . Obesity, unspecified 08/30/2012  . Hypokalemia 08/30/2012  . Long term current use of anticoagulant therapy 08/03/2012  . OSA (obstructive sleep apnea) 05/23/2012  . History of pulmonary embolism 12/28/2011  . Tobacco abuse 12/28/2011  . Chronic anxiety 12/28/2011  . GERD (gastroesophageal reflux disease) 12/28/2011  . Warfarin anticoagulation 12/28/2011    Orientation RESPIRATION BLADDER Height & Weight     Self, Time, Place, Situation  O2(2L O2, cpap at  night) Incontinent, External catheter Weight: 231 lb 0.7 oz (104.8 kg) Height:  5\' 9"  (175.3 cm)(stated)  BEHAVIORAL SYMPTOMS/MOOD NEUROLOGICAL BOWEL NUTRITION STATUS      Continent Diet(regular diet)  AMBULATORY STATUS COMMUNICATION OF NEEDS Skin   Extensive Assist Verbally Normal                       Personal Care Assistance Level of Assistance  Bathing, Feeding, Dressing Bathing Assistance: Maximum assistance Feeding assistance: Limited assistance Dressing Assistance: Maximum assistance     Functional Limitations Info  Sight, Hearing, Speech Sight Info: Adequate Hearing Info: Adequate Speech Info: Adequate    SPECIAL CARE FACTORS FREQUENCY  PT (By licensed PT), OT (By licensed OT)     PT Frequency: 5x OT Frequency: 5x            Contractures Contractures Info: Not present    Additional Factors Info  Code Status, Allergies Code Status Info: DNR Allergies Info: aspirin           Current Medications (10/08/2017):  This is the current hospital active medication list Current Facility-Administered Medications  Medication Dose Route Frequency Provider Last Rate Last Dose  . 0.9 %  sodium chloride infusion  250 mL Intravenous PRN Merton Border, MD      . acetaminophen (TYLENOL) tablet 650 mg  650 mg Oral Q6H PRN Merton Border, MD       Or  . acetaminophen (TYLENOL) suppository 650 mg  650 mg Rectal Q6H PRN Merton Border, MD      . albuterol (PROVENTIL) (2.5 MG/3ML) 0.083% nebulizer solution 2.5 mg  2.5 mg Nebulization Q6H PRN Merton Border, MD      .  buPROPion (WELLBUTRIN SR) 12 hr tablet 150 mg  150 mg Oral Daily Merton Border, MD   150 mg at 10/08/17 0926  . calcium carbonate (TUMS - dosed in mg elemental calcium) chewable tablet 200 mg of elemental calcium  1 tablet Oral Once per day on Mon Thu Hijazi, Ali, MD   200 mg of elemental calcium at 10/08/17 0933  . dexamethasone (DECADRON) injection 6 mg  6 mg Intravenous Q6H Jani Gravel, MD   6 mg at 10/08/17 0550  .  diltiazem (CARDIZEM CD) 24 hr capsule 180 mg  180 mg Oral Daily Merton Border, MD   180 mg at 10/08/17 0926  . feeding supplement (ENSURE ENLIVE) (ENSURE ENLIVE) liquid 237 mL  237 mL Oral BID BM Jani Gravel, MD   237 mL at 10/08/17 0926  . flecainide (TAMBOCOR) tablet 50 mg  50 mg Oral BID Merton Border, MD   50 mg at 10/08/17 0926  . HYDROcodone-acetaminophen (NORCO) 7.5-325 MG per tablet 2 tablet  2 tablet Oral Q4H PRN Merton Border, MD   2 tablet at 10/08/17 0926  . levETIRAcetam (KEPPRA) tablet 500 mg  500 mg Oral BID Jani Gravel, MD   500 mg at 10/08/17 0926  . mometasone-formoterol (DULERA) 200-5 MCG/ACT inhaler 2 puff  2 puff Inhalation BID Merton Border, MD      . morphine 2 MG/ML injection 2 mg  2 mg Intravenous Q4H PRN Merton Border, MD      . ondansetron (ZOFRAN) tablet 4 mg  4 mg Oral Q6H PRN Merton Border, MD       Or  . ondansetron (ZOFRAN) injection 4 mg  4 mg Intravenous Q6H PRN Merton Border, MD      . pantoprazole (PROTONIX) EC tablet 40 mg  40 mg Oral q morning - 10a Merton Border, MD   40 mg at 10/08/17 0926  . sodium chloride flush (NS) 0.9 % injection 3 mL  3 mL Intravenous Q12H Merton Border, MD   3 mL at 10/08/17 0927  . sodium chloride flush (NS) 0.9 % injection 3 mL  3 mL Intravenous PRN Merton Border, MD      . warfarin (COUMADIN) tablet 1.25 mg  1.25 mg Oral ONCE-1800 Berton Mount, RPH      . Warfarin - Pharmacist Dosing Inpatient   Does not apply q1800 Jalene Mullet, RPH      . zolpidem (AMBIEN) tablet 5 mg  5 mg Oral QHS PRN Merton Border, MD         Discharge Medications: Please see discharge summary for a list of discharge medications.  Relevant Imaging Results:  Relevant Lab Results:   Additional Information [id number]. Has radiation at [facility] 10/12/17, needs transportation  [employer], LCSW

## 2017-10-09 DIAGNOSIS — R413 Other amnesia: Secondary | ICD-10-CM | POA: Diagnosis not present

## 2017-10-09 DIAGNOSIS — R112 Nausea with vomiting, unspecified: Secondary | ICD-10-CM | POA: Diagnosis not present

## 2017-10-09 DIAGNOSIS — I499 Cardiac arrhythmia, unspecified: Secondary | ICD-10-CM | POA: Diagnosis not present

## 2017-10-09 DIAGNOSIS — C3490 Malignant neoplasm of unspecified part of unspecified bronchus or lung: Secondary | ICD-10-CM | POA: Diagnosis not present

## 2017-10-09 DIAGNOSIS — G939 Disorder of brain, unspecified: Secondary | ICD-10-CM | POA: Diagnosis not present

## 2017-10-09 DIAGNOSIS — J449 Chronic obstructive pulmonary disease, unspecified: Secondary | ICD-10-CM | POA: Diagnosis not present

## 2017-10-09 DIAGNOSIS — C3492 Malignant neoplasm of unspecified part of left bronchus or lung: Secondary | ICD-10-CM | POA: Diagnosis not present

## 2017-10-09 DIAGNOSIS — R41841 Cognitive communication deficit: Secondary | ICD-10-CM | POA: Diagnosis not present

## 2017-10-09 DIAGNOSIS — M6281 Muscle weakness (generalized): Secondary | ICD-10-CM | POA: Diagnosis not present

## 2017-10-09 DIAGNOSIS — I482 Chronic atrial fibrillation: Secondary | ICD-10-CM | POA: Diagnosis not present

## 2017-10-09 DIAGNOSIS — R590 Localized enlarged lymph nodes: Secondary | ICD-10-CM | POA: Diagnosis not present

## 2017-10-09 DIAGNOSIS — M1990 Unspecified osteoarthritis, unspecified site: Secondary | ICD-10-CM | POA: Diagnosis not present

## 2017-10-09 DIAGNOSIS — I4891 Unspecified atrial fibrillation: Secondary | ICD-10-CM | POA: Diagnosis not present

## 2017-10-09 DIAGNOSIS — F411 Generalized anxiety disorder: Secondary | ICD-10-CM | POA: Diagnosis not present

## 2017-10-09 DIAGNOSIS — F322 Major depressive disorder, single episode, severe without psychotic features: Secondary | ICD-10-CM | POA: Diagnosis not present

## 2017-10-09 DIAGNOSIS — K219 Gastro-esophageal reflux disease without esophagitis: Secondary | ICD-10-CM | POA: Diagnosis not present

## 2017-10-09 DIAGNOSIS — R531 Weakness: Secondary | ICD-10-CM

## 2017-10-09 DIAGNOSIS — R278 Other lack of coordination: Secondary | ICD-10-CM | POA: Diagnosis not present

## 2017-10-09 DIAGNOSIS — R52 Pain, unspecified: Secondary | ICD-10-CM | POA: Diagnosis not present

## 2017-10-09 DIAGNOSIS — F331 Major depressive disorder, recurrent, moderate: Secondary | ICD-10-CM | POA: Diagnosis not present

## 2017-10-09 DIAGNOSIS — Z66 Do not resuscitate: Secondary | ICD-10-CM | POA: Diagnosis not present

## 2017-10-09 DIAGNOSIS — I10 Essential (primary) hypertension: Secondary | ICD-10-CM | POA: Diagnosis not present

## 2017-10-09 DIAGNOSIS — K5900 Constipation, unspecified: Secondary | ICD-10-CM | POA: Diagnosis not present

## 2017-10-09 DIAGNOSIS — R682 Dry mouth, unspecified: Secondary | ICD-10-CM | POA: Diagnosis not present

## 2017-10-09 DIAGNOSIS — E669 Obesity, unspecified: Secondary | ICD-10-CM | POA: Diagnosis not present

## 2017-10-09 DIAGNOSIS — H04129 Dry eye syndrome of unspecified lacrimal gland: Secondary | ICD-10-CM | POA: Diagnosis not present

## 2017-10-09 DIAGNOSIS — R569 Unspecified convulsions: Secondary | ICD-10-CM | POA: Diagnosis not present

## 2017-10-09 DIAGNOSIS — Z86718 Personal history of other venous thrombosis and embolism: Secondary | ICD-10-CM | POA: Diagnosis not present

## 2017-10-09 DIAGNOSIS — Z9181 History of falling: Secondary | ICD-10-CM | POA: Diagnosis not present

## 2017-10-09 DIAGNOSIS — R279 Unspecified lack of coordination: Secondary | ICD-10-CM | POA: Diagnosis not present

## 2017-10-09 DIAGNOSIS — F419 Anxiety disorder, unspecified: Secondary | ICD-10-CM | POA: Diagnosis not present

## 2017-10-09 DIAGNOSIS — E46 Unspecified protein-calorie malnutrition: Secondary | ICD-10-CM | POA: Diagnosis not present

## 2017-10-09 DIAGNOSIS — C7931 Secondary malignant neoplasm of brain: Secondary | ICD-10-CM | POA: Diagnosis not present

## 2017-10-09 DIAGNOSIS — Z510 Encounter for antineoplastic radiation therapy: Secondary | ICD-10-CM | POA: Diagnosis not present

## 2017-10-09 LAB — CBC
HEMATOCRIT: 41.8 % (ref 36.0–46.0)
HEMOGLOBIN: 14.2 g/dL (ref 12.0–15.0)
MCH: 31.3 pg (ref 26.0–34.0)
MCHC: 34 g/dL (ref 30.0–36.0)
MCV: 92.3 fL (ref 78.0–100.0)
Platelets: 198 10*3/uL (ref 150–400)
RBC: 4.53 MIL/uL (ref 3.87–5.11)
RDW: 13.1 % (ref 11.5–15.5)
WBC: 10.9 10*3/uL — ABNORMAL HIGH (ref 4.0–10.5)

## 2017-10-09 LAB — PROTIME-INR
INR: 2.77
Prothrombin Time: 29.1 s — ABNORMAL HIGH (ref 11.4–15.2)

## 2017-10-09 MED ORDER — WARFARIN SODIUM 1 MG PO TABS
1.0000 mg | ORAL_TABLET | Freq: Once | ORAL | Status: DC
  Filled 2017-10-09: qty 1

## 2017-10-09 MED ORDER — DEXAMETHASONE 6 MG PO TABS: 4.0000 mg | ORAL_TABLET | Freq: Four times a day (QID) | ORAL | 0 refills | Status: AC

## 2017-10-09 MED ORDER — BUPROPION HCL ER (SR) 150 MG PO TB12: 150.0000 mg | ORAL_TABLET | Freq: Every day | ORAL | 0 refills | Status: AC

## 2017-10-09 MED ORDER — LEVETIRACETAM 500 MG PO TABS: 500.0000 mg | ORAL_TABLET | Freq: Two times a day (BID) | ORAL | 0 refills | Status: AC

## 2017-10-09 MED ORDER — DEXAMETHASONE 6 MG PO TABS: 3.0000 mg | ORAL_TABLET | Freq: Three times a day (TID) | ORAL | 0 refills | Status: DC

## 2017-10-09 MED ORDER — ENSURE ENLIVE PO LIQD: 237.0000 mL | Freq: Two times a day (BID) | ORAL | 0 refills | Status: AC

## 2017-10-09 NOTE — Progress Notes (Signed)
IM contacted Dr. Harrington Challenger to discuss opinion of continuing anticoagulation with brain mets. Dr. Harrington Challenger feels this is more a neurosurgery question and asked IM to communicate with neurosurgery (we know stroke risk, just need to know bleed risk). Was asked by Dr. Harrington Challenger to arrange close f/u. Next available appt in [place] is 7/3 which Dr. Harrington Challenger feels is OK. This is with Bernerd Pho on a day when Dr. Domenic Polite is in the office. Put appt on AVS and notified nurse who had not printed AVS yet. Dayna Dunn PA-C

## 2017-10-09 NOTE — Discharge Instructions (Signed)
Metastatic Brain Tumor A metastatic brain tumor is a growth in your brain. It is made of cancer cells from another part of your body that traveled to your brain through your bloodstream, along the nerves of your brain (cranial nerves), or through the opening at the base of your skull. What are the causes? The most common cause of a metastatic brain tumor in men is lung cancer. In women, it is breast cancer. Other common causes include:  Unknown types of cancers.  Skin cancer (melanoma).  Colon cancer.  Kidney cancer.  What are the signs or symptoms? Most signs and symptoms of metastatic brain tumors are caused by increased pressure inside your brain. A headache is often the first symptom. Eventually, almost everyone with a metastatic brain tumor has a headache. Other signs and symptoms include:  Vomiting.  Weakness or fatigue.  Seizures.  Sensory problems, like tingling or numbness.  Problems walking.  Clumsiness.  Emotional changes.  Personality changes.  Changes in speech or vision.  How is this diagnosed? Your health care provider can diagnose a metastatic brain tumor from your medical history and physical exam. You may also have some additional tests, including:  A nervous system function test (neurologic exam).  Imaging studies of your brain, such as an MRI and CT scan.  Having a small piece of tissue removed (biopsy) from your brain to be checked under a microscope.  How is this treated? Treatment depends on the type of cancer you have and your overall health. It also depends on the size of your tumor and the number of brain tumors you have. The most common treatments include:  Medicines to control pain, nausea, and seizures.  Cancer-killing drugs (chemotherapy).  An X-ray treatment called radiation therapy to kill brain tumor cells.  A type of radiation therapy that is targeted to exact locations in the brain (stereotactic radiotherapy).  Surgery to remove  brain tumors.  Follow these instructions at home:  Only take medicines as directed by your health care provider.  Do not drive if: ? You are taking strong pain medicines. ? Your vision or thinking is not clear.  Take two 10-minute walks every day if you are able. Exercising is a good way to relieve stress. It can also help you sleep.  Be sure to get enough calories and protein in your diet. ? If you have a poor appetite or feel nauseous, eat small meals often. ? Supplement your diet with protein shakes or milk shakes.  It is common to have anxiety and depression when you are coping with cancer. ? Talk to friends and loved ones about your feelings. ? Have a good support system at home. Contact a health care provider if:  You have chills or fever.  Your medicine is not controlling your symptoms.  Your symptoms get worse or you develop new symptoms.  You are having trouble caring for yourself or being cared for at home.  You are struggling with anxiety or depression. Get help right away if:  You cannot stop vomiting.  You cannot keep down any foods or fluids.  You have sudden changes in speech or vision.  You have a seizure.  You can no longer take care of yourself at home. This information is not intended to replace advice given to you by your health care provider. Make sure you discuss any questions you have with your health care provider. Document Released: 01/12/2004 Document Revised: 09/23/2015 Document Reviewed: 04/08/2013 Elsevier Interactive Patient Education  2017  Elsevier Inc. ° °

## 2017-10-09 NOTE — Progress Notes (Signed)
ANTICOAGULATION CONSULT NOTE - Follow-Up   Pharmacy Consult for warfarin Indication: atrial fibrillation  Allergies  Allergen Reactions  . Aspirin Nausea And Vomiting    Patient Measurements: Height: 5\' 9"  (175.3 cm)(stated) Weight: 231 lb 0.7 oz (104.8 kg) IBW/kg (Calculated) : 66.2  Vital Signs: Temp: 97.9 F (36.6 C) (06/11 0518) Temp Source: Oral (06/11 0518) BP: 128/63 (06/11 0518) Pulse Rate: 54 (06/11 0518)  Labs: Recent Labs    10/07/17 0549 10/08/17 0551 10/09/17 0606  HGB 13.1 13.6 14.2  HCT 39.3 40.9 41.8  PLT 191 198 198  LABPROT 26.8* 26.6* 29.1*  INR 2.50 2.47 2.77    Estimated Creatinine Clearance: 70.6 mL/min (A) (by C-G formula based on SCr of 1.01 mg/dL (H)).   Assessment: Pharmacy consulted to dose warfarin for patient with atrial fibrillation. INR on admission elevated at 3.15 and warfarin dose held on evening of 6/5 due to elevated INR. Doses also held on 6/7 and 6/8. INR today is within goal range at 2.77. CBC is stable.   PTA warfarin dose: 2.5mg  PO daily, except NO dose on Mondays  Last PTA dose noted to be on 6/4 per med rec.    Goal of Therapy:  INR 2-3 Monitor platelets by anticoagulation protocol: Yes   Plan:  - Warfarin 1 mg PO x 1 - Monitor daily CBC/INR - Watch for s/sx of bleeding  Jalene Mullet, Pharm.D. PGY1 Pharmacy Resident 10/09/2017 8:27 AM

## 2017-10-09 NOTE — Clinical Social Work Placement (Signed)
Pt received approved PASRR #[id number] E Admitting to [facility] - report [phone] Pt's husband going to facility to complete admission paperwork 1:30pm- once complete will arrange [facility] transportation. DC information provided via the [place]  NOTE  Date:  10/09/2017  Patient Details  Name: Sarah [name] MRN: [id number] Date of Birth: February 09, 1951  Clinical Social Work is seeking post-discharge placement for this patient at the [place] level of care (*CSW will initial, date and re-position this form in  chart as items are completed):  Yes   Patient/family provided with [place] Work Department's list of facilities offering this level of care within the geographic area requested by the patient (or if unable, by the patient's family).  Yes   Patient/family informed of their freedom to choose among providers that offer the needed level of care, that participate in Medicare, Medicaid or managed care program needed by the patient, have an available bed and are willing to accept the patient.  Yes   Patient/family informed of [facility]'s ownership interest in [facility] and [facility], as well as of the fact that they are under no obligation to receive care at these facilities.  PASRR submitted to EDS on 10/08/17     PASRR number received on 10/09/17     Existing PASRR number confirmed on       FL2 transmitted to all facilities in geographic area requested by pt/family on       FL2 transmitted to all facilities within larger geographic area on       Patient informed that his/her managed care company has contracts with or will negotiate with certain facilities, including the following:        Yes   Patient/family informed of bed offers received.  Patient chooses bed at [facility]     Physician recommends and patient chooses bed at [facility]    Patient to be  transferred to [facility] on 10/09/17.  Patient to be transferred to facility by PTAR     Patient family notified on 10/09/17 of transfer.  Name of family member notified:  husband Legrand Como     PHYSICIAN       Additional Comment:    _______________________________________________ Nila Nephew, LCSW 10/09/2017, 12:41 PM [phone]

## 2017-10-09 NOTE — Progress Notes (Signed)
Patient discharged to SNF, copies of all discharge medications and instructions sent to facility. Report called to receiving nurse. Patient to be transported via [place].

## 2017-10-09 NOTE — Discharge Summary (Addendum)
Discharge Summary  Sarah [name] OHY:[id number] DOB: 1950-12-14  PCP: [name], MD  Admit date: 10/03/2017 Discharge date: 10/09/2017  Time spent: 25 minutes  Recommendations for Outpatient Follow-up:  1. Follow-up with oncology 2. Follow-up with neurosurgery 3. Follow-up with PCP 4. Follow up with cardiology  Discharge Diagnoses:  Active Hospital Problems   Diagnosis Date Noted  . Brain metastasis ([employer])   . Brain mass 10/03/2017    Resolved Hospital Problems  No resolved problems to display.    Discharge Condition: Stable  Diet recommendation: Resume previous diet  Vitals:   10/08/17 2201 10/09/17 0518  BP: 125/66 128/63  Pulse: (!) 57 (!) 54  Resp: 16 18  Temp: 97.6 F (36.4 C) 97.9 F (36.6 C)  SpO2: 96% 97%    History of present illness:  67 year old with past medical history relevant for COPD, chronic atrial fibrillation on warfarin, stage IIIa non-small cell lung cancer status post chemoradiation on immunotherapy of durvalmab [place] 16 cycles who was admitted for weakness and memory problems and was found to have large solitary metastatic disease from lung cancer with vasogenic edema and midline shift.  Improvement of symptoms on Decadron. Alert With word finding difficulty.  Neurosurgery and oncology followed.  Continue Decadron and Keppra as recommended by neurosurgery.  She is on Coumadin for chronic A. fib with therapeutic INR.  On the day of discharge the patient was hemodynamically stable she has no new complaints.  Stated mild headache 3 out of 10.  Denied nausea.  Denied focal weakness.    Hospital Course:  Active Problems:   Brain mass   Brain metastasis (HCC)  Non-small cell lung cancer complicated by metastatic disease to the brain Followed by neurology and oncology Continue Keppra 500 mg twice daily Continue Decadron p.o. Dr. Earlie Server recommends Decadron 3 mg p.o. every 6 hours Palliative radiotherapy planned for large solitary  brain metastatic disease outpatient On Coumadin.  Discussed with Dr. Earlie Server this morning whether or not to continue anticoagulation.  He defers oral anticoagulation to cardiology. CHADVASC score 2- will follow up with cardiology outpatient OK to continue coumadin per Dr Christella Noa  Paroxysmal A. fib Rate controlled On Coumadin INR is therapeutic at 2.47 Continue to monitor INR Continue diltiazem Continue flecainide Cardiology consulted to decide on whether or not to continue anticoagulation-Deffered to neurosurgery Spoke with Dr Christella Noa, ok to continue coumadin  Chronic depression/anxiety Continue bupropion  COPD Continue home medications Continue O2 supplementation as needed to maintain O2 saturation 92% or greater   Procedures:  None  Consultations:  Neurosurgery  Oncology  Discharge Exam: BP 128/63 (BP Location: Left Arm)   Pulse (!) 54   Temp 97.9 F (36.6 C) (Oral)   Resp 18   Ht 5\' 9"  (1.753 m) Comment: stated  Wt 104.8 kg (231 lb 0.7 oz)   SpO2 97%   BMI 34.12 kg/m  . General: 68 y.o. year-old female well developed well nourished in no acute distress.  Alert.  Difficulty with word finding. . Cardiovascular: Regular rate and rhythm with no rubs or gallops.  No thyromegaly or JVD noted.   Marland Kitchen Respiratory: Clear to auscultation with no wheezes or rales. Good inspiratory effort. . Abdomen: Soft nontender nondistended with normal bowel sounds x4 quadrants. . Musculoskeletal: Trace edema in lower extremities bilaterally.  Hyperpigmentation affecting left lower extremity. 2/4 pulses in all 4 extremities. Marland Kitchen Psychiatry: Mood is appropriate for condition and setting  Discharge Instructions You were cared for by a hospitalist during your hospital  stay. If you have any questions about your discharge medications or the care you received while you were in the hospital after you are discharged, you can call the unit and asked to speak with the hospitalist on call if the  hospitalist that took care of you is not available. Once you are discharged, your primary care physician will handle any further medical issues. Please note that NO REFILLS for any discharge medications will be authorized once you are discharged, as it is imperative that you return to your primary care physician (or establish a relationship with a primary care physician if you do not have one) for your aftercare needs so that they can reassess your need for medications and monitor your lab values.   Allergies as of 10/09/2017      Reactions   Aspirin Nausea And Vomiting      Medication List    STOP taking these medications   FIRST-DUKES MOUTHWASH Susp   HYDROcodone-acetaminophen 7.5-325 MG tablet Commonly known as:  NORCO   ketoconazole 2 % cream Commonly known as:  NIZORAL   lidocaine-prilocaine cream Commonly known as:  EMLA   metoprolol tartrate 25 MG tablet Commonly known as:  LOPRESSOR     TAKE these medications   albuterol 108 (90 Base) MCG/ACT inhaler Commonly known as:  PROVENTIL HFA;VENTOLIN HFA Inhale 2 puffs into the lungs every 6 (six) hours as needed for wheezing or shortness of breath.   budesonide-formoterol 160-4.5 MCG/ACT inhaler Commonly known as:  SYMBICORT Inhale 2 puffs into the lungs 2 (two) times daily.   buPROPion 150 MG 12 hr tablet Commonly known as:  WELLBUTRIN SR Take 1 tablet (150 mg total) by mouth daily. Start taking on:  10/10/2017 What changed:    how much to take  how to take this  when to take this  additional instructions   calcium carbonate 500 MG chewable tablet Commonly known as:  TUMS - dosed in mg elemental calcium Chew 1 tablet by mouth 2 (two) times a week.   dexamethasone 6 MG tablet Commonly known as:  DECADRON Take 0.5 tablets (3 mg total) by mouth 4 (four) times daily.   diltiazem 180 MG 24 hr capsule Commonly known as:  CARDIZEM CD TAKE ONE CAPSULE BY MOUTH EVERY DAY   feeding supplement (ENSURE ENLIVE)  Liqd Take 237 mLs by mouth 2 (two) times daily between meals.   flecainide 50 MG tablet Commonly known as:  TAMBOCOR TAKE 1 TABLET BY MOUTH TWICE A DAY   levETIRAcetam 500 MG tablet Commonly known as:  KEPPRA Take 1 tablet (500 mg total) by mouth 2 (two) times daily.   pantoprazole 40 MG tablet Commonly known as:  PROTONIX Take 1 tablet (40 mg total) by mouth every morning.   promethazine 25 MG tablet Commonly known as:  PHENERGAN TAKE 1 TABLET BY MOUTH EVERY 6 HOURS AS NEEDED FOR NAUSEA/VOMITING   sucralfate 1 g tablet Commonly known as:  CARAFATE Take 1 tablet (1 g total) by mouth 2 (two) times daily.   warfarin 5 MG tablet Commonly known as:  COUMADIN Take as directed. If you are unsure how to take this medication, talk to your nurse or doctor. Original instructions:  Take 0-2.5 mg by mouth daily. Take 2.5 mg Daily except none on Monday      Allergies  Allergen Reactions  . Aspirin Nausea And Vomiting    Contact information for follow-up providers    [name], MD. Call in 1 day(s).  Specialty:  Family Medicine Why:  please call for appointment Contact information: [place] [address] [phone]        Ashok Pall, MD. Call in 1 day(s).   Specialty:  Neurosurgery Why:  please call for an appointment Contact information: [address] [place] [address] [phone]        Curt Bears, MD. Call in 1 day(s).   Specialty:  Oncology Why:  please call for an appointment Contact information: [place] [address] [phone]            Contact information for after-discharge care    Destination    [facility] .   Service:  Skilled Nursing Contact information: [place] [phone]                   The results of significant diagnostics from this hospitalization (including  imaging, microbiology, ancillary and laboratory) are listed below for reference.    Significant Diagnostic Studies: Ct Chest W Contrast  Result Date: 10/03/2017 CLINICAL DATA:  67 year old female with increasing confusion for the past several weeks. Patient has chronic shortness of breath and cough. History of lung cancer. EXAM: CT CHEST WITH CONTRAST TECHNIQUE: Multidetector CT imaging of the chest was performed during intravenous contrast administration. CONTRAST:  60mL OMNIPAQUE IOHEXOL 300 MG/ML  SOLN COMPARISON:  Chest CT 08/13/2017. FINDINGS: Cardiovascular: Heart size is normal. There is no significant pericardial fluid, thickening or pericardial calcification. Dilatation of the pulmonic trunk (3.8 cm in diameter). Aortic atherosclerosis. No definite coronary artery calcifications. Right internal jugular single-lumen PermCath with tip terminating in the mid superior vena cava. Mediastinum/Nodes: No pathologically enlarged mediastinal or hilar lymph nodes. Esophagus is unremarkable in appearance. No axillary lymphadenopathy. Lungs/Pleura: Previously treated left lower lobe mass is no longer clearly evident. There is some amorphous soft tissue in the region of the treated mass which roughly measures 3.3 x 6.0 cm (axial image 60 of series 2), however, which portion of this tissue is simply collapsed lung versus which portion represents residual or recurrent tumor is unclear on today's examination. Adjacent to this abnormal tissue there is near complete obstruction of the left lower lobe bronchus best appreciated on axial image 68 of series 2. Extensive atelectasis is present in the left lower lobe, in addition to additional areas of ground-glass attenuation, septal thickening, thickening of the peribronchovascular interstitium and regional architectural distortion throughout the left upper lobe which likely reflects chronic postradiation changes. Trace chronic left pleural effusion, similar to the prior  study. A small amount of scarring is also noted in the medial segment of the right middle lobe and medial aspect of the right upper lobe. Diffuse bronchial wall thickening with mild to moderate centrilobular emphysema. No new suspicious appearing pulmonary nodules or masses are noted. Upper Abdomen: Status post cholecystectomy. 3.0 x 2.8 cm right adrenal nodule, similar to prior examinations, previously characterized as an adenoma. 2.4 cm simple cyst in the upper pole of the right kidney. Musculoskeletal: There are no aggressive appearing lytic or blastic lesions noted in the visualized portions of the skeleton. IMPRESSION: 1. Post treatment related changes in the left lung, with overall stable appearance of the left lung including poorly defined mass-like area in the posterior aspect of the left lower lobe, as discussed above. 2. No acute findings are noted. 3. Dilatation of the pulmonic trunk (3.8 cm in diameter), concerning for pulmonary arterial hypertension. 4.  Diffuse bronchial wall thickening with mild to moderate centrilobular emphysema. 5. Additional incidental findings, as above. Aortic Atherosclerosis (ICD10-I70.0) and Emphysema (ICD10-J43.9). Electronically Signed   By: Vinnie Langton M.D.   On: 10/03/2017 17:38   Mr Jeri Cos PQ Contrast  Result Date: 10/05/2017 CLINICAL DATA:  Headache with weakness. Subacute neuro deficits. Lung cancer. Preop SRS. EXAM: MRI HEAD WITHOUT AND WITH CONTRAST TECHNIQUE: Multiplanar, multiecho pulse sequences of the brain and surrounding structures were obtained without and with intravenous contrast. CONTRAST:  14mL MULTIHANCE GADOBENATE DIMEGLUMINE 529 MG/ML IV SOLN COMPARISON:  MR head 10/03/2017.  MR head 09/12/2016. FINDINGS: Brain: Redemonstrated is a solitary brain lesion, intra-axial but presenting on the superficial aspect of the LEFT posterior temporal and occipital lobes, centrally necrotic, peripherally restricted, most consistent with metastatic lung cancer.  No significant change in size from that reported previously, 43 x 62 x 42 mm (R-L x A-P x C-C). No extension via the corpus callosum into the contralateral hemisphere, but the lesion does cross to the contralateral side below the corpus callosum. LEFT-to-RIGHT shift due to vasogenic edema and trapped temporal horn measuring 7 mm. Early LEFT uncal herniation. Regional enhancement of the dura along the tentorium as well as the interhemispheric fissure (series 12, image 16); the ependymal enhancement raises concern for potential CSF spread of tumor. No other definite metastatic deposits are seen on today's 3T scan, although post infusion imaging is motion degraded. Vascular: Normal flow voids. Skull and upper cervical spine: Normal marrow signal. No upper cervical spine abnormalities. Sinuses/Orbits: Negative. Other: None IMPRESSION: Stable, solitary LEFT temporo-occipital brain lesion, likely necrotic metastasis. 43 x 62 x 42 mm, significant vasogenic edema, with trapped LEFT temporal horn contributing to LEFT-to-RIGHT shift of 7 mm. Electronically Signed   By: Staci Righter M.D.   On: 10/05/2017 17:32   Mr Jeri Cos ZR Contrast  Result Date: 10/03/2017 CLINICAL DATA:  Headache and weakness. Neuro deficits, subacute. Lung cancer. EXAM: MRI HEAD WITHOUT AND WITH CONTRAST TECHNIQUE: Multiplanar, multiecho pulse sequences of the brain and surrounding structures were obtained without and with intravenous contrast. CONTRAST:  97mL MULTIHANCE GADOBENATE DIMEGLUMINE 529 MG/ML IV SOLN COMPARISON:  MRI brain 09/12/2016.  Whole-body PET scan 09/13/2016 FINDINGS: Brain: A peripherally enhancing heterogeneous mass lesion is present in the medial left temporal, parietal, and occipital lobe. The lesion measures 6.2 x 4.2 x 4.3 cm. There is involvement of the dura along the interhemispheric falx, and the tentorium. The lesion crosses midline just below the corpus callosum. There is also involvement of the left ventricular ependyma.  There is mass effect on the left ventricle. Surrounding T2 signal changes are compatible with vasogenic edema. Midline shift measures up to 8 mm. No distal enhancing lesions are present. Periventricular and subcortical white matter changes are otherwise stable. The right lateral ventricle is stable in size. Left temporal horn is enlarged and may be partially occluded. There is some mass effect on the para mesencephalic cisterns. Brainstem and cerebellum are otherwise within normal limits. Vascular: Flow is present in the major intracranial arteries. Skull and upper cervical spine: The skull base is within normal limits. Visualized cervical spine is normal. Sinuses/Orbits: The paranasal sinuses and mastoid air cells are clear. Globes and orbits are within normal limits. IMPRESSION: 1. Heterogeneous predominantly peripherally enhancing mass lesion involving the medial left temporal, parietal, and occipital lobes measures 6.2 x 4.2 x 4.3 cm. This is most consistent with a solitary metastasis from the patient's known lung cancer. 2. The lesion involves the dura  and invades the left ventricle with enhancement along the ependyma. 3. Mass effect with surrounding vasogenic edema and midline shift of up to 8 mm. 4. Dilation of the left temporal tip suggests obstruction by the mass. 5. Periventricular and subcortical white matter disease is otherwise stable. These results were called by telephone at the time of interpretation on 10/03/2017 at 3:37 pm to Dr. Fredia Sorrow , who verbally acknowledged these results. Electronically Signed   By: San Morelle M.D.   On: 10/03/2017 15:37    Microbiology: No results found for this or any previous visit (from the past 240 hour(s)).   Labs: Basic Metabolic Panel: Recent Labs  Lab 10/03/17 1530  NA 142  K 3.6  CL 101  CO2 31  GLUCOSE 89  BUN 7  CREATININE 1.01*  CALCIUM 9.4   Liver Function Tests: Recent Labs  Lab 10/03/17 1530  AST 21  ALT 20  ALKPHOS  94  BILITOT 0.5  PROT 7.2  ALBUMIN 3.9   Recent Labs  Lab 10/03/17 1530  LIPASE 31   No results for input(s): AMMONIA in the last 168 hours. CBC: Recent Labs  Lab 10/03/17 1530 10/05/17 0600 10/06/17 0518 10/07/17 0549 10/08/17 0551 10/09/17 0606  WBC 6.2 13.0* 14.2* 12.1* 9.8 10.9*  NEUTROABS 4.6  --   --   --   --   --   HGB 14.1 12.5 12.5 13.1 13.6 14.2  HCT 44.0 38.3 38.5 39.3 40.9 41.8  MCV 94.6 94.3 94.8 94.7 93.4 92.3  PLT 186 194 203 191 198 198   Cardiac Enzymes: No results for input(s): CKTOTAL, CKMB, CKMBINDEX, TROPONINI in the last 168 hours. BNP: BNP (last 3 results) Recent Labs    03/03/17 0533  BNP 18.0    ProBNP (last 3 results) No results for input(s): PROBNP in the last 8760 hours.  CBG: No results for input(s): GLUCAP in the last 168 hours.     Signed:  Kayleen Memos, MD [facility] 10/09/2017, 12:44 PM

## 2017-10-10 ENCOUNTER — Inpatient Hospital Stay: Payer: Medicare Other | Attending: Internal Medicine | Admitting: Oncology

## 2017-10-10 ENCOUNTER — Inpatient Hospital Stay: Payer: Medicare Other

## 2017-10-10 ENCOUNTER — Telehealth: Payer: Self-pay | Admitting: Medical Oncology

## 2017-10-10 DIAGNOSIS — R590 Localized enlarged lymph nodes: Secondary | ICD-10-CM | POA: Diagnosis not present

## 2017-10-10 DIAGNOSIS — C3490 Malignant neoplasm of unspecified part of unspecified bronchus or lung: Secondary | ICD-10-CM | POA: Diagnosis not present

## 2017-10-10 DIAGNOSIS — C7931 Secondary malignant neoplasm of brain: Secondary | ICD-10-CM | POA: Diagnosis not present

## 2017-10-10 DIAGNOSIS — Z510 Encounter for antineoplastic radiation therapy: Secondary | ICD-10-CM | POA: Diagnosis not present

## 2017-10-10 DIAGNOSIS — Z86718 Personal history of other venous thrombosis and embolism: Secondary | ICD-10-CM | POA: Diagnosis not present

## 2017-10-10 DIAGNOSIS — Z9181 History of falling: Secondary | ICD-10-CM | POA: Diagnosis not present

## 2017-10-10 NOTE — Telephone Encounter (Signed)
[facility] was not aware of appt.for today an it needs to be r/s.

## 2017-10-11 DIAGNOSIS — J449 Chronic obstructive pulmonary disease, unspecified: Secondary | ICD-10-CM | POA: Diagnosis not present

## 2017-10-11 DIAGNOSIS — C7931 Secondary malignant neoplasm of brain: Secondary | ICD-10-CM | POA: Diagnosis not present

## 2017-10-11 DIAGNOSIS — I482 Chronic atrial fibrillation: Secondary | ICD-10-CM | POA: Diagnosis not present

## 2017-10-11 DIAGNOSIS — C3492 Malignant neoplasm of unspecified part of left bronchus or lung: Secondary | ICD-10-CM | POA: Diagnosis not present

## 2017-10-12 ENCOUNTER — Ambulatory Visit: Payer: Medicare Other | Admitting: Radiation Oncology

## 2017-10-12 DIAGNOSIS — I10 Essential (primary) hypertension: Secondary | ICD-10-CM | POA: Diagnosis not present

## 2017-10-12 DIAGNOSIS — J449 Chronic obstructive pulmonary disease, unspecified: Secondary | ICD-10-CM | POA: Diagnosis not present

## 2017-10-12 DIAGNOSIS — I4891 Unspecified atrial fibrillation: Secondary | ICD-10-CM | POA: Diagnosis not present

## 2017-10-12 DIAGNOSIS — F411 Generalized anxiety disorder: Secondary | ICD-10-CM | POA: Diagnosis not present

## 2017-10-12 DIAGNOSIS — C3490 Malignant neoplasm of unspecified part of unspecified bronchus or lung: Secondary | ICD-10-CM | POA: Diagnosis not present

## 2017-10-12 DIAGNOSIS — F322 Major depressive disorder, single episode, severe without psychotic features: Secondary | ICD-10-CM | POA: Diagnosis not present

## 2017-10-12 DIAGNOSIS — C7931 Secondary malignant neoplasm of brain: Secondary | ICD-10-CM | POA: Diagnosis not present

## 2017-10-12 DIAGNOSIS — E46 Unspecified protein-calorie malnutrition: Secondary | ICD-10-CM | POA: Diagnosis not present

## 2017-10-16 ENCOUNTER — Ambulatory Visit: Payer: Medicare Other | Admitting: Radiation Oncology

## 2017-10-17 ENCOUNTER — Other Ambulatory Visit: Payer: Self-pay | Admitting: Cardiology

## 2017-10-18 ENCOUNTER — Ambulatory Visit: Payer: Medicare Other | Admitting: Radiation Oncology

## 2017-10-24 ENCOUNTER — Inpatient Hospital Stay: Payer: Medicare Other

## 2017-10-24 ENCOUNTER — Ambulatory Visit: Payer: Medicare Other

## 2017-10-24 ENCOUNTER — Inpatient Hospital Stay: Payer: Medicare Other | Admitting: Nurse Practitioner

## 2017-10-24 NOTE — Progress Notes (Deleted)
[place]  Telephone:(336) [phone] Fax:(336) [phone]  Clinic Follow up Note   Patient Care Team: [name], MD as PCP - General (Family Medicine) 10/24/2017   INTERVAL HISTORY: Sarah [name] returns for follow up as scheduled. She was last seen by Dr. Julien Nordmann and received cycle 16 Imfinzi on 09/26/17. She presented to ED on 10/03/17 with generalized weakness and memory problems. MR brain showed an enhancing mass involving the left temporal, parietal, and occipital lobes measuring 6.2x4.2x4.3 cm with mass effect, consistent with solitary metastasis from lung cancer. CT chest was stable. She was seen by Dr. Julien Nordmann inpatient who recommended discontinuation of Imfinzi. Neurosurgery was consulted but the patient declined. She met with rad onc who discussed whole brain radiation vs SRS and recommended she undergo SRS protocol MRI for planning. She was set up to receive palliative radiation as an outpatient.   REVIEW OF SYSTEMS:   Constitutional: Denies fevers, chills or abnormal weight loss Eyes: Denies blurriness of vision Ears, nose, mouth, throat, and face: Denies mucositis or sore throat Respiratory: Denies cough, dyspnea or wheezes Cardiovascular: Denies palpitation, chest discomfort or lower extremity swelling Gastrointestinal:  Denies nausea, heartburn or change in bowel habits Skin: Denies abnormal skin rashes Lymphatics: Denies new lymphadenopathy or easy bruising Neurological:Denies numbness, tingling or new weaknesses Behavioral/Psych: Mood is stable, no new changes  All other systems were reviewed with the patient and are negative.  MEDICAL HISTORY:  Past Medical History:  Diagnosis Date  . Arthritis   . Cancer ([place])    lung cancer  . Chronic anxiety    Patient describes as stress  . Chronic back pain   . DVT (deep venous thrombosis) ([place]) age 15  . GERD (gastroesophageal reflux disease)   . Goals of care, counseling/discussion 09/21/2016  .  Headache(784.0)   . Hypoestrogenism   . Lower extremity venous stasis    LLE, chronic  . Obesity   . Odynophagia 11/13/2016  . Paroxysmal atrial fibrillation (HCC)   . PONV (postoperative nausea and vomiting)   . Pulmonary embolism ([place])   . Reflux   . Sleep apnea    wear CPAP  . Spinal stenosis   . Tobacco abuse   . Venous stasis   . Warfarin anticoagulation     SURGICAL HISTORY: Past Surgical History:  Procedure Laterality Date  . ANKLE SURGERY     x2  . APPENDECTOMY    . CARPAL TUNNEL RELEASE    . CHOLECYSTECTOMY    . IR FLUORO GUIDE PORT INSERTION RIGHT  03/19/2017  . IR US GUIDE VASC ACCESS RIGHT  03/19/2017  . OVARIAN CYST REMOVAL    . ROTATOR CUFF REPAIR    . TUBAL LIGATION    . VIDEO BRONCHOSCOPY WITH ENDOBRONCHIAL ULTRASOUND N/A 09/14/2016   Procedure: VIDEO BRONCHOSCOPY WITH ENDOBRONCHIAL ULTRASOUND;  Surgeon: Melrose Nakayama, MD;  Location: [facility];  Service: Thoracic;  Laterality: N/A;    I have reviewed the social history and family history with the patient and they are unchanged from previous note.  ALLERGIES:  is allergic to aspirin.  MEDICATIONS:  Current Outpatient Medications  Medication Sig Dispense Refill  . albuterol (PROVENTIL HFA;VENTOLIN HFA) 108 (90 Base) MCG/ACT inhaler Inhale 2 puffs into the lungs every 6 (six) hours as needed for wheezing or shortness of breath. 1 Inhaler 5  . budesonide-formoterol (SYMBICORT) 160-4.5 MCG/ACT inhaler Inhale 2 puffs into the lungs 2 (two) times daily. 1 Inhaler 12  . buPROPion (WELLBUTRIN SR) 150 MG 12 hr  tablet Take 1 tablet (150 mg total) by mouth daily. 30 tablet 0  . calcium carbonate (TUMS - DOSED IN MG ELEMENTAL CALCIUM) 500 MG chewable tablet Chew 1 tablet by mouth 2 (two) times a week.     Marland Kitchen dexamethasone (DECADRON) 6 MG tablet Take 0.5 tablets (3 mg total) by mouth 4 (four) times daily. 90 tablet 0  . diltiazem (CARDIZEM CD) 180 MG 24 hr capsule TAKE ONE CAPSULE BY MOUTH EVERY DAY 90 capsule 1  .  feeding supplement, ENSURE ENLIVE, (ENSURE ENLIVE) LIQD Take 237 mLs by mouth 2 (two) times daily between meals. 237 mL 0  . flecainide (TAMBOCOR) 50 MG tablet TAKE 1 TABLET BY MOUTH TWICE A DAY 60 tablet 0  . levETIRAcetam (KEPPRA) 500 MG tablet Take 1 tablet (500 mg total) by mouth 2 (two) times daily. 60 tablet 0  . pantoprazole (PROTONIX) 40 MG tablet Take 1 tablet (40 mg total) by mouth every morning. 90 tablet 1  . promethazine (PHENERGAN) 25 MG tablet TAKE 1 TABLET BY MOUTH EVERY 6 HOURS AS NEEDED FOR NAUSEA/VOMITING 30 tablet 0  . sucralfate (CARAFATE) 1 g tablet Take 1 tablet (1 g total) by mouth 2 (two) times daily. 60 tablet 0  . warfarin (COUMADIN) 5 MG tablet Take 0-2.5 mg by mouth daily. Take 2.5 mg Daily except none on Monday     No current facility-administered medications for this visit.     PHYSICAL EXAMINATION: ECOG PERFORMANCE STATUS: {CHL ONC ECOG [phone]}  There were no vitals filed for this visit. There were no vitals filed for this visit.  GENERAL:alert, no distress and comfortable SKIN: skin color, texture, turgor are normal, no rashes or significant lesions EYES: normal, Conjunctiva are pink and non-injected, sclera clear OROPHARYNX:no exudate, no erythema and lips, buccal mucosa, and tongue normal  NECK: supple, thyroid normal size, non-tender, without nodularity LYMPH:  no palpable lymphadenopathy in the cervical, axillary or inguinal LUNGS: clear to auscultation and percussion with normal breathing effort HEART: regular rate & rhythm and no murmurs and no lower extremity edema ABDOMEN:abdomen soft, non-tender and normal bowel sounds Musculoskeletal:no cyanosis of digits and no clubbing  NEURO: alert & oriented x 3 with fluent speech, no focal motor/sensory deficits  LABORATORY DATA:  I have reviewed the data as listed CBC Latest Ref Rng & Units 10/09/2017 10/08/2017 10/07/2017  WBC 4.0 - 10.5 K/uL 10.9(H) 9.8 12.1(H)  Hemoglobin 12.0 - 15.0 g/dL 14.2  13.6 13.1  Hematocrit 36.0 - 46.0 % 41.8 40.9 39.3  Platelets 150 - 400 K/uL 198 198 191     CMP Latest Ref Rng & Units 10/03/2017 09/26/2017 09/12/2017  Glucose 65 - 99 mg/dL 89 108 89  BUN 6 - 20 mg/dL '7 8 8  '$ Creatinine 0.44 - 1.00 mg/dL 1.01(H) 1.06 0.95  Sodium 135 - 145 mmol/L 142 143 142  Potassium 3.5 - 5.1 mmol/L 3.6 3.9 4.0  Chloride 101 - 111 mmol/L 101 103 104  CO2 22 - 32 mmol/L '31 28 29  '$ Calcium 8.9 - 10.3 mg/dL 9.4 9.7 9.4  Total Protein 6.5 - 8.1 g/dL 7.2 7.4 7.1  Total Bilirubin 0.3 - 1.2 mg/dL 0.5 0.3 0.3  Alkaline Phos 38 - 126 U/L 94 100 99  AST 15 - 41 U/L '21 21 18  '$ ALT 14 - 54 U/L '20 23 18      '$ RADIOGRAPHIC STUDIES: I have personally reviewed the radiological images as listed and agreed with the findings in the report. No results found.  ASSESSMENT & PLAN:  No problem-specific Assessment & Plan notes found for this encounter.   No orders of the defined types were placed in this encounter.  All questions were answered. The patient knows to call the clinic with any problems, questions or concerns. No barriers to learning was detected. I spent {CHL ONC TIME VISIT - DIXVE:[id number]} counseling the patient face to face. The total time spent in the appointment was {CHL ONC TIME VISIT - RKVTX:[id number]} and more than 50% was on counseling and review of test results     Alla Feeling, NP 10/24/17

## 2017-10-25 ENCOUNTER — Telehealth: Payer: Self-pay | Admitting: Internal Medicine

## 2017-10-25 NOTE — Telephone Encounter (Signed)
Scheduled appt per 6/26 sch message - left message with appt date and time. And call back number for any questions.

## 2017-10-30 ENCOUNTER — Ambulatory Visit: Payer: Medicare Other

## 2017-10-31 ENCOUNTER — Ambulatory Visit: Payer: Medicare Other | Admitting: Student

## 2017-10-31 NOTE — Progress Notes (Deleted)
Cardiology Office Note    Date:  10/31/2017   ID:  Sarah [name], DOB 1950-10-27, MRN [id number]  PCP:  [name], MD  Cardiologist: Rozann Lesches, MD    No chief complaint on file.   History of Present Illness:    Sarah [name] is a 67 y.o. female with past medical history of paroxysmal atrial fibrillation, history of PE/DVT, NSCLC (with recently documented brain metastases), and COPD who presents to the office today for hospital follow-up.  She was last examined by Dr. Domenic Polite in 09/2015 and reported overall doing well from a cardiac perspective at that time. Was continued on her current medication regimen including Coumadin for anticoagulation and Flecainide 50mg  BID.   She was recently admitted from 10/03/2017 to 10/09/2017 for evaluation of weakness and memory loss. MRI of the brain was performed upon admission and showed a peripherally enhancing mass lesion involving the medial left temporal, parietal, and occipital lobes which was most consistent with a solitary metastasis from the patient's known lung cancer as outlined in the results from 10/03/2017. Repeat MRI on 6/7 showed a solitary left temporo-occipital brain lesion, likely necrotic metastasis measuring 43 x 62 x 42 mm, significant vasogenic edema, with trapped LEFT temporal horn contributing to LEFT-to-RIGHT shift. She was transferred from [facility] to [facility] where Neurosurgery and Oncology followed the patient. It was felt that the tumor was not curable from a surgical perspective and it was recommended that she continue on Decadron and radiation.  Cardiology was asked to weigh in on continuing anticoagulation in the setting of her newly documented brain metastases and Dr. Harrington Challenger recommended this should come from Neurosurgery. By review of the discharge summary on 6/11, Internal Medicine spoke with Dr. Christella Noa who said it was fine to continue anticoagulation at that time.    Past Medical History:  Diagnosis Date    . Arthritis   . Cancer ([place])    lung cancer  . Chronic anxiety    Patient describes as stress  . Chronic back pain   . DVT (deep venous thrombosis) ([place]) age 77  . GERD (gastroesophageal reflux disease)   . Goals of care, counseling/discussion 09/21/2016  . Headache(784.0)   . Hypoestrogenism   . Lower extremity venous stasis    LLE, chronic  . Obesity   . Odynophagia 11/13/2016  . Paroxysmal atrial fibrillation (HCC)   . PONV (postoperative nausea and vomiting)   . Pulmonary embolism ([place])   . Reflux   . Sleep apnea    wear CPAP  . Spinal stenosis   . Tobacco abuse   . Venous stasis   . Warfarin anticoagulation     Past Surgical History:  Procedure Laterality Date  . ANKLE SURGERY     x2  . APPENDECTOMY    . CARPAL TUNNEL RELEASE    . CHOLECYSTECTOMY    . IR FLUORO GUIDE PORT INSERTION RIGHT  03/19/2017  . IR US GUIDE VASC ACCESS RIGHT  03/19/2017  . OVARIAN CYST REMOVAL    . ROTATOR CUFF REPAIR    . TUBAL LIGATION    . VIDEO BRONCHOSCOPY WITH ENDOBRONCHIAL ULTRASOUND N/A 09/14/2016   Procedure: VIDEO BRONCHOSCOPY WITH ENDOBRONCHIAL ULTRASOUND;  Surgeon: Melrose Nakayama, MD;  Location: [facility] OR;  Service: Thoracic;  Laterality: N/A;    Current Medications: Outpatient Medications Prior to Visit  Medication Sig Dispense Refill  . albuterol (PROVENTIL HFA;VENTOLIN HFA) 108 (90 Base) MCG/ACT inhaler Inhale 2 puffs into the lungs every 6 (six)  hours as needed for wheezing or shortness of breath. 1 Inhaler 5  . budesonide-formoterol (SYMBICORT) 160-4.5 MCG/ACT inhaler Inhale 2 puffs into the lungs 2 (two) times daily. 1 Inhaler 12  . buPROPion (WELLBUTRIN SR) 150 MG 12 hr tablet Take 1 tablet (150 mg total) by mouth daily. 30 tablet 0  . calcium carbonate (TUMS - DOSED IN MG ELEMENTAL CALCIUM) 500 MG chewable tablet Chew 1 tablet by mouth 2 (two) times a week.     Marland Kitchen dexamethasone (DECADRON) 6 MG tablet Take 0.5 tablets (3 mg total) by mouth 4 (four) times daily. 90  tablet 0  . diltiazem (CARDIZEM CD) 180 MG 24 hr capsule TAKE ONE CAPSULE BY MOUTH EVERY DAY 90 capsule 1  . feeding supplement, ENSURE ENLIVE, (ENSURE ENLIVE) LIQD Take 237 mLs by mouth 2 (two) times daily between meals. 237 mL 0  . flecainide (TAMBOCOR) 50 MG tablet TAKE 1 TABLET BY MOUTH TWICE A DAY 60 tablet 0  . levETIRAcetam (KEPPRA) 500 MG tablet Take 1 tablet (500 mg total) by mouth 2 (two) times daily. 60 tablet 0  . pantoprazole (PROTONIX) 40 MG tablet Take 1 tablet (40 mg total) by mouth every morning. 90 tablet 1  . promethazine (PHENERGAN) 25 MG tablet TAKE 1 TABLET BY MOUTH EVERY 6 HOURS AS NEEDED FOR NAUSEA/VOMITING 30 tablet 0  . sucralfate (CARAFATE) 1 g tablet Take 1 tablet (1 g total) by mouth 2 (two) times daily. 60 tablet 0  . warfarin (COUMADIN) 5 MG tablet Take 0-2.5 mg by mouth daily. Take 2.5 mg Daily except none on Monday     No facility-administered medications prior to visit.      Allergies:   Aspirin   Social History   Socioeconomic History  . Marital status: Married    Spouse name: Not on file  . Number of children: Not on file  . Years of education: Not on file  . Highest education level: Not on file  Occupational History  . Occupation: EMT    CommentArt gallery manager  . 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 Financial resource strain: Not on file  . Food insecurity:    Worry: Not on file    Inability: Not on file  . Transportation needs:    Medical: Not on file    Non-medical: Not on file  Tobacco Use  . Smoking status: Former Smoker    Packs/day: 1.00    Years: 40.00    Pack years: 40.00    Types: Cigarettes    Last attempt to quit: 08/29/2016    Years since quitting: 1.1  . Smokeless tobacco: Never Used  Substance and Sexual Activity  . Alcohol use: No    Alcohol/week: 0.0 oz  . Drug use: No  . Sexual activity: Not on file  Lifestyle  . Physical activity:    Days per week: Not on file    Minutes per session: Not on  file  . Stress: Not on file  Relationships  . Social connections:    Talks on phone: Not on file    Gets together: Not on file    Attends religious service: Not on file    Active member of club or organization: Not on file    Attends meetings of clubs or organizations: Not on file    Relationship status: Not on file  Other Topics Concern  . Not on file  Social History Narrative  . Not on file  Family History:  The patient's ***family history includes Arrhythmia in her mother; Cancer in her mother; Heart disease in her mother; Stroke in her father.   Review of Systems:   Please see the history of present illness.     General:  No chills, fever, night sweats or weight changes.  Cardiovascular:  No chest pain, dyspnea on exertion, edema, orthopnea, palpitations, paroxysmal nocturnal dyspnea. Dermatological: No rash, lesions/masses Respiratory: No cough, dyspnea Urologic: No hematuria, dysuria Abdominal:   No nausea, vomiting, diarrhea, bright red blood per rectum, melena, or hematemesis Neurologic:  No visual changes, wkns, changes in mental status. All other systems reviewed and are otherwise negative except as noted above.   Physical Exam:    VS:  There were no vitals taken for this visit.   General: Well developed, well nourished,female appearing in no acute distress. Head: Normocephalic, atraumatic, sclera non-icteric, no xanthomas, nares are without discharge.  Neck: No carotid bruits. JVD not elevated.  Lungs: Respirations regular and unlabored, without wheezes or rales.  Heart: ***Regular rate and rhythm. No S3 or S4.  No murmur, no rubs, or gallops appreciated. Abdomen: Soft, non-tender, non-distended with normoactive bowel sounds. No hepatomegaly. No rebound/guarding. No obvious abdominal masses. Msk:  Strength and tone appear normal for age. No joint deformities or effusions. Extremities: No clubbing or cyanosis. No edema.  Distal pedal pulses are 2+  bilaterally. Neuro: Alert and oriented X 3. Moves all extremities spontaneously. No focal deficits noted. Psych:  Responds to questions appropriately with a normal affect. Skin: No rashes or lesions noted  Wt Readings from Last 3 Encounters:  10/03/17 231 lb 0.7 oz (104.8 kg)  10/01/17 225 lb 9.6 oz (102.3 kg)  09/26/17 230 lb 14.4 oz (104.7 kg)        Studies/Labs Reviewed:   EKG:  EKG is*** ordered today.  The ekg ordered today demonstrates ***  Recent Labs: 03/03/2017: B Natriuretic Peptide 18.0 09/26/2017: TSH 1.607 10/03/2017: ALT 20; BUN 7; Creatinine, Ser 1.01; Potassium 3.6; Sodium 142 10/09/2017: Hemoglobin 14.2; Platelets 198   Lipid Panel    Component Value Date/Time   CHOL 183 07/24/2014 1003   TRIG 138 07/24/2014 1003   HDL 61 07/24/2014 1003   CHOLHDL 3.0 07/24/2014 1003   LDLCALC 94 07/24/2014 1003    Additional studies/ records that were reviewed today include:   Echocardiogram: 11/2011 Study Conclusions  - Left ventricle: The cavity size was normal. Wall thickness was increased in a pattern of mild LVH. Systolic function was vigorous. The estimated ejection fraction was in the range of 75% to 80%. Wall motion was normal; there were no regional wall motion abnormalities. Doppler parameters are consistent with abnormal left ventricular relaxation (grade 1 diastolic dysfunction). - Mitral valve: Trivial regurgitation. - Left atrium: The atrium was mildly dilated. - Right ventricle: Hypertrophy was present. Systolic function was normal. - Tricuspid valve: Trivial regurgitation. - Pulmonary arteries: Systolic pressure could not be accurately estimated. - Pericardium, extracardiac: There was no pericardial effusion.  Assessment:    No diagnosis found.   Plan:   In order of problems listed above:  1. Paroxysmal Atrial Fibrillation/ Use of Long-Term Anticoagulation  2. History of PE/DVT - ***  3. NSCLC  - now with newly  documented mets to the brain. Being followed by Dr. Earlie Server and Dr. Christella Noa.    Medication Adjustments/Labs and Tests Ordered: Current medicines are reviewed at length with the patient today.  Concerns regarding medicines are outlined above.  Medication changes, Labs and  Tests ordered today are listed in the Patient Instructions below. There are no Patient Instructions on file for this visit.   Signed, Erma Heritage, PA-C  10/31/2017 10:35 AM    [facility] [address] [place], [place] [address] Phone: [phone]

## 2017-11-07 ENCOUNTER — Other Ambulatory Visit: Payer: Medicare Other

## 2017-11-07 ENCOUNTER — Ambulatory Visit: Payer: Medicare Other

## 2017-11-07 ENCOUNTER — Ambulatory Visit: Payer: Medicare Other | Admitting: Internal Medicine

## 2017-11-08 NOTE — Progress Notes (Deleted)
[facility] OFFICE PROGRESS NOTE  [name], Sarah [name] [place] [address]   DIAGNOSIS: Unresectable stage IIIA(T4, N0, M0) non-small cell lung cancer, squamous cell carcinoma diagnosed in May 2018 and presented with large obstructing left lower lobe lung mass suspicious small mediastinal lymphadenopathy.  PRIOR THERAPY:  1) A course of concurrent chemoradiation with weekly carboplatin for AUC of 2 and paclitaxel 45 MG/M2. Status post 5 cycles. She received her radiation in [facility]. Last dose of chemotherapy was given 11/27/2016. 2) consolidation immunotherapy with Imfinzi (Durvalumab) 10 MG/KG every 2 weeks, first dose 02/21/2017.  Status post 15 cycles.  CURRENT THERAPY:   INTERVAL HISTORY: Sarah [name] 67 y.o. female returns for *** regular *** visit for followup of ***   MEDICAL HISTORY: Past Medical History:  Diagnosis Date  . Arthritis   . Cancer ([place])    lung cancer  . Chronic anxiety    Patient describes as stress  . Chronic back pain   . DVT (deep venous thrombosis) ([place]) age 56  . GERD (gastroesophageal reflux disease)   . Goals of care, counseling/discussion 09/21/2016  . Headache(784.0)   . Hypoestrogenism   . Lower extremity venous stasis    LLE, chronic  . Obesity   . Odynophagia 11/13/2016  . Paroxysmal atrial fibrillation ([facility])   . PONV (postoperative nausea and vomiting)   . Pulmonary embolism ([place])   . Reflux   . Sleep apnea    wear CPAP  . Spinal stenosis   . Tobacco abuse   . Venous stasis   . Warfarin anticoagulation     ALLERGIES:  is allergic to aspirin.  MEDICATIONS:  Current Outpatient Medications  Medication Sig Dispense Refill  . albuterol (PROVENTIL HFA;VENTOLIN HFA) 108 (90 Base) MCG/ACT inhaler Inhale 2 puffs into the lungs every 6 (six) hours as needed for wheezing or shortness of breath. 1 Inhaler 5  . budesonide-formoterol (SYMBICORT) 160-4.5 MCG/ACT inhaler Inhale 2 puffs  into the lungs 2 (two) times daily. 1 Inhaler 12  . buPROPion (WELLBUTRIN SR) 150 MG 12 hr tablet Take 1 tablet (150 mg total) by mouth daily. 30 tablet 0  . calcium carbonate (TUMS - DOSED IN MG ELEMENTAL CALCIUM) 500 MG chewable tablet Chew 1 tablet by mouth 2 (two) times a week.     Marland Kitchen dexamethasone (DECADRON) 6 MG tablet Take 0.5 tablets (3 mg total) by mouth 4 (four) times daily. 90 tablet 0  . diltiazem (CARDIZEM CD) 180 MG 24 hr capsule TAKE ONE CAPSULE BY MOUTH EVERY DAY 90 capsule 1  . feeding supplement, ENSURE ENLIVE, (ENSURE ENLIVE) LIQD Take 237 mLs by mouth 2 (two) times daily between meals. 237 mL 0  . flecainide (TAMBOCOR) 50 MG tablet TAKE 1 TABLET BY MOUTH TWICE A DAY 60 tablet 0  . levETIRAcetam (KEPPRA) 500 MG tablet Take 1 tablet (500 mg total) by mouth 2 (two) times daily. 60 tablet 0  . pantoprazole (PROTONIX) 40 MG tablet Take 1 tablet (40 mg total) by mouth every morning. 90 tablet 1  . promethazine (PHENERGAN) 25 MG tablet TAKE 1 TABLET BY MOUTH EVERY 6 HOURS AS NEEDED FOR NAUSEA/VOMITING 30 tablet 0  . sucralfate (CARAFATE) 1 g tablet Take 1 tablet (1 g total) by mouth 2 (two) times daily. 60 tablet 0  . warfarin (COUMADIN) 5 MG tablet Take 0-2.5 mg by mouth daily. Take 2.5 mg Daily except none on Monday     No current facility-administered medications  for this visit.     SURGICAL HISTORY:  Past Surgical History:  Procedure Laterality Date  . ANKLE SURGERY     x2  . APPENDECTOMY    . CARPAL TUNNEL RELEASE    . CHOLECYSTECTOMY    . IR FLUORO GUIDE PORT INSERTION RIGHT  03/19/2017  . IR US GUIDE VASC ACCESS RIGHT  03/19/2017  . OVARIAN CYST REMOVAL    . ROTATOR CUFF REPAIR    . TUBAL LIGATION    . VIDEO BRONCHOSCOPY WITH ENDOBRONCHIAL ULTRASOUND N/A 09/14/2016   Procedure: VIDEO BRONCHOSCOPY WITH ENDOBRONCHIAL ULTRASOUND;  Surgeon: Melrose Nakayama, [name];  Location: [facility] OR;  Service: Thoracic;  Laterality: N/A;    REVIEW OF SYSTEMS:   Review of Systems   Constitutional: Negative for appetite change, chills, fatigue, fever and unexpected weight change.  HENT:   Negative for mouth sores, nosebleeds, sore throat and trouble swallowing.   Eyes: Negative for eye problems and icterus.  Respiratory: Negative for cough, hemoptysis, shortness of breath and wheezing.   Cardiovascular: Negative for chest pain and leg swelling.  Gastrointestinal: Negative for abdominal pain, constipation, diarrhea, nausea and vomiting.  Genitourinary: Negative for bladder incontinence, difficulty urinating, dysuria, frequency and hematuria.   Musculoskeletal: Negative for back pain, gait problem, neck pain and neck stiffness.  Skin: Negative for itching and rash.  Neurological: Negative for dizziness, extremity weakness, gait problem, headaches, light-headedness and seizures.  Hematological: Negative for adenopathy. Does not bruise/bleed easily.  Psychiatric/Behavioral: Negative for confusion, depression and sleep disturbance. The patient is not nervous/anxious.     PHYSICAL EXAMINATION:  There were no vitals taken for this visit.  ECOG PERFORMANCE STATUS: {CHL ONC ECOG [id number]  Physical Exam  Constitutional: Oriented to person, place, and time and well-developed, well-nourished, and in no distress. No distress.  HENT:  Head: Normocephalic and atraumatic.  Mouth/Throat: Oropharynx is clear and moist. No oropharyngeal exudate.  Eyes: Conjunctivae are normal. Right eye exhibits no discharge. Left eye exhibits no discharge. No scleral icterus.  Neck: Normal range of motion. Neck supple.  Cardiovascular: Normal rate, regular rhythm, normal heart sounds and intact distal pulses.   Pulmonary/Chest: Effort normal and breath sounds normal. No respiratory distress. No wheezes. No rales.  Abdominal: Soft. Bowel sounds are normal. Exhibits no distension and no mass. There is no tenderness.  Musculoskeletal: Normal range of motion. Exhibits no edema.  Lymphadenopathy:     No cervical adenopathy.  Neurological: Alert and oriented to person, place, and time. Exhibits normal muscle tone. Gait normal. Coordination normal.  Skin: Skin is warm and dry. No rash noted. Not diaphoretic. No erythema. No pallor.  Psychiatric: Mood, memory and judgment normal.  Vitals reviewed.  LABORATORY DATA: Lab Results  Component Value Date   WBC 10.9 (H) 10/09/2017   HGB 14.2 10/09/2017   HCT 41.8 10/09/2017   MCV 92.3 10/09/2017   PLT 198 10/09/2017      Chemistry      Component Value Date/Time   NA 142 10/03/2017 1530   NA 142 04/26/2017 1106   K 3.6 10/03/2017 1530   K 3.4 (L) 04/26/2017 1106   CL 101 10/03/2017 1530   CO2 31 10/03/2017 1530   CO2 29 04/26/2017 1106   BUN 7 10/03/2017 1530   BUN 7.5 04/26/2017 1106   CREATININE 1.01 (H) 10/03/2017 1530   CREATININE 0.9 04/26/2017 1106      Component Value Date/Time   CALCIUM 9.4 10/03/2017 1530   CALCIUM 9.1 04/26/2017 1106   ALKPHOS 94  10/03/2017 1530   ALKPHOS 99 04/26/2017 1106   AST 21 10/03/2017 1530   AST 14 04/26/2017 1106   ALT 20 10/03/2017 1530   ALT 12 04/26/2017 1106   BILITOT 0.5 10/03/2017 1530   BILITOT 0.28 04/26/2017 1106       RADIOGRAPHIC STUDIES:  No results found.   ASSESSMENT/PLAN:  No problem-specific Assessment & Plan notes found for this encounter.  No orders of the defined types were placed in this encounter.  Mikey Bussing, DNP, AGPCNP-BC, AOCNP 11/08/17

## 2017-11-08 NOTE — Assessment & Plan Note (Deleted)
This is a very pleasant 67 years old with unresectable a stage IIIA non-small cell lung cancer. The patient underwent a course of concurrent chemoradiation with weekly carboplatin and paclitaxel status post 8 cycles and has been tolerating this treatment fairly well except for mild fatigue and odynophagia. The patient is currently undergoing consolidation immunotherapy with Imfinzi (Durvalumab) status post 15 cycles. The patient continues to tolerate her treatment well with no concerning complaints.  I recommended for her to proceed with cycle #16 today as scheduled. For the memory loss, I will order MRI of the brain to rule out any brain metastasis or stroke. The patient will come back for follow-up visit in 2 weeks for reevaluation before the next cycle of her treatment. She was advised to call immediately if she has any concerning symptoms in the interval. The patient voices understanding of current disease status and treatment options and is in agreement with the current care plan. All questions were answered. The patient knows to call the clinic with any problems, questions or concerns. We can certainly see the patient much sooner if necessary.

## 2017-11-12 ENCOUNTER — Other Ambulatory Visit: Payer: Medicare Other

## 2017-11-12 ENCOUNTER — Ambulatory Visit: Payer: Medicare Other | Admitting: Oncology

## 2017-11-21 NOTE — Progress Notes (Signed)
  Radiation Oncology         [phone]) [phone] ________________________________  Name: [name] MRN: [id number]  Date: 10/08/2017  DOB: [birth year]  DIAGNOSIS:     ICD-10-CM   1. Brain metastasis ([place]) C79.31     NARRATIVE:  The patient was brought to the [facility].  Identity was confirmed.  All relevant records and images related to the planned course of therapy were reviewed.  The patient freely provided informed written consent to proceed with treatment after reviewing the details related to the planned course of therapy. The consent form was witnessed and verified by the simulation staff. Intravenous access was established for contrast administration. Then, the patient was set-up in a stable reproducible supine position for radiation therapy.  A relocatable thermoplastic stereotactic head frame was fabricated for precise immobilization.  CT images were obtained.  Surface markings were placed.  The CT images were loaded into the planning software and fused with the patient's targeting MRI scan.  Then the target and avoidance structures were contoured.  Treatment planning then occurred.  The radiation prescription was entered and confirmed.  I have requested 3D planning  I have requested a DVH of the following structures: Brain stem, brain, left eye, right eye, lenses, optic chiasm, target volumes, uninvolved brain, and normal tissue.    SPECIAL TREATMENT PROCEDURE:  The planned course of therapy using radiation constitutes a special treatment procedure. Special care is required in the management of this patient for the following reasons. This treatment constitutes a Special Treatment Procedure for the following reason: High dose per fraction requiring special monitoring for increased toxicities of treatment including daily imaging.  The special nature of the planned course of radiotherapy will require increased physician supervision and oversight to ensure patient's safety with  optimal treatment outcomes.  PLAN:  The patient will receive 27 Gy in 3 fraction.   ------------------------------------------------  Jodelle Gross, MD, PhD

## 2017-11-29 DEATH — deceased
